# Patient Record
Sex: Female | Born: 1960 | Race: White | Hispanic: No | Marital: Married | Smoking: Never smoker
Health system: Southern US, Community
[De-identification: ages and names within clinical notes are randomized; demographics above are authoritative.]

## PROBLEM LIST (undated history)

## (undated) DIAGNOSIS — L509 Urticaria, unspecified: Secondary | ICD-10-CM

## (undated) DIAGNOSIS — G4733 Obstructive sleep apnea (adult) (pediatric): Secondary | ICD-10-CM

## (undated) DIAGNOSIS — F32A Depression, unspecified: Secondary | ICD-10-CM

## (undated) DIAGNOSIS — E785 Hyperlipidemia, unspecified: Secondary | ICD-10-CM

## (undated) DIAGNOSIS — E079 Disorder of thyroid, unspecified: Secondary | ICD-10-CM

## (undated) DIAGNOSIS — M419 Scoliosis, unspecified: Secondary | ICD-10-CM

## (undated) DIAGNOSIS — F319 Bipolar disorder, unspecified: Secondary | ICD-10-CM

## (undated) DIAGNOSIS — Z8739 Personal history of other diseases of the musculoskeletal system and connective tissue: Secondary | ICD-10-CM

## (undated) DIAGNOSIS — K219 Gastro-esophageal reflux disease without esophagitis: Secondary | ICD-10-CM

## (undated) DIAGNOSIS — M1990 Unspecified osteoarthritis, unspecified site: Secondary | ICD-10-CM

## (undated) DIAGNOSIS — E559 Vitamin D deficiency, unspecified: Secondary | ICD-10-CM

## (undated) DIAGNOSIS — C801 Malignant (primary) neoplasm, unspecified: Secondary | ICD-10-CM

## (undated) DIAGNOSIS — T7840XA Allergy, unspecified, initial encounter: Secondary | ICD-10-CM

## (undated) DIAGNOSIS — M8580 Other specified disorders of bone density and structure, unspecified site: Secondary | ICD-10-CM

## (undated) HISTORY — DX: Other specified disorders of bone density and structure, unspecified site: M85.80

## (undated) HISTORY — DX: Obstructive sleep apnea (adult) (pediatric): G47.33

## (undated) HISTORY — DX: Unspecified osteoarthritis, unspecified site: M19.90

## (undated) HISTORY — DX: Hyperlipidemia, unspecified: E78.5

## (undated) HISTORY — DX: Gastro-esophageal reflux disease without esophagitis: K21.9

## (undated) HISTORY — DX: Scoliosis, unspecified: M41.9

## (undated) HISTORY — DX: Depression, unspecified: F32.A

## (undated) HISTORY — DX: Personal history of other diseases of the musculoskeletal system and connective tissue: Z87.39

## (undated) HISTORY — PX: ENDOSCOPIC PLANTAR FASCIOTOMY: SUR443

## (undated) HISTORY — DX: Disorder of thyroid, unspecified: E07.9

## (undated) HISTORY — DX: Urticaria, unspecified: L50.9

## (undated) HISTORY — DX: Allergy, unspecified, initial encounter: T78.40XA

## (undated) HISTORY — DX: Vitamin D deficiency, unspecified: E55.9

## (undated) HISTORY — PX: BREAST BIOPSY: SHX20

## (undated) HISTORY — DX: Bipolar disorder, unspecified: F31.9

## (undated) HISTORY — PX: WISDOM TOOTH EXTRACTION: SHX21

## (undated) HISTORY — PX: LYMPH NODE BIOPSY: SHX201

## (undated) HISTORY — DX: Malignant (primary) neoplasm, unspecified: C80.1

---

## 2000-05-27 HISTORY — PX: CARPAL TUNNEL RELEASE: SHX101

## 2004-05-30 ENCOUNTER — Ambulatory Visit (HOSPITAL_BASED_OUTPATIENT_CLINIC_OR_DEPARTMENT_OTHER): Admission: RE | Admit: 2004-05-30 | Discharge: 2004-05-30 | Payer: Self-pay | Admitting: Orthopedic Surgery

## 2008-07-26 HISTORY — PX: COLONOSCOPY: SHX174

## 2013-03-17 HISTORY — PX: ESOPHAGOGASTRODUODENOSCOPY: SHX1529

## 2015-10-31 DIAGNOSIS — C9110 Chronic lymphocytic leukemia of B-cell type not having achieved remission: Secondary | ICD-10-CM | POA: Insufficient documentation

## 2015-11-29 DIAGNOSIS — Z803 Family history of malignant neoplasm of breast: Secondary | ICD-10-CM

## 2015-11-29 DIAGNOSIS — Z807 Family history of other malignant neoplasms of lymphoid, hematopoietic and related tissues: Secondary | ICD-10-CM

## 2015-11-29 DIAGNOSIS — C8300 Small cell B-cell lymphoma, unspecified site: Secondary | ICD-10-CM

## 2015-11-29 DIAGNOSIS — Z8042 Family history of malignant neoplasm of prostate: Secondary | ICD-10-CM

## 2015-11-29 DIAGNOSIS — Z808 Family history of malignant neoplasm of other organs or systems: Secondary | ICD-10-CM

## 2015-12-06 DIAGNOSIS — C8302 Small cell B-cell lymphoma, intrathoracic lymph nodes: Secondary | ICD-10-CM | POA: Diagnosis not present

## 2016-03-08 DIAGNOSIS — C8302 Small cell B-cell lymphoma, intrathoracic lymph nodes: Secondary | ICD-10-CM | POA: Diagnosis not present

## 2016-03-08 DIAGNOSIS — B373 Candidiasis of vulva and vagina: Secondary | ICD-10-CM

## 2016-03-08 DIAGNOSIS — Z87440 Personal history of urinary (tract) infections: Secondary | ICD-10-CM | POA: Diagnosis not present

## 2016-06-10 DIAGNOSIS — C8302 Small cell B-cell lymphoma, intrathoracic lymph nodes: Secondary | ICD-10-CM | POA: Diagnosis not present

## 2017-01-24 DIAGNOSIS — C8302 Small cell B-cell lymphoma, intrathoracic lymph nodes: Secondary | ICD-10-CM | POA: Diagnosis not present

## 2017-01-24 DIAGNOSIS — Z853 Personal history of malignant neoplasm of breast: Secondary | ICD-10-CM | POA: Diagnosis not present

## 2017-06-20 ENCOUNTER — Other Ambulatory Visit: Payer: Self-pay | Admitting: Internal Medicine

## 2017-06-20 DIAGNOSIS — R748 Abnormal levels of other serum enzymes: Secondary | ICD-10-CM

## 2017-06-25 ENCOUNTER — Ambulatory Visit
Admission: RE | Admit: 2017-06-25 | Discharge: 2017-06-25 | Disposition: A | Discharge status: Home / Self Care | Payer: 59 | Source: Ambulatory Visit | Attending: Internal Medicine | Admitting: Internal Medicine

## 2017-06-25 DIAGNOSIS — R748 Abnormal levels of other serum enzymes: Secondary | ICD-10-CM

## 2017-10-10 DIAGNOSIS — R945 Abnormal results of liver function studies: Secondary | ICD-10-CM | POA: Diagnosis not present

## 2017-10-10 DIAGNOSIS — Z803 Family history of malignant neoplasm of breast: Secondary | ICD-10-CM | POA: Diagnosis not present

## 2017-10-10 DIAGNOSIS — C8302 Small cell B-cell lymphoma, intrathoracic lymph nodes: Secondary | ICD-10-CM | POA: Diagnosis not present

## 2018-04-08 DIAGNOSIS — C8302 Small cell B-cell lymphoma, intrathoracic lymph nodes: Secondary | ICD-10-CM

## 2018-04-08 DIAGNOSIS — R945 Abnormal results of liver function studies: Secondary | ICD-10-CM

## 2018-04-08 DIAGNOSIS — D72819 Decreased white blood cell count, unspecified: Secondary | ICD-10-CM

## 2018-04-08 DIAGNOSIS — Z1501 Genetic susceptibility to malignant neoplasm of breast: Secondary | ICD-10-CM

## 2018-10-28 DIAGNOSIS — C8302 Small cell B-cell lymphoma, intrathoracic lymph nodes: Secondary | ICD-10-CM

## 2019-09-30 DIAGNOSIS — C8302 Small cell B-cell lymphoma, intrathoracic lymph nodes: Secondary | ICD-10-CM

## 2020-01-13 DIAGNOSIS — C8302 Small cell B-cell lymphoma, intrathoracic lymph nodes: Secondary | ICD-10-CM

## 2020-05-11 ENCOUNTER — Telehealth: Payer: Self-pay | Admitting: Oncology

## 2020-05-11 NOTE — Telephone Encounter (Signed)
05/11/20 spoke with patient and rescheduled her appt.

## 2020-06-14 ENCOUNTER — Other Ambulatory Visit: Payer: 59

## 2020-06-14 ENCOUNTER — Ambulatory Visit: Payer: 59 | Admitting: Oncology

## 2020-06-16 NOTE — Progress Notes (Signed)
[facility]  [address] [place],  [place]  [address] [phone]  Clinic Day:  06/20/2020  Referring physician: Ernestene Kiel, MD   This document serves as a record of services personally performed by Hosie Poisson, MD. It was created on their behalf by Curry,Lauren E, a trained medical scribe. The creation of this record is based on the scribe's personal observations and the provider's statements to them.   CHIEF COMPLAINT:  CC: Clinical stage IIIB small lymphocytic lymphoma  Current Treatment:  Surveillance   HISTORY OF PRESENT ILLNESS:  Dawn Prince is a 60 y.o. 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  Staging CT chest revealed adenopathy of the bilateral neck measuring up to 18 mm in diameter with left subclavian, bilateral axillary and subpectoral adenopathy, but no mediastinal nodes.  CT abdomen was  negative.  CT pelvis revealed bilateral external iliac nodes up to 11 mm in diameter.  She had B symptoms with severe night sweats and weight loss.  She has been on  observation only.  Due to her family history of breast cancer, she underwent testing for hereditary breast and ovarian cancer with the [facility] myRisk Hereditary Cancer Gene panel test.  This did not reveal any clinically significant mutation.  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  Her [facility] breast cancer risk assessment showed her lifetime risk of breast cancer to be 29.7%, so annual breast MRI, in addition to mammogram is recommended.  Mammogram and MRI breast done in August 2019 did not reveal any evidence of malignancy.  She did go for a second opinion to [facility] regarding her lymphoma and they concurred with the approach of watchful waiting.   She presented to the emergency room in mid April 2021 due to increased urinary frequency and discomfort.  CT imaging revealed left sided  obstructive uropathy with 3 mm calculus in the distal left ureter just proximal to the ureterovesical unction causing moderate hydroureteronephrosis and perinephric stranding.  Left greater than right iliac and pelvic lymphadenopathy, slightly greater than on 10/26/18, consistent with known history of lymphoma.  We did not repeat a scan in June as scheduled.   She is here for routine follow up and states that she lost her father back in July, from metastatic melanoma.  Around that time, she started to experience numbness from her hips down.  This improved, but she continued to have numbness of her feet for few weeks following.  Now she has constant pain of her hips, and has been using Ibuprofen to treat.  She will follow up with her primary care provider.  Recently her mother, who has dementia, had a fall and broke her kneecap, so she has been caring for her.  She plans to move her mother in with her in the next couple of months.  She is managing her anxiety and depression.  She does note fatigue.  Annual screening bilateral mammogram from August 17th was clear.  Her white count has increased from 3.0 to 3.6 with an [place] of 1910, and her hemoglobin and platelets are normal.  Chemistries are unremarkable except for a BUN of 21.  Her appetite is good, and she has gained 2 pounds since her last visit.  She denies fever or chills.  She denies nausea, vomiting, bowel issues, or abdominal pain.  She denies sore throat, cough, dyspnea, or chest pain.  INTERVAL HISTORY:  Dawn Prince is here  for routine follow up and states that she has been well.  She does feel that the adenopathy of the left neck has mildly worsened, but otherwise is stable.  She continues to have night sweats and notes chronic fatigue.  Her white count is stable at 3.6, but the [place] has mildly worsened to 1690, and the lymphocyte percentage has also increased to 45%.  Her hemoglobin and platelets are normal.  Chemistries are unremarkable except for a  mildly elevated potassium of 5.3.  Her  appetite is good, and her weight is stable since her last visit.  She denies fever, chills or other signs of infection.  She denies nausea, vomiting, bowel issues, or abdominal pain.  She denies sore throat, cough, dyspnea, or chest pain.  REVIEW OF SYSTEMS:  Review of Systems  Constitutional: Positive for fatigue.  HENT:  Negative.   Eyes: Negative.   Respiratory: Negative.   Cardiovascular: Negative.   Gastrointestinal: Negative.   Endocrine:       Night sweats  Genitourinary: Negative.    Musculoskeletal: Negative.   Skin: Negative.   Neurological: Negative.   Hematological: Positive for adenopathy (left neck lymph nodes have mildly increased).  Psychiatric/Behavioral: Negative.      VITALS:  Blood pressure 129/82, pulse 83, temperature 98.4 F (36.9 C), resp. rate 16, height 5' 1"  (1.549 m), weight 139 lb 11.2 oz (63.4 kg), SpO2 96 %.  Wt Readings from Last 3 Encounters:  06/20/20 139 lb 11.2 oz (63.4 kg)    Body mass index is 26.4 kg/m.  Performance status (ECOG): 1 - Symptomatic but completely ambulatory  PHYSICAL EXAM:  Physical Exam Constitutional:      General: She is not in acute distress.    Appearance: Normal appearance. She is normal weight.  HENT:     Head: Normocephalic and atraumatic.  Eyes:     General: No scleral icterus.    Extraocular Movements: Extraocular movements intact.     Conjunctiva/sclera: Conjunctivae normal.     Pupils: Pupils are equal, round, and reactive to light.  Cardiovascular:     Rate and Rhythm: Normal rate and regular rhythm.     Pulses: Normal pulses.     Heart sounds: Normal heart sounds. No murmur heard. No friction rub. No gallop.   Pulmonary:     Effort: Pulmonary effort is normal. No respiratory distress.     Breath sounds: Normal breath sounds.  Abdominal:     General: Bowel sounds are normal. There is no distension.     Palpations: Abdomen is soft. There is no mass.      Tenderness: There is no abdominal tenderness.  Musculoskeletal:        General: Normal range of motion.     Cervical back: Normal range of motion and neck supple.     Right lower leg: No edema.     Left lower leg: No edema.  Lymphadenopathy:     Cervical: No cervical adenopathy.     Comments: Several small rubbery lymph noes in the left supraclavicular fossa up to 2 cm in diameter.  Posterior cervical on the left is stable at 1.5 cm.  Left inguinal adenopathy several cm long.   Skin:    General: Skin is warm and dry.  Neurological:     General: No focal deficit present.     Mental Status: She is alert and oriented to person, place, and time. Mental status is at baseline.  Psychiatric:        Mood  and Affect: Mood normal.        Behavior: Behavior normal.        Thought Content: Thought content normal.        Judgment: Judgment normal.     LABS:  No flowsheet data found. No flowsheet data found.   No results found for: LDH   STUDIES:  No results found.   Allergies: Not on File  Current Medications: No current outpatient medications on file.   No current facility-administered medications for this visit.     ASSESSMENT & PLAN:   Assessment:   1. Small lymphocytic low-grade lymphoma, diagnosed in June 2017.  She remains fairly stable on observation only.  We do not recommend treatment at this time as she remains largely asymptomatic.  2. Borderline leukopenia, which fluctuates up and down. B12 and folate levels were both normal.  3. Elevated risk of breast cancer over 29% due to personal and family history.  We have recommended annual MRI breasts in addition to annual mammography, but we will hold off for now in view of her other circumstances.  4. Variant of uncertain significance of RAD51C.  We received an amended report from [employer] reclassifying this variant of uncertain significance as not pathogenic, i.e. not shown to increase her risk of  cancer, so this will no longer need to be followed.    5.  Mildly elevated potassium, I gave her a list of foods to avoid.  Plan: Her lymphadenopathy is fairly stable with mild increase of the left neck adenopathy and she remains asymptomatic, she will not require treatment at this time.  However, her lymphocytes have increased now at 45%, and so she may be transitioning to a CLL.  Treatment was mentioned for both diseases, but the plan would depend on her diagnosis.  She is still likely many years away from requiring treatment.  She knows to cut back on potassium rich foods, and I gave her a list of foods to avoid.  We will see her back in 4 months with CBC, CMP, LDH, and CT neck, chest, abdomen and pelvis for examination.  The patient and her husband understand the plans discussed today and are in agreement with them.  They know to contact our office if she develops concerns regarding her lymphoma prior to her next appointment.     I provided 30 minutes of face-to-face time during this this encounter and > 50% was spent counseling as documented under my assessment and plan.    Derwood Kaplan, MD [facility] [address] [place] [address] Dept: [phone] Dept Fax: [phone]   I, Rita Ohara, am acting as scribe for Derwood Kaplan, MD  I have reviewed this report as typed by the medical scribe, and it is complete and accurate.

## 2020-06-20 ENCOUNTER — Inpatient Hospital Stay (INDEPENDENT_AMBULATORY_CARE_PROVIDER_SITE_OTHER): Payer: 59 | Admitting: Oncology

## 2020-06-20 ENCOUNTER — Encounter: Payer: Self-pay | Admitting: Oncology

## 2020-06-20 ENCOUNTER — Other Ambulatory Visit: Payer: Self-pay | Admitting: Hematology and Oncology

## 2020-06-20 ENCOUNTER — Inpatient Hospital Stay: Payer: 59 | Attending: Oncology

## 2020-06-20 ENCOUNTER — Other Ambulatory Visit: Payer: Self-pay

## 2020-06-20 ENCOUNTER — Telehealth: Payer: Self-pay | Admitting: Oncology

## 2020-06-20 DIAGNOSIS — M5430 Sciatica, unspecified side: Secondary | ICD-10-CM

## 2020-06-20 DIAGNOSIS — E785 Hyperlipidemia, unspecified: Secondary | ICD-10-CM | POA: Insufficient documentation

## 2020-06-20 DIAGNOSIS — E559 Vitamin D deficiency, unspecified: Secondary | ICD-10-CM | POA: Insufficient documentation

## 2020-06-20 DIAGNOSIS — K219 Gastro-esophageal reflux disease without esophagitis: Secondary | ICD-10-CM | POA: Diagnosis not present

## 2020-06-20 DIAGNOSIS — Z8739 Personal history of other diseases of the musculoskeletal system and connective tissue: Secondary | ICD-10-CM | POA: Insufficient documentation

## 2020-06-20 DIAGNOSIS — C8302 Small cell B-cell lymphoma, intrathoracic lymph nodes: Secondary | ICD-10-CM | POA: Diagnosis not present

## 2020-06-20 DIAGNOSIS — C8299 Follicular lymphoma, unspecified, extranodal and solid organ sites: Secondary | ICD-10-CM

## 2020-06-20 DIAGNOSIS — E039 Hypothyroidism, unspecified: Secondary | ICD-10-CM | POA: Insufficient documentation

## 2020-06-20 DIAGNOSIS — M8580 Other specified disorders of bone density and structure, unspecified site: Secondary | ICD-10-CM | POA: Insufficient documentation

## 2020-06-20 LAB — CBC
MCV: 100 — AB (ref 81–99)
RBC: 6.7 — AB (ref 3.87–5.11)

## 2020-06-20 LAB — BASIC METABOLIC PANEL
BUN: 17 (ref 4–21)
CO2: 29 — AB (ref 13–22)
Chloride: 106 (ref 99–108)
Creatinine: 0.9 (ref 0.5–1.1)
Glucose: 111
Potassium: 5.3 (ref 3.4–5.3)
Sodium: 140 (ref 137–147)

## 2020-06-20 LAB — CBC AND DIFFERENTIAL
HCT: 37 (ref 36–46)
Hemoglobin: 12.4 (ref 12.0–16.0)
Neutrophils Absolute: 1.69
Platelets: 218 (ref 150–399)
WBC: 3.6

## 2020-06-20 LAB — COMPREHENSIVE METABOLIC PANEL
Albumin: 4 (ref 3.5–5.0)
Calcium: 9.2 (ref 8.7–10.7)

## 2020-06-20 LAB — HEPATIC FUNCTION PANEL
ALT: 31 (ref 7–35)
AST: 32 (ref 13–35)
Alkaline Phosphatase: 61 (ref 25–125)
Bilirubin, Total: 0.3

## 2020-06-20 NOTE — Telephone Encounter (Signed)
Per 1/25 LOS, patient's Follow Up scheduled for May w/Melissa  Pt will be scheduled for Labs, CT Scans after PreAuthorization received in Apt  Gave patient May Calendar of Follow Up Appt

## 2020-06-21 ENCOUNTER — Telehealth: Payer: Self-pay | Admitting: *Deleted

## 2020-06-21 NOTE — Telephone Encounter (Signed)
Pt received all pfizer vaccines at [facility] the pt had 1st dose on 07-29-19, 2nd dose on 08-17-2019 and booster on 03-30-20

## 2020-08-09 ENCOUNTER — Encounter: Payer: Self-pay | Admitting: Gastroenterology

## 2020-08-21 ENCOUNTER — Ambulatory Visit (AMBULATORY_SURGERY_CENTER): Payer: Self-pay

## 2020-08-21 ENCOUNTER — Other Ambulatory Visit: Payer: Self-pay

## 2020-08-21 VITALS — Ht 61.0 in | Wt 135.0 lb

## 2020-08-21 DIAGNOSIS — Z1211 Encounter for screening for malignant neoplasm of colon: Secondary | ICD-10-CM

## 2020-08-21 MED ORDER — NA SULFATE-K SULFATE-MG SULF 17.5-3.13-1.6 GM/177ML PO SOLN: 1.0000 | Freq: Once | ORAL | 0 refills | Status: AC

## 2020-08-21 NOTE — Progress Notes (Signed)
No allergies to soy or egg Pt is not on blood thinners or diet pills Denies issues with sedation/intubation Denies atrial flutter/fib Denies constipation   Pt is aware of Covid safety and care partner requirements.      

## 2020-09-05 ENCOUNTER — Encounter: Payer: 59 | Admitting: Gastroenterology

## 2020-09-28 ENCOUNTER — Other Ambulatory Visit: Payer: Self-pay | Admitting: Hematology and Oncology

## 2020-09-28 DIAGNOSIS — C8302 Small cell B-cell lymphoma, intrathoracic lymph nodes: Secondary | ICD-10-CM

## 2020-10-02 ENCOUNTER — Encounter: Payer: Self-pay | Admitting: Gastroenterology

## 2020-10-03 ENCOUNTER — Encounter: Payer: Self-pay | Admitting: Gastroenterology

## 2020-10-03 ENCOUNTER — Other Ambulatory Visit: Payer: Self-pay | Admitting: Gastroenterology

## 2020-10-03 ENCOUNTER — Other Ambulatory Visit: Payer: Self-pay

## 2020-10-03 ENCOUNTER — Ambulatory Visit (AMBULATORY_SURGERY_CENTER): Payer: Managed Care, Other (non HMO) | Admitting: Gastroenterology

## 2020-10-03 VITALS — BP 144/79 | HR 73 | Temp 97.6°F | Resp 17 | Ht 61.0 in | Wt 135.0 lb

## 2020-10-03 DIAGNOSIS — Z1211 Encounter for screening for malignant neoplasm of colon: Secondary | ICD-10-CM | POA: Diagnosis present

## 2020-10-03 DIAGNOSIS — D124 Benign neoplasm of descending colon: Secondary | ICD-10-CM

## 2020-10-03 MED ORDER — SODIUM CHLORIDE 0.9 % IV SOLN: 500.0000 mL | Freq: Once | INTRAVENOUS | Status: DC

## 2020-10-03 NOTE — Progress Notes (Signed)
VS by CW  Pt's states no medical or surgical changes since previsit or office visit.  

## 2020-10-03 NOTE — Progress Notes (Signed)
Called to room to assist during endoscopic procedure.  Patient ID and intended procedure confirmed with present staff. Received instructions for my participation in the procedure from the performing physician.  

## 2020-10-03 NOTE — Op Note (Signed)
[facility] Patient Name: Dawn Prince Procedure Date: 10/03/2020 1:56 PM MRN: [id number] Endoscopist: Jackquline Denmark , MD Age: 60 Referring MD:  Date of Birth: 04-22-1961 Gender: Female Account #: [id number] Procedure:                Colonoscopy Indications:              Screening for colorectal malignant neoplasm Medicines:                Monitored Anesthesia Care Procedure:                Pre-Anesthesia Assessment:                           - Prior to the procedure, a History and Physical                            was performed, and patient medications and                            allergies were reviewed. The patient's tolerance of                            previous anesthesia was also reviewed. The risks                            and benefits of the procedure and the sedation                            options and risks were discussed with the patient.                            All questions were answered, and informed consent                            was obtained. Prior Anticoagulants: The patient has                            taken no previous anticoagulant or antiplatelet                            agents. ASA Grade Assessment: II - A patient with                            mild systemic disease. After reviewing the risks                            and benefits, the patient was deemed in                            satisfactory condition to undergo the procedure.                           After obtaining informed consent, the colonoscope  was passed under direct vision. Throughout the                            procedure, the patient's blood pressure, pulse, and                            oxygen saturations were monitored continuously. The                            Olympus PCF-H190DL ([id number]) Colonoscope was                            introduced through the anus and advanced to the 2                            cm into the ileum.  The colonoscopy was performed                            without difficulty. The patient tolerated the                            procedure well. The quality of the bowel                            preparation was good. The terminal ileum, ileocecal                            valve, appendiceal orifice, and rectum were                            photographed. Scope In: 2:06:41 PM Scope Out: 2:19:54 PM Scope Withdrawal Time: 0 hours 10 minutes 11 seconds  Total Procedure Duration: 0 hours 13 minutes 13 seconds  Findings:                 A 6 mm polyp was found in the mid descending colon.                            The polyp was sessile. The polyp was removed with a                            cold snare. Resection and retrieval were complete.                           A few rare (1-2) small-mouthed diverticula were                            found in the sigmoid colon.                           Non-bleeding internal hemorrhoids were found during                            retroflexion. The hemorrhoids were small.  The terminal ileum appeared normal.                           The exam was otherwise without abnormality on                            direct and retroflexion views. Complications:            No immediate complications. Estimated Blood Loss:     Estimated blood loss: none. Impression:               - One 6 mm polyp in the mid descending colon,                            removed with a cold snare. Resected and retrieved.                           - Very minimal sigmoid diverticulosis.                           - The examined portion of the ileum was normal.                           - The examination was otherwise normal on direct                            and retroflexion views. Recommendation:           - Patient has a contact number available for                            emergencies. The signs and symptoms of potential                             delayed complications were discussed with the                            patient. Return to normal activities tomorrow.                            Written discharge instructions were provided to the                            patient.                           - Resume previous diet.                           - Continue present medications.                           - Await pathology results.                           - Repeat colonoscopy for surveillance based on  pathology results.                           - The findings and recommendations were discussed                            with the patient's husband Elta Guadeloupe. Jackquline Denmark, MD 10/03/2020 2:23:36 PM This report has been signed electronically.

## 2020-10-03 NOTE — Progress Notes (Signed)
1405 Patient experiencing nausea and vomiting.  MD updated and Zofran 4 mg IV given, vss  

## 2020-10-03 NOTE — Patient Instructions (Signed)
Handouts provided:  Polyps and Diverticulosis  YOU HAD AN ENDOSCOPIC PROCEDURE TODAY AT THE [facility]:   Refer to the procedure report that was given to you for any specific questions about what was found during the examination.  If the procedure report does not answer your questions, please call your gastroenterologist to clarify.  If you requested that your care partner not be given the details of your procedure findings, then the procedure report has been included in a sealed envelope for you to review at your convenience later.  YOU SHOULD EXPECT: Some feelings of bloating in the abdomen. Passage of more gas than usual.  Walking can help get rid of the air that was put into your GI tract during the procedure and reduce the bloating. If you had a lower endoscopy (such as a colonoscopy or flexible sigmoidoscopy) you may notice spotting of blood in your stool or on the toilet paper. If you underwent a bowel prep for your procedure, you may not have a normal bowel movement for a few days.  Please Note:  You might notice some irritation and congestion in your nose or some drainage.  This is from the oxygen used during your procedure.  There is no need for concern and it should clear up in a day or so.  SYMPTOMS TO REPORT IMMEDIATELY:  Following lower endoscopy (colonoscopy or flexible sigmoidoscopy):  Excessive amounts of blood in the stool  Significant tenderness or worsening of abdominal pains  Swelling of the abdomen that is new, acute  Fever of 100F or higher  For urgent or emergent issues, a gastroenterologist can be reached at any hour by calling [phone]. Do not use MyChart messaging for urgent concerns.    DIET:  We do recommend a small meal at first, but then you may proceed to your regular diet.  Drink plenty of fluids but you should avoid alcoholic beverages for 24 hours.  ACTIVITY:  You should plan to take it easy for the rest of today and you should NOT DRIVE  or use heavy machinery until tomorrow (because of the sedation medicines used during the test).    FOLLOW UP: Our staff will call the number listed on your records 48-72 hours following your procedure to check on you and address any questions or concerns that you may have regarding the information given to you following your procedure. If we do not reach you, we will leave a message.  We will attempt to reach you two times.  During this call, we will ask if you have developed any symptoms of COVID 19. If you develop any symptoms (ie: fever, flu-like symptoms, shortness of breath, cough etc.) before then, please call [phone].  If you test positive for Covid 19 in the 2 weeks post procedure, please call and report this information to us.    If any biopsies were taken you will be contacted by phone or by letter within the next 1-3 weeks.  Please call us at [phone] if you have not heard about the biopsies in 3 weeks.    SIGNATURES/CONFIDENTIALITY: You and/or your care partner have signed paperwork which will be entered into your electronic medical record.  These signatures attest to the fact that that the information above on your After Visit Summary has been reviewed and is understood.  Full responsibility of the confidentiality of this discharge information lies with you and/or your care-partner.  

## 2020-10-03 NOTE — Progress Notes (Signed)
Report given to PACU, vss 

## 2020-10-05 ENCOUNTER — Telehealth: Payer: Self-pay | Admitting: *Deleted

## 2020-10-05 NOTE — Telephone Encounter (Signed)
1. Have you developed a fever since your procedure? no  2.   Have you had an respiratory symptoms (SOB or cough) since your procedure? no  3.   Have you tested positive for COVID 19 since your procedure no  4.   Have you had any family members/close contacts diagnosed with the COVID 19 since your procedure?  no   If yes to any of these questions please route to Joylene John, RN and Joella Prince, RN Follow up Call-  Call back number 10/03/2020  Post procedure Call Back phone  # [phone]  Permission to leave phone message Yes  Some recent data might be hidden     Patient questions:  Do you have a fever, pain , or abdominal swelling? No. Pain Score  0 *  Have you tolerated food without any problems? Yes.    Have you been able to return to your normal activities? Yes.    Do you have any questions about your discharge instructions: Diet   No. Medications  No. Follow up visit  No.  Do you have questions or concerns about your Care? No.  Actions: * If pain score is 4 or above: No action needed, pain <4.

## 2020-10-11 ENCOUNTER — Ambulatory Visit: Payer: 59 | Admitting: Hematology and Oncology

## 2020-10-11 LAB — CBC: RBC: 3.64 — AB (ref 3.87–5.11)

## 2020-10-11 LAB — CBC AND DIFFERENTIAL
HCT: 36 (ref 36–46)
Hemoglobin: 12.1 (ref 12.0–16.0)
Neutrophils Absolute: 1.4
Platelets: 199 (ref 150–399)
WBC: 3.1

## 2020-10-11 LAB — HEPATIC FUNCTION PANEL
ALT: 31 (ref 7–35)
AST: 32 (ref 13–35)
Alkaline Phosphatase: 69 (ref 25–125)
Bilirubin, Total: 0.3

## 2020-10-11 LAB — BASIC METABOLIC PANEL
BUN: 12 (ref 4–21)
CO2: 30 — AB (ref 13–22)
Chloride: 104 (ref 99–108)
Creatinine: 0.7 (ref 0.5–1.1)
Glucose: 107
Potassium: 5.1 (ref 3.4–5.3)
Sodium: 139 (ref 137–147)

## 2020-10-11 LAB — COMPREHENSIVE METABOLIC PANEL
Albumin: 4.3 (ref 3.5–5.0)
Calcium: 9.3 (ref 8.7–10.7)

## 2020-10-12 ENCOUNTER — Inpatient Hospital Stay: Payer: Managed Care, Other (non HMO) | Admitting: Hematology and Oncology

## 2020-10-13 ENCOUNTER — Encounter: Payer: Self-pay | Admitting: Hematology and Oncology

## 2020-10-13 ENCOUNTER — Telehealth: Payer: Self-pay | Admitting: Hematology and Oncology

## 2020-10-13 ENCOUNTER — Other Ambulatory Visit: Payer: Self-pay

## 2020-10-13 ENCOUNTER — Inpatient Hospital Stay: Payer: Managed Care, Other (non HMO) | Attending: Hematology and Oncology | Admitting: Hematology and Oncology

## 2020-10-13 VITALS — BP 115/74 | HR 95 | Temp 97.9°F | Resp 18 | Ht 61.0 in | Wt 129.4 lb

## 2020-10-13 DIAGNOSIS — C8302 Small cell B-cell lymphoma, intrathoracic lymph nodes: Secondary | ICD-10-CM | POA: Diagnosis not present

## 2020-10-13 LAB — LACTATE DEHYDROGENASE: LDH: 458

## 2020-10-13 NOTE — Telephone Encounter (Signed)
PER 5/20 LOS NEXT APPT SCHEDULING AND GIVEN TO PATIENT

## 2020-10-13 NOTE — Progress Notes (Signed)
[facility]  [address] [place],  [place]  [address] [phone]  Clinic Day:  10/13/2020  Referring physician: Ernestene Kiel, MD     CHIEF COMPLAINT:  CC: Clinical stage IIIB small lymphocytic lymphoma  Current Treatment:  Surveillance   HISTORY OF PRESENT ILLNESS:  Dawn Prince is a 60 y.o. 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  Staging CT chest revealed adenopathy of the bilateral neck measuring up to 18 mm in diameter with left subclavian, bilateral axillary and subpectoral adenopathy, but no mediastinal nodes.  CT abdomen was  negative.  CT pelvis revealed bilateral external iliac nodes up to 11 mm in diameter.  She had B symptoms with severe night sweats and weight loss.  She has been on  observation only.  Due to her family history of breast cancer, she underwent testing for hereditary breast and ovarian cancer with the [facility] myRisk Hereditary Cancer Gene panel test.  This did not reveal any clinically significant mutation.  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  Her [facility] breast cancer risk assessment showed her lifetime risk of breast cancer to be 29.7%, so annual breast MRI, in addition to mammogram is recommended.  Mammogram and MRI breast done in August 2019 did not reveal any evidence of malignancy.  She did go for a second opinion to [facility] regarding her lymphoma and they concurred with the approach of watchful waiting.   She presented to the emergency room in mid April 2021 due to increased urinary frequency and discomfort.  CT imaging revealed left sided obstructive uropathy with 3 mm calculus in the distal left ureter just proximal to the ureterovesical unction causing moderate hydroureteronephrosis and perinephric stranding.  Left greater than right iliac and pelvic lymphadenopathy, slightly greater than on 10/26/18, consistent with  known history of lymphoma.  We did not repeat a scan in June as scheduled.   She is here for routine follow up and states that she lost her father back in July, from metastatic melanoma.  Around that time, she started to experience numbness from her hips down.  This improved, but she continued to have numbness of her feet for few weeks following.  Now she has constant pain of her hips, and has been using Ibuprofen to treat.  She will follow up with her primary care provider.  Recently her mother, who has dementia, had a fall and broke her kneecap, so she has been caring for her.  She plans to move her mother in with her in the next couple of months.  She is managing her anxiety and depression.  She does note fatigue.  Annual screening bilateral mammogram from August 17th was clear.  Her white count has increased from 3.0 to 3.6 with an McDonald of 1910, and her hemoglobin and platelets are normal.  Chemistries are unremarkable except for a BUN of 21.  Her appetite is good, and she has gained 2 pounds since her last visit.  She denies fever or chills.  She denies nausea, vomiting, bowel issues, or abdominal pain.  She denies sore throat, cough, dyspnea, or chest pain.  INTERVAL HISTORY:  Dawn Prince is here for evaluation and states that she has been well.  She does feel that the adenopathy of the left neck has mildly worsened, but otherwise is stable. CT imaging of the neck proves this to be true with CT neck revealing Multiple lymph nodes in  the neck bilaterally, with mild progression of lymph nodes on the right. There has been more significant progression of left level 4 and left supraclavicular lymph nodes compared to the prior study. Largest lymph node in the left supraclavicular region measures 35 x 17 mm. Findings compatible with lymphoma. CT chest/ abdomen/ pelvis reveals  No substantial interval change in exam. Bilateral supraclavicular, subpectoral, axillary, retroperitoneal, and pelvic lymphadenopathy is  similar to prior. No definite findings of progression or improvement. Tiny hiatal hernia. She reports more fatigue, but is also caring for her mother who has dementia and requires full time care. She continues to have chronic pain in her legs and back and this is also worse since caring for her mother. She has gabapentin prescribed for three times daily, but only takes it twice daily.  She denies any worsening of night sweats. Her husband offers support, but cares for his mother and aunt as well. She denies fever, chills, nausea or vomiting. She denies shortness of breath, chest pain or cough. She denies issue with bowel or bladder. CBC reveals WBC 3.1 with ANC 1.4, decreased from last visit and lymphocytes 47.9%, increased from last visit. CMP reveals potassium 5.1 decreased from last visit.   REVIEW OF SYSTEMS:  Review of Systems  Constitutional: Positive for fatigue. Negative for appetite change, chills, diaphoresis, fever and unexpected weight change.  HENT:  Negative.  Negative for hearing loss, lump/mass, mouth sores, nosebleeds, sore throat, tinnitus, trouble swallowing and voice change.   Eyes: Negative.  Negative for eye problems and icterus.  Respiratory: Negative.  Negative for chest tightness, cough, hemoptysis, shortness of breath and wheezing.   Cardiovascular: Negative.  Negative for chest pain, leg swelling and palpitations.  Gastrointestinal: Negative.  Negative for abdominal distention, abdominal pain, blood in stool, constipation, diarrhea, nausea, rectal pain and vomiting.  Endocrine: Negative for hot flashes.       Night sweats  Genitourinary: Negative.  Negative for bladder incontinence, difficulty urinating, dyspareunia, dysuria, frequency, hematuria and nocturia.   Musculoskeletal: Positive for back pain and myalgias. Negative for arthralgias, flank pain, gait problem, neck pain and neck stiffness.       Bilateral leg pain  Skin: Negative.  Negative for itching, rash and wound.   Neurological: Negative.  Negative for dizziness, extremity weakness, gait problem, headaches, light-headedness, numbness, seizures and speech difficulty.  Hematological: Positive for adenopathy (left neck lymph nodes have mildly increased). Does not bruise/bleed easily.       Left sided cervical adenopathy greater than right.  Psychiatric/Behavioral: Negative.  Negative for confusion, decreased concentration, depression, sleep disturbance and suicidal ideas. The patient is not nervous/anxious.      VITALS:  Blood pressure 115/74, pulse 95, temperature 97.9 F (36.6 C), temperature source Oral, resp. rate 18, height 5' 1"  (1.549 m), weight 129 lb 6.4 oz (58.7 kg), SpO2 98 %.  Wt Readings from Last 3 Encounters:  10/13/20 129 lb 6.4 oz (58.7 kg)  10/03/20 135 lb (61.2 kg)  08/21/20 135 lb (61.2 kg)    Body mass index is 24.45 kg/m.  Performance status (ECOG): 1 - Symptomatic but completely ambulatory  PHYSICAL EXAM:  Physical Exam Constitutional:      General: She is not in acute distress.    Appearance: Normal appearance. She is normal weight. She is not ill-appearing, toxic-appearing or diaphoretic.  HENT:     Head: Normocephalic and atraumatic.     Nose: Nose normal. No congestion or rhinorrhea.     Mouth/Throat:  Mouth: Mucous membranes are moist.     Pharynx: Oropharynx is clear. No oropharyngeal exudate or posterior oropharyngeal erythema.  Eyes:     General: No scleral icterus.       Right eye: No discharge.        Left eye: No discharge.     Extraocular Movements: Extraocular movements intact.     Conjunctiva/sclera: Conjunctivae normal.     Pupils: Pupils are equal, round, and reactive to light.  Neck:     Vascular: No carotid bruit.  Cardiovascular:     Rate and Rhythm: Normal rate and regular rhythm.     Pulses: Normal pulses.     Heart sounds: Normal heart sounds. No murmur heard. No friction rub. No gallop.   Pulmonary:     Effort: Pulmonary effort is  normal. No respiratory distress.     Breath sounds: Normal breath sounds. No stridor. No wheezing, rhonchi or rales.  Chest:     Chest wall: No tenderness.  Abdominal:     General: Abdomen is flat. Bowel sounds are normal. There is no distension.     Palpations: Abdomen is soft. There is no mass.     Tenderness: There is no abdominal tenderness. There is no right CVA tenderness, left CVA tenderness, guarding or rebound.     Hernia: No hernia is present.  Musculoskeletal:        General: No swelling, tenderness, deformity or signs of injury. Normal range of motion.     Cervical back: Normal range of motion and neck supple. No rigidity or tenderness.     Right lower leg: No edema.     Left lower leg: No edema.  Lymphadenopathy:     Cervical: No cervical adenopathy.     Comments: Several small rubbery lymph nodes in the left supraclavicular fossa up to 3 cm in diameter.  Posterior cervical on the left is stable at 1.5 cm.  Left inguinal adenopathy several cm long.   Skin:    General: Skin is warm and dry.     Capillary Refill: Capillary refill takes less than 2 seconds.     Coloration: Skin is not jaundiced or pale.     Findings: No bruising, erythema, lesion or rash.  Neurological:     General: No focal deficit present.     Mental Status: She is alert and oriented to person, place, and time. Mental status is at baseline.     Cranial Nerves: No cranial nerve deficit.     Sensory: No sensory deficit.     Motor: No weakness.     Coordination: Coordination normal.     Gait: Gait normal.     Deep Tendon Reflexes: Reflexes normal.  Psychiatric:        Mood and Affect: Mood normal.        Behavior: Behavior normal.        Thought Content: Thought content normal.        Judgment: Judgment normal.     LABS:   CBC Latest Ref Rng & Units 10/11/2020 06/20/2020  WBC - 3.1 3.6  Hemoglobin 12.0 - 16.0 12.1 12.4  Hematocrit 36 - 46 36 37  Platelets 150 - 399 199 218   CMP Latest Ref Rng &  Units 10/11/2020 06/20/2020  BUN 4 - 21 12 17   Creatinine 0.5 - 1.1 0.7 0.9  Sodium 137 - 147 139 140  Potassium 3.4 - 5.3 5.1 5.3  Chloride 99 - 108 104 106  CO2 13 -  22 30(A) 29(A)  Calcium 8.7 - 10.7 9.3 9.2  Alkaline Phos 25 - 125 69 61  AST 13 - 35 32 32  ALT 7 - 35 31 31     Lab Results  Component Value Date   LDH 458 10/11/2020     STUDIES:  Exam(s): [id number] CT/CT NECK W/ CM CLINICAL DATA:  B-cell lymphoma  EXAM: CT NECK WITH CONTRAST  TECHNIQUE: Multidetector CT imaging of the neck was performed using the standard protocol following the bolus administration of intravenous contrast.  CONTRAST:  100 mL Isovue 370 IV  COMPARISON:  CT soft tissue neck 12/04/2015  FINDINGS: Pharynx and larynx: Normal. No mass or swelling.  Salivary glands: No inflammation, mass, or stone.  Thyroid: 10 mm right thyroid nodule with mild interval growth since the prior study. No further imaging based on size criteria.  Lymph nodes: Multiple lymph nodes in the neck bilaterally with progression since the prior study.  Right level 2 lymph node 8.1 mm has progressed. Right posterior lymph node 7 mm has progressed.  Left level 2 lymph node 7.5 mm similar to the prior study. Multiple subcentimeter posterior lymph nodes on the left, similar. Left supraclavicular lymph node mass measures 35 x 17 mm with progression. Adjacent left level 4 lymph node 12 mm with progression. High axillary node 11 mm has progressed.  Vascular: Normal vascular enhancement.  Limited intracranial: Negative  Visualized orbits: Negative  Mastoids and visualized paranasal sinuses: Paranasal sinuses clear. Mastoid clear.  Skeleton: Cervical spondylosis without acute abnormality.  Upper chest: Chest CT today reported separately.  Other: None  IMPRESSION: Multiple lymph nodes in the neck bilaterally, with mild progression of lymph nodes on the right.  There has been more significant progression  of left level 4 and left supraclavicular lymph nodes compared to the prior study. Largest lymph node in the left supraclavicular region measures 35 x 17 mm. Findings compatible with lymphoma.   Electronically Signed   By: Franchot Gallo M.D.   On: 10/12/2020 16:35  Electronically Signed By: Trinidad Curet MD  Electronically Signed Date/Time: 05/19/221638 Dictate Date/Time: 10/12/20 1628  Exam(s): [id number] CT/CT CHEST-ABD-PELV W/IV CM CLINICAL DATA:  B-cell lymphoma.  Restaging.  EXAM: CT CHEST, ABDOMEN, AND PELVIS WITH CONTRAST  TECHNIQUE: Multidetector CT imaging of the chest, abdomen and pelvis was performed following the standard protocol during bolus administration of intravenous contrast.  CONTRAST:  100 cc Isovue 370  COMPARISON:  CT urogram 09/07/2019. Chest abdomen pelvis CT 10/26/2018  FINDINGS: CT CHEST FINDINGS (compared to 10/26/2018)  Cardiovascular: The heart size is normal. No substantial pericardial effusion.  Mediastinum/Nodes: No mediastinal lymphadenopathy. There is no hilar lymphadenopathy. Tiny hiatal hernia. The esophagus has normal imaging features. Bilateral supraclavicular subpectoral and axillary lymphadenopathy again noted. Index left supraclavicular node measured previously at 10 mm short axis is 12 mm short axis on image 5/series 4 today. Index left axillary node measured previously at 13 mm short axis is stable at 13 mm short axis today (13/4). The right axillary index node measured previously at 13 mm short axis is 12 mm short axis today on image 12/series 4.  Lungs/Pleura: The lungs are clear without focal pneumonia, edema, pneumothorax or pleural effusion.  Musculoskeletal: No worrisome lytic or sclerotic osseous abnormality.  CT ABDOMEN PELVIS FINDINGS (compared to 09/07/2019)  Hepatobiliary: No suspicious focal abnormality within the liver parenchyma. There is no evidence for gallstones, gallbladder wall thickening,  or pericholecystic fluid. No intrahepatic or extrahepatic biliary dilation.  Pancreas: No  focal mass lesion. No dilatation of the main duct. No intraparenchymal cyst. No peripancreatic edema.  Spleen: No splenomegaly. No focal mass lesion.  Adrenals/Urinary Tract: No adrenal nodule or mass. Kidneys unremarkable. No evidence for hydroureter. Bladder is decompressed.  Stomach/Bowel: Tiny hiatal hernia. Stomach otherwise unremarkable. Duodenum is normally positioned as is the ligament of Treitz. No small bowel wall thickening. No small bowel dilatation. The terminal ileum is normal. The appendix is normal. No gross colonic mass. No colonic wall thickening.  Vascular/Lymphatic: No abdominal aortic aneurysm. Retroperitoneal lymphadenopathy noted. 9 mm short axis left para-aortic node on 69/4 was 9 mm short axis previously. Prominent left common iliac node measuring 13 mm short axis on 82/4 was 14 mm short axis previously. Index 13 mm right common iliac node on 100/4 was 12 mm previously. Contralateral external iliac node measuring 19 mm along the left pelvic sidewall today was 18 mm previously.  Reproductive: The uterus is unremarkable.  There is no adnexal mass.  Other: No intraperitoneal free fluid.  Musculoskeletal: No worrisome lytic or sclerotic osseous abnormality. Thoracolumbar scoliosis evident.  IMPRESSION: 1. No substantial interval change in exam. Bilateral supraclavicular, subpectoral, axillary, retroperitoneal, and pelvic lymphadenopathy is similar to prior. No definite findings of progression or improvement.. 2. Tiny hiatal hernia.   Electronically Signed   By: Misty Stanley M.D.   On: 10/13/2020 06:20  Electronically Signed By: Eugenie Norrie MD  Electronically Signed Date/Time: 05/20/220623 Dictate Date/Time: 10/13/20 0612     Allergies: No Known Allergies  Current Medications: Current Outpatient Medications  Medication Sig Dispense Refill  .  ascorbic acid (VITAMIN C) 500 MG tablet Take 1 tablet by mouth daily.    Marland Kitchen atorvastatin (LIPITOR) 20 MG tablet Take 20 mg by mouth daily.    Marland Kitchen buPROPion (WELLBUTRIN) 75 MG tablet bupropion HCl 75 mg tablet   1 tablet every day by oral route.    . Calcium Carb-Cholecalciferol (CALCIUM CARBONATE-VITAMIN D3 PO) Take by mouth.    . chlorpheniramine (CHLOR-TRIMETON) 4 MG tablet ChlorTabs 4 mg tablet   1 tablet every day by oral route.    Marland Kitchen ELDERBERRY PO Elderberry    . FLUoxetine (PROZAC) 20 MG capsule Take 60 mg by mouth daily.    Marland Kitchen gabapentin (NEURONTIN) 100 MG capsule Take 100 mg by mouth 3 (three) times daily as needed.    . INTRAROSA 6.5 MG INST insert 1 VAGINALLY EVERY DAY AT BEDTIME Vaginal] (Patient not taking: No sig reported)    . levothyroxine (SYNTHROID) 25 MCG tablet Take 25 mcg by mouth every morning.    . Magnesium 100 MG CAPS Take 1 capsule by mouth 2 (two) times daily. (Patient not taking: No sig reported)    . Misc Natural Products (METABO-STYLE PO) Take 1 tablet by mouth 2 (two) times daily.    . Misc Natural Products (SLENDER HERB DIET FORMULA PO) Take 1 tablet by mouth daily.    . Multiple Vitamin (MULTIVITAMIN ADULT PO) Take 1 capsule by mouth daily.    . Multiple Vitamins-Minerals (ZINC PO) zinc    . Omega-3 Fat Ac-Cholecalciferol (OMEGA ESSENTIALS/VIT D3) [id number] MG-UNIT CAPS Take 1 capsule by mouth daily.    . ondansetron (ZOFRAN-ODT) 4 MG disintegrating tablet ondansetron 4 mg disintegrating tablet  DISSOLVE 1 TABLET BY MOUTH EVERY 6 HOURS AS NEEDED FOR VOMITING    . Probiotic Product (PROBIOTIC BLEND PO) Probiotic    . QUEtiapine (SEROQUEL) 100 MG tablet Take 100 mg by mouth daily as needed.    . ramipril (  ALTACE) 1.25 MG capsule Take 1.25 mg by mouth daily.    . TURMERIC PO turmeric     No current facility-administered medications for this visit.     ASSESSMENT & PLAN:   Assessment:   1. Small lymphocytic low-grade lymphoma, diagnosed in June 2017.  She remains  fairly stable on observation only.  We do not recommend treatment at this time as she remains largely asymptomatic.  2. Borderline leukopenia, which fluctuates up and down. B12 and folate levels were both normal.  3. Elevated risk of breast cancer over 29% due to personal and family history.  We have recommended annual MRI breasts in addition to annual mammography, but we will hold off for now in view of her other circumstances.   Plan: Her lymphadenopathy is increased to the left neck yet she remains mostly asymptomatic. Her lymph percentage has increased slightly from 45% to 47.9%.  She continues to have fatigue, but she is under increased stress at home caring for her mother with dementia on a daily basis. We reviewed her CT imaging and labs in detail and she wishes to remain off of therapy at this time.  She will take her gabapentin three times daily as prescribed. She will check with her mother's PCP to see if there are any resources that she can take advantage of to assist her in the home with her mother. We will see her back in clinic in 3 months with repeat evaluation.   Both her and her husband verbalize understanding of and agreement to the plans discussed today. They know to call the office should any new questions or concerns arise.   Melodye Ped, NP [facility] [address] [place] [address] Dept: [phone] Dept Fax: [phone]

## 2020-10-16 ENCOUNTER — Encounter: Payer: Self-pay | Admitting: Hematology and Oncology

## 2020-10-17 ENCOUNTER — Encounter: Payer: Self-pay | Admitting: Gastroenterology

## 2021-01-12 ENCOUNTER — Telehealth: Payer: Self-pay | Admitting: Oncology

## 2021-01-12 NOTE — Telephone Encounter (Signed)
Patient called to rescheduled 8/23 Labs, Follow Up to 9/1 due to conflicting Appt's

## 2021-01-16 ENCOUNTER — Inpatient Hospital Stay: Payer: Managed Care, Other (non HMO)

## 2021-01-16 ENCOUNTER — Inpatient Hospital Stay: Payer: Managed Care, Other (non HMO) | Admitting: Oncology

## 2021-01-17 NOTE — Progress Notes (Signed)
[facility]  [address] [place],  [place]  [address] [phone]  Clinic Day:  01/25/2021  Referring physician: Ernestene Kiel, MD   This document serves as a record of services personally performed by Hosie Poisson, MD. It was created on their behalf by Curry,Lauren E, a trained medical scribe. The creation of this record is based on the scribe's personal observations and the provider's statements to them.  CHIEF COMPLAINT:  CC: Clinical stage IIIB small lymphocytic lymphoma  Current Treatment:  Surveillance   HISTORY OF PRESENT ILLNESS:  Dawn Prince is a 60 y.o. 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  Staging CT chest revealed adenopathy of the bilateral neck measuring up to 18 mm in diameter with left subclavian, bilateral axillary and subpectoral adenopathy, but no mediastinal nodes.  CT abdomen was  negative.  CT pelvis revealed bilateral external iliac nodes up to 11 mm in diameter.  Dawn Prince had B symptoms with severe night sweats and weight loss.  Dawn Prince has been on  observation only.  Due to her family history of breast cancer, Dawn Prince underwent testing for hereditary breast and ovarian cancer with the [facility] myRisk Hereditary Cancer Gene panel test.  This did not reveal any clinically significant mutation.  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  Her [facility] breast cancer risk assessment showed her lifetime risk of breast cancer to be 29.7%, so annual breast MRI, in addition to mammogram is recommended.  Mammogram and MRI breast done in August 2019 did not reveal any evidence of malignancy.  Dawn Prince did go for a second opinion to [facility] regarding her lymphoma and they concurred with the approach of watchful waiting.   Dawn Prince presented to the emergency room in mid April 2021 due to increased urinary frequency and discomfort.  CT imaging revealed left sided  obstructive uropathy with 3 mm calculus in the distal left ureter just proximal to the ureterovesical unction causing moderate hydroureteronephrosis and perinephric stranding.  Left greater than right iliac and pelvic lymphadenopathy, slightly greater than on 10/26/18, consistent with known history of lymphoma.  We did not repeat a scan in June as scheduled.  Annual screening bilateral mammogram from August 2021 was clear.    CT neck from May 2022 revealed multiple lymph nodes in the neck bilaterally, with mild progression of lymph nodes on the right. There has been more significant progression of left level 4 and left supraclavicular lymph nodes compared to the prior study. Largest lymph node in the left supraclavicular region measures 35 x 17 mm. Findings were compatible with lymphoma. CT chest/ abdomen/ pelvis revealed no substantial interval change in exam. Bilateral supraclavicular, subpectoral, axillary, retroperitoneal, and pelvic lymphadenopathy is similar to prior. No definite findings of progression or improvement.   INTERVAL HISTORY:  Dawn Prince is here for routine follow up.  Dawn Prince continues to have sciatic pain which Dawn Prince rates as a 3/10 today.  Dawn Prince states that the gabapentin 800 mg TID has not helped.  Dawn Prince has received 2 steroid injections of the spine with improvement and is scheduled for another in the near future.  Dawn Prince recently underwent x-rays of the spine [facility].  Dawn Prince reports an episode of nausea and explosive diarrhea, and has used Nexium with improvement.  White count is stable at 3.1 with an Eustace of 1830, improved, hemoglobin is normal at 12.0, and platelet count is normal.  Chemistries are unremarkable except  for a BUN of 27, and mildly elevated liver transaminases.  Her  appetite is good, and Dawn Prince has gained 4 pounds since her last visit.  Dawn Prince denies fever, chills or other signs of infection.  Dawn Prince denies nausea, vomiting, bowel issues, or abdominal pain.  Dawn Prince denies sore throat,  cough, dyspnea, or chest pain.  REVIEW OF SYSTEMS:  Review of Systems  Constitutional: Negative.  Negative for appetite change, chills, fatigue, fever and unexpected weight change.  HENT:  Negative.    Eyes: Negative.   Respiratory: Negative.  Negative for chest tightness, cough, hemoptysis, shortness of breath and wheezing.   Cardiovascular: Negative.  Negative for chest pain, leg swelling and palpitations.  Gastrointestinal:  Positive for diarrhea (1 episode, resolved) and nausea (1 episode, resolved). Negative for abdominal distention, abdominal pain, blood in stool, constipation and vomiting.  Endocrine: Negative.   Genitourinary: Negative.  Negative for difficulty urinating, dysuria, frequency and hematuria.   Musculoskeletal:  Positive for back pain. Negative for arthralgias, flank pain, gait problem and myalgias.  Skin: Negative.   Neurological: Negative.  Negative for dizziness, extremity weakness, gait problem, headaches, light-headedness, numbness, seizures and speech difficulty.  Hematological: Negative.   Psychiatric/Behavioral: Negative.  Negative for depression and sleep disturbance. The patient is not nervous/anxious.     VITALS:  Blood pressure 137/78, temperature 97.9 F (36.6 C), temperature source Oral, weight 133 lb 3.2 oz (60.4 kg), SpO2 98 %.  Wt Readings from Last 3 Encounters:  01/25/21 133 lb 3.2 oz (60.4 kg)  10/13/20 129 lb 6.4 oz (58.7 kg)  10/03/20 135 lb (61.2 kg)    Body mass index is 25.17 kg/m.  Performance status (ECOG): 1 - Symptomatic but completely ambulatory  PHYSICAL EXAM:  Physical Exam Constitutional:      General: Dawn Prince is not in acute distress.    Appearance: Normal appearance. Dawn Prince is normal weight.  HENT:     Head: Normocephalic and atraumatic.  Eyes:     General: No scleral icterus.    Extraocular Movements: Extraocular movements intact.     Conjunctiva/sclera: Conjunctivae normal.     Pupils: Pupils are equal, round, and reactive  to light.  Cardiovascular:     Rate and Rhythm: Normal rate and regular rhythm.     Pulses: Normal pulses.     Heart sounds: Normal heart sounds. No murmur heard.   No friction rub. No gallop.  Pulmonary:     Effort: Pulmonary effort is normal. No respiratory distress.     Breath sounds: Normal breath sounds.  Abdominal:     General: Bowel sounds are normal. There is no distension.     Palpations: Abdomen is soft. There is no hepatomegaly, splenomegaly or mass.     Tenderness: There is no abdominal tenderness.  Musculoskeletal:        General: Normal range of motion.     Cervical back: Normal range of motion and neck supple.     Right lower leg: No edema.     Left lower leg: No edema.  Lymphadenopathy:     Cervical: Cervical adenopathy (bilateral rint cervical nodes that are less than 1 cm) present.     Left cervical: Posterior cervical adenopathy (about 2 cm in length, firm and rubbery) present.     Upper Body:     Right upper body: Supraclavicular adenopathy and axillary adenopathy (right anterior, 2 cm) present.     Left upper body: Supraclavicular adenopathy (left greater than right with the largest node measuring 2 cm  across, but most others are less than 1 cm) present.     Lower Body: Right inguinal adenopathy (Firmness of the inguinal area with adenopathy superior to that bilaterally) present. Left inguinal adenopathy (Firmness of the inguinal area with adenopathy superior to that bilaterally.) present.  Skin:    General: Skin is warm and dry.  Neurological:     General: No focal deficit present.     Mental Status: Dawn Prince is alert and oriented to person, place, and time. Mental status is at baseline.  Psychiatric:        Mood and Affect: Mood normal.        Behavior: Behavior normal.        Thought Content: Thought content normal.        Judgment: Judgment normal.    LABS:   CBC Latest Ref Rng & Units 01/25/2021 10/11/2020 06/20/2020  WBC - 3.1 3.1 3.6  Hemoglobin 12.0 - 16.0  12.0 12.1 12.4  Hematocrit 36 - 46 35(A) 36 37  Platelets 150 - 399 193 199 218   CMP Latest Ref Rng & Units 01/25/2021 10/11/2020 06/20/2020  BUN 4 - 21 27(A) 12 17  Creatinine 0.5 - 1.1 0.9 0.7 0.9  Sodium 137 - 147 138 139 140  Potassium 3.4 - 5.3 4.8 5.1 5.3  Chloride 99 - 108 106 104 106  CO2 13 - 22 27(A) 30(A) 29(A)  Calcium 8.7 - 10.7 8.9 9.3 9.2  Alkaline Phos 25 - 125 56 69 61  AST 13 - 35 46(A) 32 32  ALT 7 - 35 46(A) 31 31     Lab Results  Component Value Date   LDH 458 10/11/2020     STUDIES:   Allergies: No Known Allergies  Current Medications: Current Outpatient Medications  Medication Sig Dispense Refill   ascorbic acid (VITAMIN C) 500 MG tablet Take 1 tablet by mouth daily.     atorvastatin (LIPITOR) 20 MG tablet Take 20 mg by mouth daily.     buPROPion (WELLBUTRIN) 75 MG tablet bupropion HCl 75 mg tablet   1 tablet every day by oral route.     Calcium Carb-Cholecalciferol (CALCIUM CARBONATE-VITAMIN D3 PO) Take by mouth.     chlorpheniramine (CHLOR-TRIMETON) 4 MG tablet ChlorTabs 4 mg tablet   1 tablet every day by oral route.     diclofenac (VOLTAREN) 75 MG EC tablet Take 75 mg by mouth 2 (two) times daily.     ELDERBERRY PO Elderberry     FLUoxetine (PROZAC) 20 MG capsule Take 60 mg by mouth daily.     gabapentin (NEURONTIN) 100 MG capsule Take 100 mg by mouth 3 (three) times daily as needed.     gabapentin (NEURONTIN) 300 MG capsule Take 800 mg by mouth 3 (three) times daily.     INTRAROSA 6.5 MG INST insert 1 VAGINALLY EVERY DAY AT BEDTIME Vaginal] (Patient not taking: No sig reported)     levothyroxine (SYNTHROID) 25 MCG tablet Take 25 mcg by mouth every morning.     Magnesium 100 MG CAPS Take 1 capsule by mouth 2 (two) times daily. (Patient not taking: No sig reported)     Misc Natural Products (METABO-STYLE PO) Take 1 tablet by mouth 2 (two) times daily.     Misc Natural Products (SLENDER HERB DIET FORMULA PO) Take 1 tablet by mouth daily.      Multiple Vitamin (MULTIVITAMIN ADULT PO) Take 1 capsule by mouth daily.     Multiple Vitamins-Minerals (ZINC PO) zinc  Omega-3 Fat Ac-Cholecalciferol (OMEGA ESSENTIALS/VIT D3) [id number] MG-UNIT CAPS Take 1 capsule by mouth daily.     ondansetron (ZOFRAN-ODT) 4 MG disintegrating tablet ondansetron 4 mg disintegrating tablet  DISSOLVE 1 TABLET BY MOUTH EVERY 6 HOURS AS NEEDED FOR VOMITING     Probiotic Product (PROBIOTIC BLEND PO) Probiotic     QUEtiapine (SEROQUEL) 100 MG tablet Take 100 mg by mouth daily as needed.     QUEtiapine (SEROQUEL) 300 MG tablet Take 300 mg by mouth at bedtime.     ramipril (ALTACE) 1.25 MG capsule Take 1.25 mg by mouth daily.     TURMERIC PO turmeric     No current facility-administered medications for this visit.     ASSESSMENT & PLAN:   Assessment:   1. Small lymphocytic low-grade lymphoma, diagnosed in June 2017.  Dawn Prince remains fairly stable on observation only.  We do not recommend treatment at this time as Dawn Prince remains largely asymptomatic.  2. Borderline leukopenia, which fluctuates up and down. B12 and folate levels were both normal.  3. Elevated risk of breast cancer over 29% due to personal and family history.  We have recommended annual MRI breasts in addition to annual mammography, but we will hold off for now in view of her other circumstances.  Plan: Her lymphadenopathy is increased of the left neck yet Dawn Prince remains mostly asymptomatic. Dawn Prince will take her gabapentin three times daily as prescribed and continues to follow with the spine specialists.  We will see her back in clinic in 3 months with CBC and CMP for repeat evaluation.  Both her and her husband verbalize understanding of and agreement to the plans discussed today. They know to call the office should any new questions or concerns arise.    Derwood Kaplan, MD [facility] AT [facility] [address] [place] [address] Dept:  [phone] Dept Fax: [phone]   I, Rita Ohara, am acting as scribe for Derwood Kaplan, MD  I have reviewed this report as typed by the medical scribe, and it is complete and accurate.

## 2021-01-24 ENCOUNTER — Other Ambulatory Visit: Payer: Self-pay | Admitting: Oncology

## 2021-01-24 DIAGNOSIS — C8302 Small cell B-cell lymphoma, intrathoracic lymph nodes: Secondary | ICD-10-CM

## 2021-01-25 ENCOUNTER — Inpatient Hospital Stay (INDEPENDENT_AMBULATORY_CARE_PROVIDER_SITE_OTHER): Payer: Managed Care, Other (non HMO) | Admitting: Oncology

## 2021-01-25 ENCOUNTER — Other Ambulatory Visit: Payer: Self-pay

## 2021-01-25 ENCOUNTER — Telehealth: Payer: Self-pay | Admitting: Oncology

## 2021-01-25 ENCOUNTER — Other Ambulatory Visit: Payer: Self-pay | Admitting: Oncology

## 2021-01-25 ENCOUNTER — Inpatient Hospital Stay: Payer: Managed Care, Other (non HMO) | Attending: Oncology

## 2021-01-25 ENCOUNTER — Other Ambulatory Visit: Payer: Self-pay | Admitting: Hematology and Oncology

## 2021-01-25 VITALS — BP 137/78 | Temp 97.9°F | Wt 133.2 lb

## 2021-01-25 DIAGNOSIS — C8300 Small cell B-cell lymphoma, unspecified site: Secondary | ICD-10-CM | POA: Insufficient documentation

## 2021-01-25 DIAGNOSIS — C8302 Small cell B-cell lymphoma, intrathoracic lymph nodes: Secondary | ICD-10-CM

## 2021-01-25 DIAGNOSIS — Z79899 Other long term (current) drug therapy: Secondary | ICD-10-CM | POA: Insufficient documentation

## 2021-01-25 DIAGNOSIS — Z803 Family history of malignant neoplasm of breast: Secondary | ICD-10-CM | POA: Diagnosis not present

## 2021-01-25 DIAGNOSIS — C9110 Chronic lymphocytic leukemia of B-cell type not having achieved remission: Secondary | ICD-10-CM

## 2021-01-25 LAB — BASIC METABOLIC PANEL
BUN: 27 — AB (ref 4–21)
CO2: 27 — AB (ref 13–22)
Chloride: 106 (ref 99–108)
Creatinine: 0.9 (ref 0.5–1.1)
Glucose: 113
Potassium: 4.8 (ref 3.4–5.3)
Sodium: 138 (ref 137–147)

## 2021-01-25 LAB — CBC AND DIFFERENTIAL
HCT: 35 — AB (ref 36–46)
Hemoglobin: 12 (ref 12.0–16.0)
Neutrophils Absolute: 1.83
Platelets: 193 (ref 150–399)
WBC: 3.1

## 2021-01-25 LAB — COMPREHENSIVE METABOLIC PANEL
Albumin: 4.1 (ref 3.5–5.0)
Calcium: 8.9 (ref 8.7–10.7)

## 2021-01-25 LAB — HEPATIC FUNCTION PANEL
ALT: 46 — AB (ref 7–35)
AST: 46 — AB (ref 13–35)
Alkaline Phosphatase: 56 (ref 25–125)
Bilirubin, Total: 0.3

## 2021-01-25 LAB — LACTATE DEHYDROGENASE: LDH: 166 U/L (ref 98–192)

## 2021-01-25 LAB — CBC
MCV: 99 (ref 81–99)
RBC: 3.56 — AB (ref 3.87–5.11)

## 2021-01-25 NOTE — Telephone Encounter (Signed)
Per 9/1 LOS, patient scheduled for Dec Appt's.  Patient entered Appt's in her Calendar

## 2021-01-28 ENCOUNTER — Encounter: Payer: Self-pay | Admitting: Oncology

## 2021-02-08 ENCOUNTER — Encounter (HOSPITAL_COMMUNITY): Payer: Self-pay | Admitting: Emergency Medicine

## 2021-02-08 ENCOUNTER — Other Ambulatory Visit: Payer: Self-pay

## 2021-02-08 ENCOUNTER — Emergency Department (HOSPITAL_COMMUNITY): Payer: Managed Care, Other (non HMO)

## 2021-02-08 ENCOUNTER — Inpatient Hospital Stay (HOSPITAL_COMMUNITY)
Admission: EM | Admit: 2021-02-08 | Discharge: 2021-02-13 | DRG: 065 | Disposition: A | Discharge status: Home / Self Care | Payer: Managed Care, Other (non HMO) | Attending: Internal Medicine | Admitting: Internal Medicine

## 2021-02-08 DIAGNOSIS — I639 Cerebral infarction, unspecified: Secondary | ICD-10-CM | POA: Diagnosis present

## 2021-02-08 DIAGNOSIS — K219 Gastro-esophageal reflux disease without esophagitis: Secondary | ICD-10-CM | POA: Diagnosis present

## 2021-02-08 DIAGNOSIS — E785 Hyperlipidemia, unspecified: Secondary | ICD-10-CM | POA: Diagnosis present

## 2021-02-08 DIAGNOSIS — C8302 Small cell B-cell lymphoma, intrathoracic lymph nodes: Secondary | ICD-10-CM | POA: Diagnosis present

## 2021-02-08 DIAGNOSIS — I6381 Other cerebral infarction due to occlusion or stenosis of small artery: Secondary | ICD-10-CM | POA: Diagnosis not present

## 2021-02-08 DIAGNOSIS — G4733 Obstructive sleep apnea (adult) (pediatric): Secondary | ICD-10-CM | POA: Diagnosis present

## 2021-02-08 DIAGNOSIS — Z808 Family history of malignant neoplasm of other organs or systems: Secondary | ICD-10-CM

## 2021-02-08 DIAGNOSIS — Z807 Family history of other malignant neoplasms of lymphoid, hematopoietic and related tissues: Secondary | ICD-10-CM

## 2021-02-08 DIAGNOSIS — Z20822 Contact with and (suspected) exposure to covid-19: Secondary | ICD-10-CM | POA: Diagnosis present

## 2021-02-08 DIAGNOSIS — D72819 Decreased white blood cell count, unspecified: Secondary | ICD-10-CM | POA: Diagnosis present

## 2021-02-08 DIAGNOSIS — G8929 Other chronic pain: Secondary | ICD-10-CM | POA: Diagnosis present

## 2021-02-08 DIAGNOSIS — I159 Secondary hypertension, unspecified: Secondary | ICD-10-CM | POA: Diagnosis present

## 2021-02-08 DIAGNOSIS — E559 Vitamin D deficiency, unspecified: Secondary | ICD-10-CM | POA: Diagnosis present

## 2021-02-08 DIAGNOSIS — E039 Hypothyroidism, unspecified: Secondary | ICD-10-CM | POA: Diagnosis present

## 2021-02-08 DIAGNOSIS — Z79890 Hormone replacement therapy (postmenopausal): Secondary | ICD-10-CM

## 2021-02-08 DIAGNOSIS — Z79899 Other long term (current) drug therapy: Secondary | ICD-10-CM

## 2021-02-08 DIAGNOSIS — Z803 Family history of malignant neoplasm of breast: Secondary | ICD-10-CM

## 2021-02-08 DIAGNOSIS — F319 Bipolar disorder, unspecified: Secondary | ICD-10-CM | POA: Diagnosis present

## 2021-02-08 LAB — COMPREHENSIVE METABOLIC PANEL
ALT: 60 U/L — ABNORMAL HIGH (ref 0–44)
AST: 43 U/L — ABNORMAL HIGH (ref 15–41)
Albumin: 3.4 g/dL — ABNORMAL LOW (ref 3.5–5.0)
Alkaline Phosphatase: 62 U/L (ref 38–126)
Anion gap: 5 (ref 5–15)
BUN: 22 mg/dL (ref 8–23)
CO2: 26 mmol/L (ref 22–32)
Calcium: 8.9 mg/dL (ref 8.9–10.3)
Chloride: 108 mmol/L (ref 98–111)
Creatinine, Ser: 1.01 mg/dL — ABNORMAL HIGH (ref 0.44–1.00)
GFR, Estimated: >60 mL/min (ref >60)
Glucose, Bld: 102 mg/dL — ABNORMAL HIGH (ref 70–99)
Potassium: 4.2 mmol/L (ref 3.5–5.1)
Sodium: 139 mmol/L (ref 135–145)
Total Bilirubin: 0.4 mg/dL (ref 0.3–1.2)
Total Protein: 6.4 g/dL — ABNORMAL LOW (ref 6.5–8.1)

## 2021-02-08 LAB — CBC WITH DIFFERENTIAL/PLATELET
Abs Immature Granulocytes: 0.01 10*3/uL (ref 0.00–0.07)
Basophils Absolute: 0 10*3/uL (ref 0.0–0.1)
Basophils Relative: 1 %
Eosinophils Absolute: 0.1 10*3/uL (ref 0.0–0.5)
Eosinophils Relative: 3 %
HCT: 36.7 % (ref 36.0–46.0)
Hemoglobin: 11.7 g/dL — ABNORMAL LOW (ref 12.0–15.0)
Immature Granulocytes: 0 %
Lymphocytes Relative: 35 %
Lymphs Abs: 1.3 10*3/uL (ref 0.7–4.0)
MCH: 33.1 pg (ref 26.0–34.0)
MCHC: 31.9 g/dL (ref 30.0–36.0)
MCV: 104 fL — ABNORMAL HIGH (ref 80.0–100.0)
Monocytes Absolute: 0.2 10*3/uL (ref 0.1–1.0)
Monocytes Relative: 4 %
Neutro Abs: 2.2 10*3/uL (ref 1.7–7.7)
Neutrophils Relative %: 57 %
Platelets: 222 10*3/uL (ref 150–400)
RBC: 3.53 MIL/uL — ABNORMAL LOW (ref 3.87–5.11)
RDW: 14 % (ref 11.5–15.5)
WBC: 3.8 10*3/uL — ABNORMAL LOW (ref 4.0–10.5)
nRBC: 0 % (ref 0.0–0.2)

## 2021-02-08 LAB — RESP PANEL BY RT-PCR (FLU A&B, COVID) ARPGX2
Influenza A by PCR: NEGATIVE
Influenza B by PCR: NEGATIVE
SARS Coronavirus 2 by RT PCR: NEGATIVE

## 2021-02-08 MED ORDER — CLOPIDOGREL BISULFATE 75 MG PO TABS
75.0000 mg | ORAL_TABLET | Freq: Every day | ORAL | Status: DC
  Administered 2021-02-09 – 2021-02-12 (×4): 75 mg via ORAL
  Filled 2021-02-08 (×4): qty 1

## 2021-02-08 MED ORDER — ACETAMINOPHEN 160 MG/5ML PO SOLN: 650.0000 mg | ORAL | Status: DC | PRN

## 2021-02-08 MED ORDER — FLUOXETINE HCL 20 MG PO CAPS
60.0000 mg | ORAL_CAPSULE | Freq: Every day | ORAL | Status: DC
  Administered 2021-02-09 – 2021-02-12 (×4): 60 mg via ORAL
  Filled 2021-02-08 (×4): qty 3

## 2021-02-08 MED ORDER — ACETAMINOPHEN 650 MG RE SUPP: 650.0000 mg | RECTAL | Status: DC | PRN

## 2021-02-08 MED ORDER — QUETIAPINE FUMARATE 200 MG PO TABS
300.0000 mg | ORAL_TABLET | Freq: Every day | ORAL | Status: DC
  Administered 2021-02-08 – 2021-02-12 (×5): 300 mg via ORAL
  Filled 2021-02-08 (×6): qty 1

## 2021-02-08 MED ORDER — PANTOPRAZOLE SODIUM 40 MG PO TBEC
40.0000 mg | DELAYED_RELEASE_TABLET | Freq: Every day | ORAL | Status: DC
  Administered 2021-02-08 – 2021-02-12 (×5): 40 mg via ORAL
  Filled 2021-02-08 (×5): qty 1

## 2021-02-08 MED ORDER — CLOPIDOGREL BISULFATE 300 MG PO TABS
300.0000 mg | ORAL_TABLET | Freq: Once | ORAL | Status: AC
  Administered 2021-02-08: 300 mg via ORAL
  Filled 2021-02-08: qty 1

## 2021-02-08 MED ORDER — GABAPENTIN 400 MG PO CAPS
800.0000 mg | ORAL_CAPSULE | Freq: Three times a day (TID) | ORAL | Status: DC
  Administered 2021-02-08 – 2021-02-12 (×12): 800 mg via ORAL
  Filled 2021-02-08 (×12): qty 2

## 2021-02-08 MED ORDER — BUPROPION HCL 75 MG PO TABS
75.0000 mg | ORAL_TABLET | Freq: Two times a day (BID) | ORAL | Status: DC
  Administered 2021-02-08 – 2021-02-12 (×8): 75 mg via ORAL
  Filled 2021-02-08 (×12): qty 1

## 2021-02-08 MED ORDER — ASPIRIN 325 MG PO TABS
325.0000 mg | ORAL_TABLET | Freq: Once | ORAL | Status: AC
  Administered 2021-02-08: 325 mg via ORAL
  Filled 2021-02-08: qty 1

## 2021-02-08 MED ORDER — SENNOSIDES-DOCUSATE SODIUM 8.6-50 MG PO TABS
1.0000 | ORAL_TABLET | Freq: Every evening | ORAL | Status: DC | PRN
  Administered 2021-02-10 – 2021-02-11 (×2): 1 via ORAL
  Filled 2021-02-08 (×2): qty 1

## 2021-02-08 MED ORDER — LEVOTHYROXINE SODIUM 25 MCG PO TABS
25.0000 ug | ORAL_TABLET | Freq: Every day | ORAL | Status: DC
  Administered 2021-02-09 – 2021-02-13 (×5): 25 ug via ORAL
  Filled 2021-02-08 (×5): qty 1

## 2021-02-08 MED ORDER — ATORVASTATIN CALCIUM 10 MG PO TABS
20.0000 mg | ORAL_TABLET | Freq: Every day | ORAL | Status: DC
  Administered 2021-02-08 – 2021-02-09 (×2): 20 mg via ORAL
  Filled 2021-02-08: qty 2

## 2021-02-08 MED ORDER — STROKE: EARLY STAGES OF RECOVERY BOOK
Freq: Once | Status: DC
  Filled 2021-02-08 (×2): qty 1

## 2021-02-08 MED ORDER — ENOXAPARIN SODIUM 40 MG/0.4ML IJ SOSY
40.0000 mg | PREFILLED_SYRINGE | INTRAMUSCULAR | Status: DC
  Administered 2021-02-08 – 2021-02-12 (×5): 40 mg via SUBCUTANEOUS
  Filled 2021-02-08 (×5): qty 0.4

## 2021-02-08 MED ORDER — RAMIPRIL 1.25 MG PO CAPS
1.2500 mg | ORAL_CAPSULE | Freq: Every day | ORAL | Status: DC
  Filled 2021-02-08: qty 1

## 2021-02-08 MED ORDER — ACETAMINOPHEN 325 MG PO TABS: 650.0000 mg | ORAL_TABLET | ORAL | Status: DC | PRN

## 2021-02-08 MED ORDER — ASPIRIN EC 81 MG PO TBEC
81.0000 mg | DELAYED_RELEASE_TABLET | Freq: Every day | ORAL | Status: DC
  Administered 2021-02-09 – 2021-02-12 (×4): 81 mg via ORAL
  Filled 2021-02-08 (×4): qty 1

## 2021-02-08 MED ORDER — ESOMEPRAZOLE MAGNESIUM 20 MG PO CPDR: 20.0000 mg | DELAYED_RELEASE_CAPSULE | Freq: Every day | ORAL | Status: DC

## 2021-02-08 NOTE — ED Provider Notes (Signed)
[facility] EMERGENCY DEPARTMENT Provider Note   CSN: [id number] Arrival date & time: 02/08/21  1301     History Chief Complaint  Patient presents with   Numbness    Dawn Prince is a 60 y.o. female.  HPI She presents for evaluation of numbness of the left face, left arm and left leg, which started 2 days ago.  She denies headache, weakness, dizziness, nausea, vomiting, fever or chills.  No prior similar problems.    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 Small internal hemorrhoids.    ENDOSCOPIC PLANTAR FASCIOTOMY     ESOPHAGOGASTRODUODENOSCOPY  03/17/2013   Mild gastritis. Status post esophageal dilatation.   LYMPH NODE BIOPSY     WISDOM TOOTH EXTRACTION       OB History   No obstetric history on file.     Family History  Problem Relation Age of Onset   Prostate cancer Father 67   Melanoma Father 74   Breast cancer  Maternal Aunt    Multiple myeloma Maternal Aunt    Breast cancer Maternal Aunt    Colon cancer Neg Hx    Colon polyps Neg Hx    Esophageal cancer Neg Hx    Rectal cancer Neg Hx    Stomach cancer Neg Hx     Social History   Tobacco Use   Smoking status: Never   Smokeless tobacco: Never  Vaping Use   Vaping Use: Never used  Substance Use Topics   Alcohol use: Not Currently   Drug use: Never    Home Medications Prior to Admission medications   Medication Sig Start Date End Date Taking? Authorizing Provider  ascorbic acid (VITAMIN C) 500 MG tablet Take 1 tablet by mouth daily. 06/20/20  Yes [provider]  atorvastatin (LIPITOR) 20 MG tablet Take 20 mg by mouth daily. 04/17/20  Yes [provider]  buPROPion (WELLBUTRIN) 75 MG tablet Take 75 mg by mouth 2 (two) times daily. 10/08/16  Yes [provider]  Calcium Carb-Cholecalciferol (CALCIUM CARBONATE-VITAMIN D3 PO) Take 1 tablet by mouth daily.   Yes [provider]  chlorpheniramine (CHLOR-TRIMETON) 4 MG tablet Take 4 mg by mouth daily as needed for allergies. 07/11/20  Yes [provider]  diclofenac (VOLTAREN) 75 MG EC tablet Take 75 mg by mouth 2 (  two) times daily. 01/16/21  Yes [provider]  ELDERBERRY PO Take 1 tablet by mouth daily.   Yes [provider]  Esomeprazole Magnesium (NEXIUM 24HR PO) Take 1 capsule by mouth at bedtime.   Yes [provider]  FLUoxetine (PROZAC) 20 MG capsule Take 60 mg by mouth daily. 06/13/20  Yes [provider]  gabapentin (NEURONTIN) 100 MG capsule Take 200 mg by mouth 3 (three) times daily. Takes along with 600 mg capsule to total 800 mg 04/17/20  Yes [provider]  gabapentin (NEURONTIN) 300 MG capsule Take 600 mg by mouth 3 (three) times daily. Takes along with 200 mg capsule to total 800 mg 01/08/21  Yes [provider]  levothyroxine (SYNTHROID) 25 MCG tablet Take 25 mcg by mouth every  morning. 06/13/20  Yes [provider]  Multiple Vitamin (MULTIVITAMIN ADULT PO) Take 1 capsule by mouth daily. 06/20/20  Yes [provider]  Omega 3 1000 MG CAPS Take 1,000 mg by mouth daily.   Yes [provider]  Probiotic Product (PROBIOTIC BLEND PO) Take 1 capsule by mouth daily.   Yes [provider]  QUEtiapine (SEROQUEL) 100 MG tablet Take 100 mg by mouth daily as needed (depression). 03/08/20  Yes [provider]  QUEtiapine (SEROQUEL) 300 MG tablet Take 300 mg by mouth at bedtime. 11/29/20  Yes [provider]  ramipril (ALTACE) 1.25 MG capsule Take 1.25 mg by mouth daily. 06/01/20  Yes [provider]  zinc gluconate 50 MG tablet Take 50 mg by mouth daily.   Yes [provider]    Allergies    Patient has no known allergies.  Review of Systems   Review of Systems  All other systems reviewed and are negative.  Physical Exam Updated Vital Signs BP (!) 150/84   Pulse 90   Temp 98.6 F (37 C) (Oral)   Resp 18   SpO2 98%   Physical Exam Vitals and nursing note reviewed.  Constitutional:      General: She is not in acute distress.    Appearance: She is well-developed. She is not ill-appearing, toxic-appearing or diaphoretic.  HENT:     Head: Normocephalic and atraumatic.     Right Ear: External ear normal.     Left Ear: External ear normal.  Eyes:     Conjunctiva/sclera: Conjunctivae normal.     Pupils: Pupils are equal, round, and reactive to light.  Neck:     Trachea: Phonation normal.  Cardiovascular:     Rate and Rhythm: Normal rate and regular rhythm.     Heart sounds: Normal heart sounds.  Pulmonary:     Effort: Pulmonary effort is normal.     Breath sounds: Normal breath sounds.  Abdominal:     General: There is no distension.     Palpations: Abdomen is soft.     Tenderness: There is no abdominal tenderness.  Musculoskeletal:        General: Normal range of motion.     Cervical back:  Normal range of motion and neck supple.  Skin:    General: Skin is warm and dry.  Neurological:     Mental Status: She is alert and oriented to person, place, and time.     Cranial Nerves: No cranial nerve deficit.     Sensory: Sensory deficit (Decreased light touch sensation, left face, left arm and hand, left leg and foot.) present.     Motor: No abnormal muscle tone.  Coordination: Coordination normal.     Comments: No dysarthria, aphasia, pronator drift or ataxia.  Psychiatric:        Mood and Affect: Mood normal.        Behavior: Behavior normal.        Thought Content: Thought content normal.        Judgment: Judgment normal.    ED Results / Procedures / Treatments   Labs (all labs ordered are listed, but only abnormal results are displayed) Labs Reviewed  COMPREHENSIVE METABOLIC PANEL - Abnormal; Notable for the following components:      Result Value   Glucose, Bld 102 (*)    Creatinine, Ser 1.01 (*)    Total Protein 6.4 (*)    Albumin 3.4 (*)    AST 43 (*)    ALT 60 (*)    All other components within normal limits  CBC WITH DIFFERENTIAL/PLATELET - Abnormal; Notable for the following components:   WBC 3.8 (*)    RBC 3.53 (*)    Hemoglobin 11.7 (*)    MCV 104.0 (*)    All other components within normal limits  RESP PANEL BY RT-PCR (FLU A&B, COVID) [id number]    EKG EKG Interpretation  Date/Time:  Thursday February 08 2021 13:13:49 EDT Ventricular Rate:  102 PR Interval:  152 QRS Duration: 74 QT Interval:  356 QTC Calculation: 463 R Axis:   87 Text Interpretation: Sinus tachycardia Otherwise normal ECG No old tracing to compare Confirmed by Daleen Bo [phone]) on 02/08/2021 7:21:41 PM  Radiology CT HEAD WO CONTRAST (5MM)  Result Date: 02/08/2021 CLINICAL DATA:  Numbness and tingling, paresthesias, altered mental status EXAM: CT HEAD WITHOUT CONTRAST CT CERVICAL SPINE WITHOUT CONTRAST TECHNIQUE: Multidetector CT imaging of the head and cervical spine was  performed following the standard protocol without intravenous contrast. Multiplanar CT image reconstructions of the cervical spine were also generated. COMPARISON:  10/11/2020 FINDINGS: CT HEAD FINDINGS Brain: No acute infarct or hemorrhage. Lateral ventricles and midline structures are unremarkable. No acute extra-axial fluid collections. No mass effect. Vascular: No hyperdense vessel or unexpected calcification. Skull: Normal. Negative for fracture or focal lesion. Sinuses/Orbits: No acute finding. Other: None. CT CERVICAL SPINE FINDINGS Alignment: Mild anterolisthesis of C4 on C5 likely due to facet hypertrophy. Otherwise alignment is anatomic. Skull base and vertebrae: No acute fracture. No primary bone lesion or focal pathologic process. Soft tissues and spinal canal: Numerous borderline enlarged lymph nodes are seen throughout the neck compatible with patient's known history of B-cell lymphoma. Index lymph nodes are as follows: Right level 2, image 43/6, 8 mm.  Stable. Right level 2, image 43/6, 6 mm, previously 7 mm. Left level 2, image 44/6, 8 mm.  Stable. Left supraclavicular mass, image 84/6, 36 x 17 mm.  Stable. Prevertebral and retropharyngeal soft tissues are unremarkable. Disc levels: There is prominent spondylosis at C5-6 and C6-7, with mild symmetrical neural foraminal encroachment most pronounced at C5-6. Mild diffuse facet hypertrophy greatest at C3-4 and C4-5. Upper chest: Airway is patent. Limited imaging through the lung apices are clear. Other: Reconstructed images demonstrate no additional findings. IMPRESSION: 1. No acute cervical spine fracture. 2. Mild anterolisthesis of C4 on C5, likely due to facet hypertrophy. 3. Prominent spondylosis at C5-6 and C6-7 with minimal neural foraminal encroachment. 4. Diffuse lymphadenopathy throughout the neck, consistent with B-cell lymphoma. Grossly stable since prior CT 10/11/2020. Electronically Signed   By: Randa Ngo M.D.   On: 02/08/2021 15:35    CT Cervical Spine Wo  Contrast  Result Date: 02/08/2021 CLINICAL DATA:  Numbness and tingling, paresthesias, altered mental status EXAM: CT HEAD WITHOUT CONTRAST CT CERVICAL SPINE WITHOUT CONTRAST TECHNIQUE: Multidetector CT imaging of the head and cervical spine was performed following the standard protocol without intravenous contrast. Multiplanar CT image reconstructions of the cervical spine were also generated. COMPARISON:  10/11/2020 FINDINGS: CT HEAD FINDINGS Brain: No acute infarct or hemorrhage. Lateral ventricles and midline structures are unremarkable. No acute extra-axial fluid collections. No mass effect. Vascular: No hyperdense vessel or unexpected calcification. Skull: Normal. Negative for fracture or focal lesion. Sinuses/Orbits: No acute finding. Other: None. CT CERVICAL SPINE FINDINGS Alignment: Mild anterolisthesis of C4 on C5 likely due to facet hypertrophy. Otherwise alignment is anatomic. Skull base and vertebrae: No acute fracture. No primary bone lesion or focal pathologic process. Soft tissues and spinal canal: Numerous borderline enlarged lymph nodes are seen throughout the neck compatible with patient's known history of B-cell lymphoma. Index lymph nodes are as follows: Right level 2, image 43/6, 8 mm.  Stable. Right level 2, image 43/6, 6 mm, previously 7 mm. Left level 2, image 44/6, 8 mm.  Stable. Left supraclavicular mass, image 84/6, 36 x 17 mm.  Stable. Prevertebral and retropharyngeal soft tissues are unremarkable. Disc levels: There is prominent spondylosis at C5-6 and C6-7, with mild symmetrical neural foraminal encroachment most pronounced at C5-6. Mild diffuse facet hypertrophy greatest at C3-4 and C4-5. Upper chest: Airway is patent. Limited imaging through the lung apices are clear. Other: Reconstructed images demonstrate no additional findings. IMPRESSION: 1. No acute cervical spine fracture. 2. Mild anterolisthesis of C4 on C5, likely due to facet hypertrophy. 3.  Prominent spondylosis at C5-6 and C6-7 with minimal neural foraminal encroachment. 4. Diffuse lymphadenopathy throughout the neck, consistent with B-cell lymphoma. Grossly stable since prior CT 10/11/2020. Electronically Signed   By: Randa Ngo M.D.   On: 02/08/2021 15:35   MR BRAIN WO CONTRAST  Result Date: 02/08/2021 CLINICAL DATA:  Neurological deficit, acute, stroke suspected. Left-sided numbness. EXAM: MRI HEAD WITHOUT CONTRAST TECHNIQUE: Multiplanar, multiecho pulse sequences of the brain and surrounding structures were obtained without intravenous contrast. COMPARISON:  Head CT same day. FINDINGS: Brain: Diffusion imaging shows an acute small vessel infarction in the right lateral thalamus posteriorly. No other acute infarction. Elsewhere, there are a few old small vessel infarctions of the deep white matter. No cortical or large vessel territory infarction. No mass lesion, hemorrhage, hydrocephalus or extra-axial collection. Vascular: Major vessels at the base of the brain show flow. Skull and upper cervical spine: Negative Sinuses/Orbits: Clear/normal Other: None IMPRESSION: Acute small vessel infarction of the posterolateral right thalamus. Mild chronic small-vessel ischemic changes elsewhere affecting the cerebral hemispheric white matter. Electronically Signed   By: Nelson Chimes M.D.   On: 02/08/2021 18:13    Procedures .Critical Care Performed by: Daleen Bo, MD Authorized by: Daleen Bo, MD   Critical care provider statement:    Critical care time (minutes):  35   Critical care start time:  02/08/2021 4:20 PM   Critical care end time:  02/08/2021 7:19 PM   Critical care time was exclusive of:  Separately billable procedures and treating other patients   Critical care was necessary to treat or prevent imminent or life-threatening deterioration of the following conditions:  CNS failure or compromise   Critical care was time spent personally by me on the following activities:   Blood draw for specimens, development of treatment plan with patient or surrogate, discussions with consultants, evaluation  of patient's response to treatment, examination of patient, obtaining history from patient or surrogate, ordering and performing treatments and interventions, ordering and review of laboratory studies, pulse oximetry, re-evaluation of patient's condition, review of old charts and ordering and review of radiographic studies   Medications Ordered in ED Medications - No data to display  ED Course  I have reviewed the triage vital signs and the nursing notes.  Pertinent labs & imaging results that were available during my care of the patient were reviewed by me and considered in my medical decision making (see chart for details).  Clinical Course as of 02/10/21 1248  Thu Feb 08, 2021  1853 Case discussed with stroke neurologist.  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  Anticipate usual treatment, okay to control blood pressure. [EW]    Clinical Course User Index [EW] Daleen Bo, MD   MDM Rules/Calculators/A&P                            Patient Vitals for the past 24 hrs:  BP Temp Temp src Pulse Resp SpO2  02/08/21 1630 (!) 150/84 -- -- 90 18 98 %  02/08/21 1545 (!) 149/86 -- -- 80 (!) 22 98 %  02/08/21 1500 (!) 148/98 98.6 F (37 C) Oral 84 18 99 %  02/08/21 1318 (!) 148/85 98.8 F (37.1 C) Oral 93 16 99 %    7:16 PM Reevaluation with update and discussion. After initial assessment and treatment, an updated evaluation reveals no change in clinical status, findings cussed with the patient and all questions were answered. Daleen Bo   Medical Decision Making:  This patient is presenting for evaluation of known this, which does require a range of treatment options, and is a complaint that involves a high risk of morbidity and mortality. The differential diagnoses include CVA, neuropathy. I decided to review old records, and in summary elderly female  presenting with numbness, subacute, no history of hypertension or CVA..  I do not require additional historical information from anyone.  Clinical Laboratory Tests Ordered, included  stroke bundle . Review indicates normal except hemoglobin low, MCV high, glucose high, creatinine high, total protein low, albumin low, AST high, ALT high. Radiologic Tests Ordered, included CT head, MRI brain.  I independently Visualized: Radiographic images, which show CT negative, MRI positive for CVA  Cardiac Monitor Tracing which shows normal sinus rhythm    Critical Interventions-clinical evaluation, laboratory testing, CT imaging, advanced radiography MRI, observation, discussion with neuro hospitalist to arrange consultation and discussion with hospitalist for admission  After These Interventions, the Patient was reevaluated and was found with acute CVA requiring intervention and evaluation in the ED the and hospital setting.  CRITICAL CARE-yes Performed by: Daleen Bo  Nursing Notes Reviewed/ Care Coordinated Applicable Imaging Reviewed Interpretation of Laboratory Data incorporated into ED treatment    7:30 PM-Case discussed with hospitalist who will admit the patient and manage blood pressure and begin stroke evaluation.   Final Clinical Impression(s) / ED Diagnoses Final diagnoses:  Cerebrovascular accident (CVA), unspecified mechanism ([place])  Secondary hypertension    Rx / DC Orders ED Discharge Orders     None        Daleen Bo, MD 02/10/21 1251

## 2021-02-08 NOTE — ED Provider Notes (Signed)
Emergency Medicine Provider Triage Evaluation Note  Dawn Prince , a 60 y.o. female  was evaluated in triage.  Pt complains of numbness to the left side of the body that started a few days ago. No loss of control of bowel or bladder function.  Review of Systems  Positive: Left sided numbness Negative: Loss of control of bowel or bladder function  Physical Exam  BP (!) 148/85 (BP Location: Left Arm)   Pulse 93   Temp 98.8 F (37.1 C) (Oral)   Resp 16   SpO2 99%  Gen:   Awake, no distress   Resp:  Normal effort  MSK:   Moves extremities without difficulty  Other:  Decreased sensation to the lue/lle  Medical Decision Making  Medically screening exam initiated at 1:30 PM.  Appropriate orders placed.  Dawn Prince was informed that the remainder of the evaluation will be completed by another provider, this initial triage assessment does not replace that evaluation, and the importance of remaining in the ED until their evaluation is complete.     Bishop Dublin 02/08/21 1333    Lajean Saver, MD 02/09/21 1504

## 2021-02-08 NOTE — ED Notes (Signed)
Patient transported to MRI 

## 2021-02-08 NOTE — H&P (Signed)
History and Physical    Dawn Prince FGH:[id number] DOB: 1960/08/31 DOA: 02/08/2021  PCP: Ernestene Kiel, MD  Patient coming from: Home  I have personally briefly reviewed patient's old medical records in [place]  Chief Complaint: Left-sided numbness  HPI: Dawn Prince is a 60 y.o. 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  Patient states she had a lower back injection on 01/31/2021 for management of chronic pain due to scoliosis.  She says she did have a headache afterwards which lasted about 4 days before resolving.  On 02/05/2021 she developed new sensory changes on the whole left side of her body.  She had decreased sensation/numbness most notably at the posterolateral aspect of her left upper extremity, left leg, and bottom of her left foot.  She has a mildly decreased sensation left side of her face as well as feeling of fullness in her left ear.  She did notice some mildly decreased left grip strength as well.    She otherwise did not have any significant change in strength.  She did not have any nausea, vomiting, change in vision, chest pain, palpitations, dyspnea, abdominal pain, dysuria, change in gait, lightheadedness/dizziness, fall, or loss of consciousness.  She is not currently taking any antiplatelets.  She has been on atorvastatin 20 mg daily.  She takes low-dose ramipril for years which she says was started due to proteinuria in the past.  ED Course:  Initial vitals showed BP 148/85, pulse 93, RR 16, temp 98.8 F, SPO2 99% on room air.  Labs show WBC 3.8, hemoglobin 11.7, platelets 222,000, sodium 139, potassium 4.2, bicarb 26, BUN 22, creatinine 1.01, serum glucose 102, AST 43, ALT 60, alk phos 62, total  bilirubin 0.4.  SARS-CoV-2 PCR panel collected and pending.  CT head without contrast was negative for acute infarct or hemorrhage, no mass-effect.  CT cervical spine is negative for acute cervical spine fracture.  Diffuse lymphadenopathy throughout the neck consistent with known B-cell lymphoma appears grossly stable compared to prior CT 10/11/2020.  Prominent spondylosis at C5-6 and C6-7 and mild anterolisthesis of C4 on C5 also noted.  MRI brain without contrast showed acute small vessel infarction of the posterolateral right thalamus.  Mild chronic small vessel ischemic changes affecting the cerebral hemispheric white matter also noted.  EDP discussed with on-call neurology who will evaluate.  The hospitalist service was consulted to admit for further evaluation and management.  Review of Systems: All systems reviewed and are negative except as documented in history of present illness above.   Past Medical History:  Diagnosis Date   Allergy    seasonal   Arthritis    Bipolar 1 disorder ([place])    Cancer ([place])    non hodgkins lymphoma   Depression    GERD (gastroesophageal reflux disease)    History of degenerative disc disease    Hyperlipidemia    Obstructive sleep apnea    Osteopenia    Scoliosis    Thyroid disease    hypothyroidism   Vitamin D deficiency     Past Surgical History:  Procedure Laterality Date   BREAST BIOPSY     CARPAL TUNNEL RELEASE Bilateral 2002   CESAREAN SECTION     x2   COLONOSCOPY  07/26/2008   Melanosis coli. Small internal hemorrhoids.  ENDOSCOPIC PLANTAR FASCIOTOMY     ESOPHAGOGASTRODUODENOSCOPY  03/17/2013   Mild gastritis. Status post esophageal dilatation.   LYMPH NODE BIOPSY     WISDOM TOOTH EXTRACTION      Social History:  reports that she has never smoked. She has never used smokeless tobacco. She reports that she does not currently use alcohol. She reports that she does not use drugs.  No Known Allergies  Family History   Problem Relation Age of Onset   Prostate cancer Father 62   Melanoma Father 13   Breast cancer Maternal Aunt    Multiple myeloma Maternal Aunt    Breast cancer Maternal Aunt    Colon cancer Neg Hx    Colon polyps Neg Hx    Esophageal cancer Neg Hx    Rectal cancer Neg Hx    Stomach cancer Neg Hx      Prior to Admission medications   Medication Sig Start Date End Date Taking? Authorizing Provider  ascorbic acid (VITAMIN C) 500 MG tablet Take 1 tablet by mouth daily. 06/20/20  Yes [provider]  atorvastatin (LIPITOR) 20 MG tablet Take 20 mg by mouth daily. 04/17/20  Yes [provider]  buPROPion (WELLBUTRIN) 75 MG tablet Take 75 mg by mouth 2 (two) times daily. 10/08/16  Yes [provider]  Calcium Carb-Cholecalciferol (CALCIUM CARBONATE-VITAMIN D3 PO) Take 1 tablet by mouth daily.   Yes [provider]  chlorpheniramine (CHLOR-TRIMETON) 4 MG tablet Take 4 mg by mouth daily as needed for allergies. 07/11/20  Yes [provider]  diclofenac (VOLTAREN) 75 MG EC tablet Take 75 mg by mouth 2 (two) times daily. 01/16/21  Yes [provider]  ELDERBERRY PO Take 1 tablet by mouth daily.   Yes [provider]  Esomeprazole Magnesium (NEXIUM 24HR PO) Take 1 capsule by mouth at bedtime.   Yes [provider]  FLUoxetine (PROZAC) 20 MG capsule Take 60 mg by mouth daily. 06/13/20  Yes [provider]  gabapentin (NEURONTIN) 100 MG capsule Take 200 mg by mouth 3 (three) times daily. Takes along with 600 mg capsule to total 800 mg 04/17/20  Yes [provider]  gabapentin (NEURONTIN) 300 MG capsule Take 600 mg by mouth 3 (three) times daily. Takes along with 200 mg capsule to total 800 mg 01/08/21  Yes [provider]  levothyroxine (SYNTHROID) 25 MCG tablet Take 25 mcg by mouth every morning. 06/13/20  Yes [provider]  Multiple Vitamin (MULTIVITAMIN ADULT PO) Take 1 capsule by mouth daily.  06/20/20  Yes [provider]  Omega 3 1000 MG CAPS Take 1,000 mg by mouth daily.   Yes [provider]  Probiotic Product (PROBIOTIC BLEND PO) Take 1 capsule by mouth daily.   Yes [provider]  QUEtiapine (SEROQUEL) 100 MG tablet Take 100 mg by mouth daily as needed (depression). 03/08/20  Yes [provider]  QUEtiapine (SEROQUEL) 300 MG tablet Take 300 mg by mouth at bedtime. 11/29/20  Yes [provider]  ramipril (ALTACE) 1.25 MG capsule Take 1.25 mg by mouth daily. 06/01/20  Yes [provider]  zinc gluconate 50 MG tablet Take 50 mg by mouth daily.   Yes [provider]    Physical Exam: Vitals:   02/08/21 1500 02/08/21 1545 02/08/21 1630 02/08/21 1715  BP: (!) 148/98 (!) 149/86 (!) 150/84 134/77  Pulse: 84 80 90 71  Resp: 18 (!) _0 Temp: 98.6 F (37 C)  TempSrc: Oral     SpO2: 99% 98% 98% 98%   Constitutional: Resting supine in bed, NAD, calm, comfortable Eyes: PERRL, EOMI, lids and conjunctivae normal ENMT: Mucous membranes are moist. Posterior pharynx clear of any exudate or lesions.Normal dentition.  Neck: normal, supple, no masses. Respiratory: clear to auscultation bilaterally, no wheezing, no crackles. Normal respiratory effort. No accessory muscle use.  Cardiovascular: Regular rate and rhythm, no murmurs / rubs / gallops. No extremity edema. 2+ pedal pulses. Abdomen: no tenderness, no masses palpated. No hepatosplenomegaly. Bowel sounds positive.  Musculoskeletal: no clubbing / cyanosis. No joint deformity upper and lower extremities. Good ROM, no contractures. Normal muscle tone.  Skin: no rashes, lesions, ulcers. No induration Neurologic: CN 2-12 grossly intact. Sensation slightly diminished in left face, left arm, and left leg when compared to the right. Strength 5/5 in all 4.  Psychiatric: Normal judgment and insight. Alert and oriented x 3. Normal mood.   Labs on Admission: I have personally  reviewed following labs and imaging studies  CBC: Recent Labs  Lab 02/08/21 1331  WBC 3.8*  NEUTROABS 2.2  HGB 11.7*  HCT 36.7  MCV 104.0*  PLT 419   Basic Metabolic Panel: Recent Labs  Lab 02/08/21 1331  NA 139  K 4.2  CL 108  CO2 26  GLUCOSE 102*  BUN 22  CREATININE 1.01*  CALCIUM 8.9   GFR: CrCl cannot be calculated (Unknown ideal weight.). Liver Function Tests: Recent Labs  Lab 02/08/21 1331  AST 43*  ALT 60*  ALKPHOS 62  BILITOT 0.4  PROT 6.4*  ALBUMIN 3.4*   No results for input(s): LIPASE, AMYLASE in the last 168 hours. No results for input(s): AMMONIA in the last 168 hours. Coagulation Profile: No results for input(s): INR, PROTIME in the last 168 hours. Cardiac Enzymes: No results for input(s): CKTOTAL, CKMB, CKMBINDEX, TROPONINI in the last 168 hours. BNP (last 3 results) No results for input(s): PROBNP in the last 8760 hours. HbA1C: No results for input(s): HGBA1C in the last 72 hours. CBG: No results for input(s): GLUCAP in the last 168 hours. Lipid Profile: No results for input(s): CHOL, HDL, LDLCALC, TRIG, CHOLHDL, LDLDIRECT in the last 72 hours. Thyroid Function Tests: No results for input(s): TSH, T4TOTAL, FREET4, T3FREE, THYROIDAB in the last 72 hours. Anemia Panel: No results for input(s): VITAMINB12, FOLATE, FERRITIN, TIBC, IRON, RETICCTPCT in the last 72 hours. Urine analysis: No results found for: COLORURINE, APPEARANCEUR, LABSPEC, PHURINE, GLUCOSEU, HGBUR, BILIRUBINUR, KETONESUR, PROTEINUR, UROBILINOGEN, NITRITE, LEUKOCYTESUR  Radiological Exams on Admission: CT HEAD WO CONTRAST (5MM)  Result Date: 02/08/2021 CLINICAL DATA:  Numbness and tingling, paresthesias, altered mental status EXAM: CT HEAD WITHOUT CONTRAST CT CERVICAL SPINE WITHOUT CONTRAST TECHNIQUE: Multidetector CT imaging of the head and cervical spine was performed following the standard protocol without intravenous contrast. Multiplanar CT image reconstructions of the  cervical spine were also generated. COMPARISON:  10/11/2020 FINDINGS: CT HEAD FINDINGS Brain: No acute infarct or hemorrhage. Lateral ventricles and midline structures are unremarkable. No acute extra-axial fluid collections. No mass effect. Vascular: No hyperdense vessel or unexpected calcification. Skull: Normal. Negative for fracture or focal lesion. Sinuses/Orbits: No acute finding. Other: None. CT CERVICAL SPINE FINDINGS Alignment: Mild anterolisthesis of C4 on C5 likely due to facet hypertrophy. Otherwise alignment is anatomic. Skull base and vertebrae: No acute fracture. No primary bone lesion or focal pathologic process. Soft tissues and spinal canal: Numerous borderline enlarged lymph nodes are seen throughout the neck compatible with patient's known history of B-cell  lymphoma. Index lymph nodes are as follows: Right level 2, image 43/6, 8 mm.  Stable. Right level 2, image 43/6, 6 mm, previously 7 mm. Left level 2, image 44/6, 8 mm.  Stable. Left supraclavicular mass, image 84/6, 36 x 17 mm.  Stable. Prevertebral and retropharyngeal soft tissues are unremarkable. Disc levels: There is prominent spondylosis at C5-6 and C6-7, with mild symmetrical neural foraminal encroachment most pronounced at C5-6. Mild diffuse facet hypertrophy greatest at C3-4 and C4-5. Upper chest: Airway is patent. Limited imaging through the lung apices are clear. Other: Reconstructed images demonstrate no additional findings. IMPRESSION: 1. No acute cervical spine fracture. 2. Mild anterolisthesis of C4 on C5, likely due to facet hypertrophy. 3. Prominent spondylosis at C5-6 and C6-7 with minimal neural foraminal encroachment. 4. Diffuse lymphadenopathy throughout the neck, consistent with B-cell lymphoma. Grossly stable since prior CT 10/11/2020. Electronically Signed   By: Randa Ngo M.D.   On: 02/08/2021 15:35   CT Cervical Spine Wo Contrast  Result Date: 02/08/2021 CLINICAL DATA:  Numbness and tingling, paresthesias,  altered mental status EXAM: CT HEAD WITHOUT CONTRAST CT CERVICAL SPINE WITHOUT CONTRAST TECHNIQUE: Multidetector CT imaging of the head and cervical spine was performed following the standard protocol without intravenous contrast. Multiplanar CT image reconstructions of the cervical spine were also generated. COMPARISON:  10/11/2020 FINDINGS: CT HEAD FINDINGS Brain: No acute infarct or hemorrhage. Lateral ventricles and midline structures are unremarkable. No acute extra-axial fluid collections. No mass effect. Vascular: No hyperdense vessel or unexpected calcification. Skull: Normal. Negative for fracture or focal lesion. Sinuses/Orbits: No acute finding. Other: None. CT CERVICAL SPINE FINDINGS Alignment: Mild anterolisthesis of C4 on C5 likely due to facet hypertrophy. Otherwise alignment is anatomic. Skull base and vertebrae: No acute fracture. No primary bone lesion or focal pathologic process. Soft tissues and spinal canal: Numerous borderline enlarged lymph nodes are seen throughout the neck compatible with patient's known history of B-cell lymphoma. Index lymph nodes are as follows: Right level 2, image 43/6, 8 mm.  Stable. Right level 2, image 43/6, 6 mm, previously 7 mm. Left level 2, image 44/6, 8 mm.  Stable. Left supraclavicular mass, image 84/6, 36 x 17 mm.  Stable. Prevertebral and retropharyngeal soft tissues are unremarkable. Disc levels: There is prominent spondylosis at C5-6 and C6-7, with mild symmetrical neural foraminal encroachment most pronounced at C5-6. Mild diffuse facet hypertrophy greatest at C3-4 and C4-5. Upper chest: Airway is patent. Limited imaging through the lung apices are clear. Other: Reconstructed images demonstrate no additional findings. IMPRESSION: 1. No acute cervical spine fracture. 2. Mild anterolisthesis of C4 on C5, likely due to facet hypertrophy. 3. Prominent spondylosis at C5-6 and C6-7 with minimal neural foraminal encroachment. 4. Diffuse lymphadenopathy throughout  the neck, consistent with B-cell lymphoma. Grossly stable since prior CT 10/11/2020. Electronically Signed   By: Randa Ngo M.D.   On: 02/08/2021 15:35   MR BRAIN WO CONTRAST  Result Date: 02/08/2021 CLINICAL DATA:  Neurological deficit, acute, stroke suspected. Left-sided numbness. EXAM: MRI HEAD WITHOUT CONTRAST TECHNIQUE: Multiplanar, multiecho pulse sequences of the brain and surrounding structures were obtained without intravenous contrast. COMPARISON:  Head CT same day. FINDINGS: Brain: Diffusion imaging shows an acute small vessel infarction in the right lateral thalamus posteriorly. No other acute infarction. Elsewhere, there are a few old small vessel infarctions of the deep white matter. No cortical or large vessel territory infarction. No mass lesion, hemorrhage, hydrocephalus or extra-axial collection. Vascular: Major vessels at the base of the brain  show flow. Skull and upper cervical spine: Negative Sinuses/Orbits: Clear/normal Other: None IMPRESSION: Acute small vessel infarction of the posterolateral right thalamus. Mild chronic small-vessel ischemic changes elsewhere affecting the cerebral hemispheric white matter. Electronically Signed   By: Nelson Chimes M.D.   On: 02/08/2021 18:13    EKG: Personally reviewed. Sinus tachycardia, rate 102, no acute ischemic changes.  No prior for comparison.  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  Acute posterolateral right thalamic infarct: Presenting with left-sided  numbness involving face, LUE, and LLE.  MRI brain shows acute small vessel infarction of the posterolateral right thalamus. -Appreciate neurology assistance -Will follow neurology recommendations for further imaging studies -Started on aspirin 325 mg once followed by 81 mg daily -Plavix 300 mg once followed by 75 mg daily -Echocardiogram -Check A1c, lipid panel -Continue neurochecks, keep on telemetry -Continue atorvastatin 20 mg daily -PT/OT/SLP eval  Stage IIIb small lymphocytic lymphoma: On active observation.  Follows with medical oncology, Dr. Hinton Rao.  CT cervical spine shows diffuse lymphadenopathy throughout the neck consistent with known B-cell lymphoma, stable compared to prior CT in May 2022.  Mild leukopenia noted, fluctuates up and down with normal B12 and folate levels per recent oncology note.  Hypertension: BP mildly elevated on arrival.  Out of permissive hypertension window.  Continue ramipril 1.25 mg daily.  Hyperlipidemia: Continue atorvastatin 20 mg daily.  Hypothyroidism: Continue Synthroid.  Chronic back pain/sciatica: Continue gabapentin.  Mood disorder: Continue home Seroquel, Prozac, Wellbutrin.  DVT prophylaxis: Lovenox Code Status: Full code, confirmed with patient Family Communication: Discussed with patient, she has discussed with family Disposition Plan: From home, dispo pending clinical progress Consults called: Neurology Level of care: Telemetry Medical Admission status:  Status is: Observation  The patient remains OBS appropriate and will d/c before 2 midnights.  Dispo: The patient is from: Home              Anticipated d/c is to: Home              Patient currently is not medically stable to d/c.   Zada Finders MD [facility]  If 7PM-7AM, please contact night-coverage [email or web address]  02/08/2021, 7:53 PM

## 2021-02-08 NOTE — Consult Note (Signed)
Neurology Consultation Reason for Consult: Stroke on MRI  Requesting Physician: Zada Finders   CC: numbness/tingling of the left hemibody since Monday 9/12  History is obtained from: Patient and chart review  HPI: Dawn Prince is a 61 y.o. 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  She does not have any clear numbness and temperature sensation is similar on both sides but on light touch she has paresthesias.  Additionally she noted she had 3 to 4 days of headache last week that was her typical bifrontal headache (denies any alarm signs including positional quality or vision changes) as well as some buzzing sensation in her left ear for 1 day.  However she denies any weakness.  She notes she has had worsening balance for the last few months but no falls.  Her B symptoms of generalized weakness and sweats have been stable.  LKW: Monday 9/12 tPA given?: No, due to out of the window Premorbid modified rankin scale:      1 - No significant disability. Able to carry out all usual activities, despite some symptoms. (Sciatica and B symptoms from her lymphoma)  ROS: All other review of systems was negative except as noted in the HPI.   Past Medical History:  Diagnosis Date   Allergy    seasonal   Arthritis    Bipolar 1 disorder ([place])    Cancer ([place])    non hodgkins lymphoma   Depression    GERD (gastroesophageal reflux disease)    History of degenerative disc disease    Hyperlipidemia    Obstructive sleep apnea    Osteopenia    Scoliosis    Thyroid disease    hypothyroidism    Vitamin D deficiency    Past Surgical History:  Procedure Laterality Date   BREAST BIOPSY     CARPAL TUNNEL RELEASE Bilateral 2002   CESAREAN SECTION     x2   COLONOSCOPY  07/26/2008   Melanosis coli. Small internal hemorrhoids.    ENDOSCOPIC PLANTAR FASCIOTOMY     ESOPHAGOGASTRODUODENOSCOPY  03/17/2013   Mild gastritis. Status post esophageal dilatation.   LYMPH NODE BIOPSY     WISDOM TOOTH EXTRACTION     No current facility-administered medications for this encounter.  Current Outpatient Medications:    ascorbic acid (VITAMIN C) 500 MG tablet, Take 1 tablet by mouth daily., Disp: , Rfl:    atorvastatin (LIPITOR) 20 MG tablet, Take 20 mg by mouth daily., Disp: , Rfl:    buPROPion (WELLBUTRIN) 75 MG tablet, Take 75 mg by mouth 2 (two) times daily., Disp: , Rfl:    Calcium Carb-Cholecalciferol (CALCIUM CARBONATE-VITAMIN D3 PO), Take 1 tablet by mouth daily., Disp: , Rfl:    chlorpheniramine (CHLOR-TRIMETON) 4 MG tablet, Take 4 mg by mouth daily as needed for allergies., Disp: , Rfl:    diclofenac (VOLTAREN) 75 MG EC tablet, Take 75 mg by mouth 2 (two) times daily., Disp: , Rfl:    ELDERBERRY PO, Take 1 tablet by mouth daily., Disp: , Rfl:    Esomeprazole Magnesium (NEXIUM 24HR PO), Take 1 capsule by mouth at bedtime., Disp: , Rfl:  FLUoxetine (PROZAC) 20 MG capsule, Take 60 mg by mouth daily., Disp: , Rfl:    gabapentin (NEURONTIN) 100 MG capsule, Take 200 mg by mouth 3 (three) times daily. Takes along with 600 mg capsule to total 800 mg, Disp: , Rfl:    gabapentin (NEURONTIN) 300 MG capsule, Take 600 mg by mouth 3 (three) times daily. Takes along with 200 mg capsule to total 800 mg, Disp: , Rfl:    levothyroxine (SYNTHROID) 25 MCG tablet, Take 25 mcg by mouth every morning., Disp: , Rfl:    Multiple Vitamin (MULTIVITAMIN ADULT PO), Take 1 capsule by mouth daily., Disp: , Rfl:    Omega 3 1000 MG CAPS, Take 1,000 mg by mouth daily., Disp: , Rfl:    Probiotic Product (PROBIOTIC  BLEND PO), Take 1 capsule by mouth daily., Disp: , Rfl:    QUEtiapine (SEROQUEL) 100 MG tablet, Take 100 mg by mouth daily as needed (depression)., Disp: , Rfl:    QUEtiapine (SEROQUEL) 300 MG tablet, Take 300 mg by mouth at bedtime., Disp: , Rfl:    ramipril (ALTACE) 1.25 MG capsule, Take 1.25 mg by mouth daily., Disp: , Rfl:    zinc gluconate 50 MG tablet, Take 50 mg by mouth daily., Disp: , Rfl:    Family History  Problem Relation Age of Onset   Prostate cancer Father 43   Melanoma Father 22   Breast cancer Maternal Aunt    Multiple myeloma Maternal Aunt    Breast cancer Maternal Aunt    Colon cancer Neg Hx    Colon polyps Neg Hx    Esophageal cancer Neg Hx    Rectal cancer Neg Hx    Stomach cancer Neg Hx    Social History:  reports that she has never smoked. She has never used smokeless tobacco. She reports that she does not currently use alcohol. She reports that she does not use drugs.   Exam: Current vital signs: BP 134/77   Pulse 71   Temp 98.6 F (37 C) (Oral)   Resp 18   SpO2 98%  Vital signs in last 24 hours: Temp:  [98.6 F (37 C)-98.8 F (37.1 C)] 98.6 F (37 C) (09/15 1500) Pulse Rate:  [71-93] 71 (09/15 1715) Resp:  [16-22] 18 (09/15 1715) BP: (134-150)/(77-98) 134/77 (09/15 1715) SpO2:  [98 %-99 %] 98 % (09/15 1715)   Physical Exam  Constitutional: Appears well-developed and well-nourished.  Psych: Affect appropriate to situation, calm and cooperative Eyes: No scleral injection HENT: No oropharyngeal obstruction.  MSK: no joint deformities.  Cardiovascular: Normal rate and regular rhythm.  Respiratory: Effort normal, non-labored breathing GI: Soft.  No distension. There is no tenderness.  Skin: Warm dry and intact visible skin.  She does have some mild increased edema of the right lower extremity greater than left lower extremity which she reports is chronic  Neuro: Mental Status: Patient is awake, alert, oriented to person, place, month, year,  and situation. Patient is able to give a clear and coherent history. No signs of aphasia or neglect Cranial Nerves: II: Visual Fields are full. Pupils are equal, round, and reactive to light.   III,IV, VI: EOMI without ptosis or diploplia.  V: Facial sensation is symmetric to temperature VII: Facial movement is symmetric.  VIII: hearing is intact to voice X: Uvula elevates symmetrically XI: Shoulder shrug is symmetric. XII: tongue is midline without atrophy or fasciculations.  Motor: Tone is normal. Bulk is normal. 5/5 strength was present in all four extremities.  Sensory: Sensation is notable for paresthesias to light touch on the left face arm and leg and symmetric temperature sensation bilaterally in the arms and legs.  Proprioception is also intact Deep Tendon Reflexes: 2+ and symmetric in the biceps and patellae and Achilles Plantars: Toes are mute bilaterally.  Cerebellar: FNF and HKS are intact bilaterally  NIHSS total 1 Score breakdown: Paresthesias on the left side    I have reviewed labs in epic and the results pertinent to this consultation are: Cr 1.0 up from baseline of 0.7-0.9 AST / ALT increased from baselin of 30s to 43 and 60 respectively today  CBC with stable leukopenia (3.9), mild anemia (11.7 Hgb), macrocytosis (104) LDH 166 on 01/25/21 ECG sinus tachycardia (102  HR)  No results found for: CHOL, HDL, LDLCALC, LDLDIRECT, TRIG, CHOLHDL No results found for: HGBA1C  Labs pending  I have reviewed the images obtained:  CT head personally reviewed, agree with radiology Brain: No acute infarct or hemorrhage. Lateral ventricles and midline structures are unremarkable. No acute extra-axial fluid collections. No mass effect. Vascular: No hyperdense vessel or unexpected calcification. Skull: Normal. Negative for fracture or focal lesion. Sinuses/Orbits: No acute finding. Other: None.  CT spine personally reviewed, agree with radiology 1. No acute cervical  spine fracture. 2. Mild anterolisthesis of C4 on C5, likely due to facet hypertrophy. 3. Prominent spondylosis at C5-6 and C6-7 with minimal neural foraminal encroachment. 4. Diffuse lymphadenopathy throughout the neck, consistent with B-cell lymphoma. Grossly stable since prior CT 10/11/2020.  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 Mild chronic small-vessel ischemic changes elsewhere affecting the cerebral hemispheric white matter.  Impression: 59 year old woman with stroke risk factors of hyperlipidemia and obstructive sleep apnea also with a medical history significant for lymphoma being conservatively managed.  Stroke is certainly on the differential for her brain lesion, but given her history of malignancy, MRI with contrast should be obtained to evaluate for potential metastatic spread.  Recommendations:  # Right thalamic likely small vessel etiology stroke - Stroke labs HgbA1c, fasting lipid panel - MRI brain with contrast given history of malignancy (ordered)  - CTA head and neck with contrast (ordered) - Frequent neuro checks - Echocardiogram - Prophylactic therapy-Antiplatelet med: Aspirin - dose 390m PO or 3053mPR, followed by 81 mg daily - Plavix 300 mg load with 75 mg daily for 21, may be extended pending vessel imaging initiated by hospitalist, reasonable to continue at this time - Risk factor modification - Telemetry monitoring - Blood pressure goal   - Normotension long-term blood pressure goal given out of the permissive hypertension window, please gradually normalize blood pressure over several days - PT consult, OT consult, Speech consult not indicated given the patient's only deficit is sensory - Stroke team to follow  SrLesleigh NoeD-PhD [facility] [phone] 7 PM to 7 AM, outside of these hours please call Neurologist on call as listed on Amion.

## 2021-02-08 NOTE — ED Triage Notes (Signed)
Pt states she started having numbness/tingling on her entire left side on Monday.She had a spinal injection on Wednesday for her lumbar back pain. Speech is clear, grips equal.

## 2021-02-09 ENCOUNTER — Observation Stay (HOSPITAL_COMMUNITY): Payer: Managed Care, Other (non HMO)

## 2021-02-09 DIAGNOSIS — I6389 Other cerebral infarction: Secondary | ICD-10-CM | POA: Diagnosis not present

## 2021-02-09 DIAGNOSIS — I639 Cerebral infarction, unspecified: Secondary | ICD-10-CM | POA: Diagnosis not present

## 2021-02-09 LAB — CBC
HCT: 34.1 % — ABNORMAL LOW (ref 36.0–46.0)
Hemoglobin: 11.1 g/dL — ABNORMAL LOW (ref 12.0–15.0)
MCH: 33.4 pg (ref 26.0–34.0)
MCHC: 32.6 g/dL (ref 30.0–36.0)
MCV: 102.7 fL — ABNORMAL HIGH (ref 80.0–100.0)
Platelets: 206 10*3/uL (ref 150–400)
RBC: 3.32 MIL/uL — ABNORMAL LOW (ref 3.87–5.11)
RDW: 13.9 % (ref 11.5–15.5)
WBC: 3.8 10*3/uL — ABNORMAL LOW (ref 4.0–10.5)
nRBC: 0 % (ref 0.0–0.2)

## 2021-02-09 LAB — LIPID PANEL
Cholesterol: 145 mg/dL (ref 0–200)
HDL: 53 mg/dL (ref >40)
LDL Cholesterol: 78 mg/dL (ref 0–99)
Total CHOL/HDL Ratio: 2.7 RATIO
Triglycerides: 72 mg/dL (ref <150)
VLDL: 14 mg/dL (ref 0–40)

## 2021-02-09 LAB — COMPREHENSIVE METABOLIC PANEL
ALT: 45 U/L — ABNORMAL HIGH (ref 0–44)
AST: 30 U/L (ref 15–41)
Albumin: 2.9 g/dL — ABNORMAL LOW (ref 3.5–5.0)
Alkaline Phosphatase: 49 U/L (ref 38–126)
Anion gap: 7 (ref 5–15)
BUN: 20 mg/dL (ref 8–23)
CO2: 26 mmol/L (ref 22–32)
Calcium: 8.8 mg/dL — ABNORMAL LOW (ref 8.9–10.3)
Chloride: 103 mmol/L (ref 98–111)
Creatinine, Ser: 1.01 mg/dL — ABNORMAL HIGH (ref 0.44–1.00)
GFR, Estimated: >60 mL/min (ref >60)
Glucose, Bld: 101 mg/dL — ABNORMAL HIGH (ref 70–99)
Potassium: 3.7 mmol/L (ref 3.5–5.1)
Sodium: 136 mmol/L (ref 135–145)
Total Bilirubin: 0.7 mg/dL (ref 0.3–1.2)
Total Protein: 5.6 g/dL — ABNORMAL LOW (ref 6.5–8.1)

## 2021-02-09 LAB — HIV ANTIBODY (ROUTINE TESTING W REFLEX): HIV Screen 4th Generation wRfx: NONREACTIVE

## 2021-02-09 LAB — HEMOGLOBIN A1C
Hgb A1c MFr Bld: 6.1 % — ABNORMAL HIGH (ref 4.8–5.6)
Mean Plasma Glucose: 128.37 mg/dL

## 2021-02-09 LAB — ECHOCARDIOGRAM COMPLETE
Area-P 1/2: 5.09 cm2
Height: 61 in
S' Lateral: 2.4 cm
Weight: 2160 oz

## 2021-02-09 MED ORDER — IOHEXOL 350 MG/ML SOLN
80.0000 mL | Freq: Once | INTRAVENOUS | Status: AC | PRN
  Administered 2021-02-09: 80 mL via INTRAVENOUS

## 2021-02-09 MED ORDER — GADOBUTROL 1 MMOL/ML IV SOLN
6.0000 mL | Freq: Once | INTRAVENOUS | Status: AC | PRN
  Administered 2021-02-09: 6 mL via INTRAVENOUS

## 2021-02-09 NOTE — Progress Notes (Signed)
STROKE TEAM PROGRESS NOTE   INTERVAL HISTORY No acute events Describes onset 4 days ago of left face, arm, leg sensation impairment and tingling along. She also noticed a new noise/sensation ongoing in her left ear which is like "paper being crunched". She thought her symptoms were from her recent back injections and did not immediately seek care. She also reports some balance impairment for the past few months.  Has had pre diabetes but was able to get her A1C down with diet and exercise. Taking Lipitor '20mg'$  prior to admission.  We discussed her stroke diagnosis, work up including old CVAs on imaging and plan of care. Lifestyle changes for stroke risk factor reduction explained and recommended. Questions were answered. Her husband, Elta Guadeloupe, was at the bedside for the discussion.  MRI scan shows small right thalamic lacunar infarct and CT angiogram of brain and neck showed no significant large vessel stenosis or occlusion.  LDL cholesterol is 78 mg percent and hemoglobin A1c 6.1. Vitals:   02/09/21 0500 02/09/21 0600 02/09/21 0645 02/09/21 0835  BP: 96/70 114/76  134/80  Pulse: 69 68 68 74  Resp: 12 (!) '25 16 18  '$ Temp:    97.9 F (36.6 C)  TempSrc:    Oral  SpO2: 98% 96% 92% 100%  Weight:      Height:       CBC:  Recent Labs  Lab 02/08/21 1331 02/09/21 0334  WBC 3.8* 3.8*  NEUTROABS 2.2  --   HGB 11.7* 11.1*  HCT 36.7 34.1*  MCV 104.0* 102.7*  PLT 222 [id number]   Basic Metabolic Panel:  Recent Labs  Lab 02/08/21 1331 02/09/21 0334  NA 139 136  K 4.2 3.7  CL 108 103  CO2 26 26  GLUCOSE 102* 101*  BUN 22 20  CREATININE 1.01* 1.01*  CALCIUM 8.9 8.8*   Lipid Panel:  Recent Labs  Lab 02/09/21 0334  CHOL 145  TRIG 72  HDL 53  CHOLHDL 2.7  VLDL 14  LDLCALC 78   HgbA1c:  Recent Labs  Lab 02/09/21 0334  HGBA1C 6.1*   Urine Drug Screen: No results for input(s): LABOPIA, COCAINSCRNUR, LABBENZ, AMPHETMU, THCU, LABBARB in the last 168 hours.  Alcohol Level No results for  input(s): ETH in the last 168 hours.  IMAGING past 24 hours CT ANGIO HEAD W OR WO CONTRAST  Result Date: 02/09/2021 CLINICAL DATA:  Stroke EXAM: CT ANGIOGRAPHY HEAD AND NECK TECHNIQUE: Multidetector CT imaging of the head and neck was performed using the standard protocol during bolus administration of intravenous contrast. Multiplanar CT image reconstructions and MIPs were obtained to evaluate the vascular anatomy. Carotid stenosis measurements (when applicable) are obtained utilizing NASCET criteria, using the distal internal carotid diameter as the denominator. CONTRAST:  50m OMNIPAQUE IOHEXOL 350 MG/ML SOLN COMPARISON:  None. FINDINGS: CTA NECK FINDINGS SKELETON: There is no bony spinal canal stenosis. No lytic or blastic lesion. OTHER NECK: Normal pharynx, larynx and major salivary glands. No cervical lymphadenopathy. Unremarkable thyroid gland. UPPER CHEST: No pneumothorax or pleural effusion. No nodules or masses. AORTIC ARCH: There is no calcific atherosclerosis of the aortic arch. There is no aneurysm, dissection or hemodynamically significant stenosis of the visualized portion of the aorta. Conventional 3 vessel aortic branching pattern. The visualized proximal subclavian arteries are widely patent. RIGHT CAROTID SYSTEM: Normal without aneurysm, dissection or stenosis. LEFT CAROTID SYSTEM: Normal without aneurysm, dissection or stenosis. VERTEBRAL ARTERIES: Left dominant configuration. Both origins are clearly patent. There is no dissection, occlusion or flow-limiting  stenosis to the skull base (V1-V3 segments). CTA HEAD FINDINGS POSTERIOR CIRCULATION: --Vertebral arteries: Normal V4 segments. --Inferior cerebellar arteries: Normal. --Basilar artery: Normal. --Superior cerebellar arteries: Normal. --Posterior cerebral arteries (PCA): Normal. ANTERIOR CIRCULATION: --Intracranial internal carotid arteries: Normal. --Anterior cerebral arteries (ACA): Normal. Both A1 segments are present. Patent anterior  communicating artery (a-comm). --Middle cerebral arteries (MCA): Normal. VENOUS SINUSES: As permitted by contrast timing, patent. ANATOMIC VARIANTS: None Review of the MIP images confirms the above findings. IMPRESSION: Normal CTA of the head and neck. Electronically Signed   By: Ulyses Jarred M.D.   On: 02/09/2021 03:35   CT HEAD WO CONTRAST (5MM)  Result Date: 02/08/2021 CLINICAL DATA:  Numbness and tingling, paresthesias, altered mental status EXAM: CT HEAD WITHOUT CONTRAST CT CERVICAL SPINE WITHOUT CONTRAST TECHNIQUE: Multidetector CT imaging of the head and cervical spine was performed following the standard protocol without intravenous contrast. Multiplanar CT image reconstructions of the cervical spine were also generated. COMPARISON:  10/11/2020 FINDINGS: CT HEAD FINDINGS Brain: No acute infarct or hemorrhage. Lateral ventricles and midline structures are unremarkable. No acute extra-axial fluid collections. No mass effect. Vascular: No hyperdense vessel or unexpected calcification. Skull: Normal. Negative for fracture or focal lesion. Sinuses/Orbits: No acute finding. Other: None. CT CERVICAL SPINE FINDINGS Alignment: Mild anterolisthesis of C4 on C5 likely due to facet hypertrophy. Otherwise alignment is anatomic. Skull base and vertebrae: No acute fracture. No primary bone lesion or focal pathologic process. Soft tissues and spinal canal: Numerous borderline enlarged lymph nodes are seen throughout the neck compatible with patient's known history of B-cell lymphoma. Index lymph nodes are as follows: Right level 2, image 43/6, 8 mm.  Stable. Right level 2, image 43/6, 6 mm, previously 7 mm. Left level 2, image 44/6, 8 mm.  Stable. Left supraclavicular mass, image 84/6, 36 x 17 mm.  Stable. Prevertebral and retropharyngeal soft tissues are unremarkable. Disc levels: There is prominent spondylosis at C5-6 and C6-7, with mild symmetrical neural foraminal encroachment most pronounced at C5-6. Mild diffuse  facet hypertrophy greatest at C3-4 and C4-5. Upper chest: Airway is patent. Limited imaging through the lung apices are clear. Other: Reconstructed images demonstrate no additional findings. IMPRESSION: 1. No acute cervical spine fracture. 2. Mild anterolisthesis of C4 on C5, likely due to facet hypertrophy. 3. Prominent spondylosis at C5-6 and C6-7 with minimal neural foraminal encroachment. 4. Diffuse lymphadenopathy throughout the neck, consistent with B-cell lymphoma. Grossly stable since prior CT 10/11/2020. Electronically Signed   By: Randa Ngo M.D.   On: 02/08/2021 15:35   CT ANGIO NECK W OR WO CONTRAST  Result Date: 02/09/2021 CLINICAL DATA:  Stroke EXAM: CT ANGIOGRAPHY HEAD AND NECK TECHNIQUE: Multidetector CT imaging of the head and neck was performed using the standard protocol during bolus administration of intravenous contrast. Multiplanar CT image reconstructions and MIPs were obtained to evaluate the vascular anatomy. Carotid stenosis measurements (when applicable) are obtained utilizing NASCET criteria, using the distal internal carotid diameter as the denominator. CONTRAST:  70m OMNIPAQUE IOHEXOL 350 MG/ML SOLN COMPARISON:  None. FINDINGS: CTA NECK FINDINGS SKELETON: There is no bony spinal canal stenosis. No lytic or blastic lesion. OTHER NECK: Normal pharynx, larynx and major salivary glands. No cervical lymphadenopathy. Unremarkable thyroid gland. UPPER CHEST: No pneumothorax or pleural effusion. No nodules or masses. AORTIC ARCH: There is no calcific atherosclerosis of the aortic arch. There is no aneurysm, dissection or hemodynamically significant stenosis of the visualized portion of the aorta. Conventional 3 vessel aortic branching pattern. The visualized proximal  subclavian arteries are widely patent. RIGHT CAROTID SYSTEM: Normal without aneurysm, dissection or stenosis. LEFT CAROTID SYSTEM: Normal without aneurysm, dissection or stenosis. VERTEBRAL ARTERIES: Left dominant  configuration. Both origins are clearly patent. There is no dissection, occlusion or flow-limiting stenosis to the skull base (V1-V3 segments). CTA HEAD FINDINGS POSTERIOR CIRCULATION: --Vertebral arteries: Normal V4 segments. --Inferior cerebellar arteries: Normal. --Basilar artery: Normal. --Superior cerebellar arteries: Normal. --Posterior cerebral arteries (PCA): Normal. ANTERIOR CIRCULATION: --Intracranial internal carotid arteries: Normal. --Anterior cerebral arteries (ACA): Normal. Both A1 segments are present. Patent anterior communicating artery (a-comm). --Middle cerebral arteries (MCA): Normal. VENOUS SINUSES: As permitted by contrast timing, patent. ANATOMIC VARIANTS: None Review of the MIP images confirms the above findings. IMPRESSION: Normal CTA of the head and neck. Electronically Signed   By: Ulyses Jarred M.D.   On: 02/09/2021 03:35   CT Cervical Spine Wo Contrast  Result Date: 02/08/2021 CLINICAL DATA:  Numbness and tingling, paresthesias, altered mental status EXAM: CT HEAD WITHOUT CONTRAST CT CERVICAL SPINE WITHOUT CONTRAST TECHNIQUE: Multidetector CT imaging of the head and cervical spine was performed following the standard protocol without intravenous contrast. Multiplanar CT image reconstructions of the cervical spine were also generated. COMPARISON:  10/11/2020 FINDINGS: CT HEAD FINDINGS Brain: No acute infarct or hemorrhage. Lateral ventricles and midline structures are unremarkable. No acute extra-axial fluid collections. No mass effect. Vascular: No hyperdense vessel or unexpected calcification. Skull: Normal. Negative for fracture or focal lesion. Sinuses/Orbits: No acute finding. Other: None. CT CERVICAL SPINE FINDINGS Alignment: Mild anterolisthesis of C4 on C5 likely due to facet hypertrophy. Otherwise alignment is anatomic. Skull base and vertebrae: No acute fracture. No primary bone lesion or focal pathologic process. Soft tissues and spinal canal: Numerous borderline enlarged  lymph nodes are seen throughout the neck compatible with patient's known history of B-cell lymphoma. Index lymph nodes are as follows: Right level 2, image 43/6, 8 mm.  Stable. Right level 2, image 43/6, 6 mm, previously 7 mm. Left level 2, image 44/6, 8 mm.  Stable. Left supraclavicular mass, image 84/6, 36 x 17 mm.  Stable. Prevertebral and retropharyngeal soft tissues are unremarkable. Disc levels: There is prominent spondylosis at C5-6 and C6-7, with mild symmetrical neural foraminal encroachment most pronounced at C5-6. Mild diffuse facet hypertrophy greatest at C3-4 and C4-5. Upper chest: Airway is patent. Limited imaging through the lung apices are clear. Other: Reconstructed images demonstrate no additional findings. IMPRESSION: 1. No acute cervical spine fracture. 2. Mild anterolisthesis of C4 on C5, likely due to facet hypertrophy. 3. Prominent spondylosis at C5-6 and C6-7 with minimal neural foraminal encroachment. 4. Diffuse lymphadenopathy throughout the neck, consistent with B-cell lymphoma. Grossly stable since prior CT 10/11/2020. Electronically Signed   By: Randa Ngo M.D.   On: 02/08/2021 15:35   MR BRAIN WO CONTRAST  Result Date: 02/08/2021 CLINICAL DATA:  Neurological deficit, acute, stroke suspected. Left-sided numbness. EXAM: MRI HEAD WITHOUT CONTRAST TECHNIQUE: Multiplanar, multiecho pulse sequences of the brain and surrounding structures were obtained without intravenous contrast. COMPARISON:  Head CT same day. FINDINGS: Brain: Diffusion imaging shows an acute small vessel infarction in the right lateral thalamus posteriorly. No other acute infarction. Elsewhere, there are a few old small vessel infarctions of the deep white matter. No cortical or large vessel territory infarction. No mass lesion, hemorrhage, hydrocephalus or extra-axial collection. Vascular: Major vessels at the base of the brain show flow. Skull and upper cervical spine: Negative Sinuses/Orbits: Clear/normal Other:  None IMPRESSION: Acute small vessel infarction of the posterolateral  right thalamus. Mild chronic small-vessel ischemic changes elsewhere affecting the cerebral hemispheric white matter. Electronically Signed   By: Nelson Chimes M.D.   On: 02/08/2021 18:13    PHYSICAL EXAM Pleasant frail middle-aged Caucasian lady not in distress. . Afebrile. Head is nontraumatic. Neck is supple without bruit.    Cardiac exam no murmur or gallop. Lungs are clear to auscultation. Distal pulses are well felt.  Neurological Exam ;  Awake  Alert oriented x 3. Normal speech and language.eye movements full without nystagmus.fundi were not visualized. Vision acuity and fields appear normal. Hearing is normal. Palatal movements are normal. Face symmetric. Tongue midline. Normal strength, tone, reflexes and coordination.  Subjectively diminished left face and body touch pinprick sensation. Gait deferred.  ASSESSMENT/PLAN  [name] is a 60 y.o. 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     Stroke: 60 year old woman with stroke risk factors of hyperlipidemia and obstructive sleep apnea also with a medical history significant for lymphoma being conservatively managed.   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  CT spine  No acute cervical spine fracture. Mild anterolisthesis of C4 on C5, likely due to facet hypertrophy. 3Prominent spondylosis at C5-6 and C6-7 with minimal neural foraminal encroachment Diffuse lymphadenopathy throughout the neck, consistent with B-cell lymphoma. Grossly stable since prior CT 10/11/2020. MRI brain  Acute  small vessel infarction of the posterolateral right thalamus. Mild chronic small-vessel ischemic changes elsewhere affecting the cerebral hemispheric white matter. 2D Echo PENDING LDL 78 HgbA1c 6.1 VTE prophylaxis - likely discharging today, but recommended otherwise    Diet   DIET SOFT Room service appropriate? Yes; Fluid consistency: Thin   NO AC/AP prior to admission Recommend DAPT with ASA '81mg'$  Plavix '75mg'$  x 3 weeks then ASA alone Therapy recommendations:  TBD Disposition:  Home  Follow up 2 months with Dr. Leonie Man  Hypertension Home meds:  Low dose Altace was started in the past due to proteinuria  Stable Permissive hypertension (OK if < 220/120) but gradually normalize in 5-7 days Long-term BP goal normotensive  Hyperlipidemia Home meds:  Lipitor '20mg'$   LDL 78, goal < 70 High intensity statin: Increase Lipitor to '40mg'$   Continue statin at discharge  History of Pre-Diabetes Home meds:  None HgbA1c 6.1, goal < 7.0 Management per primary team  Other Stroke Risk Factors Stroke mother and grandmother   Other [facility] day # 0  This patient was seen and evaluated with Dr. Leonie Man. He directed the plan of care.  Charlene Brooke, NP-C Stroke MD Note;  I have personally obtained history,examined this patient, reviewed notes, independently viewed imaging studies, participated in medical decision making and plan of care.ROS completed by me personally and pertinent positives fully documented  I have made any additions or clarifications directly to the above note. Agree with note above.  She presented with subacute left body paresthesias secondary to right thalamic infarct from small vessel disease.  Neurovascular imaging unremarkable.  Echocardiogram is pending.  LDL minimally elevated.  Recommend aspirin Plavix for 3 weeks followed by aspirin alone and increased dose of home statin.  Aggressive risk factor modification.  Long discussion with patient and her  husband  and answered questions.  Discussed with Dr. Candiss Norse.  Greater than 50% time during this 35-minute visit was spent approximate coordination of care and discussion with care team.  Follow-up as an outpatient stroke clinic in 2 months.  Stroke team will sign off.  Kindly call for questions.  Antony Contras, MD Medical Director [facility] Pager: [phone] 02/09/2021 3:38 PM  To contact Stroke Continuity provider, please refer to [email or web address] After hours, contact General Neurology

## 2021-02-09 NOTE — ED Notes (Signed)
This RN noticed pt passed her swallow screen and had a heart healthy diet but then it was discontinued. This RN was unsure if this was an accident because isolation orders were also being discontinued at the same time. This RN reached out to the admitting doctor, Dr. Candiss Norse, who stated that if she passed swallow screen and was alert enough pt could have soft diet placed. This RN placed diet order and will continue to monitor.

## 2021-02-09 NOTE — ED Notes (Signed)
Attempted report X1

## 2021-02-09 NOTE — Progress Notes (Signed)
  Echocardiogram 2D Echocardiogram has been performed.  Darlina Sicilian M 02/09/2021, 3:30 PM

## 2021-02-09 NOTE — Progress Notes (Signed)
PROGRESS NOTE                                                                                                                                                                                                             Patient Demographics:    Dawn Prince, is a 60 y.o. female, DOB - 26-Jan-1961, [id number]  Outpatient Primary MD for the patient is Ernestene Kiel, MD    LOS - 0  Admit date - 02/08/2021    Chief Complaint  Patient presents with   Numbness       Brief Narrative (HPI from H&P)  - Dawn Prince Code is a 60 y.o. 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   Subjective:    Dawn Prince today has, No headache, No chest pain, No abdominal pain - No Nausea, No new weakness tingling or numbness, no SOB.   Assessment  & Plan :   L arm Weakness due to CVA likely on 02/05/21 - MRI +ve for  Acute small vessel infarction of the posterolateral right thalamus - Neuro on board, CVA pathway underway, DAPT + Statin.  2. Stage IIIb small lymphocytic lymphoma: On active observation.  Follows with medical oncology, Dr. Hinton Rao.  CT cervical spine shows diffuse lymphadenopathy throughout the neck consistent with known B-cell lymphoma, stable compared to prior CT in May 2022.  Mild leukopenia noted, fluctuates up and down with normal B12 and folate levels per recent oncology note.  3. HTN - CVA likely > 35 days old, BP borderline, DC low dose ACE for now.  4.  Hypothyroidism.  On Synthroid.  5.  Dyslipidemia.  On statin.  6.  Chronic low back pain.  On Neurontin continue.  7.  Mood disorder.  Continue combination of  Seroquel, Prozac and Wellbutrin.  Lab Results  Component Value Date   HGBA1C 6.1 (H) 02/09/2021   Lab Results  Component Value Date   CHOL 145 02/09/2021   HDL 53 02/09/2021   LDLCALC 78 02/09/2021   TRIG 72 02/09/2021   CHOLHDL 2.7 02/09/2021         Condition - Fair  Family Communication  :  none present  Code Status :  Full  Consults  :  Neuro  PUD Prophylaxis :  PPI   Procedures  :     MRI brain with and without contrast.  Acute small vessel infarction of the posterolateral right thalamus. Mild chronic small-vessel ischemic changes elsewhere affecting the cerebral hemispheric white matter.  TTE  CT Abd Pelvis  CTA Head and Neck - Non acute      Disposition Plan  :    Status is: Observation  Dispo: The patient is from: Home              Anticipated d/c is to: Home              Patient currently is not medically stable to d/c.   Difficult to place patient No   DVT Prophylaxis  :    enoxaparin (LOVENOX) injection 40 mg Start: 02/08/21 2045   Lab Results  Component Value Date   PLT 206 02/09/2021    Diet :  Diet Order             DIET SOFT Room service appropriate? Yes; Fluid consistency: Thin  Diet effective now                    Inpatient Medications  Scheduled Meds:   stroke: mapping our early stages of recovery book   Does not apply Once   aspirin EC  81 mg Oral Daily   atorvastatin  20 mg Oral Daily   buPROPion  75 mg Oral BID   clopidogrel  75 mg Oral Daily   enoxaparin (LOVENOX) injection  40 mg Subcutaneous Q24H   FLUoxetine  60 mg Oral Daily   gabapentin  800 mg Oral TID   levothyroxine  25 mcg Oral Q0600   pantoprazole  40 mg Oral Daily   QUEtiapine  300 mg Oral QHS   ramipril  1.25 mg Oral Daily   Continuous Infusions: PRN Meds:.acetaminophen **OR** acetaminophen (TYLENOL) oral liquid 160 mg/5 mL **OR** acetaminophen, senna-docusate  Antibiotics  :    Anti-infectives (From admission, onward)    None         Time Spent in minutes  30   Lala Lund M.D on 02/09/2021 at 11:41 AM  To page go to [email or web address]   [facility] -  Office  [phone]    See all Orders from today for further details    Objective:   Vitals:   02/09/21 0835 02/09/21 0900 02/09/21 1000 02/09/21 1100  BP: 134/80 126/79 131/85 118/86  Pulse: 74 83 80 82  Resp: '18 13 12 '$ (!) 22  Temp: 97.9 F (36.6 C)     TempSrc: Oral     SpO2: 100% 100% 96% 98%  Weight:      Height:        Wt Readings from Last 3 Encounters:  02/08/21 61.2 kg  01/25/21 60.4 kg  10/13/20 58.7 kg    No intake or output data in the 24 hours ending 02/09/21 1141   Physical Exam  Awake Alert, No new F.N deficits, Normal affect [place].AT,PERRAL Supple Neck,No JVD, No cervical lymphadenopathy appriciated.  Symmetrical Chest wall movement, Good air movement bilaterally, CTAB RRR,No Gallops,Rubs or new Murmurs, No Parasternal Heave +ve B.Sounds, Abd Soft, No tenderness, No organomegaly appriciated, No rebound - guarding or rigidity. No Cyanosis, Clubbing or edema, No new Rash or bruise     Data Review:    CBC Recent  Labs  Lab 02/08/21 1331 02/09/21 0334  WBC 3.8* 3.8*  HGB 11.7* 11.1*  HCT 36.7 34.1*  PLT 222 206  MCV 104.0* 102.7*  MCH 33.1 33.4  MCHC 31.9 32.6  RDW 14.0 13.9  LYMPHSABS 1.3  --   MONOABS 0.2  --   EOSABS 0.1  --   BASOSABS 0.0  --     Recent Labs  Lab 02/08/21 1331 02/09/21 0334  NA 139 136  K 4.2 3.7  CL 108 103  CO2 26 26  GLUCOSE 102* 101*  BUN 22 20  CREATININE 1.01* 1.01*  CALCIUM 8.9 8.8*  AST 43* 30  ALT 60* 45*  ALKPHOS 62 49  BILITOT 0.4 0.7  ALBUMIN 3.4* 2.9*  HGBA1C  --  6.1*    ------------------------------------------------------------------------------------------------------------------ Recent Labs    02/09/21 0334  CHOL 145  HDL 53  LDLCALC 78  TRIG 72  CHOLHDL 2.7    Lab Results  Component Value Date   HGBA1C 6.1 (H) 02/09/2021    ------------------------------------------------------------------------------------------------------------------ No results for input(s): TSH, T4TOTAL, T3FREE, THYROIDAB in the last 72 hours.  Invalid input(s): FREET3  Cardiac Enzymes No results for input(s): CKMB, TROPONINI, MYOGLOBIN in the last 168 hours.  Invalid input(s): CK ------------------------------------------------------------------------------------------------------------------ No results found for: BNP   Radiology Reports CT ANGIO HEAD W OR WO CONTRAST  Result Date: 02/09/2021 CLINICAL DATA:  Stroke EXAM: CT ANGIOGRAPHY HEAD AND NECK TECHNIQUE: Multidetector CT imaging of the head and neck was performed using the standard protocol during bolus administration of intravenous contrast. Multiplanar CT image reconstructions and MIPs were obtained to evaluate the vascular anatomy. Carotid stenosis measurements (when applicable) are obtained utilizing NASCET criteria, using the distal internal carotid diameter as the denominator. CONTRAST:  85m OMNIPAQUE IOHEXOL 350 MG/ML SOLN COMPARISON:  None. FINDINGS: CTA NECK FINDINGS SKELETON: There is no bony spinal canal stenosis. No lytic or blastic lesion. OTHER NECK: Normal pharynx, larynx and major salivary glands. No cervical lymphadenopathy. Unremarkable thyroid gland. UPPER CHEST: No pneumothorax or pleural effusion. No nodules or masses. AORTIC ARCH: There is no calcific atherosclerosis of the aortic arch. There is no aneurysm, dissection or hemodynamically significant stenosis of the visualized portion of the aorta. Conventional 3 vessel aortic branching pattern. The visualized proximal subclavian arteries are widely patent. RIGHT CAROTID SYSTEM: Normal without aneurysm, dissection or stenosis. LEFT CAROTID SYSTEM: Normal without aneurysm, dissection or stenosis. VERTEBRAL ARTERIES: Left dominant configuration. Both origins are clearly patent. There is no dissection, occlusion or  flow-limiting stenosis to the skull base (V1-V3 segments). CTA HEAD FINDINGS POSTERIOR CIRCULATION: --Vertebral arteries: Normal V4 segments. --Inferior cerebellar arteries: Normal. --Basilar artery: Normal. --Superior cerebellar arteries: Normal. --Posterior cerebral arteries (PCA): Normal. ANTERIOR CIRCULATION: --Intracranial internal carotid arteries: Normal. --Anterior cerebral arteries (ACA): Normal. Both A1 segments are present. Patent anterior communicating artery (a-comm). --Middle cerebral arteries (MCA): Normal. VENOUS SINUSES: As permitted by contrast timing, patent. ANATOMIC VARIANTS: None Review of the MIP images confirms the above findings. IMPRESSION: Normal CTA of the head and neck. Electronically Signed   By: KUlyses JarredM.D.   On: 02/09/2021 03:35   CT HEAD WO CONTRAST (5MM)  Result Date: 02/08/2021 CLINICAL DATA:  Numbness and tingling, paresthesias, altered mental status EXAM: CT HEAD WITHOUT CONTRAST CT CERVICAL SPINE WITHOUT CONTRAST TECHNIQUE: Multidetector CT imaging of the head and cervical spine was performed following the standard protocol without intravenous contrast. Multiplanar CT image reconstructions of the cervical spine were also generated. COMPARISON:  10/11/2020 FINDINGS: CT HEAD FINDINGS Brain: No  acute infarct or hemorrhage. Lateral ventricles and midline structures are unremarkable. No acute extra-axial fluid collections. No mass effect. Vascular: No hyperdense vessel or unexpected calcification. Skull: Normal. Negative for fracture or focal lesion. Sinuses/Orbits: No acute finding. Other: None. CT CERVICAL SPINE FINDINGS Alignment: Mild anterolisthesis of C4 on C5 likely due to facet hypertrophy. Otherwise alignment is anatomic. Skull base and vertebrae: No acute fracture. No primary bone lesion or focal pathologic process. Soft tissues and spinal canal: Numerous borderline enlarged lymph nodes are seen throughout the neck compatible with patient's known history of  B-cell lymphoma. Index lymph nodes are as follows: Right level 2, image 43/6, 8 mm.  Stable. Right level 2, image 43/6, 6 mm, previously 7 mm. Left level 2, image 44/6, 8 mm.  Stable. Left supraclavicular mass, image 84/6, 36 x 17 mm.  Stable. Prevertebral and retropharyngeal soft tissues are unremarkable. Disc levels: There is prominent spondylosis at C5-6 and C6-7, with mild symmetrical neural foraminal encroachment most pronounced at C5-6. Mild diffuse facet hypertrophy greatest at C3-4 and C4-5. Upper chest: Airway is patent. Limited imaging through the lung apices are clear. Other: Reconstructed images demonstrate no additional findings. IMPRESSION: 1. No acute cervical spine fracture. 2. Mild anterolisthesis of C4 on C5, likely due to facet hypertrophy. 3. Prominent spondylosis at C5-6 and C6-7 with minimal neural foraminal encroachment. 4. Diffuse lymphadenopathy throughout the neck, consistent with B-cell lymphoma. Grossly stable since prior CT 10/11/2020. Electronically Signed   By: Randa Ngo M.D.   On: 02/08/2021 15:35   CT ANGIO NECK W OR WO CONTRAST  Result Date: 02/09/2021 CLINICAL DATA:  Stroke EXAM: CT ANGIOGRAPHY HEAD AND NECK TECHNIQUE: Multidetector CT imaging of the head and neck was performed using the standard protocol during bolus administration of intravenous contrast. Multiplanar CT image reconstructions and MIPs were obtained to evaluate the vascular anatomy. Carotid stenosis measurements (when applicable) are obtained utilizing NASCET criteria, using the distal internal carotid diameter as the denominator. CONTRAST:  85m OMNIPAQUE IOHEXOL 350 MG/ML SOLN COMPARISON:  None. FINDINGS: CTA NECK FINDINGS SKELETON: There is no bony spinal canal stenosis. No lytic or blastic lesion. OTHER NECK: Normal pharynx, larynx and major salivary glands. No cervical lymphadenopathy. Unremarkable thyroid gland. UPPER CHEST: No pneumothorax or pleural effusion. No nodules or masses. AORTIC ARCH:  There is no calcific atherosclerosis of the aortic arch. There is no aneurysm, dissection or hemodynamically significant stenosis of the visualized portion of the aorta. Conventional 3 vessel aortic branching pattern. The visualized proximal subclavian arteries are widely patent. RIGHT CAROTID SYSTEM: Normal without aneurysm, dissection or stenosis. LEFT CAROTID SYSTEM: Normal without aneurysm, dissection or stenosis. VERTEBRAL ARTERIES: Left dominant configuration. Both origins are clearly patent. There is no dissection, occlusion or flow-limiting stenosis to the skull base (V1-V3 segments). CTA HEAD FINDINGS POSTERIOR CIRCULATION: --Vertebral arteries: Normal V4 segments. --Inferior cerebellar arteries: Normal. --Basilar artery: Normal. --Superior cerebellar arteries: Normal. --Posterior cerebral arteries (PCA): Normal. ANTERIOR CIRCULATION: --Intracranial internal carotid arteries: Normal. --Anterior cerebral arteries (ACA): Normal. Both A1 segments are present. Patent anterior communicating artery (a-comm). --Middle cerebral arteries (MCA): Normal. VENOUS SINUSES: As permitted by contrast timing, patent. ANATOMIC VARIANTS: None Review of the MIP images confirms the above findings. IMPRESSION: Normal CTA of the head and neck. Electronically Signed   By: KUlyses JarredM.D.   On: 02/09/2021 03:35   CT Cervical Spine Wo Contrast  Result Date: 02/08/2021 CLINICAL DATA:  Numbness and tingling, paresthesias, altered mental status EXAM: CT HEAD WITHOUT CONTRAST CT CERVICAL SPINE WITHOUT  CONTRAST TECHNIQUE: Multidetector CT imaging of the head and cervical spine was performed following the standard protocol without intravenous contrast. Multiplanar CT image reconstructions of the cervical spine were also generated. COMPARISON:  10/11/2020 FINDINGS: CT HEAD FINDINGS Brain: No acute infarct or hemorrhage. Lateral ventricles and midline structures are unremarkable. No acute extra-axial fluid collections. No mass effect.  Vascular: No hyperdense vessel or unexpected calcification. Skull: Normal. Negative for fracture or focal lesion. Sinuses/Orbits: No acute finding. Other: None. CT CERVICAL SPINE FINDINGS Alignment: Mild anterolisthesis of C4 on C5 likely due to facet hypertrophy. Otherwise alignment is anatomic. Skull base and vertebrae: No acute fracture. No primary bone lesion or focal pathologic process. Soft tissues and spinal canal: Numerous borderline enlarged lymph nodes are seen throughout the neck compatible with patient's known history of B-cell lymphoma. Index lymph nodes are as follows: Right level 2, image 43/6, 8 mm.  Stable. Right level 2, image 43/6, 6 mm, previously 7 mm. Left level 2, image 44/6, 8 mm.  Stable. Left supraclavicular mass, image 84/6, 36 x 17 mm.  Stable. Prevertebral and retropharyngeal soft tissues are unremarkable. Disc levels: There is prominent spondylosis at C5-6 and C6-7, with mild symmetrical neural foraminal encroachment most pronounced at C5-6. Mild diffuse facet hypertrophy greatest at C3-4 and C4-5. Upper chest: Airway is patent. Limited imaging through the lung apices are clear. Other: Reconstructed images demonstrate no additional findings. IMPRESSION: 1. No acute cervical spine fracture. 2. Mild anterolisthesis of C4 on C5, likely due to facet hypertrophy. 3. Prominent spondylosis at C5-6 and C6-7 with minimal neural foraminal encroachment. 4. Diffuse lymphadenopathy throughout the neck, consistent with B-cell lymphoma. Grossly stable since prior CT 10/11/2020. Electronically Signed   By: Randa Ngo M.D.   On: 02/08/2021 15:35   MR BRAIN WO CONTRAST  Result Date: 02/08/2021 CLINICAL DATA:  Neurological deficit, acute, stroke suspected. Left-sided numbness. EXAM: MRI HEAD WITHOUT CONTRAST TECHNIQUE: Multiplanar, multiecho pulse sequences of the brain and surrounding structures were obtained without intravenous contrast. COMPARISON:  Head CT same day. FINDINGS: Brain: Diffusion  imaging shows an acute small vessel infarction in the right lateral thalamus posteriorly. No other acute infarction. Elsewhere, there are a few old small vessel infarctions of the deep white matter. No cortical or large vessel territory infarction. No mass lesion, hemorrhage, hydrocephalus or extra-axial collection. Vascular: Major vessels at the base of the brain show flow. Skull and upper cervical spine: Negative Sinuses/Orbits: Clear/normal Other: None IMPRESSION: Acute small vessel infarction of the posterolateral right thalamus. Mild chronic small-vessel ischemic changes elsewhere affecting the cerebral hemispheric white matter. Electronically Signed   By: Nelson Chimes M.D.   On: 02/08/2021 18:13   MR BRAIN W CONTRAST  Result Date: 02/09/2021 CLINICAL DATA:  Metastatic disease evaluation EXAM: MRI HEAD WITH CONTRAST TECHNIQUE: Multiplanar, multiecho pulse sequences of the brain and surrounding structures were obtained with intravenous contrast. CONTRAST:  96m GADAVIST GADOBUTROL 1 MMOL/ML IV SOLN COMPARISON:  MRI 02/08/2021. FINDINGS: Postcontrast imaging only was performed to further evaluate for metastatic disease. No abnormal enhancement. Please see MRI head from yesterday for characterization of other intracranial findings, including right posterolateral thalamic infarct. IMPRESSION: No abnormal enhancement.  No evidence of metastatic disease. Electronically Signed   By: FMargaretha SheffieldM.D.   On: 02/09/2021 08:55

## 2021-02-09 NOTE — ED Notes (Signed)
Echo at bedside

## 2021-02-09 NOTE — ED Notes (Signed)
Pt transported to MRI 

## 2021-02-09 NOTE — ED Notes (Signed)
Food and drink provided to pt.

## 2021-02-10 DIAGNOSIS — Z20822 Contact with and (suspected) exposure to covid-19: Secondary | ICD-10-CM | POA: Diagnosis present

## 2021-02-10 DIAGNOSIS — Z79890 Hormone replacement therapy (postmenopausal): Secondary | ICD-10-CM | POA: Diagnosis not present

## 2021-02-10 DIAGNOSIS — G8929 Other chronic pain: Secondary | ICD-10-CM | POA: Diagnosis present

## 2021-02-10 DIAGNOSIS — Z808 Family history of malignant neoplasm of other organs or systems: Secondary | ICD-10-CM | POA: Diagnosis not present

## 2021-02-10 DIAGNOSIS — E785 Hyperlipidemia, unspecified: Secondary | ICD-10-CM | POA: Diagnosis present

## 2021-02-10 DIAGNOSIS — Z807 Family history of other malignant neoplasms of lymphoid, hematopoietic and related tissues: Secondary | ICD-10-CM | POA: Diagnosis not present

## 2021-02-10 DIAGNOSIS — Z79899 Other long term (current) drug therapy: Secondary | ICD-10-CM | POA: Diagnosis not present

## 2021-02-10 DIAGNOSIS — E039 Hypothyroidism, unspecified: Secondary | ICD-10-CM | POA: Diagnosis present

## 2021-02-10 DIAGNOSIS — D72819 Decreased white blood cell count, unspecified: Secondary | ICD-10-CM | POA: Diagnosis present

## 2021-02-10 DIAGNOSIS — C8302 Small cell B-cell lymphoma, intrathoracic lymph nodes: Secondary | ICD-10-CM | POA: Diagnosis present

## 2021-02-10 DIAGNOSIS — E559 Vitamin D deficiency, unspecified: Secondary | ICD-10-CM | POA: Diagnosis present

## 2021-02-10 DIAGNOSIS — F319 Bipolar disorder, unspecified: Secondary | ICD-10-CM | POA: Diagnosis present

## 2021-02-10 DIAGNOSIS — I159 Secondary hypertension, unspecified: Secondary | ICD-10-CM | POA: Diagnosis present

## 2021-02-10 DIAGNOSIS — I639 Cerebral infarction, unspecified: Secondary | ICD-10-CM | POA: Diagnosis not present

## 2021-02-10 DIAGNOSIS — G4733 Obstructive sleep apnea (adult) (pediatric): Secondary | ICD-10-CM | POA: Diagnosis present

## 2021-02-10 DIAGNOSIS — I6381 Other cerebral infarction due to occlusion or stenosis of small artery: Secondary | ICD-10-CM | POA: Diagnosis present

## 2021-02-10 DIAGNOSIS — Z803 Family history of malignant neoplasm of breast: Secondary | ICD-10-CM | POA: Diagnosis not present

## 2021-02-10 DIAGNOSIS — K219 Gastro-esophageal reflux disease without esophagitis: Secondary | ICD-10-CM | POA: Diagnosis present

## 2021-02-10 MED ORDER — ATORVASTATIN CALCIUM 40 MG PO TABS: 40.0000 mg | ORAL_TABLET | Freq: Every day | ORAL | 0 refills | Status: DC

## 2021-02-10 MED ORDER — ASPIRIN EC 81 MG PO TBEC: 81.0000 mg | DELAYED_RELEASE_TABLET | Freq: Every day | ORAL | 2 refills | Status: DC

## 2021-02-10 MED ORDER — CLOPIDOGREL BISULFATE 75 MG PO TABS: 75.0000 mg | ORAL_TABLET | Freq: Every day | ORAL | 0 refills | Status: DC

## 2021-02-10 MED ORDER — ATORVASTATIN CALCIUM 40 MG PO TABS
40.0000 mg | ORAL_TABLET | Freq: Every day | ORAL | Status: DC
  Administered 2021-02-10 – 2021-02-12 (×3): 40 mg via ORAL
  Filled 2021-02-10 (×3): qty 1

## 2021-02-10 NOTE — Discharge Instructions (Signed)
Follow with Primary MD Ernestene Kiel, MD in 7 days   Get CBC, CMP -  checked next visit within 1 week by Primary MD    Activity: As tolerated with Full fall precautions use walker/cane & assistance as needed  Disposition Home     Diet: Heart Healthy    Special Instructions: If you have smoked or chewed Tobacco  in the last 2 yrs please stop smoking, stop any regular Alcohol  and or any Recreational drug use.  On your next visit with your primary care physician please Get Medicines reviewed and adjusted.  Please request your Prim.MD to go over all Hospital Tests and Procedure/Radiological results at the follow up, please get all Hospital records sent to your Prim MD by signing hospital release before you go home.  If you experience worsening of your admission symptoms, develop shortness of breath, life threatening emergency, suicidal or homicidal thoughts you must seek medical attention immediately by calling 911 or calling your MD immediately  if symptoms less severe.  You Must read complete instructions/literature along with all the possible adverse reactions/side effects for all the Medicines you take and that have been prescribed to you. Take any new Medicines after you have completely understood and accpet all the possible adverse reactions/side effects.

## 2021-02-10 NOTE — Progress Notes (Signed)
PROGRESS NOTE                                                                                                                                                                                                             Patient Demographics:    Dawn Prince, is a 60 y.o. female, DOB - 03-31-1961, [id number]  Outpatient Primary MD for the patient is Ernestene Kiel, MD    LOS - 0  Admit date - 02/08/2021    Chief Complaint  Patient presents with   Numbness       Brief Narrative (HPI from H&P)  - Dawn Prince is a 60 y.o. 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   Subjective:   Patient in bed, appears comfortable, denies any headache, no fever, no chest pain or pressure, no shortness of breath , no abdominal pain. No focal weakness, mild left leg tingling and numbness which has been ongoing now close to 5 to 7 days.   Assessment  & Plan :   L arm Weakness due to CVA likely on 02/05/21 - MRI +ve for  Acute small vessel infarction of the posterolateral right thalamus, CTA head and neck unremarkable- Neuro on board, for now DAPT + Statin, full stroke pathway being followed, her symptoms are better however on echocardiogram there is aortic valve density, case discussed with cardiologist Dr. Johnny Bridge who will conduct TEE on coming Monday.  No change in treatment according to cardiology for now.  Patient denies any history of serious infections of bacteremia, no IV drug use, no history of central line supportive.  Do not think this is infectious in etiology but will check blood cultures and procalcitonin baseline.  TEE on  Monday.  2. Stage IIIb small lymphocytic lymphoma: On active observation.  Follows with medical oncology, Dr. Hinton Rao.  CT cervical spine shows diffuse lymphadenopathy throughout the neck consistent with known B-cell lymphoma, stable compared to prior CT in May 2022.  Mild leukopenia noted, fluctuates up and down with normal B12 and folate levels per recent oncology note.  3. HTN - CVA likely > 71 days old,  BP borderline, DC low dose ACE for now.  4.  Hypothyroidism.  On Synthroid.  5.  Dyslipidemia.  On statin.  6.  Chronic low back pain.  On Neurontin continue.  7.  Mood disorder.  Continue combination of Seroquel, Prozac and Wellbutrin.  Lab Results  Component Value Date   HGBA1C 6.1 (H) 02/09/2021   Lab Results  Component Value Date   CHOL 145 02/09/2021   HDL 53 02/09/2021   LDLCALC 78 02/09/2021   TRIG 72 02/09/2021   CHOLHDL 2.7 02/09/2021         Condition - Fair  Family Communication  :  none present  Code Status :  Full  Consults  :  Neuro, cardiologist Dr. Domenic Polite over the phone  PUD Prophylaxis :  PPI   Procedures  :     MRI brain with and without contrast.  Acute small vessel infarction of the posterolateral right thalamus. Mild chronic small-vessel ischemic changes elsewhere affecting the cerebral hemispheric white matter.  TTE - 1. The aortic valve is abnormal. Aortic valve regurgitation is not visualized. No aortic stenosis is present. There is a 1 cm X 1 cm echodensity see in the PLAX and PSAX view associated with the non-coronary cusp. Best seen in image series 8 and 19.  2. Left ventricular ejection fraction, by estimation, is 60 to 65%. The left ventricle has normal function. The left ventricle has no regional wall motion abnormalities. Left ventricular diastolic parameters are consistent with Grade I diastolic dysfunction (impaired relaxation).  3. Right ventricular systolic function is normal. The right ventricular size is normal.  4. The mitral valve  is grossly normal. No evidence of mitral valve regurgitation. No evidence of mitral stenosis.  5. The inferior vena cava is normal in size with greater than 50% respiratory variability, suggesting right atrial pressure of 3 mmHg. Comparison(s): No prior Echocardiogram. Conclusion(s)/Recommendation(s): Considered secondary imaging for the aortic valve, TEE or Cardiac CT, for further evaluation.   CTA Head and Neck - Non acute      Disposition Plan  :    Status is: Observation  Dispo: The patient is from: Home              Anticipated d/c is to: Home              Patient currently is not medically stable to d/c.   Difficult to place patient No   DVT Prophylaxis  :    enoxaparin (LOVENOX) injection 40 mg Start: 02/08/21 2045   Lab Results  Component Value Date   PLT 206 02/09/2021    Diet :  Diet Order             Diet - low sodium heart healthy           DIET SOFT Room service appropriate? Yes; Fluid consistency: Thin  Diet effective now                    Inpatient Medications  Scheduled Meds:   stroke: mapping our early stages of recovery book   Does not apply Once   aspirin EC  81 mg Oral Daily   atorvastatin  40 mg Oral Daily   buPROPion  75 mg Oral BID   clopidogrel  75 mg Oral Daily   enoxaparin (LOVENOX) injection  40 mg Subcutaneous Q24H   FLUoxetine  60 mg Oral Daily   gabapentin  800 mg Oral TID   levothyroxine  25 mcg Oral Q0600  pantoprazole  40 mg Oral Daily   QUEtiapine  300 mg Oral QHS   Continuous Infusions: PRN Meds:.acetaminophen **OR** [DISCONTINUED] acetaminophen (TYLENOL) oral liquid 160 mg/5 mL **OR** [DISCONTINUED] acetaminophen, senna-docusate  Antibiotics  :    Anti-infectives (From admission, onward)    None        Time Spent in minutes  30   Lala Lund M.D on 02/10/2021 at 9:40 AM  To page go to [email or web address]   [facility] -  Office  [phone]    See all Orders from today for further details     Objective:   Vitals:   02/09/21 1444 02/09/21 2025 02/09/21 2300 02/10/21 0404  BP:  122/73 126/70 101/70  Pulse:  87 92 80  Resp:  '18 20 18  '$ Temp: 97.6 F (36.4 C) 98.4 F (36.9 C) 98.5 F (36.9 C) 97.7 F (36.5 C)  TempSrc: Oral Oral Oral Oral  SpO2:  97% 98% 95%  Weight:      Height:        Wt Readings from Last 3 Encounters:  02/08/21 61.2 kg  01/25/21 60.4 kg  10/13/20 58.7 kg    No intake or output data in the 24 hours ending 02/10/21 0940   Physical Exam  Awake Alert, No new F.N deficits, Normal affect Iron.AT,PERRAL Supple Neck,No JVD, No cervical lymphadenopathy appriciated.  Symmetrical Chest wall movement, Good air movement bilaterally, CTAB RRR,No Gallops, Rubs or new Murmurs, No Parasternal Heave +ve B.Sounds, Abd Soft, No tenderness, No organomegaly appriciated, No rebound - guarding or rigidity. No Cyanosis, Clubbing or edema, No new Rash or bruise    Data Review:    CBC Recent Labs  Lab 02/08/21 1331 02/09/21 0334  WBC 3.8* 3.8*  HGB 11.7* 11.1*  HCT 36.7 34.1*  PLT 222 206  MCV 104.0* 102.7*  MCH 33.1 33.4  MCHC 31.9 32.6  RDW 14.0 13.9  LYMPHSABS 1.3  --   MONOABS 0.2  --   EOSABS 0.1  --   BASOSABS 0.0  --     Recent Labs  Lab 02/08/21 1331 02/09/21 0334  NA 139 136  K 4.2 3.7  CL 108 103  CO2 26 26  GLUCOSE 102* 101*  BUN 22 20  CREATININE 1.01* 1.01*  CALCIUM 8.9 8.8*  AST 43* 30  ALT 60* 45*  ALKPHOS 62 49  BILITOT 0.4 0.7  ALBUMIN 3.4* 2.9*  HGBA1C  --  6.1*    ------------------------------------------------------------------------------------------------------------------ Recent Labs    02/09/21 0334  CHOL 145  HDL 53  LDLCALC 78  TRIG 72  CHOLHDL 2.7    Lab Results  Component Value Date   HGBA1C 6.1 (H) 02/09/2021   ------------------------------------------------------------------------------------------------------------------ No results for input(s): TSH, T4TOTAL, T3FREE, THYROIDAB in the last  72 hours.  Invalid input(s): FREET3  Cardiac Enzymes No results for input(s): CKMB, TROPONINI, MYOGLOBIN in the last 168 hours.  Invalid input(s): CK ------------------------------------------------------------------------------------------------------------------ No results found for: BNP   Radiology Reports CT ANGIO HEAD W OR WO CONTRAST  Result Date: 02/09/2021 CLINICAL DATA:  Stroke EXAM: CT ANGIOGRAPHY HEAD AND NECK TECHNIQUE: Multidetector CT imaging of the head and neck was performed using the standard protocol during bolus administration of intravenous contrast. Multiplanar CT image reconstructions and MIPs were obtained to evaluate the vascular anatomy. Carotid stenosis measurements (when applicable) are obtained utilizing NASCET criteria, using the distal internal carotid diameter as the denominator. CONTRAST:  30m OMNIPAQUE IOHEXOL 350 MG/ML SOLN COMPARISON:  None. FINDINGS: CTA NECK FINDINGS SKELETON: There  is no bony spinal canal stenosis. No lytic or blastic lesion. OTHER NECK: Normal pharynx, larynx and major salivary glands. No cervical lymphadenopathy. Unremarkable thyroid gland. UPPER CHEST: No pneumothorax or pleural effusion. No nodules or masses. AORTIC ARCH: There is no calcific atherosclerosis of the aortic arch. There is no aneurysm, dissection or hemodynamically significant stenosis of the visualized portion of the aorta. Conventional 3 vessel aortic branching pattern. The visualized proximal subclavian arteries are widely patent. RIGHT CAROTID SYSTEM: Normal without aneurysm, dissection or stenosis. LEFT CAROTID SYSTEM: Normal without aneurysm, dissection or stenosis. VERTEBRAL ARTERIES: Left dominant configuration. Both origins are clearly patent. There is no dissection, occlusion or flow-limiting stenosis to the skull base (V1-V3 segments). CTA HEAD FINDINGS POSTERIOR CIRCULATION: --Vertebral arteries: Normal V4 segments. --Inferior cerebellar arteries: Normal. --Basilar  artery: Normal. --Superior cerebellar arteries: Normal. --Posterior cerebral arteries (PCA): Normal. ANTERIOR CIRCULATION: --Intracranial internal carotid arteries: Normal. --Anterior cerebral arteries (ACA): Normal. Both A1 segments are present. Patent anterior communicating artery (a-comm). --Middle cerebral arteries (MCA): Normal. VENOUS SINUSES: As permitted by contrast timing, patent. ANATOMIC VARIANTS: None Review of the MIP images confirms the above findings. IMPRESSION: Normal CTA of the head and neck. Electronically Signed   By: Ulyses Jarred M.D.   On: 02/09/2021 03:35   CT HEAD WO CONTRAST (5MM)  Result Date: 02/08/2021 CLINICAL DATA:  Numbness and tingling, paresthesias, altered mental status EXAM: CT HEAD WITHOUT CONTRAST CT CERVICAL SPINE WITHOUT CONTRAST TECHNIQUE: Multidetector CT imaging of the head and cervical spine was performed following the standard protocol without intravenous contrast. Multiplanar CT image reconstructions of the cervical spine were also generated. COMPARISON:  10/11/2020 FINDINGS: CT HEAD FINDINGS Brain: No acute infarct or hemorrhage. Lateral ventricles and midline structures are unremarkable. No acute extra-axial fluid collections. No mass effect. Vascular: No hyperdense vessel or unexpected calcification. Skull: Normal. Negative for fracture or focal lesion. Sinuses/Orbits: No acute finding. Other: None. CT CERVICAL SPINE FINDINGS Alignment: Mild anterolisthesis of C4 on C5 likely due to facet hypertrophy. Otherwise alignment is anatomic. Skull base and vertebrae: No acute fracture. No primary bone lesion or focal pathologic process. Soft tissues and spinal canal: Numerous borderline enlarged lymph nodes are seen throughout the neck compatible with patient's known history of B-cell lymphoma. Index lymph nodes are as follows: Right level 2, image 43/6, 8 mm.  Stable. Right level 2, image 43/6, 6 mm, previously 7 mm. Left level 2, image 44/6, 8 mm.  Stable. Left  supraclavicular mass, image 84/6, 36 x 17 mm.  Stable. Prevertebral and retropharyngeal soft tissues are unremarkable. Disc levels: There is prominent spondylosis at C5-6 and C6-7, with mild symmetrical neural foraminal encroachment most pronounced at C5-6. Mild diffuse facet hypertrophy greatest at C3-4 and C4-5. Upper chest: Airway is patent. Limited imaging through the lung apices are clear. Other: Reconstructed images demonstrate no additional findings. IMPRESSION: 1. No acute cervical spine fracture. 2. Mild anterolisthesis of C4 on C5, likely due to facet hypertrophy. 3. Prominent spondylosis at C5-6 and C6-7 with minimal neural foraminal encroachment. 4. Diffuse lymphadenopathy throughout the neck, consistent with B-cell lymphoma. Grossly stable since prior CT 10/11/2020. Electronically Signed   By: Randa Ngo M.D.   On: 02/08/2021 15:35   CT ANGIO NECK W OR WO CONTRAST  Result Date: 02/09/2021 CLINICAL DATA:  Stroke EXAM: CT ANGIOGRAPHY HEAD AND NECK TECHNIQUE: Multidetector CT imaging of the head and neck was performed using the standard protocol during bolus administration of intravenous contrast. Multiplanar CT image reconstructions and MIPs were obtained to  evaluate the vascular anatomy. Carotid stenosis measurements (when applicable) are obtained utilizing NASCET criteria, using the distal internal carotid diameter as the denominator. CONTRAST:  44m OMNIPAQUE IOHEXOL 350 MG/ML SOLN COMPARISON:  None. FINDINGS: CTA NECK FINDINGS SKELETON: There is no bony spinal canal stenosis. No lytic or blastic lesion. OTHER NECK: Normal pharynx, larynx and major salivary glands. No cervical lymphadenopathy. Unremarkable thyroid gland. UPPER CHEST: No pneumothorax or pleural effusion. No nodules or masses. AORTIC ARCH: There is no calcific atherosclerosis of the aortic arch. There is no aneurysm, dissection or hemodynamically significant stenosis of the visualized portion of the aorta. Conventional 3 vessel  aortic branching pattern. The visualized proximal subclavian arteries are widely patent. RIGHT CAROTID SYSTEM: Normal without aneurysm, dissection or stenosis. LEFT CAROTID SYSTEM: Normal without aneurysm, dissection or stenosis. VERTEBRAL ARTERIES: Left dominant configuration. Both origins are clearly patent. There is no dissection, occlusion or flow-limiting stenosis to the skull base (V1-V3 segments). CTA HEAD FINDINGS POSTERIOR CIRCULATION: --Vertebral arteries: Normal V4 segments. --Inferior cerebellar arteries: Normal. --Basilar artery: Normal. --Superior cerebellar arteries: Normal. --Posterior cerebral arteries (PCA): Normal. ANTERIOR CIRCULATION: --Intracranial internal carotid arteries: Normal. --Anterior cerebral arteries (ACA): Normal. Both A1 segments are present. Patent anterior communicating artery (a-comm). --Middle cerebral arteries (MCA): Normal. VENOUS SINUSES: As permitted by contrast timing, patent. ANATOMIC VARIANTS: None Review of the MIP images confirms the above findings. IMPRESSION: Normal CTA of the head and neck. Electronically Signed   By: KUlyses JarredM.D.   On: 02/09/2021 03:35   CT Cervical Spine Wo Contrast  Result Date: 02/08/2021 CLINICAL DATA:  Numbness and tingling, paresthesias, altered mental status EXAM: CT HEAD WITHOUT CONTRAST CT CERVICAL SPINE WITHOUT CONTRAST TECHNIQUE: Multidetector CT imaging of the head and cervical spine was performed following the standard protocol without intravenous contrast. Multiplanar CT image reconstructions of the cervical spine were also generated. COMPARISON:  10/11/2020 FINDINGS: CT HEAD FINDINGS Brain: No acute infarct or hemorrhage. Lateral ventricles and midline structures are unremarkable. No acute extra-axial fluid collections. No mass effect. Vascular: No hyperdense vessel or unexpected calcification. Skull: Normal. Negative for fracture or focal lesion. Sinuses/Orbits: No acute finding. Other: None. CT CERVICAL SPINE FINDINGS  Alignment: Mild anterolisthesis of C4 on C5 likely due to facet hypertrophy. Otherwise alignment is anatomic. Skull base and vertebrae: No acute fracture. No primary bone lesion or focal pathologic process. Soft tissues and spinal canal: Numerous borderline enlarged lymph nodes are seen throughout the neck compatible with patient's known history of B-cell lymphoma. Index lymph nodes are as follows: Right level 2, image 43/6, 8 mm.  Stable. Right level 2, image 43/6, 6 mm, previously 7 mm. Left level 2, image 44/6, 8 mm.  Stable. Left supraclavicular mass, image 84/6, 36 x 17 mm.  Stable. Prevertebral and retropharyngeal soft tissues are unremarkable. Disc levels: There is prominent spondylosis at C5-6 and C6-7, with mild symmetrical neural foraminal encroachment most pronounced at C5-6. Mild diffuse facet hypertrophy greatest at C3-4 and C4-5. Upper chest: Airway is patent. Limited imaging through the lung apices are clear. Other: Reconstructed images demonstrate no additional findings. IMPRESSION: 1. No acute cervical spine fracture. 2. Mild anterolisthesis of C4 on C5, likely due to facet hypertrophy. 3. Prominent spondylosis at C5-6 and C6-7 with minimal neural foraminal encroachment. 4. Diffuse lymphadenopathy throughout the neck, consistent with B-cell lymphoma. Grossly stable since prior CT 10/11/2020. Electronically Signed   By: MRanda NgoM.D.   On: 02/08/2021 15:35   MR BRAIN WO CONTRAST  Result Date: 02/08/2021 CLINICAL DATA:  Neurological deficit, acute, stroke suspected. Left-sided numbness. EXAM: MRI HEAD WITHOUT CONTRAST TECHNIQUE: Multiplanar, multiecho pulse sequences of the brain and surrounding structures were obtained without intravenous contrast. COMPARISON:  Head CT same day. FINDINGS: Brain: Diffusion imaging shows an acute small vessel infarction in the right lateral thalamus posteriorly. No other acute infarction. Elsewhere, there are a few old small vessel infarctions of the deep  white matter. No cortical or large vessel territory infarction. No mass lesion, hemorrhage, hydrocephalus or extra-axial collection. Vascular: Major vessels at the base of the brain show flow. Skull and upper cervical spine: Negative Sinuses/Orbits: Clear/normal Other: None IMPRESSION: Acute small vessel infarction of the posterolateral right thalamus. Mild chronic small-vessel ischemic changes elsewhere affecting the cerebral hemispheric white matter. Electronically Signed   By: Nelson Chimes M.D.   On: 02/08/2021 18:13   MR BRAIN W CONTRAST  Result Date: 02/09/2021 CLINICAL DATA:  Metastatic disease evaluation EXAM: MRI HEAD WITH CONTRAST TECHNIQUE: Multiplanar, multiecho pulse sequences of the brain and surrounding structures were obtained with intravenous contrast. CONTRAST:  44m GADAVIST GADOBUTROL 1 MMOL/ML IV SOLN COMPARISON:  MRI 02/08/2021. FINDINGS: Postcontrast imaging only was performed to further evaluate for metastatic disease. No abnormal enhancement. Please see MRI head from yesterday for characterization of other intracranial findings, including right posterolateral thalamic infarct. IMPRESSION: No abnormal enhancement.  No evidence of metastatic disease. Electronically Signed   By: FMargaretha SheffieldM.D.   On: 02/09/2021 08:55   ECHOCARDIOGRAM COMPLETE  Result Date: 02/09/2021    ECHOCARDIOGRAM REPORT   Patient Name:   MTEMISHA SABINDAWALT Date of Exam: 02/09/2021 Medical Rec #:  [id number]              Height:       61.0 in Accession #:    [id number]             Weight:       135.0 lb Date of Birth:  1Apr 05, 1962              BSA:          1.598 m Patient Age:    89years                BP:           125/71 mmHg Patient Gender: F                       HR:           85 bpm. Exam Location:  Inpatient Procedure: 2D Echo, Cardiac Doppler, Color Doppler, 3D Echo and Strain Analysis Indications:    Stroke I63.9  History:        Patient has no prior history of Echocardiogram examinations.                  Stroke; Risk Factors:Hypertension and Dyslipidemia. Thyroid                 disease.  Sonographer:    TDarlina SicilianRDCS Referring Phys: [id number] 1. The aortic valve is abnormal. Aortic valve regurgitation is not visualized. No aortic stenosis is present. There is a 1 cm X 1 cm echodensity see in the PLAX and PSAX view associated with the non-coronary cusp. Best seen in image series 8 and 19.  2. Left ventricular ejection fraction, by estimation, is 60 to 65%. The left ventricle has normal function. The left ventricle has no regional wall motion  abnormalities. Left ventricular diastolic parameters are consistent with Grade I diastolic dysfunction (impaired relaxation).  3. Right ventricular systolic function is normal. The right ventricular size is normal.  4. The mitral valve is grossly normal. No evidence of mitral valve regurgitation. No evidence of mitral stenosis.  5. The inferior vena cava is normal in size with greater than 50% respiratory variability, suggesting right atrial pressure of 3 mmHg. Comparison(s): No prior Echocardiogram. Conclusion(s)/Recommendation(s): Considered secondary imaging for the aortic valve, TEE or Cardiac CT, for further evaluation. FINDINGS  Left Ventricle: Left ventricular ejection fraction, by estimation, is 60 to 65%. The left ventricle has normal function. The left ventricle has no regional wall motion abnormalities. Global longitudinal strain performed but not reported based on interpreter judgement due to suboptimal tracking. The left ventricular internal cavity size was small. There is no left ventricular hypertrophy. Left ventricular diastolic parameters are consistent with Grade I diastolic dysfunction (impaired relaxation). Right Ventricle: The right ventricular size is normal. No increase in right ventricular wall thickness. Right ventricular systolic function is normal. Left Atrium: Left atrial size was normal in size. Right Atrium:  Right atrial size was normal in size. Pericardium: There is no evidence of pericardial effusion. Mitral Valve: The mitral valve is grossly normal. No evidence of mitral valve regurgitation. No evidence of mitral valve stenosis. Tricuspid Valve: The tricuspid valve is normal in structure. Tricuspid valve regurgitation is not demonstrated. No evidence of tricuspid stenosis. Aortic Valve: The aortic valve is abnormal. There is moderate aortic valve annular calcification. Aortic valve regurgitation is not visualized. No aortic stenosis is present. Pulmonic Valve: The pulmonic valve was normal in structure. Pulmonic valve regurgitation is not visualized. No evidence of pulmonic stenosis. Aorta: The aortic root is normal in size and structure. Venous: The inferior vena cava is normal in size with greater than 50% respiratory variability, suggesting right atrial pressure of 3 mmHg. IAS/Shunts: The atrial septum is grossly normal.  LEFT VENTRICLE PLAX 2D LVIDd:         3.70 cm  Diastology LVIDs:         2.40 cm  LV e' medial:    6.93 cm/s LV PW:         0.80 cm  LV E/e' medial:  11.8 LV IVS:        1.00 cm  LV e' lateral:   6.62 cm/s LVOT diam:     1.70 cm  LV E/e' lateral: 12.4 LV SV:         39 LV SV Index:   25 LVOT Area:     2.27 cm  RIGHT VENTRICLE TAPSE (M-mode): 2.2 cm LEFT ATRIUM             Index       RIGHT ATRIUM          Index LA diam:        2.70 cm 1.69 cm/m  RA Area:     8.41 cm LA Vol (A2C):   13.9 ml 8.70 ml/m  RA Volume:   15.40 ml 9.64 ml/m LA Vol (A4C):   19.1 ml 11.95 ml/m LA Biplane Vol: 17.3 ml 10.82 ml/m  AORTIC VALVE LVOT Vmax:   117.00 cm/s LVOT Vmean:  75.900 cm/s LVOT VTI:    0.174 m  AORTA Ao Root diam: 2.80 cm MITRAL VALVE MV Area (PHT): 5.09 cm    SHUNTS MV Decel Time: 149 msec    Systemic VTI:  0.17 m MV E velocity: 82.00 cm/s  Systemic  Diam: 1.70 cm MV A velocity: 82.23 cm/s MV E/A ratio:  1.00 Rudean Haskell MD Electronically signed by Rudean Haskell MD Signature  Date/Time: 02/09/2021/4:02:09 PM    Final

## 2021-02-10 NOTE — Plan of Care (Signed)

## 2021-02-11 LAB — CBC WITH DIFFERENTIAL/PLATELET
Abs Immature Granulocytes: 0.02 K/uL (ref 0.00–0.07)
Basophils Absolute: 0 K/uL (ref 0.0–0.1)
Basophils Relative: 1 %
Eosinophils Absolute: 0.1 K/uL (ref 0.0–0.5)
Eosinophils Relative: 3 %
HCT: 33.8 % — ABNORMAL LOW (ref 36.0–46.0)
Hemoglobin: 10.9 g/dL — ABNORMAL LOW (ref 12.0–15.0)
Immature Granulocytes: 1 %
Lymphocytes Relative: 38 %
Lymphs Abs: 1.5 K/uL (ref 0.7–4.0)
MCH: 32.4 pg (ref 26.0–34.0)
MCHC: 32.2 g/dL (ref 30.0–36.0)
MCV: 100.6 fL — ABNORMAL HIGH (ref 80.0–100.0)
Monocytes Absolute: 0.2 K/uL (ref 0.1–1.0)
Monocytes Relative: 5 %
Neutro Abs: 2.2 K/uL (ref 1.7–7.7)
Neutrophils Relative %: 52 %
Platelets: 197 K/uL (ref 150–400)
RBC: 3.36 MIL/uL — ABNORMAL LOW (ref 3.87–5.11)
RDW: 13.8 % (ref 11.5–15.5)
WBC: 4 K/uL (ref 4.0–10.5)
nRBC: 0 % (ref 0.0–0.2)

## 2021-02-11 LAB — COMPREHENSIVE METABOLIC PANEL WITH GFR
ALT: 32 U/L (ref 0–44)
AST: 23 U/L (ref 15–41)
Albumin: 2.9 g/dL — ABNORMAL LOW (ref 3.5–5.0)
Alkaline Phosphatase: 44 U/L (ref 38–126)
Anion gap: 7 (ref 5–15)
BUN: 16 mg/dL (ref 8–23)
CO2: 26 mmol/L (ref 22–32)
Calcium: 8.7 mg/dL — ABNORMAL LOW (ref 8.9–10.3)
Chloride: 105 mmol/L (ref 98–111)
Creatinine, Ser: 1.02 mg/dL — ABNORMAL HIGH (ref 0.44–1.00)
GFR, Estimated: >60 mL/min
Glucose, Bld: 123 mg/dL — ABNORMAL HIGH (ref 70–99)
Potassium: 3.8 mmol/L (ref 3.5–5.1)
Sodium: 138 mmol/L (ref 135–145)
Total Bilirubin: 0.6 mg/dL (ref 0.3–1.2)
Total Protein: 5.8 g/dL — ABNORMAL LOW (ref 6.5–8.1)

## 2021-02-11 LAB — PROCALCITONIN: Procalcitonin: <0.1 ng/mL

## 2021-02-11 LAB — MAGNESIUM: Magnesium: 1.9 mg/dL (ref 1.7–2.4)

## 2021-02-11 NOTE — Progress Notes (Signed)
PROGRESS NOTE                                                                                                                                                                                                             Patient Demographics:    Dawn Prince, is a 60 y.o. female, DOB - 07-14-1960, [id number]  Outpatient Primary MD for the patient is Ernestene Kiel, MD    LOS - 1  Admit date - 02/08/2021    Chief Complaint  Patient presents with   Numbness       Brief Narrative (HPI from H&P)  - Dawn Prince is a 60 y.o. 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   Subjective:   Patient in bed, appears comfortable, denies any headache, no fever, no chest pain or pressure, no shortness of breath , no abdominal pain. No new focal weakness, mild left leg tingling and numbness which has been ongoing now close to 5 to 7 days.   Assessment  & Plan :   L arm Weakness due to CVA likely on 02/05/21 - MRI +ve for  Acute small vessel infarction of the posterolateral right thalamus, CTA head and neck unremarkable- Neuro on board, for now DAPT + Statin, full stroke pathway being followed, her symptoms are better however on echocardiogram there is aortic valve density, case discussed with cardiologist Dr. Johnny Bridge who will conduct TEE on coming Monday.  No change in treatment according to cardiology for now.  Patient denies any history of serious infections of bacteremia, no IV drug use, no history of central line supportive.  Do not think this is infectious in etiology but will check blood cultures and procalcitonin baseline.  TEE on  Monday.  2. Stage IIIb small lymphocytic lymphoma: On active observation.  Follows with medical oncology, Dr. Hinton Rao.  CT cervical spine shows diffuse lymphadenopathy throughout the neck consistent with known B-cell lymphoma, stable compared to prior CT in May 2022.  Mild leukopenia noted, fluctuates up and down with normal B12 and folate levels per recent oncology note.  3. HTN - CVA likely > 4 days  old, BP borderline, DC low dose ACE for now.  4.  Hypothyroidism.  On Synthroid.  5.  Dyslipidemia.  On statin.  6.  Chronic low back pain.  On Neurontin continue.  7.  Mood disorder.  Continue combination of Seroquel, Prozac and Wellbutrin.  Lab Results  Component Value Date   HGBA1C 6.1 (H) 02/09/2021   Lab Results  Component Value Date   CHOL 145 02/09/2021   HDL 53 02/09/2021   LDLCALC 78 02/09/2021   TRIG 72 02/09/2021   CHOLHDL 2.7 02/09/2021         Condition - Fair  Family Communication  :  husband bedside 02/10/21, 02/11/21  Code Status :  Full  Consults  :  Neuro, cardiologist Dr. Domenic Polite over the phone  PUD Prophylaxis :  PPI   Procedures  :     MRI brain with and without contrast.  Acute small vessel infarction of the posterolateral right thalamus. Mild chronic small-vessel ischemic changes elsewhere affecting the cerebral hemispheric white matter.  TTE - 1. The aortic valve is abnormal. Aortic valve regurgitation is not visualized. No aortic stenosis is present. There is a 1 cm X 1 cm echodensity see in the PLAX and PSAX view associated with the non-coronary cusp. Best seen in image series 8 and 19.  2. Left ventricular ejection fraction, by estimation, is 60 to 65%. The left ventricle has normal function. The left ventricle has no regional wall motion abnormalities. Left ventricular diastolic parameters are consistent with Grade I diastolic dysfunction (impaired relaxation).  3. Right ventricular systolic function is normal. The right ventricular size is normal.   4. The mitral valve is grossly normal. No evidence of mitral valve regurgitation. No evidence of mitral stenosis.  5. The inferior vena cava is normal in size with greater than 50% respiratory variability, suggesting right atrial pressure of 3 mmHg. Comparison(s): No prior Echocardiogram. Conclusion(s)/Recommendation(s): Considered secondary imaging for the aortic valve, TEE or Cardiac CT, for further evaluation.   CTA Head and Neck - Non acute      Disposition Plan  :    Status is: Observation  Dispo: The patient is from: Home              Anticipated d/c is to: Home              Patient currently is not medically stable to d/c.   Difficult to place patient No   DVT Prophylaxis  :    enoxaparin (LOVENOX) injection 40 mg Start: 02/08/21 2045   Lab Results  Component Value Date   PLT 197 02/11/2021    Diet :  Diet Order             Diet Heart Room service appropriate? Yes; Fluid consistency: Thin  Diet effective now           Diet - low sodium heart healthy                    Inpatient Medications  Scheduled Meds:   stroke: mapping our early stages of recovery book   Does not apply Once   aspirin EC  81 mg Oral Daily   atorvastatin  40 mg Oral Daily   buPROPion  75 mg Oral BID   clopidogrel  75 mg Oral Daily   enoxaparin (LOVENOX) injection  40 mg Subcutaneous Q24H   FLUoxetine  60 mg Oral Daily   gabapentin  800 mg Oral TID   levothyroxine  25  mcg Oral Q0600   pantoprazole  40 mg Oral Daily   QUEtiapine  300 mg Oral QHS   Continuous Infusions: PRN Meds:.acetaminophen **OR** [DISCONTINUED] acetaminophen (TYLENOL) oral liquid 160 mg/5 mL **OR** [DISCONTINUED] acetaminophen, senna-docusate  Antibiotics  :    Anti-infectives (From admission, onward)    None        Time Spent in minutes  30   Lala Lund M.D on 02/11/2021 at 12:03 PM  To page go to [email or web address]   [facility] -  Office  [phone]    See all Orders from today for  further details    Objective:   Vitals:   02/10/21 2052 02/11/21 0046 02/11/21 0406 02/11/21 0727  BP: 130/85 102/68 123/79 131/82  Pulse: 72 72 85 84  Resp: '18 18 18   '$ Temp: 98.6 F (37 C) 97.9 F (36.6 C) 98.7 F (37.1 C) 98.6 F (37 C)  TempSrc:    Oral  SpO2: 95% 95% 97% 98%  Weight:      Height:        Wt Readings from Last 3 Encounters:  02/08/21 61.2 kg  01/25/21 60.4 kg  10/13/20 58.7 kg     Intake/Output Summary (Last 24 hours) at 02/11/2021 1203 Last data filed at 02/11/2021 0742 Gross per 24 hour  Intake 350 ml  Output --  Net 350 ml     Physical Exam  Awake Alert, No new F.N deficits, Normal affect [place].AT,PERRAL Supple Neck,No JVD, No cervical lymphadenopathy appriciated.  Symmetrical Chest wall movement, Good air movement bilaterally, CTAB RRR,No Gallops, Rubs or new Murmurs, No Parasternal Heave +ve B.Sounds, Abd Soft, No tenderness, No organomegaly appriciated, No rebound - guarding or rigidity. No Cyanosis, Clubbing or edema, No new Rash or bruise     Data Review:    CBC Recent Labs  Lab 02/08/21 1331 02/09/21 0334 02/11/21 0220  WBC 3.8* 3.8* 4.0  HGB 11.7* 11.1* 10.9*  HCT 36.7 34.1* 33.8*  PLT 222 206 197  MCV 104.0* 102.7* 100.6*  MCH 33.1 33.4 32.4  MCHC 31.9 32.6 32.2  RDW 14.0 13.9 13.8  LYMPHSABS 1.3  --  1.5  MONOABS 0.2  --  0.2  EOSABS 0.1  --  0.1  BASOSABS 0.0  --  0.0    Recent Labs  Lab 02/08/21 1331 02/09/21 0334 02/11/21 0220  NA 139 136 138  K 4.2 3.7 3.8  CL 108 103 105  CO2 '26 26 26  '$ GLUCOSE 102* 101* 123*  BUN '22 20 16  '$ CREATININE 1.01* 1.01* 1.02*  CALCIUM 8.9 8.8* 8.7*  AST 43* 30 23  ALT 60* 45* 32  ALKPHOS 62 49 44  BILITOT 0.4 0.7 0.6  ALBUMIN 3.4* 2.9* 2.9*  MG  --   --  1.9  PROCALCITON  --   --  <0.10  HGBA1C  --  6.1*  --     ------------------------------------------------------------------------------------------------------------------ Recent Labs    02/09/21 0334  CHOL 145   HDL 53  LDLCALC 78  TRIG 72  CHOLHDL 2.7    Lab Results  Component Value Date   HGBA1C 6.1 (H) 02/09/2021   ------------------------------------------------------------------------------------------------------------------ No results for input(s): TSH, T4TOTAL, T3FREE, THYROIDAB in the last 72 hours.  Invalid input(s): FREET3  Cardiac Enzymes No results for input(s): CKMB, TROPONINI, MYOGLOBIN in the last 168 hours.  Invalid input(s): CK ------------------------------------------------------------------------------------------------------------------ No results found for: BNP   Radiology Reports CT ANGIO HEAD W OR WO CONTRAST  Result Date: 02/09/2021 CLINICAL DATA:  Stroke  EXAM: CT ANGIOGRAPHY HEAD AND NECK TECHNIQUE: Multidetector CT imaging of the head and neck was performed using the standard protocol during bolus administration of intravenous contrast. Multiplanar CT image reconstructions and MIPs were obtained to evaluate the vascular anatomy. Carotid stenosis measurements (when applicable) are obtained utilizing NASCET criteria, using the distal internal carotid diameter as the denominator. CONTRAST:  40m OMNIPAQUE IOHEXOL 350 MG/ML SOLN COMPARISON:  None. FINDINGS: CTA NECK FINDINGS SKELETON: There is no bony spinal canal stenosis. No lytic or blastic lesion. OTHER NECK: Normal pharynx, larynx and major salivary glands. No cervical lymphadenopathy. Unremarkable thyroid gland. UPPER CHEST: No pneumothorax or pleural effusion. No nodules or masses. AORTIC ARCH: There is no calcific atherosclerosis of the aortic arch. There is no aneurysm, dissection or hemodynamically significant stenosis of the visualized portion of the aorta. Conventional 3 vessel aortic branching pattern. The visualized proximal subclavian arteries are widely patent. RIGHT CAROTID SYSTEM: Normal without aneurysm, dissection or stenosis. LEFT CAROTID SYSTEM: Normal without aneurysm, dissection or stenosis.  VERTEBRAL ARTERIES: Left dominant configuration. Both origins are clearly patent. There is no dissection, occlusion or flow-limiting stenosis to the skull base (V1-V3 segments). CTA HEAD FINDINGS POSTERIOR CIRCULATION: --Vertebral arteries: Normal V4 segments. --Inferior cerebellar arteries: Normal. --Basilar artery: Normal. --Superior cerebellar arteries: Normal. --Posterior cerebral arteries (PCA): Normal. ANTERIOR CIRCULATION: --Intracranial internal carotid arteries: Normal. --Anterior cerebral arteries (ACA): Normal. Both A1 segments are present. Patent anterior communicating artery (a-comm). --Middle cerebral arteries (MCA): Normal. VENOUS SINUSES: As permitted by contrast timing, patent. ANATOMIC VARIANTS: None Review of the MIP images confirms the above findings. IMPRESSION: Normal CTA of the head and neck. Electronically Signed   By: KUlyses JarredM.D.   On: 02/09/2021 03:35   CT HEAD WO CONTRAST (5MM)  Result Date: 02/08/2021 CLINICAL DATA:  Numbness and tingling, paresthesias, altered mental status EXAM: CT HEAD WITHOUT CONTRAST CT CERVICAL SPINE WITHOUT CONTRAST TECHNIQUE: Multidetector CT imaging of the head and cervical spine was performed following the standard protocol without intravenous contrast. Multiplanar CT image reconstructions of the cervical spine were also generated. COMPARISON:  10/11/2020 FINDINGS: CT HEAD FINDINGS Brain: No acute infarct or hemorrhage. Lateral ventricles and midline structures are unremarkable. No acute extra-axial fluid collections. No mass effect. Vascular: No hyperdense vessel or unexpected calcification. Skull: Normal. Negative for fracture or focal lesion. Sinuses/Orbits: No acute finding. Other: None. CT CERVICAL SPINE FINDINGS Alignment: Mild anterolisthesis of C4 on C5 likely due to facet hypertrophy. Otherwise alignment is anatomic. Skull base and vertebrae: No acute fracture. No primary bone lesion or focal pathologic process. Soft tissues and spinal canal:  Numerous borderline enlarged lymph nodes are seen throughout the neck compatible with patient's known history of B-cell lymphoma. Index lymph nodes are as follows: Right level 2, image 43/6, 8 mm.  Stable. Right level 2, image 43/6, 6 mm, previously 7 mm. Left level 2, image 44/6, 8 mm.  Stable. Left supraclavicular mass, image 84/6, 36 x 17 mm.  Stable. Prevertebral and retropharyngeal soft tissues are unremarkable. Disc levels: There is prominent spondylosis at C5-6 and C6-7, with mild symmetrical neural foraminal encroachment most pronounced at C5-6. Mild diffuse facet hypertrophy greatest at C3-4 and C4-5. Upper chest: Airway is patent. Limited imaging through the lung apices are clear. Other: Reconstructed images demonstrate no additional findings. IMPRESSION: 1. No acute cervical spine fracture. 2. Mild anterolisthesis of C4 on C5, likely due to facet hypertrophy. 3. Prominent spondylosis at C5-6 and C6-7 with minimal neural foraminal encroachment. 4. Diffuse lymphadenopathy throughout the neck, consistent  with B-cell lymphoma. Grossly stable since prior CT 10/11/2020. Electronically Signed   By: Randa Ngo M.D.   On: 02/08/2021 15:35   CT ANGIO NECK W OR WO CONTRAST  Result Date: 02/09/2021 CLINICAL DATA:  Stroke EXAM: CT ANGIOGRAPHY HEAD AND NECK TECHNIQUE: Multidetector CT imaging of the head and neck was performed using the standard protocol during bolus administration of intravenous contrast. Multiplanar CT image reconstructions and MIPs were obtained to evaluate the vascular anatomy. Carotid stenosis measurements (when applicable) are obtained utilizing NASCET criteria, using the distal internal carotid diameter as the denominator. CONTRAST:  46m OMNIPAQUE IOHEXOL 350 MG/ML SOLN COMPARISON:  None. FINDINGS: CTA NECK FINDINGS SKELETON: There is no bony spinal canal stenosis. No lytic or blastic lesion. OTHER NECK: Normal pharynx, larynx and major salivary glands. No cervical lymphadenopathy.  Unremarkable thyroid gland. UPPER CHEST: No pneumothorax or pleural effusion. No nodules or masses. AORTIC ARCH: There is no calcific atherosclerosis of the aortic arch. There is no aneurysm, dissection or hemodynamically significant stenosis of the visualized portion of the aorta. Conventional 3 vessel aortic branching pattern. The visualized proximal subclavian arteries are widely patent. RIGHT CAROTID SYSTEM: Normal without aneurysm, dissection or stenosis. LEFT CAROTID SYSTEM: Normal without aneurysm, dissection or stenosis. VERTEBRAL ARTERIES: Left dominant configuration. Both origins are clearly patent. There is no dissection, occlusion or flow-limiting stenosis to the skull base (V1-V3 segments). CTA HEAD FINDINGS POSTERIOR CIRCULATION: --Vertebral arteries: Normal V4 segments. --Inferior cerebellar arteries: Normal. --Basilar artery: Normal. --Superior cerebellar arteries: Normal. --Posterior cerebral arteries (PCA): Normal. ANTERIOR CIRCULATION: --Intracranial internal carotid arteries: Normal. --Anterior cerebral arteries (ACA): Normal. Both A1 segments are present. Patent anterior communicating artery (a-comm). --Middle cerebral arteries (MCA): Normal. VENOUS SINUSES: As permitted by contrast timing, patent. ANATOMIC VARIANTS: None Review of the MIP images confirms the above findings. IMPRESSION: Normal CTA of the head and neck. Electronically Signed   By: KUlyses JarredM.D.   On: 02/09/2021 03:35   CT Cervical Spine Wo Contrast  Result Date: 02/08/2021 CLINICAL DATA:  Numbness and tingling, paresthesias, altered mental status EXAM: CT HEAD WITHOUT CONTRAST CT CERVICAL SPINE WITHOUT CONTRAST TECHNIQUE: Multidetector CT imaging of the head and cervical spine was performed following the standard protocol without intravenous contrast. Multiplanar CT image reconstructions of the cervical spine were also generated. COMPARISON:  10/11/2020 FINDINGS: CT HEAD FINDINGS Brain: No acute infarct or hemorrhage.  Lateral ventricles and midline structures are unremarkable. No acute extra-axial fluid collections. No mass effect. Vascular: No hyperdense vessel or unexpected calcification. Skull: Normal. Negative for fracture or focal lesion. Sinuses/Orbits: No acute finding. Other: None. CT CERVICAL SPINE FINDINGS Alignment: Mild anterolisthesis of C4 on C5 likely due to facet hypertrophy. Otherwise alignment is anatomic. Skull base and vertebrae: No acute fracture. No primary bone lesion or focal pathologic process. Soft tissues and spinal canal: Numerous borderline enlarged lymph nodes are seen throughout the neck compatible with patient's known history of B-cell lymphoma. Index lymph nodes are as follows: Right level 2, image 43/6, 8 mm.  Stable. Right level 2, image 43/6, 6 mm, previously 7 mm. Left level 2, image 44/6, 8 mm.  Stable. Left supraclavicular mass, image 84/6, 36 x 17 mm.  Stable. Prevertebral and retropharyngeal soft tissues are unremarkable. Disc levels: There is prominent spondylosis at C5-6 and C6-7, with mild symmetrical neural foraminal encroachment most pronounced at C5-6. Mild diffuse facet hypertrophy greatest at C3-4 and C4-5. Upper chest: Airway is patent. Limited imaging through the lung apices are clear. Other: Reconstructed images demonstrate no  additional findings. IMPRESSION: 1. No acute cervical spine fracture. 2. Mild anterolisthesis of C4 on C5, likely due to facet hypertrophy. 3. Prominent spondylosis at C5-6 and C6-7 with minimal neural foraminal encroachment. 4. Diffuse lymphadenopathy throughout the neck, consistent with B-cell lymphoma. Grossly stable since prior CT 10/11/2020. Electronically Signed   By: Randa Ngo M.D.   On: 02/08/2021 15:35   MR BRAIN WO CONTRAST  Result Date: 02/08/2021 CLINICAL DATA:  Neurological deficit, acute, stroke suspected. Left-sided numbness. EXAM: MRI HEAD WITHOUT CONTRAST TECHNIQUE: Multiplanar, multiecho pulse sequences of the brain and  surrounding structures were obtained without intravenous contrast. COMPARISON:  Head CT same day. FINDINGS: Brain: Diffusion imaging shows an acute small vessel infarction in the right lateral thalamus posteriorly. No other acute infarction. Elsewhere, there are a few old small vessel infarctions of the deep white matter. No cortical or large vessel territory infarction. No mass lesion, hemorrhage, hydrocephalus or extra-axial collection. Vascular: Major vessels at the base of the brain show flow. Skull and upper cervical spine: Negative Sinuses/Orbits: Clear/normal Other: None IMPRESSION: Acute small vessel infarction of the posterolateral right thalamus. Mild chronic small-vessel ischemic changes elsewhere affecting the cerebral hemispheric white matter. Electronically Signed   By: Nelson Chimes M.D.   On: 02/08/2021 18:13   MR BRAIN W CONTRAST  Result Date: 02/09/2021 CLINICAL DATA:  Metastatic disease evaluation EXAM: MRI HEAD WITH CONTRAST TECHNIQUE: Multiplanar, multiecho pulse sequences of the brain and surrounding structures were obtained with intravenous contrast. CONTRAST:  62m GADAVIST GADOBUTROL 1 MMOL/ML IV SOLN COMPARISON:  MRI 02/08/2021. FINDINGS: Postcontrast imaging only was performed to further evaluate for metastatic disease. No abnormal enhancement. Please see MRI head from yesterday for characterization of other intracranial findings, including right posterolateral thalamic infarct. IMPRESSION: No abnormal enhancement.  No evidence of metastatic disease. Electronically Signed   By: FMargaretha SheffieldM.D.   On: 02/09/2021 08:55   ECHOCARDIOGRAM COMPLETE  Result Date: 02/09/2021    ECHOCARDIOGRAM REPORT   Patient Name:   MDESHANTE AHUMADADAWALT Date of Exam: 02/09/2021 Medical Rec #:  [id number]              Height:       61.0 in Accession #:    [id number]             Weight:       135.0 lb Date of Birth:  [birth year]              BSA:          1.598 m Patient Age:    89years                 BP:           125/71 mmHg Patient Gender: F                       HR:           85 bpm. Exam Location:  Inpatient Procedure: 2D Echo, Cardiac Doppler, Color Doppler, 3D Echo and Strain Analysis Indications:    Stroke I63.9  History:        Patient has no prior history of Echocardiogram examinations.                 Stroke; Risk Factors:Hypertension and Dyslipidemia. Thyroid                 disease.  Sonographer:    TDarlina SicilianRDCS Referring Phys: [phone] Mins  R PATEL IMPRESSIONS  1. The aortic valve is abnormal. Aortic valve regurgitation is not visualized. No aortic stenosis is present. There is a 1 cm X 1 cm echodensity see in the PLAX and PSAX view associated with the non-coronary cusp. Best seen in image series 8 and 19.  2. Left ventricular ejection fraction, by estimation, is 60 to 65%. The left ventricle has normal function. The left ventricle has no regional wall motion abnormalities. Left ventricular diastolic parameters are consistent with Grade I diastolic dysfunction (impaired relaxation).  3. Right ventricular systolic function is normal. The right ventricular size is normal.  4. The mitral valve is grossly normal. No evidence of mitral valve regurgitation. No evidence of mitral stenosis.  5. The inferior vena cava is normal in size with greater than 50% respiratory variability, suggesting right atrial pressure of 3 mmHg. Comparison(s): No prior Echocardiogram. Conclusion(s)/Recommendation(s): Considered secondary imaging for the aortic valve, TEE or Cardiac CT, for further evaluation. FINDINGS  Left Ventricle: Left ventricular ejection fraction, by estimation, is 60 to 65%. The left ventricle has normal function. The left ventricle has no regional wall motion abnormalities. Global longitudinal strain performed but not reported based on interpreter judgement due to suboptimal tracking. The left ventricular internal cavity size was small. There is no left ventricular hypertrophy. Left ventricular  diastolic parameters are consistent with Grade I diastolic dysfunction (impaired relaxation). Right Ventricle: The right ventricular size is normal. No increase in right ventricular wall thickness. Right ventricular systolic function is normal. Left Atrium: Left atrial size was normal in size. Right Atrium: Right atrial size was normal in size. Pericardium: There is no evidence of pericardial effusion. Mitral Valve: The mitral valve is grossly normal. No evidence of mitral valve regurgitation. No evidence of mitral valve stenosis. Tricuspid Valve: The tricuspid valve is normal in structure. Tricuspid valve regurgitation is not demonstrated. No evidence of tricuspid stenosis. Aortic Valve: The aortic valve is abnormal. There is moderate aortic valve annular calcification. Aortic valve regurgitation is not visualized. No aortic stenosis is present. Pulmonic Valve: The pulmonic valve was normal in structure. Pulmonic valve regurgitation is not visualized. No evidence of pulmonic stenosis. Aorta: The aortic root is normal in size and structure. Venous: The inferior vena cava is normal in size with greater than 50% respiratory variability, suggesting right atrial pressure of 3 mmHg. IAS/Shunts: The atrial septum is grossly normal.  LEFT VENTRICLE PLAX 2D LVIDd:         3.70 cm  Diastology LVIDs:         2.40 cm  LV e' medial:    6.93 cm/s LV PW:         0.80 cm  LV E/e' medial:  11.8 LV IVS:        1.00 cm  LV e' lateral:   6.62 cm/s LVOT diam:     1.70 cm  LV E/e' lateral: 12.4 LV SV:         39 LV SV Index:   25 LVOT Area:     2.27 cm  RIGHT VENTRICLE TAPSE (M-mode): 2.2 cm LEFT ATRIUM             Index       RIGHT ATRIUM          Index LA diam:        2.70 cm 1.69 cm/m  RA Area:     8.41 cm LA Vol (A2C):   13.9 ml 8.70 ml/m  RA Volume:   15.40 ml 9.64  ml/m LA Vol (A4C):   19.1 ml 11.95 ml/m LA Biplane Vol: 17.3 ml 10.82 ml/m  AORTIC VALVE LVOT Vmax:   117.00 cm/s LVOT Vmean:  75.900 cm/s LVOT VTI:    0.174 m   AORTA Ao Root diam: 2.80 cm MITRAL VALVE MV Area (PHT): 5.09 cm    SHUNTS MV Decel Time: 149 msec    Systemic VTI:  0.17 m MV E velocity: 82.00 cm/s  Systemic Diam: 1.70 cm MV A velocity: 82.23 cm/s MV E/A ratio:  1.00 Rudean Haskell MD Electronically signed by Rudean Haskell MD Signature Date/Time: 02/09/2021/4:02:09 PM    Final

## 2021-02-11 NOTE — Plan of Care (Signed)

## 2021-02-12 LAB — CBC WITH DIFFERENTIAL/PLATELET
Abs Immature Granulocytes: 0.01 10*3/uL (ref 0.00–0.07)
Basophils Absolute: 0 10*3/uL (ref 0.0–0.1)
Basophils Relative: 1 %
Eosinophils Absolute: 0.1 10*3/uL (ref 0.0–0.5)
Eosinophils Relative: 3 %
HCT: 35.2 % — ABNORMAL LOW (ref 36.0–46.0)
Hemoglobin: 11.7 g/dL — ABNORMAL LOW (ref 12.0–15.0)
Immature Granulocytes: 0 %
Lymphocytes Relative: 36 %
Lymphs Abs: 1.6 10*3/uL (ref 0.7–4.0)
MCH: 33.1 pg (ref 26.0–34.0)
MCHC: 33.2 g/dL (ref 30.0–36.0)
MCV: 99.7 fL (ref 80.0–100.0)
Monocytes Absolute: 0.2 10*3/uL (ref 0.1–1.0)
Monocytes Relative: 4 %
Neutro Abs: 2.5 10*3/uL (ref 1.7–7.7)
Neutrophils Relative %: 56 %
Platelets: 206 10*3/uL (ref 150–400)
RBC: 3.53 MIL/uL — ABNORMAL LOW (ref 3.87–5.11)
RDW: 13.8 % (ref 11.5–15.5)
WBC: 4.3 10*3/uL (ref 4.0–10.5)
nRBC: 0 % (ref 0.0–0.2)

## 2021-02-12 LAB — COMPREHENSIVE METABOLIC PANEL
ALT: 36 U/L (ref 0–44)
AST: 37 U/L (ref 15–41)
Albumin: 3 g/dL — ABNORMAL LOW (ref 3.5–5.0)
Alkaline Phosphatase: 50 U/L (ref 38–126)
Anion gap: 10 (ref 5–15)
BUN: 15 mg/dL (ref 8–23)
CO2: 25 mmol/L (ref 22–32)
Calcium: 8.9 mg/dL (ref 8.9–10.3)
Chloride: 101 mmol/L (ref 98–111)
Creatinine, Ser: 1.12 mg/dL — ABNORMAL HIGH (ref 0.44–1.00)
GFR, Estimated: 56 mL/min — ABNORMAL LOW (ref >60)
Glucose, Bld: 113 mg/dL — ABNORMAL HIGH (ref 70–99)
Potassium: 4.1 mmol/L (ref 3.5–5.1)
Sodium: 136 mmol/L (ref 135–145)
Total Bilirubin: 0.7 mg/dL (ref 0.3–1.2)
Total Protein: 5.9 g/dL — ABNORMAL LOW (ref 6.5–8.1)

## 2021-02-12 LAB — PROCALCITONIN: Procalcitonin: <0.1 ng/mL

## 2021-02-12 LAB — MAGNESIUM: Magnesium: 1.9 mg/dL (ref 1.7–2.4)

## 2021-02-12 LAB — MRSA NEXT GEN BY PCR, NASAL: MRSA by PCR Next Gen: NOT DETECTED

## 2021-02-12 MED ORDER — LACTATED RINGERS IV SOLN: INTRAVENOUS | Status: AC

## 2021-02-12 NOTE — Progress Notes (Signed)
PROGRESS NOTE                                                                                                                                                                                                             Patient Demographics:    Dawn Prince, is a 60 y.o. female, DOB - [birth year], VPX:[id number]  Outpatient Primary MD for the patient is Ernestene Kiel, MD    LOS - 2  Admit date - 02/08/2021    Chief Complaint  Patient presents with   Numbness       Brief Narrative (HPI from H&P)  - Dawn Prince is a 59 y.o. 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   Subjective:   Patient in bed, appears comfortable, denies any headache, no fever, no chest pain or pressure, no shortness of breath , no abdominal pain. No new focal weakness.    Assessment  & Plan :   L arm Weakness due to CVA likely on 02/05/21 - MRI +ve for  Acute small vessel infarction of the posterolateral right thalamus, CTA head and neck unremarkable- Neuro on board, for now DAPT + Statin, full stroke pathway being followed, her symptoms are better however on echocardiogram there is aortic valve density, case discussed with cardiologist Dr. Johnny Bridge who will conduct TEE on coming Monday.  No change in treatment according to cardiology for now.  Patient denies any history of serious infections of bacteremia, no IV drug use, no history of central line supportive.  Do not think this is infectious in etiology but will check blood cultures and procalcitonin baseline.  TEE on 02/13/21.  2. Stage IIIb small lymphocytic lymphoma: On active observation.   Follows with medical oncology, Dr. Hinton Rao.  CT cervical spine shows diffuse lymphadenopathy throughout the neck consistent with known B-cell lymphoma, stable compared to prior CT in May 2022.  Mild leukopenia noted, fluctuates up and down with normal B12 and folate levels per recent oncology note.  3. HTN - CVA likely > 86 days old, BP borderline, DC low dose ACE for now.  4.  Hypothyroidism.  On Synthroid.  5.  Dyslipidemia.  On statin.  6.  Chronic low back pain.  On Neurontin continue.  7.  Mood disorder.  Continue combination of Seroquel, Prozac and Wellbutrin.  Lab Results  Component Value Date   HGBA1C 6.1 (H) 02/09/2021   Lab Results  Component Value Date   CHOL 145 02/09/2021   HDL 53 02/09/2021   LDLCALC 78 02/09/2021   TRIG 72 02/09/2021   CHOLHDL 2.7 02/09/2021         Condition - Fair  Family Communication  :  husband bedside 02/10/21, 02/11/21, 02/12/21  Code Status :  Full  Consults  :  Neuro, cardiologist Dr. Domenic Polite over the phone  PUD Prophylaxis :  PPI   Procedures  :     TEE  MRI brain with and without contrast.  Acute small vessel infarction of the posterolateral right thalamus. Mild chronic small-vessel ischemic changes elsewhere affecting the cerebral hemispheric white matter.  TTE - 1. The aortic valve is abnormal. Aortic valve regurgitation is not visualized. No aortic stenosis is present. There is a 1 cm X 1 cm echodensity see in the PLAX and PSAX view associated with the non-coronary cusp. Best seen in image series 8 and 19.  2. Left ventricular ejection fraction, by estimation, is 60 to 65%. The left ventricle has normal function. The left ventricle has no regional wall motion abnormalities. Left ventricular diastolic parameters are consistent with Grade I diastolic dysfunction (impaired relaxation).  3. Right ventricular systolic function is normal. The right ventricular size is normal.  4. The mitral valve is grossly normal. No evidence of mitral  valve regurgitation. No evidence of mitral stenosis.  5. The inferior vena cava is normal in size with greater than 50% respiratory variability, suggesting right atrial pressure of 3 mmHg. Comparison(s): No prior Echocardiogram. Conclusion(s)/Recommendation(s): Considered secondary imaging for the aortic valve, TEE or Cardiac CT, for further evaluation.   CTA Head and Neck - Non acute      Disposition Plan  :    Status is: Observation  Dispo: The patient is from: Home              Anticipated d/c is to: Home              Patient currently is not medically stable to d/c.   Difficult to place patient No   DVT Prophylaxis  :    enoxaparin (LOVENOX) injection 40 mg Start: 02/08/21 2045   Lab Results  Component Value Date   PLT 206 02/12/2021    Diet :  Diet Order             Diet NPO time specified Except for: Sips with Meds  Diet effective now           Diet Heart Room service appropriate? Yes; Fluid consistency: Thin  Diet effective now           Diet - low sodium heart healthy                    Inpatient Medications  Scheduled Meds:   stroke: mapping our early stages of recovery book   Does not apply Once   aspirin EC  81 mg Oral Daily   atorvastatin  40 mg Oral Daily   buPROPion  75 mg Oral BID   clopidogrel  75 mg Oral Daily   enoxaparin (LOVENOX) injection  40 mg Subcutaneous Q24H   FLUoxetine  60 mg Oral Daily   gabapentin  800 mg Oral TID   levothyroxine  25 mcg Oral Q0600   pantoprazole  40 mg Oral Daily   QUEtiapine  300 mg Oral QHS   Continuous Infusions: PRN Meds:.acetaminophen **OR** [DISCONTINUED] acetaminophen (TYLENOL) oral liquid 160 mg/5 mL **OR** [DISCONTINUED] acetaminophen, senna-docusate  Antibiotics  :    Anti-infectives (From admission, onward)    None        Time Spent in minutes  30   Lala Lund M.D on 02/12/2021 at 8:59 AM  To page go to [email or web address]   [facility] -  Office  [phone]    See all  Orders from today for further details    Objective:   Vitals:   02/11/21 2013 02/11/21 2340 02/12/21 0312 02/12/21 0736  BP: 138/85 103/66 98/67 116/70  Pulse: 90 78 86 83  Resp: 17 17 16 14   Temp: 98.4 F (36.9 C) 98.4 F (36.9 C) 98 F (36.7 C) 97.9 F (36.6 C)  TempSrc: Oral Oral Oral Oral  SpO2: 99% 100% 94% 94%  Weight:      Height:        Wt Readings from Last 3 Encounters:  02/08/21 61.2 kg  01/25/21 60.4 kg  10/13/20 58.7 kg    No intake or output data in the 24 hours ending 02/12/21 0859    Physical Exam  Awake Alert, No new F.N deficits, Normal affect [place].AT,PERRAL Supple Neck,No JVD, No cervical lymphadenopathy appriciated.  Symmetrical Chest wall movement, Good air movement bilaterally, CTAB RRR,No Gallops, Rubs or new Murmurs, No Parasternal Heave +ve B.Sounds, Abd Soft, No tenderness, No organomegaly appriciated, No rebound - guarding or rigidity. No Cyanosis, Clubbing or edema, No new Rash or bruise    Data Review:    CBC Recent Labs  Lab 02/08/21 1331 02/09/21 0334 02/11/21 0220 02/12/21 0516  WBC 3.8* 3.8* 4.0 4.3  HGB 11.7* 11.1* 10.9* 11.7*  HCT 36.7 34.1* 33.8* 35.2*  PLT 222 206 197 206  MCV 104.0* 102.7* 100.6* 99.7  MCH 33.1 33.4 32.4 33.1  MCHC 31.9 32.6 32.2 33.2  RDW 14.0 13.9 13.8 13.8  LYMPHSABS 1.3  --  1.5 1.6  MONOABS 0.2  --  0.2 0.2  EOSABS 0.1  --  0.1 0.1  BASOSABS 0.0  --  0.0 0.0    Recent Labs  Lab 02/08/21 1331 02/09/21 0334 02/11/21 0220 02/12/21 0516  NA 139 136 138 136  K 4.2 3.7 3.8 4.1  CL 108 103 105 101  CO2 26 26 26 25   GLUCOSE 102* 101* 123* 113*  BUN 22 20 16 15   CREATININE 1.01* 1.01* 1.02* 1.12*  CALCIUM 8.9 8.8* 8.7* 8.9  AST 43* 30 23 37  ALT 60* 45* 32 36  ALKPHOS 62 49 44 50  BILITOT 0.4 0.7 0.6 0.7  ALBUMIN 3.4* 2.9* 2.9* 3.0*  MG  --   --  1.9 1.9  PROCALCITON  --   --  <0.10 <0.10  HGBA1C  --  6.1*  --   --      ------------------------------------------------------------------------------------------------------------------ No results for input(s): CHOL, HDL, LDLCALC, TRIG, CHOLHDL, LDLDIRECT in the last 72 hours.   Lab Results  Component Value Date   HGBA1C 6.1 (H) 02/09/2021   ------------------------------------------------------------------------------------------------------------------ No results for input(s): TSH, T4TOTAL, T3FREE, THYROIDAB in the last 72 hours.  Invalid input(s): FREET3  Cardiac Enzymes No results for input(s): CKMB, TROPONINI, MYOGLOBIN in the last 168 hours.  Invalid input(s): CK ------------------------------------------------------------------------------------------------------------------ No results found for: BNP   Radiology Reports  CT ANGIO HEAD W OR WO CONTRAST  Result Date: 02/09/2021 CLINICAL DATA:  Stroke EXAM: CT ANGIOGRAPHY HEAD AND NECK TECHNIQUE: Multidetector CT imaging of the head and neck was performed using the standard protocol during bolus administration of intravenous contrast. Multiplanar CT image reconstructions and MIPs were obtained to evaluate the vascular anatomy. Carotid stenosis measurements (when applicable) are obtained utilizing NASCET criteria, using the distal internal carotid diameter as the denominator. CONTRAST:  72mL OMNIPAQUE IOHEXOL 350 MG/ML SOLN COMPARISON:  None. FINDINGS: CTA NECK FINDINGS SKELETON: There is no bony spinal canal stenosis. No lytic or blastic lesion. OTHER NECK: Normal pharynx, larynx and major salivary glands. No cervical lymphadenopathy. Unremarkable thyroid gland. UPPER CHEST: No pneumothorax or pleural effusion. No nodules or masses. AORTIC ARCH: There is no calcific atherosclerosis of the aortic arch. There is no aneurysm, dissection or hemodynamically significant stenosis of the visualized portion of the aorta. Conventional 3 vessel aortic branching pattern. The visualized proximal subclavian arteries  are widely patent. RIGHT CAROTID SYSTEM: Normal without aneurysm, dissection or stenosis. LEFT CAROTID SYSTEM: Normal without aneurysm, dissection or stenosis. VERTEBRAL ARTERIES: Left dominant configuration. Both origins are clearly patent. There is no dissection, occlusion or flow-limiting stenosis to the skull base (V1-V3 segments). CTA HEAD FINDINGS POSTERIOR CIRCULATION: --Vertebral arteries: Normal V4 segments. --Inferior cerebellar arteries: Normal. --Basilar artery: Normal. --Superior cerebellar arteries: Normal. --Posterior cerebral arteries (PCA): Normal. ANTERIOR CIRCULATION: --Intracranial internal carotid arteries: Normal. --Anterior cerebral arteries (ACA): Normal. Both A1 segments are present. Patent anterior communicating artery (a-comm). --Middle cerebral arteries (MCA): Normal. VENOUS SINUSES: As permitted by contrast timing, patent. ANATOMIC VARIANTS: None Review of the MIP images confirms the above findings. IMPRESSION: Normal CTA of the head and neck. Electronically Signed   By: Ulyses Jarred M.D.   On: 02/09/2021 03:35   CT HEAD WO CONTRAST (5MM)  Result Date: 02/08/2021 CLINICAL DATA:  Numbness and tingling, paresthesias, altered mental status EXAM: CT HEAD WITHOUT CONTRAST CT CERVICAL SPINE WITHOUT CONTRAST TECHNIQUE: Multidetector CT imaging of the head and cervical spine was performed following the standard protocol without intravenous contrast. Multiplanar CT image reconstructions of the cervical spine were also generated. COMPARISON:  10/11/2020 FINDINGS: CT HEAD FINDINGS Brain: No acute infarct or hemorrhage. Lateral ventricles and midline structures are unremarkable. No acute extra-axial fluid collections. No mass effect. Vascular: No hyperdense vessel or unexpected calcification. Skull: Normal. Negative for fracture or focal lesion. Sinuses/Orbits: No acute finding. Other: None. CT CERVICAL SPINE FINDINGS Alignment: Mild anterolisthesis of C4 on C5 likely due to facet hypertrophy.  Otherwise alignment is anatomic. Skull base and vertebrae: No acute fracture. No primary bone lesion or focal pathologic process. Soft tissues and spinal canal: Numerous borderline enlarged lymph nodes are seen throughout the neck compatible with patient's known history of B-cell lymphoma. Index lymph nodes are as follows: Right level 2, image 43/6, 8 mm.  Stable. Right level 2, image 43/6, 6 mm, previously 7 mm. Left level 2, image 44/6, 8 mm.  Stable. Left supraclavicular mass, image 84/6, 36 x 17 mm.  Stable. Prevertebral and retropharyngeal soft tissues are unremarkable. Disc levels: There is prominent spondylosis at C5-6 and C6-7, with mild symmetrical neural foraminal encroachment most pronounced at C5-6. Mild diffuse facet hypertrophy greatest at C3-4 and C4-5. Upper chest: Airway is patent. Limited imaging through the lung apices are clear. Other: Reconstructed images demonstrate no additional findings. IMPRESSION: 1. No acute cervical spine fracture. 2. Mild anterolisthesis of C4 on C5, likely due to facet hypertrophy. 3. Prominent spondylosis at  C5-6 and C6-7 with minimal neural foraminal encroachment. 4. Diffuse lymphadenopathy throughout the neck, consistent with B-cell lymphoma. Grossly stable since prior CT 10/11/2020. Electronically Signed   By: Randa Ngo M.D.   On: 02/08/2021 15:35   CT ANGIO NECK W OR WO CONTRAST  Result Date: 02/09/2021 CLINICAL DATA:  Stroke EXAM: CT ANGIOGRAPHY HEAD AND NECK TECHNIQUE: Multidetector CT imaging of the head and neck was performed using the standard protocol during bolus administration of intravenous contrast. Multiplanar CT image reconstructions and MIPs were obtained to evaluate the vascular anatomy. Carotid stenosis measurements (when applicable) are obtained utilizing NASCET criteria, using the distal internal carotid diameter as the denominator. CONTRAST:  70mL OMNIPAQUE IOHEXOL 350 MG/ML SOLN COMPARISON:  None. FINDINGS: CTA NECK FINDINGS SKELETON:  There is no bony spinal canal stenosis. No lytic or blastic lesion. OTHER NECK: Normal pharynx, larynx and major salivary glands. No cervical lymphadenopathy. Unremarkable thyroid gland. UPPER CHEST: No pneumothorax or pleural effusion. No nodules or masses. AORTIC ARCH: There is no calcific atherosclerosis of the aortic arch. There is no aneurysm, dissection or hemodynamically significant stenosis of the visualized portion of the aorta. Conventional 3 vessel aortic branching pattern. The visualized proximal subclavian arteries are widely patent. RIGHT CAROTID SYSTEM: Normal without aneurysm, dissection or stenosis. LEFT CAROTID SYSTEM: Normal without aneurysm, dissection or stenosis. VERTEBRAL ARTERIES: Left dominant configuration. Both origins are clearly patent. There is no dissection, occlusion or flow-limiting stenosis to the skull base (V1-V3 segments). CTA HEAD FINDINGS POSTERIOR CIRCULATION: --Vertebral arteries: Normal V4 segments. --Inferior cerebellar arteries: Normal. --Basilar artery: Normal. --Superior cerebellar arteries: Normal. --Posterior cerebral arteries (PCA): Normal. ANTERIOR CIRCULATION: --Intracranial internal carotid arteries: Normal. --Anterior cerebral arteries (ACA): Normal. Both A1 segments are present. Patent anterior communicating artery (a-comm). --Middle cerebral arteries (MCA): Normal. VENOUS SINUSES: As permitted by contrast timing, patent. ANATOMIC VARIANTS: None Review of the MIP images confirms the above findings. IMPRESSION: Normal CTA of the head and neck. Electronically Signed   By: Ulyses Jarred M.D.   On: 02/09/2021 03:35   CT Cervical Spine Wo Contrast  Result Date: 02/08/2021 CLINICAL DATA:  Numbness and tingling, paresthesias, altered mental status EXAM: CT HEAD WITHOUT CONTRAST CT CERVICAL SPINE WITHOUT CONTRAST TECHNIQUE: Multidetector CT imaging of the head and cervical spine was performed following the standard protocol without intravenous contrast. Multiplanar  CT image reconstructions of the cervical spine were also generated. COMPARISON:  10/11/2020 FINDINGS: CT HEAD FINDINGS Brain: No acute infarct or hemorrhage. Lateral ventricles and midline structures are unremarkable. No acute extra-axial fluid collections. No mass effect. Vascular: No hyperdense vessel or unexpected calcification. Skull: Normal. Negative for fracture or focal lesion. Sinuses/Orbits: No acute finding. Other: None. CT CERVICAL SPINE FINDINGS Alignment: Mild anterolisthesis of C4 on C5 likely due to facet hypertrophy. Otherwise alignment is anatomic. Skull base and vertebrae: No acute fracture. No primary bone lesion or focal pathologic process. Soft tissues and spinal canal: Numerous borderline enlarged lymph nodes are seen throughout the neck compatible with patient's known history of B-cell lymphoma. Index lymph nodes are as follows: Right level 2, image 43/6, 8 mm.  Stable. Right level 2, image 43/6, 6 mm, previously 7 mm. Left level 2, image 44/6, 8 mm.  Stable. Left supraclavicular mass, image 84/6, 36 x 17 mm.  Stable. Prevertebral and retropharyngeal soft tissues are unremarkable. Disc levels: There is prominent spondylosis at C5-6 and C6-7, with mild symmetrical neural foraminal encroachment most pronounced at C5-6. Mild diffuse facet hypertrophy greatest at C3-4 and C4-5. Upper chest: Airway  is patent. Limited imaging through the lung apices are clear. Other: Reconstructed images demonstrate no additional findings. IMPRESSION: 1. No acute cervical spine fracture. 2. Mild anterolisthesis of C4 on C5, likely due to facet hypertrophy. 3. Prominent spondylosis at C5-6 and C6-7 with minimal neural foraminal encroachment. 4. Diffuse lymphadenopathy throughout the neck, consistent with B-cell lymphoma. Grossly stable since prior CT 10/11/2020. Electronically Signed   By: Randa Ngo M.D.   On: 02/08/2021 15:35   MR BRAIN WO CONTRAST  Result Date: 02/08/2021 CLINICAL DATA:  Neurological  deficit, acute, stroke suspected. Left-sided numbness. EXAM: MRI HEAD WITHOUT CONTRAST TECHNIQUE: Multiplanar, multiecho pulse sequences of the brain and surrounding structures were obtained without intravenous contrast. COMPARISON:  Head CT same day. FINDINGS: Brain: Diffusion imaging shows an acute small vessel infarction in the right lateral thalamus posteriorly. No other acute infarction. Elsewhere, there are a few old small vessel infarctions of the deep white matter. No cortical or large vessel territory infarction. No mass lesion, hemorrhage, hydrocephalus or extra-axial collection. Vascular: Major vessels at the base of the brain show flow. Skull and upper cervical spine: Negative Sinuses/Orbits: Clear/normal Other: None IMPRESSION: Acute small vessel infarction of the posterolateral right thalamus. Mild chronic small-vessel ischemic changes elsewhere affecting the cerebral hemispheric white matter. Electronically Signed   By: Nelson Chimes M.D.   On: 02/08/2021 18:13   MR BRAIN W CONTRAST  Result Date: 02/09/2021 CLINICAL DATA:  Metastatic disease evaluation EXAM: MRI HEAD WITH CONTRAST TECHNIQUE: Multiplanar, multiecho pulse sequences of the brain and surrounding structures were obtained with intravenous contrast. CONTRAST:  27mL GADAVIST GADOBUTROL 1 MMOL/ML IV SOLN COMPARISON:  MRI 02/08/2021. FINDINGS: Postcontrast imaging only was performed to further evaluate for metastatic disease. No abnormal enhancement. Please see MRI head from yesterday for characterization of other intracranial findings, including right posterolateral thalamic infarct. IMPRESSION: No abnormal enhancement.  No evidence of metastatic disease. Electronically Signed   By: Margaretha Sheffield M.D.   On: 02/09/2021 08:55   ECHOCARDIOGRAM COMPLETE  Result Date: 02/09/2021    ECHOCARDIOGRAM REPORT   Patient Name:   ELVERTA DIMICELI Wisham Date of Exam: 02/09/2021 Medical Rec #:  [id number]               Height:       61.0 in Accession  #:    [id number]              Weight:       135.0 lb Date of Birth:  [birth year]               BSA:          1.598 m Patient Age:    19 years                BP:           125/71 mmHg Patient Gender: F                       HR:           85 bpm. Exam Location:  Inpatient Procedure: 2D Echo, Cardiac Doppler, Color Doppler, 3D Echo and Strain Analysis Indications:    Stroke I63.9  History:        Patient has no prior history of Echocardiogram examinations.                 Stroke; Risk Factors:Hypertension and Dyslipidemia. Thyroid  disease.  Sonographer:    Darlina Sicilian RDCS Referring Phys: [id number] [place]  1. The aortic valve is abnormal. Aortic valve regurgitation is not visualized. No aortic stenosis is present. There is a 1 cm X 1 cm echodensity see in the PLAX and PSAX view associated with the non-coronary cusp. Best seen in image series 8 and 19.  2. Left ventricular ejection fraction, by estimation, is 60 to 65%. The left ventricle has normal function. The left ventricle has no regional wall motion abnormalities. Left ventricular diastolic parameters are consistent with Grade I diastolic dysfunction (impaired relaxation).  3. Right ventricular systolic function is normal. The right ventricular size is normal.  4. The mitral valve is grossly normal. No evidence of mitral valve regurgitation. No evidence of mitral stenosis.  5. The inferior vena cava is normal in size with greater than 50% respiratory variability, suggesting right atrial pressure of 3 mmHg. Comparison(s): No prior Echocardiogram. Conclusion(s)/Recommendation(s): Considered secondary imaging for the aortic valve, TEE or Cardiac CT, for further evaluation. FINDINGS  Left Ventricle: Left ventricular ejection fraction, by estimation, is 60 to 65%. The left ventricle has normal function. The left ventricle has no regional wall motion abnormalities. Global longitudinal strain performed but not reported based on  interpreter judgement due to suboptimal tracking. The left ventricular internal cavity size was small. There is no left ventricular hypertrophy. Left ventricular diastolic parameters are consistent with Grade I diastolic dysfunction (impaired relaxation). Right Ventricle: The right ventricular size is normal. No increase in right ventricular wall thickness. Right ventricular systolic function is normal. Left Atrium: Left atrial size was normal in size. Right Atrium: Right atrial size was normal in size. Pericardium: There is no evidence of pericardial effusion. Mitral Valve: The mitral valve is grossly normal. No evidence of mitral valve regurgitation. No evidence of mitral valve stenosis. Tricuspid Valve: The tricuspid valve is normal in structure. Tricuspid valve regurgitation is not demonstrated. No evidence of tricuspid stenosis. Aortic Valve: The aortic valve is abnormal. There is moderate aortic valve annular calcification. Aortic valve regurgitation is not visualized. No aortic stenosis is present. Pulmonic Valve: The pulmonic valve was normal in structure. Pulmonic valve regurgitation is not visualized. No evidence of pulmonic stenosis. Aorta: The aortic root is normal in size and structure. Venous: The inferior vena cava is normal in size with greater than 50% respiratory variability, suggesting right atrial pressure of 3 mmHg. IAS/Shunts: The atrial septum is grossly normal.  LEFT VENTRICLE PLAX 2D LVIDd:         3.70 cm  Diastology LVIDs:         2.40 cm  LV e' medial:    6.93 cm/s LV PW:         0.80 cm  LV E/e' medial:  11.8 LV IVS:        1.00 cm  LV e' lateral:   6.62 cm/s LVOT diam:     1.70 cm  LV E/e' lateral: 12.4 LV SV:         39 LV SV Index:   25 LVOT Area:     2.27 cm  RIGHT VENTRICLE TAPSE (M-mode): 2.2 cm LEFT ATRIUM             Index       RIGHT ATRIUM          Index LA diam:        2.70 cm 1.69 cm/m  RA Area:     8.41 cm LA Vol (A2C):  13.9 ml 8.70 ml/m  RA Volume:   15.40 ml 9.64  ml/m LA Vol (A4C):   19.1 ml 11.95 ml/m LA Biplane Vol: 17.3 ml 10.82 ml/m  AORTIC VALVE LVOT Vmax:   117.00 cm/s LVOT Vmean:  75.900 cm/s LVOT VTI:    0.174 m  AORTA Ao Root diam: 2.80 cm MITRAL VALVE MV Area (PHT): 5.09 cm    SHUNTS MV Decel Time: 149 msec    Systemic VTI:  0.17 m MV E velocity: 82.00 cm/s  Systemic Diam: 1.70 cm MV A velocity: 82.23 cm/s MV E/A ratio:  1.00 Rudean Haskell MD Electronically signed by Rudean Haskell MD Signature Date/Time: 02/09/2021/4:02:09 PM    Final

## 2021-02-12 NOTE — Plan of Care (Signed)

## 2021-02-12 NOTE — H&P (View-Only) (Signed)
   [place] has been requested to perform a transesophageal echocardiogram on [facility] for stroke.  After careful review of history and examination, the risks and benefits of transesophageal echocardiogram have been explained including risks of esophageal damage, perforation (1:10,000 risk), bleeding, pharyngeal hematoma as well as other potential complications associated with conscious sedation including aspiration, arrhythmia, respiratory failure and death. Patient denies any dysphagia or history of radiation to chest. Alternatives to treatment were discussed, questions were answered. Patient is willing to proceed.   Procedure scheduled for 02/13/2021 at 8:30am with Dr. Oval Linsey. Will make NPO at midnight and place preprocedural orders.  Darreld Mclean, PA-C 02/12/2021 3:42 PM

## 2021-02-12 NOTE — Progress Notes (Signed)
   [place] has been requested to perform a transesophageal echocardiogram on [facility] for stroke.  After careful review of history and examination, the risks and benefits of transesophageal echocardiogram have been explained including risks of esophageal damage, perforation (1:10,000 risk), bleeding, pharyngeal hematoma as well as other potential complications associated with conscious sedation including aspiration, arrhythmia, respiratory failure and death. Patient denies any dysphagia or history of radiation to chest. Alternatives to treatment were discussed, questions were answered. Patient is willing to proceed.   Procedure scheduled for 02/13/2021 at 8:30am with Dr. Oval Linsey. Will make NPO at midnight and place preprocedural orders.  Darreld Mclean, PA-C 02/12/2021 3:42 PM

## 2021-02-13 ENCOUNTER — Encounter (HOSPITAL_COMMUNITY): Payer: Self-pay | Admitting: Internal Medicine

## 2021-02-13 ENCOUNTER — Inpatient Hospital Stay (HOSPITAL_COMMUNITY): Payer: Managed Care, Other (non HMO) | Admitting: Certified Registered Nurse Anesthetist

## 2021-02-13 ENCOUNTER — Encounter (HOSPITAL_COMMUNITY)
Admission: EM | Disposition: A | Discharge status: Home / Self Care | Payer: Self-pay | Source: Home / Self Care | Attending: Internal Medicine

## 2021-02-13 ENCOUNTER — Inpatient Hospital Stay (HOSPITAL_COMMUNITY): Payer: Managed Care, Other (non HMO)

## 2021-02-13 DIAGNOSIS — I639 Cerebral infarction, unspecified: Secondary | ICD-10-CM

## 2021-02-13 HISTORY — PX: TEE WITHOUT CARDIOVERSION: SHX5443

## 2021-02-13 HISTORY — PX: BUBBLE STUDY: SHX6837

## 2021-02-13 LAB — COMPREHENSIVE METABOLIC PANEL
ALT: 35 U/L (ref 0–44)
AST: 27 U/L (ref 15–41)
Albumin: 3 g/dL — ABNORMAL LOW (ref 3.5–5.0)
Alkaline Phosphatase: 46 U/L (ref 38–126)
Anion gap: 7 (ref 5–15)
BUN: 16 mg/dL (ref 8–23)
CO2: 29 mmol/L (ref 22–32)
Calcium: 8.9 mg/dL (ref 8.9–10.3)
Chloride: 103 mmol/L (ref 98–111)
Creatinine, Ser: 0.89 mg/dL (ref 0.44–1.00)
GFR, Estimated: >60 mL/min (ref >60)
Glucose, Bld: 114 mg/dL — ABNORMAL HIGH (ref 70–99)
Potassium: 4.8 mmol/L (ref 3.5–5.1)
Sodium: 139 mmol/L (ref 135–145)
Total Bilirubin: 0.3 mg/dL (ref 0.3–1.2)
Total Protein: 5.6 g/dL — ABNORMAL LOW (ref 6.5–8.1)

## 2021-02-13 LAB — CBC WITH DIFFERENTIAL/PLATELET
Abs Immature Granulocytes: 0.02 10*3/uL (ref 0.00–0.07)
Basophils Absolute: 0 10*3/uL (ref 0.0–0.1)
Basophils Relative: 1 %
Eosinophils Absolute: 0.1 10*3/uL (ref 0.0–0.5)
Eosinophils Relative: 3 %
HCT: 32.7 % — ABNORMAL LOW (ref 36.0–46.0)
Hemoglobin: 10.5 g/dL — ABNORMAL LOW (ref 12.0–15.0)
Immature Granulocytes: 1 %
Lymphocytes Relative: 42 %
Lymphs Abs: 1.7 10*3/uL (ref 0.7–4.0)
MCH: 32.7 pg (ref 26.0–34.0)
MCHC: 32.1 g/dL (ref 30.0–36.0)
MCV: 101.9 fL — ABNORMAL HIGH (ref 80.0–100.0)
Monocytes Absolute: 0.2 10*3/uL (ref 0.1–1.0)
Monocytes Relative: 4 %
Neutro Abs: 2 10*3/uL (ref 1.7–7.7)
Neutrophils Relative %: 49 %
Platelets: 200 10*3/uL (ref 150–400)
RBC: 3.21 MIL/uL — ABNORMAL LOW (ref 3.87–5.11)
RDW: 13.6 % (ref 11.5–15.5)
WBC: 4 10*3/uL (ref 4.0–10.5)
nRBC: 0 % (ref 0.0–0.2)

## 2021-02-13 LAB — MAGNESIUM: Magnesium: 2 mg/dL (ref 1.7–2.4)

## 2021-02-13 SURGERY — ECHOCARDIOGRAM, TRANSESOPHAGEAL
Anesthesia: Monitor Anesthesia Care

## 2021-02-13 MED ORDER — ASPIRIN EC 81 MG PO TBEC: 81.0000 mg | DELAYED_RELEASE_TABLET | Freq: Every day | ORAL | 2 refills | Status: DC

## 2021-02-13 MED ORDER — SODIUM CHLORIDE 0.9 % IV SOLN: INTRAVENOUS | Status: DC

## 2021-02-13 MED ORDER — PROPOFOL 500 MG/50ML IV EMUL
INTRAVENOUS | Status: DC | PRN
  Administered 2021-02-13: 75 ug/kg/min via INTRAVENOUS

## 2021-02-13 MED ORDER — LIDOCAINE 2% (20 MG/ML) 5 ML SYRINGE
INTRAMUSCULAR | Status: DC | PRN
  Administered 2021-02-13: 80 mg via INTRAVENOUS

## 2021-02-13 MED ORDER — ATORVASTATIN CALCIUM 40 MG PO TABS: 40.0000 mg | ORAL_TABLET | Freq: Every day | ORAL | 0 refills | Status: DC

## 2021-02-13 MED ORDER — PROPOFOL 10 MG/ML IV BOLUS
INTRAVENOUS | Status: DC | PRN
  Administered 2021-02-13: 20 mg via INTRAVENOUS
  Administered 2021-02-13: 10 mg via INTRAVENOUS
  Administered 2021-02-13: 20 mg via INTRAVENOUS
  Administered 2021-02-13 (×2): 10 mg via INTRAVENOUS

## 2021-02-13 MED ORDER — CLOPIDOGREL BISULFATE 75 MG PO TABS: 75.0000 mg | ORAL_TABLET | Freq: Every day | ORAL | 0 refills | Status: DC

## 2021-02-13 NOTE — Transfer of Care (Signed)
Immediate Anesthesia Transfer of Care Note  Patient: Dawn Prince  Procedure(s) Performed: TRANSESOPHAGEAL ECHOCARDIOGRAM (TEE) BUBBLE STUDY  Patient Location: Endoscopy Unit  Anesthesia Type:MAC  Level of Consciousness: awake, alert  and oriented  Airway & Oxygen Therapy: Patient Spontanous Breathing  Post-op Assessment: Report given to RN and Post -op Vital signs reviewed and stable  Post vital signs: Reviewed and stable  Last Vitals:  Vitals Value Taken Time  BP 135/83 02/13/21 0926  Temp    Pulse 89 02/13/21 0927  Resp 15 02/13/21 0927  SpO2 98 % 02/13/21 0927  Vitals shown include unvalidated device data.  Last Pain:  Vitals:   02/13/21 0821  TempSrc: Temporal  PainSc: 0-No pain         Complications: No notable events documented.

## 2021-02-13 NOTE — TOC Transition Note (Signed)
Transition of Care [facility]) - CM/SW Discharge Note   Patient Details  Name: Dawn Prince MRN: [id number] Date of Birth: 1961-01-11  Transition of [facility]) CM/SW Contact:  Pollie Friar, RN Phone Number: 02/13/2021, 10:48 AM   Clinical Narrative:    Patient is discharging home today with self care. Pt has PCP and d/c medications sent to pharmacy of choice.  Pt has transportation home. No needs per TOC.   Final next level of care: Home/Self Care Barriers to Discharge: No Barriers Identified   Patient Goals and CMS Choice        Discharge Placement                       Discharge Plan and Services                                     Social Determinants of Health (SDOH) Interventions     Readmission Risk Interventions No flowsheet data found.

## 2021-02-13 NOTE — CV Procedure (Signed)
Brief TEE Note  LVEF 60-65% No LA/LAA thrombus or mass Trivial MR No significant aortic valve pathology.  No AS or AR No ASD/PFO by color flow Doppler or saline microcaviation study. There was a late positive bubble study after 12-13 beats, which can be indicative of an extracardiac shunt.  For additional details see full report.  Dawn Prince C. Oval Linsey, MD, [facility] 02/13/2021 9:24 AM

## 2021-02-13 NOTE — Interval H&P Note (Signed)
History and Physical Interval Note:  02/13/2021 7:56 AM  Dawn Prince  has presented today for surgery, with the diagnosis of STROKE.  The various methods of treatment have been discussed with the patient and family. After consideration of risks, benefits and other options for treatment, the patient has consented to  Procedure(s): TRANSESOPHAGEAL ECHOCARDIOGRAM (TEE) (N/A) as a surgical intervention.  The patient's history has been reviewed, patient examined, no change in status, stable for surgery.  I have reviewed the patient's chart and labs.  Questions were answered to the patient's satisfaction.     Skeet Latch, MD

## 2021-02-13 NOTE — Anesthesia Preprocedure Evaluation (Addendum)
Anesthesia Evaluation  Patient identified by MRN, date of birth, ID band Patient awake    Reviewed: Allergy & Precautions, H&P , NPO status , Patient's Chart, lab work & pertinent test results, reviewed documented beta blocker date and time   Airway Mallampati: II  TM Distance: >3 FB Neck ROM: Full    Dental no notable dental hx. (+) Teeth Intact, Dental Advisory Given, Caps   Pulmonary sleep apnea ,    Pulmonary exam normal breath sounds clear to auscultation       Cardiovascular Exercise Tolerance: Good negative cardio ROS Normal cardiovascular exam Rhythm:Regular Rate:Normal     Neuro/Psych PSYCHIATRIC DISORDERS Depression Bipolar Disorder  Neuromuscular disease CVA, Residual Symptoms    GI/Hepatic Neg liver ROS, GERD  Medicated,  Endo/Other  Hypothyroidism   Renal/GU negative Renal ROS  negative genitourinary   Musculoskeletal  (+) Arthritis , Osteoarthritis,    Abdominal   Peds  Hematology negative hematology ROS (+)   Anesthesia Other Findings   Reproductive/Obstetrics negative OB ROS                            Anesthesia Physical Anesthesia Plan  ASA: 3  Anesthesia Plan: MAC   Post-op Pain Management:    Induction: Intravenous  PONV Risk Score and Plan: 2  Airway Management Planned: Natural Airway, Nasal Cannula and Simple Face Mask  Additional Equipment: None  Intra-op Plan:   Post-operative Plan:   Informed Consent: I have reviewed the patients History and Physical, chart, labs and discussed the procedure including the risks, benefits and alternatives for the proposed anesthesia with the patient or authorized representative who has indicated his/her understanding and acceptance.     Dental Advisory Given  Plan Discussed with: CRNA and Anesthesiologist  Anesthesia Plan Comments: Dawn Prince is a 60 y.o. 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

## 2021-02-13 NOTE — Progress Notes (Addendum)
  Echocardiogram TEE has been performed.  Dawn Prince F 02/13/2021, 9:36 AM

## 2021-02-13 NOTE — Discharge Summary (Signed)
Dawn Prince YBO:[id number] DOB: 1960-07-30 DOA: 02/08/2021  PCP: Ernestene Kiel, MD  Admit date: 02/08/2021  Discharge date: 02/13/2021  Admitted From: Home  Disposition:  Home   Recommendations for Outpatient Follow-up:   Follow up with PCP in 1-2 weeks  PCP Please obtain BMP/CBC, 2 view CXR in 1week,  (see Discharge instructions)   PCP Please follow up on the following pending results:    Home Health: None   Equipment/Devices: None  Consultations: Neuro, Cards Discharge Condition: Stable    CODE STATUS: Full    Diet Recommendation: Heart Healthy   Diet Order             Diet Heart Room service appropriate? Yes; Fluid consistency: Thin  Diet effective now           Diet - low sodium heart healthy                    Chief Complaint  Patient presents with   Numbness     Brief history of present illness from the day of admission and additional interim summary    Dawn Prince is a 60 y.o. 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                                                                 Hospital Course   L arm Weakness due to CVA likely on 02/05/21 - MRI +ve for  Acute small vessel infarction of the posterolateral right thalamus, CTA head and neck unremarkable- Neuro on board, for now DAPT + Statin, full stroke pathway being followed, her symptoms are better however on echocardiogram there is aortic valve density, this was followed up with a NML TEE, will be discharged home with 3  weeks of Plavix and indefinite aspirin and statin when outpatient neurology and PCP follow-up.   2. Stage IIIb small lymphocytic lymphoma: On active observation.  Follows with medical oncology, Dr. Hinton Rao.  CT cervical spine shows diffuse lymphadenopathy throughout the neck consistent with known B-cell lymphoma, stable compared to prior CT in May 2022.  Mild leukopenia noted, fluctuates up and down with normal B12 and folate levels per recent oncology note.   3. HTN - CVA likely > 49 days old, resume Home ACE.   4.  Hypothyroidism.  On Synthroid.   5.  Dyslipidemia.  On statin dose increased for LDL goal < 70.   6.  Chronic low back pain.  On Neurontin continue.   7.  Mood disorder.  Continue combination of Seroquel, Prozac and Wellbutrin.  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 ([place])   Hypothyroidism   Hyperlipidemia    Discharge instructions    Discharge Instructions     Diet - low sodium heart healthy   Complete by: As directed    Discharge instructions   Complete by: As directed    Follow with Primary MD Ernestene Kiel, MD in 7 days   Get CBC, CMP -  checked next visit within 1 week by Primary MD    Activity: As tolerated with Full fall precautions use walker/cane & assistance as needed  Disposition Home     Diet: Heart Healthy    Special Instructions: If you have smoked or chewed Tobacco  in the last 2 yrs please stop smoking, stop any regular Alcohol  and or any Recreational drug use.  On your next visit with your primary care physician please Get Medicines reviewed and adjusted.  Please request your Prim.MD to go over all Hospital Tests and Procedure/Radiological results at the follow up, please get all Hospital records sent to your Prim MD by signing hospital release before you go home.  If you experience worsening of your admission  symptoms, develop shortness of breath, life threatening emergency, suicidal or homicidal thoughts you must seek medical attention immediately by calling 911 or calling your MD immediately  if symptoms less severe.  You Must read complete instructions/literature along with all the possible adverse reactions/side effects for all the Medicines you take and that have been prescribed to you. Take any new Medicines after you have completely understood and accpet all the possible adverse reactions/side effects.   Increase activity slowly   Complete by: As directed        Discharge Medications   Allergies as of 02/13/2021   No Known Allergies      Medication List     STOP taking these medications    diclofenac 75 MG EC tablet Commonly known as: VOLTAREN       TAKE these medications    ascorbic acid 500 MG tablet Commonly known as: VITAMIN C Take 1 tablet by mouth daily.   aspirin EC 81 MG tablet Take 1 tablet (81 mg total) by mouth daily.   atorvastatin 40 MG tablet Commonly known as: Lipitor Take 1 tablet (40 mg total) by mouth daily. What changed:  medication strength how much to take   buPROPion 75 MG tablet Commonly known as: WELLBUTRIN Take 75 mg by mouth 2 (two) times daily.   CALCIUM CARBONATE-VITAMIN D3 PO Take 1 tablet by mouth daily.   chlorpheniramine 4 MG tablet Commonly known as: CHLOR-TRIMETON Take 4 mg by mouth daily as needed for allergies.   clopidogrel 75 MG tablet Commonly known as: PLAVIX Take 1 tablet (75 mg total) by mouth daily.   ELDERBERRY PO Take 1 tablet by mouth daily.   FLUoxetine 20 MG capsule Commonly known as: PROZAC Take 60 mg by mouth daily.   gabapentin 100 MG capsule Commonly known as: NEURONTIN Take 200 mg by mouth 3 (three) times daily. Takes along with 600 mg capsule to total 800 mg   gabapentin 300 MG capsule Commonly known as: NEURONTIN Take 600 mg by mouth 3 (three) times daily. Takes along with 200 mg capsule to  total 800 mg   levothyroxine 25 MCG tablet Commonly known as: SYNTHROID Take 25 mcg by mouth every morning.   MULTIVITAMIN ADULT  PO Take 1 capsule by mouth daily.   NEXIUM 24HR PO Take 1 capsule by mouth at bedtime.   Omega 3 1000 MG Caps Take 1,000 mg by mouth daily.   PROBIOTIC BLEND PO Take 1 capsule by mouth daily.   QUEtiapine 100 MG tablet Commonly known as: SEROQUEL Take 100 mg by mouth daily as needed (depression).   QUEtiapine 300 MG tablet Commonly known as: SEROQUEL Take 300 mg by mouth at bedtime.   ramipril 1.25 MG capsule Commonly known as: ALTACE Take 1.25 mg by mouth daily.   zinc gluconate 50 MG tablet Take 50 mg by mouth daily.         Follow-up Information     Prochnau, Chrys Racer, MD. Schedule an appointment as soon as possible for a visit in 1 week(s).   Specialty: Internal Medicine Contact information: [place]. [place] [address] [phone]         [facility]. Schedule an appointment as soon as possible for a visit in 2 week(s).   Contact information: [address]     [place] [address] [phone]                Major procedures and Radiology Reports - PLEASE review detailed and final reports thoroughly  -      CT ANGIO HEAD W OR WO CONTRAST  Result Date: 02/09/2021 CLINICAL DATA:  Stroke EXAM: CT ANGIOGRAPHY HEAD AND NECK TECHNIQUE: Multidetector CT imaging of the head and neck was performed using the standard protocol during bolus administration of intravenous contrast. Multiplanar CT image reconstructions and MIPs were obtained to evaluate the vascular anatomy. Carotid stenosis measurements (when applicable) are obtained utilizing NASCET criteria, using the distal internal carotid diameter as the denominator. CONTRAST:  16mL OMNIPAQUE IOHEXOL 350 MG/ML SOLN COMPARISON:  None. FINDINGS: CTA NECK FINDINGS SKELETON: There is no bony spinal canal stenosis. No lytic or  blastic lesion. OTHER NECK: Normal pharynx, larynx and major salivary glands. No cervical lymphadenopathy. Unremarkable thyroid gland. UPPER CHEST: No pneumothorax or pleural effusion. No nodules or masses. AORTIC ARCH: There is no calcific atherosclerosis of the aortic arch. There is no aneurysm, dissection or hemodynamically significant stenosis of the visualized portion of the aorta. Conventional 3 vessel aortic branching pattern. The visualized proximal subclavian arteries are widely patent. RIGHT CAROTID SYSTEM: Normal without aneurysm, dissection or stenosis. LEFT CAROTID SYSTEM: Normal without aneurysm, dissection or stenosis. VERTEBRAL ARTERIES: Left dominant configuration. Both origins are clearly patent. There is no dissection, occlusion or flow-limiting stenosis to the skull base (V1-V3 segments). CTA HEAD FINDINGS POSTERIOR CIRCULATION: --Vertebral arteries: Normal V4 segments. --Inferior cerebellar arteries: Normal. --Basilar artery: Normal. --Superior cerebellar arteries: Normal. --Posterior cerebral arteries (PCA): Normal. ANTERIOR CIRCULATION: --Intracranial internal carotid arteries: Normal. --Anterior cerebral arteries (ACA): Normal. Both A1 segments are present. Patent anterior communicating artery (a-comm). --Middle cerebral arteries (MCA): Normal. VENOUS SINUSES: As permitted by contrast timing, patent. ANATOMIC VARIANTS: None Review of the MIP images confirms the above findings. IMPRESSION: Normal CTA of the head and neck. Electronically Signed   By: Ulyses Jarred M.D.   On: 02/09/2021 03:35   CT HEAD WO CONTRAST (5MM)  Result Date: 02/08/2021 CLINICAL DATA:  Numbness and tingling, paresthesias, altered mental status EXAM: CT HEAD WITHOUT CONTRAST CT CERVICAL SPINE WITHOUT CONTRAST TECHNIQUE: Multidetector CT imaging of the head and cervical spine was performed following the standard protocol without intravenous contrast. Multiplanar CT image reconstructions of the cervical spine were also  generated. COMPARISON:  10/11/2020 FINDINGS: CT HEAD FINDINGS Brain: No acute infarct or hemorrhage. Lateral ventricles and midline structures are unremarkable. No acute extra-axial fluid collections. No mass effect. Vascular: No hyperdense vessel or unexpected calcification. Skull: Normal. Negative for fracture or focal lesion. Sinuses/Orbits: No acute finding. Other: None. CT CERVICAL SPINE FINDINGS Alignment: Mild anterolisthesis of C4 on C5 likely due to facet hypertrophy. Otherwise alignment is anatomic. Skull base and vertebrae: No acute fracture. No primary bone lesion or focal pathologic process. Soft tissues and spinal canal: Numerous borderline enlarged lymph nodes are seen throughout the neck compatible with patient's known history of B-cell lymphoma. Index lymph nodes are as follows: Right level 2, image 43/6, 8 mm.  Stable. Right level 2, image 43/6, 6 mm, previously 7 mm. Left level 2, image 44/6, 8 mm.  Stable. Left supraclavicular mass, image 84/6, 36 x 17 mm.  Stable. Prevertebral and retropharyngeal soft tissues are unremarkable. Disc levels: There is prominent spondylosis at C5-6 and C6-7, with mild symmetrical neural foraminal encroachment most pronounced at C5-6. Mild diffuse facet hypertrophy greatest at C3-4 and C4-5. Upper chest: Airway is patent. Limited imaging through the lung apices are clear. Other: Reconstructed images demonstrate no additional findings. IMPRESSION: 1. No acute cervical spine fracture. 2. Mild anterolisthesis of C4 on C5, likely due to facet hypertrophy. 3. Prominent spondylosis at C5-6 and C6-7 with minimal neural foraminal encroachment. 4. Diffuse lymphadenopathy throughout the neck, consistent with B-cell lymphoma. Grossly stable since prior CT 10/11/2020. Electronically Signed   By: Randa Ngo M.D.   On: 02/08/2021 15:35   CT ANGIO NECK W OR WO CONTRAST  Result Date: 02/09/2021 CLINICAL DATA:  Stroke EXAM: CT ANGIOGRAPHY HEAD AND NECK TECHNIQUE:  Multidetector CT imaging of the head and neck was performed using the standard protocol during bolus administration of intravenous contrast. Multiplanar CT image reconstructions and MIPs were obtained to evaluate the vascular anatomy. Carotid stenosis measurements (when applicable) are obtained utilizing NASCET criteria, using the distal internal carotid diameter as the denominator. CONTRAST:  20mL OMNIPAQUE IOHEXOL 350 MG/ML SOLN COMPARISON:  None. FINDINGS: CTA NECK FINDINGS SKELETON: There is no bony spinal canal stenosis. No lytic or blastic lesion. OTHER NECK: Normal pharynx, larynx and major salivary glands. No cervical lymphadenopathy. Unremarkable thyroid gland. UPPER CHEST: No pneumothorax or pleural effusion. No nodules or masses. AORTIC ARCH: There is no calcific atherosclerosis of the aortic arch. There is no aneurysm, dissection or hemodynamically significant stenosis of the visualized portion of the aorta. Conventional 3 vessel aortic branching pattern. The visualized proximal subclavian arteries are widely patent. RIGHT CAROTID SYSTEM: Normal without aneurysm, dissection or stenosis. LEFT CAROTID SYSTEM: Normal without aneurysm, dissection or stenosis. VERTEBRAL ARTERIES: Left dominant configuration. Both origins are clearly patent. There is no dissection, occlusion or flow-limiting stenosis to the skull base (V1-V3 segments). CTA HEAD FINDINGS POSTERIOR CIRCULATION: --Vertebral arteries: Normal V4 segments. --Inferior cerebellar arteries: Normal. --Basilar artery: Normal. --Superior cerebellar arteries: Normal. --Posterior cerebral arteries (PCA): Normal. ANTERIOR CIRCULATION: --Intracranial internal carotid arteries: Normal. --Anterior cerebral arteries (ACA): Normal. Both A1 segments are present. Patent anterior communicating artery (a-comm). --Middle cerebral arteries (MCA): Normal. VENOUS SINUSES: As permitted by contrast timing, patent. ANATOMIC VARIANTS: None Review of the MIP images confirms  the above findings. IMPRESSION: Normal CTA of the head and neck. Electronically Signed   By: Ulyses Jarred M.D.   On: 02/09/2021 03:35   CT Cervical Spine Wo Contrast  Result Date: 02/08/2021 CLINICAL DATA:  Numbness and tingling, paresthesias, altered mental status EXAM: CT  HEAD WITHOUT CONTRAST CT CERVICAL SPINE WITHOUT CONTRAST TECHNIQUE: Multidetector CT imaging of the head and cervical spine was performed following the standard protocol without intravenous contrast. Multiplanar CT image reconstructions of the cervical spine were also generated. COMPARISON:  10/11/2020 FINDINGS: CT HEAD FINDINGS Brain: No acute infarct or hemorrhage. Lateral ventricles and midline structures are unremarkable. No acute extra-axial fluid collections. No mass effect. Vascular: No hyperdense vessel or unexpected calcification. Skull: Normal. Negative for fracture or focal lesion. Sinuses/Orbits: No acute finding. Other: None. CT CERVICAL SPINE FINDINGS Alignment: Mild anterolisthesis of C4 on C5 likely due to facet hypertrophy. Otherwise alignment is anatomic. Skull base and vertebrae: No acute fracture. No primary bone lesion or focal pathologic process. Soft tissues and spinal canal: Numerous borderline enlarged lymph nodes are seen throughout the neck compatible with patient's known history of B-cell lymphoma. Index lymph nodes are as follows: Right level 2, image 43/6, 8 mm.  Stable. Right level 2, image 43/6, 6 mm, previously 7 mm. Left level 2, image 44/6, 8 mm.  Stable. Left supraclavicular mass, image 84/6, 36 x 17 mm.  Stable. Prevertebral and retropharyngeal soft tissues are unremarkable. Disc levels: There is prominent spondylosis at C5-6 and C6-7, with mild symmetrical neural foraminal encroachment most pronounced at C5-6. Mild diffuse facet hypertrophy greatest at C3-4 and C4-5. Upper chest: Airway is patent. Limited imaging through the lung apices are clear. Other: Reconstructed images demonstrate no additional  findings. IMPRESSION: 1. No acute cervical spine fracture. 2. Mild anterolisthesis of C4 on C5, likely due to facet hypertrophy. 3. Prominent spondylosis at C5-6 and C6-7 with minimal neural foraminal encroachment. 4. Diffuse lymphadenopathy throughout the neck, consistent with B-cell lymphoma. Grossly stable since prior CT 10/11/2020. Electronically Signed   By: Randa Ngo M.D.   On: 02/08/2021 15:35   MR BRAIN WO CONTRAST  Result Date: 02/08/2021 CLINICAL DATA:  Neurological deficit, acute, stroke suspected. Left-sided numbness. EXAM: MRI HEAD WITHOUT CONTRAST TECHNIQUE: Multiplanar, multiecho pulse sequences of the brain and surrounding structures were obtained without intravenous contrast. COMPARISON:  Head CT same day. FINDINGS: Brain: Diffusion imaging shows an acute small vessel infarction in the right lateral thalamus posteriorly. No other acute infarction. Elsewhere, there are a few old small vessel infarctions of the deep white matter. No cortical or large vessel territory infarction. No mass lesion, hemorrhage, hydrocephalus or extra-axial collection. Vascular: Major vessels at the base of the brain show flow. Skull and upper cervical spine: Negative Sinuses/Orbits: Clear/normal Other: None IMPRESSION: Acute small vessel infarction of the posterolateral right thalamus. Mild chronic small-vessel ischemic changes elsewhere affecting the cerebral hemispheric white matter. Electronically Signed   By: Nelson Chimes M.D.   On: 02/08/2021 18:13   MR BRAIN W CONTRAST  Result Date: 02/09/2021 CLINICAL DATA:  Metastatic disease evaluation EXAM: MRI HEAD WITH CONTRAST TECHNIQUE: Multiplanar, multiecho pulse sequences of the brain and surrounding structures were obtained with intravenous contrast. CONTRAST:  48mL GADAVIST GADOBUTROL 1 MMOL/ML IV SOLN COMPARISON:  MRI 02/08/2021. FINDINGS: Postcontrast imaging only was performed to further evaluate for metastatic disease. No abnormal enhancement. Please see  MRI head from yesterday for characterization of other intracranial findings, including right posterolateral thalamic infarct. IMPRESSION: No abnormal enhancement.  No evidence of metastatic disease. Electronically Signed   By: Margaretha Sheffield M.D.   On: 02/09/2021 08:55   ECHOCARDIOGRAM COMPLETE  Result Date: 02/09/2021    ECHOCARDIOGRAM REPORT   Patient Name:   LILOU KNEIP Kluesner Date of Exam: 02/09/2021 Medical Rec #:  [id number]  Height:       61.0 in Accession #:    [id number]              Weight:       135.0 lb Date of Birth:  1960-11-30               BSA:          1.598 m Patient Age:    60 years                BP:           125/71 mmHg Patient Gender: F                       HR:           85 bpm. Exam Location:  Inpatient Procedure: 2D Echo, Cardiac Doppler, Color Doppler, 3D Echo and Strain Analysis Indications:    Stroke I63.9  History:        Patient has no prior history of Echocardiogram examinations.                 Stroke; Risk Factors:Hypertension and Dyslipidemia. Thyroid                 disease.  Sonographer:    Darlina Sicilian RDCS Referring Phys: [id number] [place]  1. The aortic valve is abnormal. Aortic valve regurgitation is not visualized. No aortic stenosis is present. There is a 1 cm X 1 cm echodensity see in the PLAX and PSAX view associated with the non-coronary cusp. Best seen in image series 8 and 19.  2. Left ventricular ejection fraction, by estimation, is 60 to 65%. The left ventricle has normal function. The left ventricle has no regional wall motion abnormalities. Left ventricular diastolic parameters are consistent with Grade I diastolic dysfunction (impaired relaxation).  3. Right ventricular systolic function is normal. The right ventricular size is normal.  4. The mitral valve is grossly normal. No evidence of mitral valve regurgitation. No evidence of mitral stenosis.  5. The inferior vena cava is normal in size with greater than 50%  respiratory variability, suggesting right atrial pressure of 3 mmHg. Comparison(s): No prior Echocardiogram. Conclusion(s)/Recommendation(s): Considered secondary imaging for the aortic valve, TEE or Cardiac CT, for further evaluation. FINDINGS  Left Ventricle: Left ventricular ejection fraction, by estimation, is 60 to 65%. The left ventricle has normal function. The left ventricle has no regional wall motion abnormalities. Global longitudinal strain performed but not reported based on interpreter judgement due to suboptimal tracking. The left ventricular internal cavity size was small. There is no left ventricular hypertrophy. Left ventricular diastolic parameters are consistent with Grade I diastolic dysfunction (impaired relaxation). Right Ventricle: The right ventricular size is normal. No increase in right ventricular wall thickness. Right ventricular systolic function is normal. Left Atrium: Left atrial size was normal in size. Right Atrium: Right atrial size was normal in size. Pericardium: There is no evidence of pericardial effusion. Mitral Valve: The mitral valve is grossly normal. No evidence of mitral valve regurgitation. No evidence of mitral valve stenosis. Tricuspid Valve: The tricuspid valve is normal in structure. Tricuspid valve regurgitation is not demonstrated. No evidence of tricuspid stenosis. Aortic Valve: The aortic valve is abnormal. There is moderate aortic valve annular calcification. Aortic valve regurgitation is not visualized. No aortic stenosis is present. Pulmonic Valve: The pulmonic valve was normal in structure. Pulmonic valve regurgitation is not visualized. No evidence of pulmonic stenosis.  Aorta: The aortic root is normal in size and structure. Venous: The inferior vena cava is normal in size with greater than 50% respiratory variability, suggesting right atrial pressure of 3 mmHg. IAS/Shunts: The atrial septum is grossly normal.  LEFT VENTRICLE PLAX 2D LVIDd:         3.70 cm   Diastology LVIDs:         2.40 cm  LV e' medial:    6.93 cm/s LV PW:         0.80 cm  LV E/e' medial:  11.8 LV IVS:        1.00 cm  LV e' lateral:   6.62 cm/s LVOT diam:     1.70 cm  LV E/e' lateral: 12.4 LV SV:         39 LV SV Index:   25 LVOT Area:     2.27 cm  RIGHT VENTRICLE TAPSE (M-mode): 2.2 cm LEFT ATRIUM             Index       RIGHT ATRIUM          Index LA diam:        2.70 cm 1.69 cm/m  RA Area:     8.41 cm LA Vol (A2C):   13.9 ml 8.70 ml/m  RA Volume:   15.40 ml 9.64 ml/m LA Vol (A4C):   19.1 ml 11.95 ml/m LA Biplane Vol: 17.3 ml 10.82 ml/m  AORTIC VALVE LVOT Vmax:   117.00 cm/s LVOT Vmean:  75.900 cm/s LVOT VTI:    0.174 m  AORTA Ao Root diam: 2.80 cm MITRAL VALVE MV Area (PHT): 5.09 cm    SHUNTS MV Decel Time: 149 msec    Systemic VTI:  0.17 m MV E velocity: 82.00 cm/s  Systemic Diam: 1.70 cm MV A velocity: 82.23 cm/s MV E/A ratio:  1.00 Rudean Haskell MD Electronically signed by Rudean Haskell MD Signature Date/Time: 02/09/2021/4:02:09 PM    Final    ECHO TEE  Result Date: 02/13/2021    TRANSESOPHOGEAL ECHO REPORT   Patient Name:   LOUISIANA SEARLES Popoff Date of Exam: 02/13/2021 Medical Rec #:  [id number]               Height:       61.0 in Accession #:    [id number]              Weight:       135.0 lb Date of Birth:  05-Jan-1961               BSA:          1.598 m Patient Age:    67 years                BP:           135/83 mmHg Patient Gender: F                       HR:           72 bpm. Exam Location:  Inpatient Procedure: Transesophageal Echo, Color Doppler, Cardiac Doppler and [place]            Contrast Bubble Study Indications:     Stroke  History:         Patient has prior history of Echocardiogram examinations, most                  recent 02/09/2021. Stroke.  Sonographer:  Merrie Roof RDCS Referring Phys:  [id number] Darreld Mclean Diagnosing Phys: Skeet Latch MD PROCEDURE: After discussion of the risks and benefits of a TEE, an informed consent was obtained  from the patient. The transesophogeal probe was passed without difficulty through the esophogus of the patient. Local oropharyngeal anesthetic was provided with Cetacaine. Sedation performed by different physician. The patient was monitored while under deep sedation. The patient's vital signs; including heart rate, blood pressure, and oxygen saturation; remained stable throughout the procedure. The patient developed no complications during the procedure. IMPRESSIONS  1. Left ventricular ejection fraction, by estimation, is 60 to 65%. The left ventricle has normal function. The left ventricle has no regional wall motion abnormalities.  2. Right ventricular systolic function is normal. The right ventricular size is normal.  3. No left atrial/left atrial appendage thrombus was detected.  4. The mitral valve is normal in structure. Trivial mitral valve regurgitation. No evidence of mitral stenosis.  5. The aortic valve is tricuspid. Aortic valve regurgitation is not visualized. No aortic stenosis is present.  6. The inferior vena cava is normal in size with greater than 50% respiratory variability, suggesting right atrial pressure of 3 mmHg. Conclusion(s)/Recommendation(s): Normal biventricular function without evidence of hemodynamically significant valvular heart disease. No LA/LAA thrombus identified. Negative bubble study for interatrial shunt. No intracardiac source of embolism detected  on this on this transesophageal echocardiogram. FINDINGS  Left Ventricle: Left ventricular ejection fraction, by estimation, is 60 to 65%. The left ventricle has normal function. The left ventricle has no regional wall motion abnormalities. The left ventricular internal cavity size was normal in size. There is  no left ventricular hypertrophy. Right Ventricle: The right ventricular size is normal. No increase in right ventricular wall thickness. Right ventricular systolic function is normal. Left Atrium: Left atrial size was normal  in size. No left atrial/left atrial appendage thrombus was detected. Right Atrium: Right atrial size was normal in size. Pericardium: Trivial pericardial effusion is present. Mitral Valve: The mitral valve is normal in structure. Trivial mitral valve regurgitation. No evidence of mitral valve stenosis. Tricuspid Valve: The tricuspid valve is normal in structure. Tricuspid valve regurgitation is not demonstrated. No evidence of tricuspid stenosis. Aortic Valve: The aortic valve is tricuspid. Aortic valve regurgitation is not visualized. No aortic stenosis is present. Pulmonic Valve: The pulmonic valve was normal in structure. Pulmonic valve regurgitation is not visualized. No evidence of pulmonic stenosis. Aorta: The aortic root is normal in size and structure and the aortic root, ascending aorta, aortic arch and descending aorta are all structurally normal, with no evidence of dilitation or obstruction. Venous: The inferior vena cava is normal in size with greater than 50% respiratory variability, suggesting right atrial pressure of 3 mmHg. IAS/Shunts: No atrial level shunt detected by color flow Doppler. Agitated saline contrast was given intravenously to evaluate for intracardiac shunting. There is no evidence of an atrial septal defect. Additional Comments: Bubble study was negative for intracardiac shunt. There were few late postive bubbles which may indicate an extracardiac shunt. Blubbles appeared in the left atrium after 12-13 beats. Aortic valve imaging was unremarkable. Skeet Latch MD Electronically signed by Skeet Latch MD Signature Date/Time: 02/13/2021/10:16:27 AM    Final      Today   Subjective    Dawn Prince today has no headache,no chest abdominal pain,no new weakness tingling or numbness, feels much better wants to go home today. Mild L arm tingling.   Objective   Blood pressure (!) 159/88, pulse  71, temperature 97.7 F (36.5 C), temperature source Temporal, resp. rate (!)  21, height 5\' 1"  (1.549 m), weight 61.2 kg, SpO2 98 %.   Intake/Output Summary (Last 24 hours) at 02/13/2021 1049 Last data filed at 02/13/2021 0920 Gross per 24 hour  Intake 300 ml  Output --  Net 300 ml    Exam  Awake Alert, No new F.N deficits, Normal affect [place].AT,PERRAL Supple Neck,No JVD, No cervical lymphadenopathy appriciated.  Symmetrical Chest wall movement, Good air movement bilaterally, CTAB RRR,No Gallops,Rubs or new Murmurs, No Parasternal Heave +ve B.Sounds, Abd Soft, Non tender, No organomegaly appriciated, No rebound -guarding or rigidity. No Cyanosis, Clubbing or edema, No new Rash or bruise   Data Review   CBC w Diff:  Lab Results  Component Value Date   WBC 4.0 02/13/2021   HGB 10.5 (L) 02/13/2021   HCT 32.7 (L) 02/13/2021   PLT 200 02/13/2021   LYMPHOPCT 42 02/13/2021   MONOPCT 4 02/13/2021   EOSPCT 3 02/13/2021   BASOPCT 1 02/13/2021    CMP:  Lab Results  Component Value Date   NA 139 02/13/2021   NA 138 01/25/2021   K 4.8 02/13/2021   CL 103 02/13/2021   CO2 29 02/13/2021   BUN 16 02/13/2021   BUN 27 (A) 01/25/2021   CREATININE 0.89 02/13/2021   GLU 113 01/25/2021   PROT 5.6 (L) 02/13/2021   ALBUMIN 3.0 (L) 02/13/2021   BILITOT 0.3 02/13/2021   ALKPHOS 46 02/13/2021   AST 27 02/13/2021   ALT 35 02/13/2021  . Lab Results  Component Value Date   HGBA1C 6.1 (H) 02/09/2021   Lab Results  Component Value Date   CHOL 145 02/09/2021   HDL 53 02/09/2021   LDLCALC 78 02/09/2021   TRIG 72 02/09/2021   CHOLHDL 2.7 02/09/2021     Total Time in preparing paper work, data evaluation and todays exam - 38 minutes  Lala Lund M.D on 02/13/2021 at 10:49 AM  [facility]

## 2021-02-13 NOTE — Anesthesia Postprocedure Evaluation (Signed)
Anesthesia Post Note  Patient: Dawn Prince  Procedure(s) Performed: TRANSESOPHAGEAL ECHOCARDIOGRAM (TEE) BUBBLE STUDY     Patient location during evaluation: PACU Anesthesia Type: MAC Level of consciousness: awake and alert Pain management: pain level controlled Vital Signs Assessment: post-procedure vital signs reviewed and stable Respiratory status: spontaneous breathing, nonlabored ventilation, respiratory function stable and patient connected to nasal cannula oxygen Cardiovascular status: stable and blood pressure returned to baseline Postop Assessment: no apparent nausea or vomiting Anesthetic complications: no   No notable events documented.  Last Vitals:  Vitals:   02/13/21 0937 02/13/21 0945  BP: (!) 144/87 (!) 159/88  Pulse: 77 71  Resp: 14 (!) 21  Temp:    SpO2: 97% 98%    Last Pain:  Vitals:   02/13/21 0945  TempSrc:   PainSc: 0-No pain                 Tahje Borawski

## 2021-02-13 NOTE — Anesthesia Procedure Notes (Signed)
Procedure Name: MAC Date/Time: 02/13/2021 8:56 AM Performed by: Dorthea C[place], CRNA Pre-anesthesia Checklist: Patient identified, Patient being monitored, Timeout performed, Suction available and Emergency Drugs available Patient Re-evaluated:Patient Re-evaluated prior to induction Oxygen Delivery Method: Nasal cannula Preoxygenation: Pre-oxygenation with 100% oxygen Induction Type: IV induction Placement Confirmation: positive ETCO2 and CO2 detector Dental Injury: Teeth and Oropharynx as per pre-operative assessment

## 2021-02-15 ENCOUNTER — Other Ambulatory Visit: Payer: Self-pay | Admitting: Emergency Medicine

## 2021-02-15 ENCOUNTER — Telehealth: Payer: Self-pay | Admitting: Emergency Medicine

## 2021-02-15 LAB — CULTURE, BLOOD (ROUTINE X 2)
Culture: NO GROWTH
Culture: NO GROWTH
Special Requests: ADEQUATE
Special Requests: ADEQUATE

## 2021-02-15 MED ORDER — CLOPIDOGREL BISULFATE 75 MG PO TABS: 75.0000 mg | ORAL_TABLET | Freq: Every day | ORAL | 1 refills | Status: DC

## 2021-02-15 NOTE — Telephone Encounter (Signed)
Called patient and advised her a refill has been sent into her pharmacy for the plavix.    Patient denied further questions, verbalized understanding and expressed appreciation for the phone call.

## 2021-03-29 ENCOUNTER — Ambulatory Visit: Payer: Managed Care, Other (non HMO) | Admitting: Adult Health

## 2021-03-29 ENCOUNTER — Encounter: Payer: Self-pay | Admitting: Adult Health

## 2021-03-29 VITALS — BP 122/78 | HR 75 | Ht 62.0 in | Wt 130.0 lb

## 2021-03-29 DIAGNOSIS — R7303 Prediabetes: Secondary | ICD-10-CM

## 2021-03-29 DIAGNOSIS — E785 Hyperlipidemia, unspecified: Secondary | ICD-10-CM

## 2021-03-29 DIAGNOSIS — I6381 Other cerebral infarction due to occlusion or stenosis of small artery: Secondary | ICD-10-CM | POA: Diagnosis not present

## 2021-03-29 DIAGNOSIS — I10 Essential (primary) hypertension: Secondary | ICD-10-CM | POA: Diagnosis not present

## 2021-03-29 NOTE — Progress Notes (Signed)
[facility] [address] [place]. [place] [address] [phone]       [place] Dawn Prince Date of Birth:  Mar 05, 1961 Medical Record Number:  [id number]   Reason for Referral:  hospital stroke follow up    SUBJECTIVE:   CHIEF COMPLAINT:  Chief Complaint  Patient presents with   New Patient (Initial Visit)    Rm 3 with husband here for hospital follow up. Pt reports she has been doing ok since d/c. Reports some paresthesia along the left wrist and some short term memory loss.    HPI:   [name] is a 60 y.o. 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  Personally reviewed hospitalization pertinent progress notes, lab work and imaging.  Evaluated by Dr. 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  CTA head/neck unremarkable.  2D echo EF 60 to 65% although echodensity noted therefore TEE completed which showed normal biventricular function without evidence of hemodynamically significant valvular heart disease, no evidence of thrombus and negative bubble study for intra-arterial shunt.  LDL 78.  A1c 6.1.  Recommended DAPT for 3 weeks and aspirin alone.  Increase home dose atorvastatin from 20 mg to 40 mg daily.  Evidence of pre-DM -no prior DM diagnosis.  No prior stroke history. CT spine showed diffuse lymphadenopathy throughout the neck consistent with B-cell lymphoma grossly stable since prior CT 09/2020.   Today, 03/29/2021, patient being seen for stroke follow up accompanied by her husband.   Overall stable since discharge. Denies new stroke/TIA symptoms.  Fatigues quick -  present prior to her stroke with slight worsening post stroke Left outer hand and outer foot numbness - greatly improving  Short term memory difficulties and occasional difficulty finding the correct word but has been gradually improving Balance has been gradually improving - no longer uses AD  Chronic back pain - [facility] recently received right hip injection for bursitis  Continues on aspirin and plavix despite 3 week recommendations - mild bruising otherwise no side effects Continues on atorvastatin 79m daily - denies side effects Blood pressure 122/78 - occasionally monitors at home - usually stable  No further concerns at this time      PERTINENT IMAGING  CT head No acute infarct or hemorrhage, no metastatic disease.  CT spine No acute cervical spine fracture. Mild anterolisthesis of C4 on C5, likely due to facet hypertrophy. Prominent spondylosis at C5-6 and C6-7 with minimal neural foraminal encroachment.  Diffuse lymphadenopathy throughout the neck, consistent with B-cell lymphoma. Grossly stable since prior CT 10/11/2020. CTA head/neck normal MRI brain Acute small vessel infarction of the posterolateral right thalamus. Mild chronic small-vessel ischemic changes elsewhere affecting the cerebral hemispheric white matter. 2D Echo EF 60 to 65% however aortic valve disease noted TEE EF 60 to 65%.  Normal biventricular function with no evidence of hemodynamically significant valvular heart disease, no evidence of thrombus negative bubble study for intra-arterial shunt    ROS:   14 system review of systems performed and negative with exception of those listed in HPI  PMH:  Past Medical History:  Diagnosis Date   Allergy    seasonal  Arthritis    Bipolar 1 disorder (HCC)    Cancer (HCC)    non hodgkins lymphoma   Depression    GERD (gastroesophageal reflux disease)    History of degenerative disc disease    Hyperlipidemia    Obstructive  sleep apnea    Osteopenia    Scoliosis    Thyroid disease    hypothyroidism   Vitamin D deficiency     PSH:  Past Surgical History:  Procedure Laterality Date   BREAST BIOPSY     BUBBLE STUDY  02/13/2021   Procedure: BUBBLE STUDY;  Surgeon: Skeet Latch, MD;  Location: [place];  Service: Cardiovascular;;   CARPAL TUNNEL RELEASE Bilateral 2002   CESAREAN SECTION     x2   COLONOSCOPY  07/26/2008   Melanosis coli. Small internal hemorrhoids.    ENDOSCOPIC PLANTAR FASCIOTOMY     ESOPHAGOGASTRODUODENOSCOPY  03/17/2013   Mild gastritis. Status post esophageal dilatation.   LYMPH NODE BIOPSY     TEE WITHOUT CARDIOVERSION N/A 02/13/2021   Procedure: TRANSESOPHAGEAL ECHOCARDIOGRAM (TEE);  Surgeon: Skeet Latch, MD;  [facility]: [facility] ENDOSCOPY;  Service: Cardiovascular;  Laterality: N/A;   WISDOM TOOTH EXTRACTION      Social History:  Social History   Socioeconomic History   Marital status: Married    Spouse name: Not on file   Number of children: 2   Years of education: Not on file   Highest education level: Associate degree: academic program  Occupational History   Not on file  Tobacco Use   Smoking status: Never   Smokeless tobacco: Never  Vaping Use   Vaping Use: Never used  Substance and Sexual Activity   Alcohol use: Not Currently   Drug use: Never   Sexual activity: Not on file  Other Topics Concern   Not on file  Social History Narrative   Right handed    1-2 cups of coffee per week   Lives at home with husband    Social Determinants of Health   Financial Resource Strain: Not on file  Food Insecurity: Not on file  Transportation Needs: Not on file  Physical Activity: Not on file  Stress: Not on file  Social Connections: Not on file  Intimate Partner Violence: Not on file    Family History:  Family History  Problem Relation Age of Onset   Prostate cancer Father 51   Melanoma Father 3   High blood pressure Father    Breast cancer Maternal  Aunt    Multiple myeloma Maternal Aunt    Breast cancer Maternal Aunt    Colon cancer Neg Hx    Colon polyps Neg Hx    Esophageal cancer Neg Hx    Rectal cancer Neg Hx    Stomach cancer Neg Hx     Medications:   Current Outpatient Medications on File Prior to Visit  Medication Sig Dispense Refill   ascorbic acid (VITAMIN C) 500 MG tablet Take 1 tablet by mouth daily.     aspirin EC 81 MG tablet Take 1 tablet (81 mg total) by mouth daily. 30 tablet 2   atorvastatin (LIPITOR) 40 MG tablet Take 1 tablet (40 mg total) by mouth daily. 30 tablet 0   buPROPion (WELLBUTRIN) 75 MG tablet Take 75 mg by mouth 2 (two) times daily.     Calcium Carb-Cholecalciferol (CALCIUM CARBONATE-VITAMIN D3 PO) Take 1 tablet by mouth daily.     chlorpheniramine (CHLOR-TRIMETON) 4 MG tablet Take 4 mg by mouth daily as needed  for allergies.     ELDERBERRY PO Take 1 tablet by mouth daily.     Esomeprazole Magnesium (NEXIUM 24HR PO) Take 1 capsule by mouth at bedtime.     FLUoxetine (PROZAC) 20 MG capsule Take 60 mg by mouth daily.     gabapentin (NEURONTIN) 100 MG capsule Take 200 mg by mouth 3 (three) times daily. Takes along with 600 mg capsule to total 800 mg     gabapentin (NEURONTIN) 300 MG capsule Take 600 mg by mouth 3 (three) times daily. Takes along with 200 mg capsule to total 800 mg     levothyroxine (SYNTHROID) 25 MCG tablet Take 25 mcg by mouth every morning.     Multiple Vitamin (MULTIVITAMIN ADULT PO) Take 1 capsule by mouth daily.     Omega 3 1000 MG CAPS Take 1,000 mg by mouth daily.     Probiotic Product (PROBIOTIC BLEND PO) Take 1 capsule by mouth daily.     QUEtiapine (SEROQUEL) 100 MG tablet Take 100 mg by mouth daily as needed (depression).     QUEtiapine (SEROQUEL) 300 MG tablet Take 300 mg by mouth at bedtime.     ramipril (ALTACE) 1.25 MG capsule Take 1.25 mg by mouth daily.     zinc gluconate 50 MG tablet Take 50 mg by mouth daily.     No current facility-administered medications on file  prior to visit.    Allergies:  No Known Allergies    OBJECTIVE:  Physical Exam  Vitals:   03/29/21 0817  BP: 122/78  Pulse: 75  SpO2: 97%  Weight: 130 lb (59 kg)  Height: 5' 2"  (1.575 m)   Body mass index is 23.78 kg/m. No results found.  Post stroke PHQ 2/9 Depression screen PHQ 2/9 03/29/2021  Decreased Interest 0  Down, Depressed, Hopeless 0  PHQ - 2 Score 0     General: Frail petite very pleasant middle-aged Caucasian female, seated, in no evident distress Head: head normocephalic and atraumatic.   Neck: supple with no carotid or supraclavicular bruits Cardiovascular: regular rate and rhythm, no murmurs Musculoskeletal: no deformity Skin:  no rash/petichiae Vascular:  Normal pulses all extremities   Neurologic Exam Mental Status: Awake and fully alert.  Fluent speech and language.  Oriented to place and time. Recent memory subjectively mildly impaired and remote memory intact. Attention span, concentration and fund of knowledge appropriate. Mood and affect appropriate.  Cranial Nerves: Fundoscopic exam reveals sharp disc margins. Pupils equal, briskly reactive to light. Extraocular movements full without nystagmus. Visual fields full to confrontation. Hearing intact. Facial sensation intact. Face, tongue, palate moves normally and symmetrically.  Motor: Normal bulk and tone. Normal strength in all tested extremity muscles Sensory.:  Decreased sensation left outer arm and hand and outer edge of foot Coordination: Rapid alternating movements normal in all extremities. Finger-to-nose and heel-to-shin performed accurately bilaterally. Gait and Station: Arises from chair without difficulty. Stance is normal. Gait demonstrates normal stride length and balance with mild favoring of RLE initially without use of assistive device. Tandem walk and heel toe moderate difficulty.  Reflexes: 1+ and symmetric. Toes downgoing.     NIHSS  1 Modified Rankin  2      ASSESSMENT:  Mckinzy Fuller is a 60 y.o. year old female right thalamic stroke on 02/08/2021 secondary to small vessel disease. 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      PLAN:  Right thalamic stroke:  Residual deficit:  mild left arm/hand and foot sensory impairment - gradually improving. Mild imbalance likely combination of post stroke paresthesias and chronic back pain. Discussed potential benefit working with therapies (was not evaluated while admitted) but she declines interest at this time -she will call if interested in pursuing in the future Discussed post stroke fatigue and mild short-term memory loss including typical recovery time and further exercise Continue aspirin 81 mg daily  and atorvastatin 40 mg daily for secondary stroke prevention.   Advised to discontinue Plavix as 3 weeks DAPT completed Discussed secondary stroke prevention measures and importance of close PCP follow up for aggressive stroke risk factor management. I have gone over the pathophysiology of stroke, warning signs and symptoms, risk factors and their management in some detail with instructions to go to the closest emergency room for symptoms of concern. HTN: BP goal <130/90.  Stable on current regimen per PCP HLD: LDL goal <70. Recent LDL 78.  Continue atorvastatin 40 mg daily.  Request ongoing monitoring and management per PCP Pre-DMII: A1c goal<7.0. Recent A1c 6.1.  Routine monitoring by PCP    Follow up in 4-5 months or call earlier if needed   CC:  [facility] provider: Dr. Leonie Man PCP: Ernestene Kiel, MD    I spent 57 minutes of face-to-face and non-face-to-face time with patient and husband.  This included previsit chart review including review of recent hospitalization, lab review, study review, electronic health record documentation, patient education regarding recent stroke including etiology, secondary stroke prevention measures and  importance of managing stroke risk factors, residual deficits and typical recovery time and answered all other questions to patient and husband's satisfaction  Frann Rider, AGNP-BC  [facility] [address] [place], [place] [address]  Phone [phone] Fax [phone] Note: This document was prepared with digital dictation and possible smart phrase technology. Any transcriptional errors that result from this process are unintentional.

## 2021-03-29 NOTE — Patient Instructions (Signed)
Continue aspirin 81 mg daily  and atorvastatin 40mg  daily  for secondary stroke prevention  Please stop plavix at this time as no longer needed  Continue to follow up with PCP regarding cholesterol and blood pressure management  Maintain strict control of hypertension with blood pressure goal below 130/90 and cholesterol with LDL cholesterol (bad cholesterol) goal below 70 mg/dL.   Please let me know if you would like to do any type of therapy - either physical therapy for balance or speech/cognitive therapy for occasional word finding difficulty and short term memory loss - overall you are recovering very well and should continue to see progress!       Followup in the future with me in 4-5 months or call earlier if needed       Thank you for coming to see [place] at [facility]. I hope we have been able to provide you high quality care today.  You may receive a patient satisfaction survey over the next few weeks. We would appreciate your feedback and comments so that we may continue to improve ourselves and the health of our patients.

## 2021-03-30 NOTE — Progress Notes (Signed)
I agree with the above plan 

## 2021-04-25 ENCOUNTER — Encounter: Payer: Self-pay | Admitting: Hematology and Oncology

## 2021-04-25 DIAGNOSIS — Z9189 Other specified personal risk factors, not elsewhere classified: Secondary | ICD-10-CM | POA: Insufficient documentation

## 2021-04-25 HISTORY — DX: Other specified personal risk factors, not elsewhere classified: Z91.89

## 2021-04-25 NOTE — Assessment & Plan Note (Addendum)
Elevated risk of breast cancer over 29% due to family history of malignancy. Breast exam remains normal.  We recommended annual MRI breasts in addition to annual mammography, but this was not done when she was last seen due to new severe siatica. She is overdue for mammogram, so I will get that scheduled. We will hopefully do an MRI breasts in 6 months.

## 2021-04-25 NOTE — Assessment & Plan Note (Addendum)
Small lymphocytic low-grade lymphoma, diagnosed in June 2017, for which she has been on observation. She has had borderline leukopenia, which fluctuates up and down. B12 and folate levels were previously normal. Her blood counts are stable today. She has stable bilateral cervical and inguinal lymphadenopathy.

## 2021-04-25 NOTE — Progress Notes (Addendum)
[facility]  [address] 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 She has had borderline leukopenia, which fluctuates up and down. B12 and folate levels were previously normal. Her blood counts are stable today. She has stable bilateral cervical and inguinal lymphadenopathy.  Increased risk of breast cancer Elevated risk of breast cancer over 29% due to family history of malignancy. Breast exam remains normal.  We recommended annual MRI breasts in addition to annual mammography, but this was not done when she was last seen due to new severe siatica. She is overdue for mammogram, so I will get that scheduled. We will hopefully do an MRI breasts in 6 months.  Nausea without vomiting Chronic nausea for several weeks without vomiting. She states the nausea seems to be relieved with food. She denies diarrhea or constipation. There are no findings on examination. She attributes this to weaning off of gabapentin, but she followed the directions for weaning and she has been off gabapentin for about 2 weeks. Her husband asks if Zofran would help. I will give her Zofran 4 mg three times daily as needed. I also recommended she try a proton-pump inhibitor at least temporarily. She states she has OTC Nexium at home to try. I advised her to follow up with her primary care provider in 1 to 2 weeks.  Abnormal transaminases She had mild elevation of the transaminases at her visit in September. This is worsening, but the dose of atorvastatin has been increased, so this is most likely the culprit.   The patient understands the plans discussed today and is in agreement with them.  She knows to contact our  office if she develops concerns prior to her next appointment.      Marvia Pickles, PA-C  [facility] AT [facility] [address] [place] [address] Dept: [phone] Dept Fax: [phone]   Orders Placed This Encounter  Procedures   MM Digital Screening    Standing Status:   Future    Standing Expiration Date:   04/26/2022    Order Specific Question:   Reason for Exam (SYMPTOM  OR DIAGNOSIS REQUIRED)    Answer:   screening for breast cancer    Order Specific Question:   Preferred imaging location?    Answer:   External   CBC and differential    This external order was created through the Results Console.   CBC    This external order was created through the Results Console.   CBC    This order was created through External Result Entry   CBC    This order was created through External Result Entry   CBC with Differential (Cancer Center Only)    Standing Status:   Future    Standing Expiration Date:   04/26/2022   CMP (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  Staging CT chest revealed adenopathy of the bilateral neck measuring up to 18 mm in diameter with left subclavian, bilateral axillary and subpectoral adenopathy, but no mediastinal nodes.  CT abdomen was  negative.  CT pelvis revealed bilateral external iliac nodes up to 11 mm in diameter.  She had B symptoms with severe night sweats and weight loss.  She has been on  observation only.  Due to her family history of breast cancer, she underwent testing for hereditary breast and ovarian cancer with the [employer] myRisk Hereditary Cancer Gene panel test.  This did not  reveal any clinically significant mutation.  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  Her [facility] breast cancer risk assessment showed her lifetime risk of breast cancer to be 29.7%, so annual breast MRI, in addition to mammogram is recommended.  Mammogram and MRI breast done in August 2019 did not reveal any evidence of malignancy.  She did go for a second opinion to [facility] regarding her lymphoma and they concurred with the approach of watchful waiting.    She presented to the emergency room in mid April 2021 due to increased urinary frequency and discomfort.  CT imaging revealed left sided obstructive uropathy with 3 mm calculus in the distal left ureter just proximal to the ureterovesical unction causing moderate hydroureteronephrosis and perinephric stranding.  Left greater than right iliac and pelvic lymphadenopathy, slightly greater than on 10/26/18, consistent with known history of lymphoma.  We did not repeat a scan in June as scheduled.  Annual screening bilateral mammogram from August 2021 was clear.     CT neck from May 2022 revealed multiple lymph nodes in the neck bilaterally, with mild progression of lymph nodes on the right. There has been more significant progression of left level 4 and left supraclavicular lymph nodes compared to the prior study. Largest lymph node in the left supraclavicular region measures 35 x 17 mm. Findings were compatible with lymphoma. CT chest/abdomen/pelvis revealed no substantial interval change in exam. Bilateral supraclavicular, subpectoral, axillary, retroperitoneal, and pelvic lymphadenopathy was similar to prior. There were no definite findings of progression.  At her visit in September, the lymphoma remained stable. She continued to have sciatic pain unrelieved with gabapentin 800 mg TID. She had received 2 steroid injections of the spine with improvement and was scheduled for another. Due to this, MRI breast was not  scheduled.     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[place])  11/11/2015 Cancer Staging   Staging form: Hodgkin and Non-Hodgkin Lymphoma, AJCC 8th Edition - Clinical stage from 11/11/2015: Stage III (Small lymphocytic leukemia) - Signed by Derwood Kaplan, MD on 01/28/2021 Histopathologic type: Malignant lymphoma, small B lymphocytic, NOS (see also M-9823/3) Stage prefix: Initial diagnosis Diagnostic confirmation: Positive histology PLUS positive immunophenotyping and/or positive genetic studies Specimen type: Core Needle Biopsy Staged by: Managing physician Stage used in treatment planning: Yes National guidelines used in treatment planning: Yes Type of national guideline used in treatment planning: NCCN Staging comments: Watchful waiting    06/20/2020 Initial Diagnosis   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)   Chronic lymphocytic leukemia (CLL), B-cell ([place])  10/31/2015 Initial Diagnosis   Chronic lymphocytic leukemia (CLL), B-cell ([place])   11/11/2015 Cancer Staging   Staging form: Chronic Lymphocytic Leukemia / Small Lymphocytic Lymphoma, AJCC 8th Edition - Clinical stage from 11/11/2015: Modified Rai Stage III (Modified Rai risk: High, Binet: Stage B, [place]: Stage III,  Lymphocytosis: Absent, Adenopathy: Present, Organomegaly: Absent, Anemia: Absent, Thrombocytopenia: Absent) - Signed by Derwood Kaplan, MD on 04/05/2021 Histopathologic type: B-cell lymphocytic leukemia/small lymphocytic lymphoma (see also M-9670/3) Stage prefix: Initial diagnosis Stage used in treatment planning: Yes National guidelines used in treatment planning: Yes Type of national guideline used in treatment planning: NCCN       INTERVAL HISTORY:  Leathia is here today for repeat clinical assessment. 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 She was initially on clopidogrel, but is now on aspirin 81 mg daily. She reports residual  paresthesias of the left hand in the 3rd through 5th fingers and issues with balance since the  strokes. Her atorvastatin was increase to 40 mg daily after the stroke. She had another epidural steroid injection for the siatic pain last week. She experienced a headache after the procedure, which is common. She reports persistent right hip pain. She is to start physical therapy for her pain, as well as balance issues, next week. She states she has been more fatigued. She reports chronic nausea for about 2 weeks, which she attributes to weaning off of gabapentin. She states eating seems to help the nausea. She denies vomiting, diarrhea or constipation. She had a colonoscopy in May with removal of a small polyp. Follow up in 5 years was recommended.  She denies fevers or chills. She denies pain. Her appetite is good. Her weight has been stable.  She is overdue for annual screening mammogram.  REVIEW OF SYSTEMS:  Review of Systems  Constitutional:  Positive for fatigue. Negative for appetite change, chills, fever and unexpected weight change.  HENT:   Negative for lump/mass, mouth sores and sore throat.   Respiratory:  Negative for cough and shortness of breath.   Cardiovascular:  Negative for chest pain and leg swelling.  Gastrointestinal:  Positive for nausea. Negative for abdominal pain, constipation, diarrhea and vomiting.  Genitourinary:  Negative for difficulty urinating, dysuria, frequency and hematuria.   Musculoskeletal:  Negative for arthralgias, back pain and myalgias.  Skin:  Negative for rash.  Neurological:  Negative for dizziness and headaches.  Hematological:  Negative for adenopathy. Does not bruise/bleed easily.  Psychiatric/Behavioral:  Negative for depression and sleep disturbance. The patient is not nervous/anxious.     VITALS:  Blood pressure 119/71, pulse 100, temperature 97.7 F (36.5 C), temperature source Oral, resp. rate 18, weight 130 lb 8 oz (59.2 kg), SpO2 100 %.  Wt  Readings from Last 3 Encounters:  04/26/21 130 lb 8 oz (59.2 kg)  03/29/21 130 lb (59 kg)  02/08/21 135 lb (61.2 kg)    Body mass index is 23.87 kg/m.  Performance status (ECOG): 1 - Symptomatic but completely ambulatory  PHYSICAL EXAM:  Physical Exam Vitals and nursing note reviewed.  Constitutional:      General: She is not in acute distress.    Appearance: Normal appearance.  HENT:     Head: Normocephalic and atraumatic.     Mouth/Throat:     Mouth: Mucous membranes are moist.     Pharynx: Oropharynx is clear. No oropharyngeal exudate or posterior oropharyngeal erythema.  Eyes:     General: No scleral icterus.    Extraocular Movements: Extraocular movements intact.     Conjunctiva/sclera: Conjunctivae normal.     Pupils: Pupils are equal, round, and reactive to light.  Cardiovascular:     Rate and Rhythm: Normal rate and regular rhythm.     Heart sounds: Normal heart sounds. No murmur heard.  No friction rub. No gallop.  Pulmonary:     Effort: Pulmonary effort is normal.     Breath sounds: Normal breath sounds. No wheezing, rhonchi or rales.  Chest:  Breasts:    Right: Normal. No swelling, bleeding, inverted nipple, mass, nipple discharge, skin change or tenderness.     Left: Normal. No swelling, bleeding, inverted nipple, mass, nipple discharge, skin change or tenderness.  Abdominal:     General: There is no distension.     Palpations: Abdomen is soft. There is no hepatomegaly, splenomegaly or mass.     Tenderness: There is no abdominal tenderness.  Musculoskeletal:        General: Normal range of motion.     Cervical back: Normal range of motion and neck supple. No tenderness.     Right lower leg: No edema.     Left lower leg: No edema.  Lymphadenopathy:     Cervical: Cervical adenopathy (bilateral, largest about 1.5 cm left posterior neck) present.     Upper Body:     Right upper body: No supraclavicular or axillary adenopathy.     Left upper body: No  supraclavicular or axillary adenopathy.     Lower Body: Right inguinal adenopathy (largest about 1 cm) present. Left inguinal adenopathy (largest about 1 cm) present.  Skin:    General: Skin is warm and dry.     Coloration: Skin is not jaundiced.     Findings: No rash.  Neurological:     Mental Status: She is alert and oriented to person, place, and time.     Cranial Nerves: No cranial nerve deficit.  Psychiatric:        Mood and Affect: Mood normal.        Behavior: Behavior normal.        Thought Content: Thought content normal.    LABS:   CBC Latest Ref Rng & Units 04/26/2021 02/13/2021 02/12/2021  WBC - 4.7 4.0 4.3  Hemoglobin 12.0 - 16.0 13.2 10.5(L) 11.7(L)  Hematocrit 36 - 46 40 32.7(L) 35.2(L)  Platelets 150 - 399 225 200 206   CMP Latest Ref Rng & Units 04/26/2021 02/13/2021 02/12/2021  Glucose 70 - 99 mg/dL - 114(H) 113(H)  BUN 4 - _0 Creatinine 0.5 - 1.1 1.1 0.89 1.12(H)  Sodium 137 - 147 141 139 136  Potassium 3.4 - 5.3 4.1 4.8 4.1  Chloride 99 - 108 105 103 101  CO2 13 - 22 29(A) 29 25  Calcium 8.7 - 10.7 9.3 8.9 8.9  Total Protein 6.5 - 8.1 g/dL - 5.6(L) 5.9(L)  Total Bilirubin 0.3 - 1.2 mg/dL - 0.3 0.7  Alkaline Phos 25 - 125 80 46 50  AST 13 - 35 61(A) 27 37  ALT 7 - 35 81(A) 35 36     No results found for: CEA1 / No results found for: CEA1 No results found for: PSA1 No results found for: PQA449 No results found for: CAN125  No results found for: TOTALPROTELP, ALBUMINELP, A1GS, A2GS, BETS, BETA2SER, GAMS, MSPIKE, SPEI No results found for: TIBC, FERRITIN, IRONPCTSAT Lab Results  Component Value Date   LDH 147 04/26/2021   LDH 166 01/25/2021   LDH 458 10/11/2020    STUDIES:  No results found.    HISTORY:   Past Medical History:  Diagnosis Date   Allergy    seasonal   Arthritis    Bipolar 1 disorder ([place])    Cancer ([place])    non hodgkins lymphoma  Depression    GERD (gastroesophageal reflux disease)    History of degenerative disc  disease    Hyperlipidemia    Increased risk of breast cancer 04/25/2021   Obstructive sleep apnea    Osteopenia    Scoliosis    Thyroid disease    hypothyroidism   Vitamin D deficiency     Past Surgical History:  Procedure Laterality Date   BREAST BIOPSY     BUBBLE STUDY  02/13/2021   Procedure: BUBBLE STUDY;  Surgeon: Skeet Latch, MD;  Location: [facility];  Service: Cardiovascular;;   CARPAL TUNNEL RELEASE Bilateral 2002   CESAREAN SECTION     x2   COLONOSCOPY  07/26/2008   Melanosis coli. Small internal hemorrhoids.    ENDOSCOPIC PLANTAR FASCIOTOMY     ESOPHAGOGASTRODUODENOSCOPY  03/17/2013   Mild gastritis. Status post esophageal dilatation.   LYMPH NODE BIOPSY     TEE WITHOUT CARDIOVERSION N/A 02/13/2021   Procedure: TRANSESOPHAGEAL ECHOCARDIOGRAM (TEE);  Surgeon: Skeet Latch, MD;  Location[facility] ENDOSCOPY;  Service: Cardiovascular;  Laterality: N/A;   WISDOM TOOTH EXTRACTION      Family History  Problem Relation Age of Onset   Prostate cancer Father 59   Melanoma Father 31   High blood pressure Father    Breast cancer Maternal Aunt    Multiple myeloma Maternal Aunt    Breast cancer Maternal Aunt    Colon cancer Neg Hx    Colon polyps Neg Hx    Esophageal cancer Neg Hx    Rectal cancer Neg Hx    Stomach cancer Neg Hx     Social History:  reports that she has never smoked. She has never used smokeless tobacco. She reports that she does not currently use alcohol. She reports that she does not use drugs.The patient is accompanied by her husband today.  Allergies: No Known Allergies  Current Medications: Current Outpatient Medications  Medication Sig Dispense Refill   ondansetron (ZOFRAN) 4 MG tablet Take 1 tablet (4 mg total) by mouth every 8 (eight) hours as needed for nausea or vomiting. 20 tablet 0   ascorbic acid (VITAMIN C) 500 MG tablet Take 1 tablet by mouth daily.     aspirin EC 81 MG tablet Take 1 tablet (81 mg total) by mouth daily. 30  tablet 2   atorvastatin (LIPITOR) 40 MG tablet Take 1 tablet (40 mg total) by mouth daily. 30 tablet 0   buPROPion (WELLBUTRIN) 75 MG tablet Take 75 mg by mouth 2 (two) times daily.     Calcium Carb-Cholecalciferol (CALCIUM CARBONATE-VITAMIN D3 PO) Take 1 tablet by mouth daily.     chlorpheniramine (CHLOR-TRIMETON) 4 MG tablet Take 4 mg by mouth daily as needed for allergies.     ELDERBERRY PO Take 1 tablet by mouth daily.     Esomeprazole Magnesium (NEXIUM 24HR PO) Take 1 capsule by mouth at bedtime.     FLUoxetine (PROZAC) 20 MG capsule Take 60 mg by mouth daily.     levothyroxine (SYNTHROID) 25 MCG tablet Take 25 mcg by mouth every morning.     Multiple Vitamin (MULTIVITAMIN ADULT PO) Take 1 capsule by mouth daily.     Omega 3 1000 MG CAPS Take 1,000 mg by mouth daily.     Probiotic Product (PROBIOTIC BLEND PO) Take 1 capsule by mouth daily.     QUEtiapine (SEROQUEL) 100 MG tablet Take 100 mg by mouth daily as needed (depression).     QUEtiapine (SEROQUEL) 300 MG tablet Take 300 mg  by mouth at bedtime.     ramipril (ALTACE) 1.25 MG capsule Take 1.25 mg by mouth daily.     zinc gluconate 50 MG tablet Take 50 mg by mouth daily.     No current facility-administered medications for this visit.

## 2021-04-26 ENCOUNTER — Inpatient Hospital Stay: Payer: Managed Care, Other (non HMO) | Attending: Oncology

## 2021-04-26 ENCOUNTER — Encounter: Payer: Self-pay | Admitting: Hematology and Oncology

## 2021-04-26 ENCOUNTER — Inpatient Hospital Stay: Payer: Managed Care, Other (non HMO) | Admitting: Hematology and Oncology

## 2021-04-26 ENCOUNTER — Telehealth: Payer: Self-pay | Admitting: Hematology and Oncology

## 2021-04-26 ENCOUNTER — Other Ambulatory Visit: Payer: Self-pay

## 2021-04-26 VITALS — BP 119/71 | HR 100 | Temp 97.7°F | Resp 18 | Wt 130.5 lb

## 2021-04-26 DIAGNOSIS — R7401 Elevation of levels of liver transaminase levels: Secondary | ICD-10-CM | POA: Diagnosis not present

## 2021-04-26 DIAGNOSIS — R110 Nausea: Secondary | ICD-10-CM | POA: Diagnosis not present

## 2021-04-26 DIAGNOSIS — R590 Localized enlarged lymph nodes: Secondary | ICD-10-CM | POA: Diagnosis not present

## 2021-04-26 DIAGNOSIS — Z9189 Other specified personal risk factors, not elsewhere classified: Secondary | ICD-10-CM | POA: Diagnosis not present

## 2021-04-26 DIAGNOSIS — C8300 Small cell B-cell lymphoma, unspecified site: Secondary | ICD-10-CM | POA: Insufficient documentation

## 2021-04-26 DIAGNOSIS — R748 Abnormal levels of other serum enzymes: Secondary | ICD-10-CM

## 2021-04-26 DIAGNOSIS — Z1231 Encounter for screening mammogram for malignant neoplasm of breast: Secondary | ICD-10-CM | POA: Diagnosis not present

## 2021-04-26 DIAGNOSIS — C8302 Small cell B-cell lymphoma, intrathoracic lymph nodes: Secondary | ICD-10-CM | POA: Diagnosis not present

## 2021-04-26 DIAGNOSIS — C9110 Chronic lymphocytic leukemia of B-cell type not having achieved remission: Secondary | ICD-10-CM

## 2021-04-26 DIAGNOSIS — Z79899 Other long term (current) drug therapy: Secondary | ICD-10-CM | POA: Insufficient documentation

## 2021-04-26 LAB — HEPATIC FUNCTION PANEL
ALT: 81 — AB (ref 7–35)
AST: 61 — AB (ref 13–35)
Alkaline Phosphatase: 80 (ref 25–125)
Bilirubin, Total: 0.5

## 2021-04-26 LAB — CBC
MCV: 101 — AB (ref 81–99)
RBC: 3.92 (ref 3.87–5.11)

## 2021-04-26 LAB — BASIC METABOLIC PANEL
BUN: 16 (ref 4–21)
CO2: 29 — AB (ref 13–22)
Chloride: 105 (ref 99–108)
Creatinine: 1.1 (ref 0.5–1.1)
Glucose: 109
Potassium: 4.1 (ref 3.4–5.3)
Sodium: 141 (ref 137–147)

## 2021-04-26 LAB — COMPREHENSIVE METABOLIC PANEL
Albumin: 4.2 (ref 3.5–5.0)
Calcium: 9.3 (ref 8.7–10.7)

## 2021-04-26 LAB — CBC AND DIFFERENTIAL
HCT: 40 (ref 36–46)
Hemoglobin: 13.2 (ref 12.0–16.0)
Neutrophils Absolute: 2.3
Platelets: 225 (ref 150–399)
WBC: 4.7

## 2021-04-26 LAB — LACTATE DEHYDROGENASE: LDH: 147 U/L (ref 98–192)

## 2021-04-26 MED ORDER — ONDANSETRON HCL 4 MG PO TABS: 4.0000 mg | ORAL_TABLET | Freq: Three times a day (TID) | ORAL | 0 refills | Status: DC | PRN

## 2021-04-26 NOTE — Telephone Encounter (Signed)
Per 12/1 los next appt scheduled and confirmed with patient

## 2021-04-26 NOTE — Assessment & Plan Note (Deleted)
She has stable bilateral cervical lymphadenopathy.  Her blood counts remain stable. We will continue observation. We will plan to see her back in 3 months with a CBC and comprehensive metabolic panel.

## 2021-04-26 NOTE — Assessment & Plan Note (Addendum)
Chronic nausea for several weeks without vomiting. She states the nausea seems to be relieved with food. She denies diarrhea or constipation. There are no findings on examination. She attributes this to weaning off of gabapentin, but she followed the directions for weaning and she has been off gabapentin for about 2 weeks. Her husband asks if Zofran would help. I will give her Zofran 4 mg three times daily as needed. I also recommended she try a proton-pump inhibitor at least temporarily. She states she has OTC Nexium at home to try. I advised her to follow up with her primary care provider in 1 to 2 weeks.

## 2021-04-27 ENCOUNTER — Encounter: Payer: Self-pay | Admitting: Hematology and Oncology

## 2021-04-27 DIAGNOSIS — R748 Abnormal levels of other serum enzymes: Secondary | ICD-10-CM | POA: Insufficient documentation

## 2021-04-27 NOTE — Assessment & Plan Note (Signed)
She had mild elevation of the transaminases at her visit in September. This is worsening, but the dose of atorvastatin has been increased, so this is most likely the culprit.

## 2021-05-07 ENCOUNTER — Telehealth: Payer: Self-pay

## 2021-05-07 NOTE — Telephone Encounter (Signed)
Labs faxed on12/11/2020

## 2021-05-07 NOTE — Telephone Encounter (Signed)
-----   Message from Marvia Pickles, PA-C sent at 04/27/2021  3:37 PM EST ----- Please fax labs from September to present [facility] to Dr. Ernestene Kiel. Thanks

## 2021-05-30 ENCOUNTER — Telehealth: Payer: Self-pay | Admitting: Hematology and Oncology

## 2021-05-30 NOTE — Telephone Encounter (Signed)
05/30/21 Lft vm

## 2021-06-18 ENCOUNTER — Telehealth: Payer: Self-pay

## 2021-06-18 NOTE — Telephone Encounter (Signed)
-----   Message from Derwood Kaplan, MD sent at 06/15/2021  2:21 PM EST ----- Regarding: call Good news on the second mammogram, we can just repeat in 1 year.  I will see her in March

## 2021-06-18 NOTE — Telephone Encounter (Signed)
Per Langley Gauss in scheduling. Patient returned call. Patient informed Langley Gauss that she already was aware of her mammo results/

## 2021-06-21 ENCOUNTER — Encounter: Payer: Self-pay | Admitting: Oncology

## 2021-07-16 ENCOUNTER — Telehealth: Payer: Self-pay

## 2021-07-16 NOTE — Telephone Encounter (Signed)
Patient called to let us know that she had an MRI of her right hip last week. Follow up with the [facility] they states that she needed to follow up with [place] due to the MRI showing enlarged lymph nodes in her right hip area. Follow up appointment is scheduled for July 25, 2021. Patient wanted to know if she needed to follow up sooner.

## 2021-07-19 NOTE — Progress Notes (Signed)
[facility]  [address] [place],    [id number] [phone]  Clinic Day:  07/20/21  Referring physician: Ernestene Kiel, MD  This document serves as a record of services personally performed by Hosie Poisson, MD. It was created on their behalf by Curry,Lauren E, a trained medical scribe. The creation of this record is based on the scribe's personal observations and the provider's statements to them.  ASSESSMENT & PLAN:   Assessment & Plan: 1. Small lymphocytic low-grade lymphoma, diagnosed in June 2017.  She remains fairly stable on observation only. She has had stable bilateral cervical and inguinal lymphadenopathy.  However, recent MRI imaging from February 2023 has revealed extensive inguinal and retroperitoneal lymphadenopathy. Her physical exam reveals a significant change in her cervical and supraclavicular adenopathy. However, this is largely asymptomatic other than anemia. We will now plan to obtain PET imaging.   2. Anemia, macrocytic. We will repeat B12 and folate levels.   3. Elevated risk of breast cancer over 29% due to personal and family history.  We have recommended annual MRI breasts in addition to annual mammography, but we will hold off for now in view of her other circumstances.  Recent MRI imaging has revealed extensive inguinal and retroperitoneal lymphadenopathy. She also has increased palpable adenopathy. We will now plan to obtain PET imaging for further evaluation. This may represent progression of her small lymphocytic lymphoma, however, I cannot rule out transformation. Potential treatment options were briefly reviewed today. Once we receive the results, we will bring her back for discussion and treatment planning. We will have a conversation about whether treatment is necessary at this time since it is largely asymptomatic other than the anemia. Both her and her husband verbalize understanding of and agreement to the  plans discussed today. They know to call the office should any new questions or concerns arise.   I provided 20 minutes of face-to-face time during this this encounter and > 50% was spent counseling as documented under my assessment and plan.    Derwood Kaplan, MD  [facility] [address] 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  Staging CT chest revealed adenopathy of the bilateral neck measuring up to 18 mm in diameter with left subclavian, bilateral axillary and subpectoral adenopathy, but no mediastinal nodes.  CT abdomen was  negative.  CT pelvis revealed bilateral external iliac nodes up to 11 mm in diameter.  She had B symptoms with severe night sweats and weight loss.  She has been on  observation only.  Due to her family history of breast cancer, she underwent testing for hereditary breast and ovarian cancer with the [employer] myRisk Hereditary Cancer Gene panel test.  This did not reveal any clinically significant mutation.  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  Her [facility] breast cancer risk assessment showed her lifetime risk of breast cancer to be 29.7%, so annual breast MRI, in addition to mammogram is recommended.  Mammogram and MRI breast done in August 2019 did not reveal any evidence of malignancy.  She did  go for a second opinion to [facility] regarding her lymphoma and they concurred with the approach of watchful waiting.    She presented to the emergency room in mid April 2021 due to increased urinary frequency and discomfort.   CT imaging revealed left sided obstructive uropathy with 3 mm calculus in the distal left ureter just proximal to the ureterovesical unction causing moderate hydroureteronephrosis and perinephric stranding.  Left greater than right iliac and pelvic lymphadenopathy, slightly greater than on 10/26/18, consistent with known history of lymphoma.  We did not repeat a scan in June as scheduled.  Annual screening bilateral mammogram from August 2021 was clear.     CT neck from May 2022 revealed multiple lymph nodes in the neck bilaterally, with mild progression of lymph nodes on the right. There has been more significant progression of left level 4 and left supraclavicular lymph nodes compared to the prior study. Largest lymph node in the left supraclavicular region measures 35 x 17 mm. Findings were compatible with lymphoma. CT chest/abdomen/pelvis revealed no substantial interval change in exam. Bilateral supraclavicular, subpectoral, axillary, retroperitoneal, and pelvic lymphadenopathy was similar to prior. There were no definite findings of progression.  At her visit in September, the lymphoma remained stable. She continued to have sciatic pain unrelieved with gabapentin 800 mg TID. She had received 2 steroid injections of the spine with improvement and was scheduled for another. Due to this, MRI breast was not scheduled.     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[facility])  11/11/2015 Cancer Staging   Staging form: Hodgkin and Non-Hodgkin Lymphoma, AJCC 8th Edition - Clinical stage from 11/11/2015: Stage III (Small lymphocytic leukemia) - Signed by Derwood Kaplan, MD on 01/28/2021 Histopathologic type: Malignant lymphoma, small B lymphocytic, NOS (see also M-9823/3) Stage prefix: Initial diagnosis Diagnostic confirmation: Positive histology PLUS positive immunophenotyping and/or positive genetic studies Specimen type: Core Needle Biopsy Staged by: Managing  physician Stage used in treatment planning: Yes National guidelines used in treatment planning: Yes Type of national guideline used in treatment planning: NCCN Staging comments: Watchful waiting    06/20/2020 Initial Diagnosis   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)   Chronic lymphocytic leukemia (CLL), B-cell ([facility])  10/31/2015 Initial Diagnosis   Chronic lymphocytic leukemia (CLL), B-cell ([facility])   11/11/2015 Cancer Staging   Staging form: Chronic Lymphocytic Leukemia / Small Lymphocytic Lymphoma, AJCC 8th Edition - Clinical stage from 11/11/2015: Modified Rai Stage III (Modified Rai risk: High, Binet: Stage B, Lugano: Stage III, Lymphocytosis: Absent, Adenopathy: Present, Organomegaly: Absent, Anemia: Absent, Thrombocytopenia: Absent) - Signed by Derwood Kaplan, MD on 04/05/2021 Histopathologic type: B-cell lymphocytic leukemia/small lymphocytic lymphoma (see also M-9670/3) Stage prefix: Initial diagnosis Stage used in treatment planning: Yes National guidelines used in treatment planning: Yes Type of national guideline used in treatment planning: NCCN       INTERVAL HISTORY:  Cleopatra is here for follow up due to abnormal imaging results. Screening bilateral mammogram from January revealed a possible asymmetry in the left breast. Diagnostic unilateral left mammogram revealed no evidence of malignancy. Repeat bilateral imaging in 1 year was recommended. She has been struggling with bilateral hip pain, right worse than left. She has been receiving cortisone injections of the spine and hip with interval relief. MRI of the right hip revealed extensive inguinal and retroperitoneal lymphadenopathy. Given the number and size of lymph nodes this may represent a lymphoproliferative disorder such as lymphoma. Metastatic disease or reactive lymph  nodes not excluded. High-grade tears of the right gluteus numbness greater than medius with background tendinosis and trochanteric  bursitis. Bilateral hamstring origin tendinosis with mild partial tearing. Mild bilateral hip osteoarthritis. Right sided labral tear. She states that she has chronic palpable lymph nodes of the left neck and supraclavicular area. She does report night sweats, increased since her injections, and occasional nausea. Hemoglobin has decreased from 13.2 to 11.5, and white count and platelets are normal. Chemistries are unremarkable except for a BUN of 30. Her  appetite is good, and she has lost 1 1/2 pound since her last visit.  She denies fever, chills or other signs of infection.  She denies nausea, vomiting, bowel issues, or abdominal pain.  She denies sore throat, cough, dyspnea, or chest pain.  REVIEW OF SYSTEMS:  Review of Systems  Constitutional: Negative.  Negative for appetite change, chills, fatigue, fever and unexpected weight change.       Night sweats  HENT:  Negative.    Eyes: Negative.   Respiratory: Negative.  Negative for chest tightness, cough, hemoptysis, shortness of breath and wheezing.   Cardiovascular: Negative.  Negative for chest pain, leg swelling and palpitations.  Gastrointestinal:  Positive for nausea (occasional). Negative for abdominal distention, abdominal pain, blood in stool, constipation, diarrhea and vomiting.  Endocrine: Negative.   Genitourinary: Negative.  Negative for difficulty urinating, dysuria, frequency and hematuria.   Musculoskeletal:  Positive for arthralgias (and pain of the bilateral hips). Negative for back pain, flank pain, gait problem and myalgias.  Skin: Negative.   Neurological: Negative.  Negative for dizziness, extremity weakness, gait problem, headaches, light-headedness, numbness, seizures and speech difficulty.  Hematological: Negative.   Psychiatric/Behavioral: Negative.  Negative for depression and sleep disturbance. The patient is not nervous/anxious.     VITALS:  Blood pressure 139/79, pulse 88, temperature 98.6 F (37 C), temperature  source Oral, resp. rate 18, height 5' (1.524 m), weight 129 lb 4.8 oz (58.7 kg), SpO2 97 %.  Wt Readings from Last 3 Encounters:  07/25/21 127 lb 8 oz (57.8 kg)  07/20/21 129 lb 4.8 oz (58.7 kg)  04/26/21 130 lb 8 oz (59.2 kg)    Body mass index is 25.25 kg/m.  Performance status (ECOG): 1 - Symptomatic but completely ambulatory  PHYSICAL EXAM:  Physical Exam Constitutional:      General: She is not in acute distress.    Appearance: Normal appearance. She is normal weight.  HENT:     Head: Normocephalic and atraumatic.  Eyes:     General: No scleral icterus.    Extraocular Movements: Extraocular movements intact.     Conjunctiva/sclera: Conjunctivae normal.     Pupils: Pupils are equal, round, and reactive to light.  Cardiovascular:     Rate and Rhythm: Normal rate and regular rhythm.     Pulses: Normal pulses.     Heart sounds: Normal heart sounds. No murmur heard.   No friction rub. No gallop.  Pulmonary:     Effort: Pulmonary effort is normal. No respiratory distress.     Breath sounds: Normal breath sounds.  Abdominal:     General: Bowel sounds are normal. There is no distension.     Palpations: Abdomen is soft. There is no hepatomegaly, splenomegaly or mass.     Tenderness: There is no abdominal tenderness.  Musculoskeletal:        General: Normal range of motion.     Cervical back: Normal range of motion and neck supple.  Right lower leg: No edema.     Left lower leg: No edema.  Lymphadenopathy:     Cervical: Cervical adenopathy (there are a few 1 cm lymph nodes of the right neck; there are also nodes in the left neck with multiple 2 cm nodes.) present.     Upper Body:     Right upper body: Supraclavicular adenopathy and axillary adenopathy (a few small lymph nodes) present.     Left upper body: Supraclavicular adenopathy (conglomerate of nodes in the left supraclavicular fossa.) present.     Lower Body: Right inguinal adenopathy (less prominent) present. Left  inguinal adenopathy (ridge of adenopathy right above the inguinal line) present.  Skin:    General: Skin is warm and dry.  Neurological:     General: No focal deficit present.     Mental Status: She is alert and oriented to person, place, and time. Mental status is at baseline.  Psychiatric:        Mood and Affect: Mood normal.        Behavior: Behavior normal.        Thought Content: Thought content normal.        Judgment: Judgment normal.    LABS:   CBC Latest Ref Rng & Units 07/20/2021 04/26/2021 02/13/2021  WBC - 5.6 4.7 4.0  Hemoglobin 12.0 - 16.0 11.5(A) 13.2 10.5(L)  Hematocrit 36 - 46 34(A) 40 32.7(L)  Platelets 150 - 399 232 225 200   CMP Latest Ref Rng & Units 07/20/2021 04/26/2021 02/13/2021  Glucose 70 - 99 mg/dL - - 114(H)  BUN 4 - 21 30(A) 16 16  Creatinine 0.5 - 1.1 1.0 1.1 0.89  Sodium 137 - 147 139 141 139  Potassium 3.4 - 5.3 4.5 4.1 4.8  Chloride 99 - 108 104 105 103  CO2 13 - 22 28(A) 29(A) 29  Calcium 8.7 - 10.7 8.7 9.3 8.9  Total Protein 6.5 - 8.1 g/dL - - 5.6(L)  Total Bilirubin 0.3 - 1.2 mg/dL - - 0.3  Alkaline Phos 25 - 125 69 80 46  AST 13 - 35 33 61(A) 27  ALT 7 - 35 30 81(A) 35    Lab Results  Component Value Date   LDH 142 07/20/2021   LDH 147 04/26/2021   LDH 166 01/25/2021    STUDIES:  No results found.    EXAM: 05/29/2021 DIGITAL SCREENING BILATERAL MAMMOGRAM WITH TOMOSYNTHESIS AND CAD   TECHNIQUE:  Bilateral screening digital craniocaudal and mediolateral oblique  mammograms were obtained. Bilateral screening digital breast  tomosynthesis was performed. The images were evaluated with  computer-aided detection.   COMPARISON: Previous exam(s).   ACR Breast Density Category c: The breast tissue is heterogeneously  dense, which may obscure small masses.   FINDINGS:  In the left breast, a possible asymmetry warrants further  evaluation. In the right breast, no findings suspicious for  malignancy.   IMPRESSION:  Further  evaluation is suggested for possible asymmetry in the left  breast.     EXAM: 06/14/2021 DIGITAL DIAGNOSTIC UNILATERAL LEFT MAMMOGRAM WITH TOMOSYNTHESIS AND  CAD   TECHNIQUE:  Left digital diagnostic mammography and breast tomosynthesis was  performed. The images were evaluated with computer-aided detection.   COMPARISON: Previous exam(s).   ACR Breast Density Category c: The breast tissue is heterogeneously  dense, which may obscure small masses.   FINDINGS:  Spot-compression CC view of the area of concern and full field  mediolateral and medially rolled CC views were obtained.  The  asymmetry in the inner breast at middle depth questioned on  screening mammography disperses with compression, indicating  overlapping dense fibroglandular tissue. There is no underlying mass  or architectural distortion. This is confirmed on the full field  mediolateral and medially rolled CC views.  No findings suspicious for malignancy.   IMPRESSION:  No mammographic evidence of malignancy involving the LEFT breast.    MRI HIP RIGHT WO IV CONTRASTl 07/12/2021 COMPARISON:  None  INDICATION: Unilateral primary osteoarthritis, right hip    TECHNIQUE: Multiplanar, multisequence MRI of the hip was performed without contrast. This protocol includes high resolution images of the symptomatic hip and large field-of-view coronal images of the pelvis, including both hips.   FINDINGS:    OSSEOUS/JOINTS:  No acute fractures or dislocations.  No evidence of avascular necrosis.  No significant hip joint effusions.  Mild bilateral hip joint space narrowing.  Linear focus of increased T2 signal traversing the anterosuperior aspect of the labrum on the right.   MUSCLES/TENDONS:  Gluteus muscles/tendons: Full-thickness, high-grade insertional tear of the gluteus minimus. Right gluteus medius partial tear with likely full-thickness components involving the anterior fibers. Associated increased signal right  gluteus medius and minimus and fluid superficial right greater trochanter.  Common hamstring origins: Increased signal identified involving the hamstrings origin bilaterally with discrete fluid intensity signal.  Iliopsoas tendon: Intact.   OTHER SOFT TISSUES:  Unremarkable.   VISUALIZED PELVIC VISCERA:  1.4 x 3.8 cm right pelvic sidewall lymph node. Multiple enlarged lymph nodes identified retroperitoneum and distal aorta and iliacs. Left external iliac lymph node measuring 5.8 x 2.1 cm in conglomerate. Distal left para-aortic lymph node conglomerate measuring 6.1 x 2.4 cm. Small uterine leiomyoma.   IMPRESSION:   Extensive inguinal and retroperitoneal lymphadenopathy. Given the number and size of lymph nodes this may represent a lymphoproliferative disorder such as lymphoma. Metastatic disease or reactive lymph nodes not excluded.   High-grade tears of the right gluteus numbness greater than medius with background tendinosis and trochanteric bursitis.   Bilateral hamstring origin tendinosis with mild partial tearing.   Mild bilateral hip osteoarthritis.   Right sided labral tear.   HISTORY:   Allergies: No Known Allergies  Current Medications: Current Outpatient Medications  Medication Sig Dispense Refill   ascorbic acid (VITAMIN C) 500 MG tablet Take 1 tablet by mouth daily.     aspirin EC 81 MG tablet Take 1 tablet (81 mg total) by mouth daily. 30 tablet 2   Calcium Carb-Cholecalciferol (CALCIUM CARBONATE-VITAMIN D3 PO) Take 1 tablet by mouth daily.     chlorpheniramine (CHLOR-TRIMETON) 4 MG tablet Take 4 mg by mouth daily as needed for allergies.     ELDERBERRY PO Take 1 tablet by mouth daily.     FLUoxetine (PROZAC) 20 MG capsule Take 60 mg by mouth daily.     levothyroxine (SYNTHROID) 25 MCG tablet Take 25 mcg by mouth every morning.     Multiple Vitamin (MULTIVITAMIN ADULT PO) Take 1 capsule by mouth daily.     Omega 3 1000 MG CAPS Take 1,000 mg by mouth daily.      ondansetron (ZOFRAN) 4 MG tablet Take 1 tablet (4 mg total) by mouth every 8 (eight) hours as needed for nausea or vomiting. 20 tablet 0   Probiotic Product (PROBIOTIC BLEND PO) Take 1 capsule by mouth daily.     QUEtiapine (SEROQUEL) 100 MG tablet Take 100 mg by mouth daily as needed (depression).     QUEtiapine (SEROQUEL) 300 MG tablet Take 300  mg by mouth at bedtime.     ramipril (ALTACE) 1.25 MG capsule Take 1.25 mg by mouth daily.     rosuvastatin (CRESTOR) 20 MG tablet Take 1 tablet (20 mg total) by mouth daily. 30 tablet 5   zinc gluconate 50 MG tablet Take 50 mg by mouth daily.     No current facility-administered medications for this visit.    I, Rita Ohara, am acting as scribe for Derwood Kaplan, MD  I have reviewed this report as typed by the medical scribe, and it is complete and accurate.

## 2021-07-20 ENCOUNTER — Other Ambulatory Visit: Payer: Self-pay

## 2021-07-20 ENCOUNTER — Inpatient Hospital Stay (INDEPENDENT_AMBULATORY_CARE_PROVIDER_SITE_OTHER): Payer: Managed Care, Other (non HMO) | Admitting: Oncology

## 2021-07-20 ENCOUNTER — Other Ambulatory Visit: Payer: Self-pay | Admitting: Oncology

## 2021-07-20 ENCOUNTER — Inpatient Hospital Stay: Payer: Managed Care, Other (non HMO) | Attending: Oncology

## 2021-07-20 ENCOUNTER — Encounter: Payer: Self-pay | Admitting: Oncology

## 2021-07-20 VITALS — BP 139/79 | HR 88 | Temp 98.6°F | Resp 18 | Ht 60.0 in | Wt 129.3 lb

## 2021-07-20 DIAGNOSIS — C8302 Small cell B-cell lymphoma, intrathoracic lymph nodes: Secondary | ICD-10-CM

## 2021-07-20 DIAGNOSIS — C9110 Chronic lymphocytic leukemia of B-cell type not having achieved remission: Secondary | ICD-10-CM

## 2021-07-20 DIAGNOSIS — D649 Anemia, unspecified: Secondary | ICD-10-CM | POA: Insufficient documentation

## 2021-07-20 DIAGNOSIS — C8300 Small cell B-cell lymphoma, unspecified site: Secondary | ICD-10-CM | POA: Insufficient documentation

## 2021-07-20 LAB — BASIC METABOLIC PANEL
BUN: 30 — AB (ref 4–21)
CO2: 28 — AB (ref 13–22)
Chloride: 104 (ref 99–108)
Creatinine: 1 (ref 0.5–1.1)
Glucose: 111
Potassium: 4.5 (ref 3.4–5.3)
Sodium: 139 (ref 137–147)

## 2021-07-20 LAB — HEPATIC FUNCTION PANEL
ALT: 30 (ref 7–35)
AST: 33 (ref 13–35)
Alkaline Phosphatase: 69 (ref 25–125)
Bilirubin, Total: 0.3

## 2021-07-20 LAB — CBC AND DIFFERENTIAL
HCT: 34 — AB (ref 36–46)
Hemoglobin: 11.5 — AB (ref 12.0–16.0)
Neutrophils Absolute: 2.86
Platelets: 232 (ref 150–399)
WBC: 5.6

## 2021-07-20 LAB — LACTATE DEHYDROGENASE: LDH: 142 U/L (ref 98–192)

## 2021-07-20 LAB — COMPREHENSIVE METABOLIC PANEL
Albumin: 4 (ref 3.5–5.0)
Calcium: 8.7 (ref 8.7–10.7)

## 2021-07-20 LAB — CBC: RBC: 3.37 — AB (ref 3.87–5.11)

## 2021-07-20 LAB — SEDIMENTATION RATE: Sed Rate: 24 mm/hr — ABNORMAL HIGH (ref 0–22)

## 2021-07-23 ENCOUNTER — Other Ambulatory Visit: Payer: Self-pay | Admitting: Oncology

## 2021-07-23 DIAGNOSIS — D519 Vitamin B12 deficiency anemia, unspecified: Secondary | ICD-10-CM | POA: Insufficient documentation

## 2021-07-23 DIAGNOSIS — C8300 Small cell B-cell lymphoma, unspecified site: Secondary | ICD-10-CM | POA: Diagnosis not present

## 2021-07-23 DIAGNOSIS — C9110 Chronic lymphocytic leukemia of B-cell type not having achieved remission: Secondary | ICD-10-CM

## 2021-07-23 LAB — FERRITIN: Ferritin: 22 ng/mL (ref 11–307)

## 2021-07-23 LAB — FOLATE: Folate: 17.6 ng/mL (ref >5.9)

## 2021-07-23 LAB — RETICULOCYTES
Immature Retic Fract: 23 % — ABNORMAL HIGH (ref 2.3–15.9)
RBC.: 3.34 MIL/uL — ABNORMAL LOW (ref 3.87–5.11)
Retic Count, Absolute: 65.1 10*3/uL (ref 19.0–186.0)
Retic Ct Pct: 2 % (ref 0.4–3.1)

## 2021-07-23 LAB — IRON AND TIBC
Iron: 66 ug/dL (ref 28–170)
Saturation Ratios: 20 % (ref 10.4–31.8)
TIBC: 331 ug/dL (ref 250–450)
UIBC: 265 ug/dL

## 2021-07-23 LAB — VITAMIN B12: Vitamin B-12: 199 pg/mL (ref 180–914)

## 2021-07-24 LAB — HAPTOGLOBIN: Haptoglobin: 265 mg/dL (ref 37–355)

## 2021-07-24 NOTE — Progress Notes (Signed)
[facility] [address] [place]. [place] [address] [phone]       STROKE FOLLOW UP NOTE  [name] Date of Birth:  01-21-1961 Medical Record Number:  [id number]   Reason for Referral: stroke follow up    SUBJECTIVE:   CHIEF COMPLAINT:  Chief Complaint  Patient presents with   Follow-up    Rm 3 here for 4-5 month f/u. Pt reports she has been doing well since last visit.     HPI:   Update 07/24/2021 JM: patient returns for stroke follow up accompanied by her husband. Overall stable from stroke standpoint. Reports residual left hand outer hand pins/needle sensation. Denies any residual left foot symptoms. Does stilt have fatigue, possibly slightly worsened, working with oncology - scheduled for PET scan tomorrow, recent right hip imaging showed enlargement of groin lymph nodes and some abnormal lab work. Compliant on aspirin without side effects. Remains on atorvastatin - had decreased dose from 40 to 20 due to elevated LFT's back in December - since improved since decreasing dose, otherwise tolerating without side effects.  Blood pressure today 103/60. Does not routinely monitor at home. Reports recent labs by PCP including cholesterol levels on 1/23 which showed LDL 98 (pt showed results via her portal - unable to view via epic).  No further concerns at this time.     History provided for reference purposes only Update 03/29/2021 JM: patient being seen for stroke follow up accompanied by her husband.   Overall stable since discharge. Denies new stroke/TIA symptoms.  Fatigues quick - present prior to her stroke with slight worsening post stroke Left outer hand and outer foot numbness - greatly improving  Short term memory difficulties and occasional difficulty finding the correct word but has been gradually improving Balance has been gradually improving - no longer uses AD  Chronic back pain - [facility] recently received right hip injection for bursitis  Continues on aspirin and plavix despite 3 week recommendations - mild bruising otherwise no side effects Continues on atorvastatin 28m daily - denies side effects Blood pressure 122/78 - occasionally monitors at home - usually stable  No further concerns at this time  Stroke admission 02/08/2021 Ms. Dawn Narvaizis a 61y.o. 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  Personally reviewed hospitalization pertinent progress notes, lab work and imaging.  Evaluated by Dr. SLeonie Manfor right thalamic infarct secondary to small vessel disease.  CTA head/neck unremarkable.  2D echo EF 60 to 65% although echodensity noted therefore TEE completed which showed normal biventricular function without evidence of hemodynamically significant valvular heart disease, no evidence of thrombus and negative bubble study for intra-arterial shunt.  LDL 78.  A1c 6.1.  Recommended DAPT for 3 weeks and aspirin alone.  Increase home dose atorvastatin from 20 mg to 40 mg daily.  Evidence of pre-DM -no prior DM diagnosis.  No prior stroke history. CT spine showed diffuse lymphadenopathy throughout the neck consistent with B-cell lymphoma grossly stable since prior CT 09/2020.      PERTINENT IMAGING  CT head No acute infarct or hemorrhage, no metastatic disease.  CT spine No acute cervical spine fracture. Mild anterolisthesis of  C4 on C5, likely due to facet hypertrophy. Prominent spondylosis at C5-6 and C6-7 with minimal neural foraminal encroachment.  Diffuse lymphadenopathy throughout the neck, consistent with B-cell lymphoma. Grossly  stable since prior CT 10/11/2020. CTA head/neck normal MRI brain Acute small vessel infarction of the posterolateral right thalamus. Mild chronic small-vessel ischemic changes elsewhere affecting the cerebral hemispheric white matter. 2D Echo EF 60 to 65% however aortic valve disease noted TEE EF 60 to 65%.  Normal biventricular function with no evidence of hemodynamically significant valvular heart disease, no evidence of thrombus negative bubble study for intra-arterial shunt    ROS:   14 system review of systems performed and negative with exception of those listed in HPI  PMH:  Past Medical History:  Diagnosis Date   Allergy    seasonal   Arthritis    Bipolar 1 disorder (HCC)    Cancer ([place])    non hodgkins lymphoma   Depression    GERD (gastroesophageal reflux disease)    History of degenerative disc disease    Hyperlipidemia    Increased risk of breast cancer 04/25/2021   Obstructive sleep apnea    Osteopenia    Scoliosis    Thyroid disease    hypothyroidism   Vitamin D deficiency     PSH:  Past Surgical History:  Procedure Laterality Date   BREAST BIOPSY     BUBBLE STUDY  02/13/2021   Procedure: BUBBLE STUDY;  Surgeon: Skeet Latch, MD;  Location: [facility];  Service: Cardiovascular;;   CARPAL TUNNEL RELEASE Bilateral 2002   CESAREAN SECTION     x2   COLONOSCOPY  07/26/2008   Melanosis coli. Small internal hemorrhoids.    ENDOSCOPIC PLANTAR FASCIOTOMY     ESOPHAGOGASTRODUODENOSCOPY  03/17/2013   Mild gastritis. Status post esophageal dilatation.   LYMPH NODE BIOPSY     TEE WITHOUT CARDIOVERSION N/A 02/13/2021   Procedure: TRANSESOPHAGEAL ECHOCARDIOGRAM (TEE);  Surgeon: Skeet Latch, MD;  Location: [facility] ENDOSCOPY;  Service: Cardiovascular;  Laterality: N/A;   WISDOM TOOTH EXTRACTION      Social History:  Social History   Socioeconomic History   Marital status: Married    Spouse name: Not on file   Number of children: 2   Years of education:  Not on file   Highest education level: Associate degree: academic program  Occupational History   Not on file  Tobacco Use   Smoking status: Never   Smokeless tobacco: Never  Vaping Use   Vaping Use: Never used  Substance and Sexual Activity   Alcohol use: Not Currently   Drug use: Never   Sexual activity: Not on file  Other Topics Concern   Not on file  Social History Narrative   Right handed    1-2 cups of coffee per week   Lives at home with husband    Social Determinants of Health   Financial Resource Strain: Not on file  Food Insecurity: Not on file  Transportation Needs: Not on file  Physical Activity: Not on file  Stress: Not on file  Social Connections: Not on file  Intimate Partner Violence: Not on file    Family History:  Family History  Problem Relation Age of Onset   Prostate cancer Father 43   Melanoma Father 56   High blood pressure Father    Breast cancer Maternal Aunt    Multiple myeloma Maternal Aunt    Breast cancer Maternal Aunt    Colon cancer Neg Hx    Colon polyps  Neg Hx    Esophageal cancer Neg Hx    Rectal cancer Neg Hx    Stomach cancer Neg Hx     Medications:   Current Outpatient Medications on File Prior to Visit  Medication Sig Dispense Refill   ascorbic acid (VITAMIN C) 500 MG tablet Take 1 tablet by mouth daily.     aspirin EC 81 MG tablet Take 1 tablet (81 mg total) by mouth daily. 30 tablet 2   atorvastatin (LIPITOR) 40 MG tablet Take 1 tablet (40 mg total) by mouth daily. 30 tablet 0   Calcium Carb-Cholecalciferol (CALCIUM CARBONATE-VITAMIN D3 PO) Take 1 tablet by mouth daily.     chlorpheniramine (CHLOR-TRIMETON) 4 MG tablet Take 4 mg by mouth daily as needed for allergies.     ELDERBERRY PO Take 1 tablet by mouth daily.     FLUoxetine (PROZAC) 20 MG capsule Take 60 mg by mouth daily.     levothyroxine (SYNTHROID) 25 MCG tablet Take 25 mcg by mouth every morning.     Multiple Vitamin (MULTIVITAMIN ADULT PO) Take 1 capsule by  mouth daily.     Omega 3 1000 MG CAPS Take 1,000 mg by mouth daily.     ondansetron (ZOFRAN) 4 MG tablet Take 1 tablet (4 mg total) by mouth every 8 (eight) hours as needed for nausea or vomiting. 20 tablet 0   Probiotic Product (PROBIOTIC BLEND PO) Take 1 capsule by mouth daily.     QUEtiapine (SEROQUEL) 100 MG tablet Take 100 mg by mouth daily as needed (depression).     QUEtiapine (SEROQUEL) 300 MG tablet Take 300 mg by mouth at bedtime.     ramipril (ALTACE) 1.25 MG capsule Take 1.25 mg by mouth daily.     zinc gluconate 50 MG tablet Take 50 mg by mouth daily.     No current facility-administered medications on file prior to visit.    Allergies:  No Known Allergies    OBJECTIVE:  Physical Exam  Vitals:   07/25/21 0805  BP: 103/60  Pulse: 88  Weight: 127 lb 8 oz (57.8 kg)  Height: 5' (1.524 m)    Body mass index is 24.9 kg/m. No results found.  General: Frail petite very pleasant middle-aged Caucasian female, seated, in no evident distress Head: head normocephalic and atraumatic.   Neck: supple with no carotid or supraclavicular bruits Cardiovascular: regular rate and rhythm, no murmurs Musculoskeletal: no deformity; trace edema right foot (chronic per patient) Skin:  no rash/petichiae Vascular:  Normal pulses all extremities   Neurologic Exam Mental Status: Awake and fully alert.  Fluent speech and language.  Oriented to place and time. Recent memory subjectively mildly impaired and remote memory intact. Attention span, concentration and fund of knowledge appropriate. Mood and affect appropriate.  Cranial Nerves: Pupils equal, briskly reactive to light. Extraocular movements full without nystagmus. Visual fields full to confrontation. Hearing intact. Facial sensation intact. Face, tongue, palate moves normally and symmetrically.  Motor: Normal bulk and tone. Normal strength in all tested extremity muscles Sensory.:  Decreased/altered sensation left outer hand and outer  edge of foot Coordination: Rapid alternating movements normal in all extremities. Finger-to-nose and heel-to-shin performed accurately bilaterally. Slightly orbits right arm over left arm. Gait and Station: Arises from chair without difficulty. Stance is normal. Gait demonstrates normal stride length and balance without use of assistive device. Tandem walk and heel toe moderate difficulty.  Reflexes: 1+ and symmetric. Toes downgoing.        ASSESSMENT: Dawn Paris  Prince is a 61 y.o. year old female right thalamic stroke on 02/08/2021 secondary to small vessel disease. 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     PLAN:  Right thalamic stroke:  Residual deficit: mild left hand and foot sensory impairment - gradually improving.  Continue aspirin 81 mg daily for secondary stroke prevention.   Stop atorvastatin 20 mg daily and start Crestor 20 mg daily -intolerant to higher dose atorvastatin due to elevated LFTs.  Recent LDL 98. Request f/u with PCP in the next 2-3 months for repeat lipid panel and monitoring of LFTs. Request PCP take over managing and prescribing of statins Discussed secondary stroke prevention measures and importance of close PCP follow up for aggressive stroke risk factor management including HTN with BP goal<130/90, HLD with LDL goal<70 and pre-DM with A1c goal<7. I have gone over the pathophysiology of stroke, warning signs and symptoms, risk factors and their management in some detail with instructions to go to the closest emergency room for symptoms of concern. B-cell lymphoma: Closely followed by oncology.  Plans on PET scan tomorrow and follow-up with oncology next week to review results.    Doing well from stroke standpoint and risk factors are managed by PCP. She may follow up PRN, as usual for our patients who are strictly being followed for stroke. If any new neurological issues should arise,  request PCP place referral for evaluation by one of our neurologists. Thank you.      CC:  PCP: Ernestene Kiel, MD    I spent 34 minutes of face-to-face and non-face-to-face time with patient and husband.  This included previsit chart review, lab review, order review, study review, electronic health record documentation, patient and husband education regarding prior stroke including etiology, secondary stroke prevention measures and importance of managing stroke risk factors, residual deficits and hopeful further recovery and answered all other questions as noted above to patient and husband's satisfaction  Frann Rider, AGNP-BC  [facility] [address] [place], [place] [address]  Phone [phone] Fax [phone] Note: This document was prepared with digital dictation and possible smart phrase technology. Any transcriptional errors that result from this process are unintentional.

## 2021-07-25 ENCOUNTER — Ambulatory Visit: Payer: Managed Care, Other (non HMO) | Admitting: Oncology

## 2021-07-25 ENCOUNTER — Other Ambulatory Visit: Payer: Managed Care, Other (non HMO)

## 2021-07-25 ENCOUNTER — Encounter: Payer: Self-pay | Admitting: Adult Health

## 2021-07-25 ENCOUNTER — Ambulatory Visit (INDEPENDENT_AMBULATORY_CARE_PROVIDER_SITE_OTHER): Payer: Managed Care, Other (non HMO) | Admitting: Adult Health

## 2021-07-25 VITALS — BP 103/60 | HR 88 | Ht 60.0 in | Wt 127.5 lb

## 2021-07-25 DIAGNOSIS — I6381 Other cerebral infarction due to occlusion or stenosis of small artery: Secondary | ICD-10-CM | POA: Diagnosis not present

## 2021-07-25 MED ORDER — ROSUVASTATIN CALCIUM 20 MG PO TABS: 20.0000 mg | ORAL_TABLET | Freq: Every day | ORAL | 5 refills | Status: DC

## 2021-07-25 NOTE — Patient Instructions (Addendum)
Continue aspirin 81 mg daily  and stop atorvastatin and start Crestor 20mg  daily for secondary stroke prevention ?Please ensure you follow up with your PCP in 2-3 months for repeat lab work ? ?Continue to follow up with PCP regarding blood pressure and cholesterol management  ?Maintain strict control of hypertension with blood pressure goal below 130/90 and cholesterol with LDL cholesterol (bad cholesterol) goal below 70 mg/dL.  ? ?Signs of a Stroke? Follow the BEFAST method:  ?Balance Watch for a sudden loss of balance, trouble with coordination or vertigo ?Eyes Is there a sudden loss of vision in one or both eyes? Or double vision?  ?Face: Ask the person to smile. Does one side of the face droop or is it numb?  ?Arms: Ask the person to raise both arms. Does one arm drift downward? Is there weakness or numbness of a leg? ?Speech: Ask the person to repeat a simple phrase. Does the speech sound slurred/strange? Is the person confused ? ?Time: If you observe any of these signs, call 911. ? ? ? ? ? ? ? ?Thank you for coming to see [place] at [facility]. I hope we have been able to provide you high quality care today. ? ?You may receive a patient satisfaction survey over the next few weeks. We would appreciate your feedback and comments so that we may continue to improve ourselves and the health of our patients. ? ?Stroke Prevention ?Some medical conditions and lifestyle choices can lead to a higher risk for a stroke. You can help to prevent a stroke by eating healthy foods and exercising. It also helps to not smoke and to manage any health problems you may have. ?How can this condition affect me? ?A stroke is an emergency. It should be treated right away. A stroke can lead to brain damage or threaten your life. There is a better chance of surviving and getting better after a stroke if you get medical help right away. ?What can increase my risk? ?The following medical conditions may increase your risk of  a stroke: ?Diseases of the heart and blood vessels (cardiovascular disease). ?High blood pressure (hypertension). ?Diabetes. ?High cholesterol. ?Sickle cell disease. ?Problems with blood clotting. ?Being very overweight. ?Sleeping problems (obstructivesleep apnea). ?Other risk factors include: ?Being older than age 59. ?A history of blood clots, stroke, or mini-stroke (TIA). ?Race, ethnic background, or a family history of stroke. ?Smoking or using tobacco products. ?Taking birth control pills, especially if you smoke. ?Heavy alcohol and drug use. ?Not being active. ?What actions can I take to prevent this? ?Manage your health conditions ?High cholesterol. ?Eat a healthy diet. If this is not enough to manage your cholesterol, you may need to take medicines. ?Take medicines as told by your doctor. ?High blood pressure. ?Try to keep your blood pressure below 130/80. ?If your blood pressure cannot be managed through a healthy diet and regular exercise, you may need to take medicines. ?Take medicines as told by your doctor. ?Ask your doctor if you should check your blood pressure at home. ?Have your blood pressure checked every year. ?Diabetes. ?Eat a healthy diet and get regular exercise. If your blood sugar (glucose) cannot be managed through diet and exercise, you may need to take medicines. ?Take medicines as told by your doctor. ?Talk to your doctor about getting checked for sleeping problems. Signs of a problem can include: ?Snoring a lot. ?Feeling very tired. ?Make sure that you manage any other conditions you have. ?Nutrition ? ?Follow instructions from  your doctor about what to eat or drink. You may be told to: ?Eat and drink fewer calories each day. ?Limit how much salt (sodium) you use to 1,500 milligrams (mg) each day. ?Use only healthy fats for cooking, such as olive oil, canola oil, and sunflower oil. ?Eat healthy foods. To do this: ?Choose foods that are high in fiber. These include whole grains, and  fresh fruits and vegetables. ?Eat at least 5 servings of fruits and vegetables a day. Try to fill one-half of your plate with fruits and vegetables at each meal. ?Choose low-fat (lean) proteins. These include low-fat cuts of meat, chicken without skin, fish, tofu, beans, and nuts. ?Eat low-fat dairy products. ?Avoid foods that: ?Are high in salt. ?Have saturated fat. ?Have trans fat. ?Have cholesterol. ?Are processed or pre-made. ?Count how many carbohydrates you eat and drink each day. ?Lifestyle ?If you drink alcohol: ?Limit how much you have to: ?0-1 drink a day for women who are not pregnant. ?0-2 drinks a day for men. ?Know how much alcohol is in your drink. In the [place] one drink equals one 12 oz bottle of beer (354mL), one 5 oz glass of wine (157mL), or one 1? oz glass of hard liquor (76mL). ?Do not smoke or use any products that have nicotine or tobacco. If you need help quitting, ask your doctor. ?Avoid secondhand smoke. ?Do not use drugs. ?Activity ? ?Try to stay at a healthy weight. ?Get at least 30 minutes of exercise on most days, such as: ?Fast walking. ?Biking. ?Swimming. ?Medicines ?Take over-the-counter and prescription medicines only as told by your doctor. ?Avoid taking birth control pills. Talk to your doctor about the risks of taking birth control pills if: ?You are over 69 years old. ?You smoke. ?You get very bad headaches. ?You have had a blood clot. ?Where to find more information ?American Stroke Association: www.strokeassociation.org ?Get help right away if: ?You or a loved one has any signs of a stroke. "BE FAST" is an easy way to remember the warning signs: ?B - Balance. Dizziness, sudden trouble walking, or loss of balance. ?E - Eyes. Trouble seeing or a change in how you see. ?F - Face. Sudden weakness or loss of feeling of the face. The face or eyelid may droop on one side. ?A - Arms. Weakness or loss of feeling in an arm. This happens all of a sudden and most often on one side of the  body. ?S - Speech. Sudden trouble speaking, slurred speech, or trouble understanding what people say. ?T - Time. Time to call emergency services. Write down what time symptoms started. ?You or a loved one has other signs of a stroke, such as: ?A sudden, very bad headache with no known cause. ?Feeling like you may vomit (nausea). ?Vomiting. ?A seizure. ?These symptoms may be an emergency. Get help right away. Call your local emergency services (911 in the [place] ?Do not wait to see if the symptoms will go away. ?Do not drive yourself to the hospital. ?Summary ?You can help to prevent a stroke by eating healthy, exercising, and not smoking. It also helps to manage any health problems you have. ?Do not smoke or use any products that contain nicotine or tobacco. ?Get help right away if you or a loved one has any signs of a stroke. ?This information is not intended to replace advice given to you by your health care provider. Make sure you discuss any questions you have with your health care provider. ?Document Revised: 12/13/2019  Document Reviewed: 12/13/2019 ?Elsevier Patient Education ? 2022 [place]. ? ? ?

## 2021-07-26 ENCOUNTER — Ambulatory Visit: Payer: Managed Care, Other (non HMO) | Admitting: Adult Health

## 2021-07-29 ENCOUNTER — Encounter: Payer: Self-pay | Admitting: Oncology

## 2021-07-29 NOTE — Progress Notes (Signed)
[facility]  [address] [place],  [place]  [address] [phone]  Clinic Day:  07/30/2021  Referring physician: Ernestene Kiel, MD  This document serves as a record of services personally performed by Hosie Poisson, MD. It was created on their behalf by Curry,Lauren E, a trained medical scribe. The creation of this record is based on the scribe's personal observations and the provider's statements to them.  ASSESSMENT & PLAN:   Assessment & Plan: 1. Small lymphocytic low-grade lymphoma, diagnosed in June 2017.  She remains fairly stable on observation only. She has had stable bilateral cervical and inguinal lymphadenopathy.  However, recent MRI imaging from February 2023 has revealed extensive inguinal and retroperitoneal lymphadenopathy. Her physical exam reveals a modest change in her cervical and supraclavicular adenopathy. However, this is largely asymptomatic other than anemia. PET imaging from March confirms continued stability of mild lymphadenopathy in the neck, chest, abdomen and pelvis with low level FDG and Deauville 2-3 category uptake. Therefore, we will continue with surveillance.   2. Anemia, macrocytic. B12 was low normal at 199. She is receiving B12 injections, currently 1 cc once weekly.   3. Elevated risk of breast cancer over 29% due to personal and family history.  We have recommended annual MRI breasts in addition to annual mammography, but we will hold off for now in view of her other circumstances.  PET imaging confirms continued stability of mild lymphadenopathy in the neck, chest, abdomen and pelvis. Lymph nodes show low level FDG accumulation. Majority of measured lymph nodes show Deauville 2-3 category uptake. With these results and as she remains mainly asymptomatic, we will continue with surveillance. We will plan to see her back in 3 months with CBC, CMP, B12 and LDH for repeat evaluation. Both her and her husband  verbalize understanding of and agreement to the plans discussed today. They know to call the office should any new questions or concerns arise.   I provided 20 minutes of face-to-face time during this this encounter and > 50% was spent counseling as documented under my assessment and plan.    [facility] [address] [place] [address] Dept: [phone] Dept Fax: [phone]   No orders of the defined types were placed in this encounter.     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  Staging CT chest revealed adenopathy of the bilateral neck measuring up to 18 mm in diameter with left subclavian, bilateral axillary and subpectoral adenopathy, but no mediastinal nodes.  CT abdomen was  negative.  CT pelvis revealed bilateral external iliac nodes up to 11 mm in diameter.  She had B symptoms with severe night sweats and weight loss.  She has been on  observation only.  Due to her family history of breast cancer, she underwent testing for hereditary breast and ovarian cancer with the [facility] myRisk Hereditary Cancer Gene panel test.  This did not reveal any clinically significant mutation.  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  Her [facility] breast cancer risk assessment showed her lifetime risk of breast cancer to be 29.7%, so annual breast MRI, in addition to mammogram is recommended.  Mammogram and MRI breast done in August  2019 did not reveal any evidence of malignancy.  She did go for a second opinion to [facility] regarding her lymphoma and they concurred with the approach of watchful waiting.    She presented to the emergency room in mid April 2021 due to  increased urinary frequency and discomfort.  CT imaging revealed left sided obstructive uropathy with 3 mm calculus in the distal left ureter just proximal to the ureterovesical unction causing moderate hydroureteronephrosis and perinephric stranding.  Left greater than right iliac and pelvic lymphadenopathy, slightly greater than on 10/26/18, consistent with known history of lymphoma.  We did not repeat a scan in June as scheduled.  Annual screening bilateral mammogram from August 2021 was clear.     CT neck from May 2022 revealed multiple lymph nodes in the neck bilaterally, with mild progression of lymph nodes on the right. There has been more significant progression of left level 4 and left supraclavicular lymph nodes compared to the prior study. Largest lymph node in the left supraclavicular region measures 35 x 17 mm. Findings were compatible with lymphoma. CT chest/abdomen/pelvis revealed no substantial interval change in exam. Bilateral supraclavicular, subpectoral, axillary, retroperitoneal, and pelvic lymphadenopathy was similar to prior. There were no definite findings of progression.  At her visit in September, the lymphoma remained stable. She continued to have sciatic pain unrelieved with gabapentin 800 mg TID. She had received 2 steroid injections of the spine with improvement and was scheduled for another. Due to this, MRI breast was not scheduled.    MRI of the right hip from February 2023 revealed extensive inguinal and retroperitoneal lymphadenopathy. There are high-grade tears of the right gluteus numbness greater than medius with background tendinosis and trochanteric bursitis, and bilateral hamstring origin tendinosis with mild partial tearing. She has mild bilateral hip osteoarthritis, and  right sided labral tear. She states that she has chronic palpable lymph nodes of the left neck and supraclavicular area.    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  ([facility])  11/11/2015 Cancer Staging   Staging form: Hodgkin and Non-Hodgkin Lymphoma, AJCC 8th Edition - Clinical stage from 11/11/2015: Stage III (Small lymphocytic leukemia) - Signed by Derwood Kaplan, MD on 01/28/2021 Histopathologic type: Malignant lymphoma, small B lymphocytic, NOS (see also M-9823/3) Stage prefix: Initial diagnosis Diagnostic confirmation: Positive histology PLUS positive immunophenotyping and/or positive genetic studies Specimen type: Core Needle Biopsy Staged by: Managing physician Stage used in treatment planning: Yes National guidelines used in treatment planning: Yes Type of national guideline used in treatment planning: NCCN Staging comments: Watchful waiting    06/20/2020 Initial Diagnosis   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)   Chronic lymphocytic leukemia (CLL), B-cell ([place])  10/31/2015 Initial Diagnosis   Chronic lymphocytic leukemia (CLL), B-cell ([facility])   11/11/2015 Cancer Staging   Staging form: Chronic Lymphocytic Leukemia / Small Lymphocytic Lymphoma, AJCC 8th Edition - Clinical stage from 11/11/2015: Modified Rai Stage III (Modified Rai risk: High, Binet: Stage B, Lugano: Stage III, Lymphocytosis: Absent, Adenopathy: Present, Organomegaly: Absent, Anemia: Absent, Thrombocytopenia: Absent) - Signed by Derwood Kaplan, MD on 04/05/2021 Histopathologic type: B-cell lymphocytic leukemia/small lymphocytic lymphoma (see also M-9670/3) Stage prefix: Initial diagnosis Stage used in treatment planning: Yes National guidelines used in treatment planning: Yes Type of national guideline used in treatment planning: NCCN       INTERVAL HISTORY:  Zian is here for follow up to review recent imaging results. PET imaging from March 2nd revealed continued  stability of mild lymphadenopathy in the neck, chest, abdomen, and pelvis. Lymph nodes show low level FDG accumulation. Majority of measured lymph nodes show Deauville 2-3 category  uptake. She states that she feels well but does note more night sweats. She is self administering B12 injections, 1 cc weekly as prescribed. She did have recently blood work through [facility], and she will get [place] copies of the results. We did do a lab evaluation of her anemia, and all was good other than a B12 level of 199. Her  appetite is good, and she has lost 2 pounds since her last visit.  She denies fever, chills or other signs of infection.  She denies nausea, vomiting, bowel issues, or abdominal pain.  She denies sore throat, cough, dyspnea, or chest pain.   REVIEW OF SYSTEMS:  Review of Systems  Constitutional:  Positive for fatigue. Negative for appetite change, chills, fever and unexpected weight change.       Night sweats  HENT:  Negative.    Eyes: Negative.   Respiratory: Negative.  Negative for chest tightness, cough, hemoptysis, shortness of breath and wheezing.   Cardiovascular: Negative.  Negative for chest pain, leg swelling and palpitations.  Gastrointestinal:  Negative for abdominal distention, abdominal pain, blood in stool, constipation, diarrhea, nausea and vomiting.  Endocrine: Negative.   Genitourinary: Negative.  Negative for difficulty urinating, dysuria, frequency and hematuria.   Musculoskeletal:  Negative for arthralgias, back pain, flank pain, gait problem and myalgias.  Skin: Negative.   Neurological: Negative.  Negative for dizziness, extremity weakness, gait problem, headaches, light-headedness, numbness, seizures and speech difficulty.  Hematological: Negative.   Psychiatric/Behavioral: Negative.  Negative for depression and sleep disturbance. The patient is not nervous/anxious.    fatigue VITALS:  Blood pressure (!) 152/83, pulse 90, temperature 98.1 F (36.7 C), temperature source Oral, resp. rate 18, height 5' (1.524 m), weight 127 lb (57.6 kg), SpO2 98 %.  Wt Readings from Last 3 Encounters:  07/30/21 127 lb (57.6 kg)  07/25/21 127 lb 8 oz (57.8 kg)   07/20/21 129 lb 4.8 oz (58.7 kg)    Body mass index is 24.8 kg/m.  Performance status (ECOG): 1 - Symptomatic but completely ambulatory  PHYSICAL EXAM:  Physical Exam Constitutional:      General: She is not in acute distress.    Appearance: Normal appearance. She is normal weight.  HENT:     Head: Normocephalic and atraumatic.  Eyes:     General: No scleral icterus.    Extraocular Movements: Extraocular movements intact.     Conjunctiva/sclera: Conjunctivae normal.     Pupils: Pupils are equal, round, and reactive to light.  Cardiovascular:     Rate and Rhythm: Normal rate and regular rhythm.     Pulses: Normal pulses.     Heart sounds: Normal heart sounds. No murmur heard.   No friction rub. No gallop.  Pulmonary:     Effort: Pulmonary effort is normal. No respiratory distress.     Breath sounds: Normal breath sounds.  Abdominal:     General: Bowel sounds are normal. There is no distension.     Palpations: Abdomen is soft. There is no hepatomegaly, splenomegaly or mass.     Tenderness: There is no abdominal tenderness.  Musculoskeletal:        General: Normal range of motion.     Cervical back: Normal range of motion and neck supple.     Right lower leg: No edema.     Left lower  leg: No edema.  Lymphadenopathy:     Cervical: Cervical adenopathy present.     Left cervical: Posterior cervical adenopathy (measuring 1 cm) present.     Upper Body:     Right upper body: No supraclavicular or axillary adenopathy.     Left upper body: Supraclavicular adenopathy (all less than 1 cm) and axillary adenopathy (with a node measuring 2 cm, mobile and nontender) present.     Lower Body: No right inguinal adenopathy. Left inguinal adenopathy (firmness with a possible 1-2 cm node above the ligament) present.     Comments:    Skin:    General: Skin is warm and dry.  Neurological:     General: No focal deficit present.     Mental Status: She is alert and oriented to person, place,  and time. Mental status is at baseline.  Psychiatric:        Mood and Affect: Mood normal.        Behavior: Behavior normal.        Thought Content: Thought content normal.        Judgment: Judgment normal.    LABS:   CBC Latest Ref Rng & Units 07/20/2021 04/26/2021 02/13/2021  WBC - 5.6 4.7 4.0  Hemoglobin 12.0 - 16.0 11.5(A) 13.2 10.5(L)  Hematocrit 36 - 46 34(A) 40 32.7(L)  Platelets 150 - 399 232 225 200   CMP Latest Ref Rng & Units 07/20/2021 04/26/2021 02/13/2021  Glucose 70 - 99 mg/dL - - 114(H)  BUN 4 - 21 30(A) 16 16  Creatinine 0.5 - 1.1 1.0 1.1 0.89  Sodium 137 - 147 139 141 139  Potassium 3.4 - 5.3 4.5 4.1 4.8  Chloride 99 - 108 104 105 103  CO2 13 - 22 28(A) 29(A) 29  Calcium 8.7 - 10.7 8.7 9.3 8.9  Total Protein 6.5 - 8.1 g/dL - - 5.6(L)  Total Bilirubin 0.3 - 1.2 mg/dL - - 0.3  Alkaline Phos 25 - 125 69 80 46  AST 13 - 35 33 61(A) 27  ALT 7 - 35 30 81(A) 35    Lab Results  Component Value Date   LDH 142 07/20/2021   LDH 147 04/26/2021   LDH 166 01/25/2021    STUDIES:  No results found.   EXAM: 07/26/2021 NUCLEAR MEDICINE PET SKULL BASE TO THIGH   TECHNIQUE:  12.4 mCi F-18 FDG was injected intravenously. Full-ring PET imaging  was performed from the skull base to thigh after the radiotracer. CT  data was obtained and used for attenuation correction and anatomic  localization.   Fasting blood glucose: 107 mg/dl   COMPARISON:  Chest abdomen pelvis CT 10/11/2020   FINDINGS:  Mediastinal blood pool activity: SUV max 2.1   Liver activity: SUV max 2.7   NECK: Small lymph nodes are seen in the neck bilaterally. Index  left-sided level IV node measuring 8 mm short axis on 31/2 has SUV  max = 1.7.   Incidental CT findings: none   CHEST: Supraclavicular, bilateral supraclavicular and axillary  lymphadenopathy evident. No mediastinal or discernible hilar  lymphadenopathy.   Index left axillary node measuring 1.3 cm short axis on 50/2 was 1.3  cm on  previous CT of 10/11/2020 (remeasured). SUV max = 1.9.   1.1 cm short axis right hilar node on 42/2 was 1.2 cm previously  (remeasured). SUV max = 1.9.   Incidental CT findings: none   ABDOMEN/PELVIS: No abnormal hypermetabolic activity within the  liver, pancreas, adrenal glands, or  spleen.   Similar appearance of retroperitoneal and pelvic sidewall  lymphadenopathy.   Index left para-aortic node measuring 1.2 cm short axis on 96/2  today was 1.1 cm short axis on the prior study (remeasured). SUV max  = 2.0.   1.2 cm short axis left common iliac node on image 110/2 was 1.3 cm  previously. SUV max = 2.5 today.   Index 1.9 cm short axis left external iliac node on image 130/2 was  1.9 cm short axis on the previous exam (remeasured). SUV max = 3.0.   1.0 cm short axis left groin node visible on image 136/2 today was  0.9 cm previously (remeasured) SUV max = 1.9.   Incidental CT findings: none   SKELETON: No focal hypermetabolic activity to suggest skeletal  metastasis.   Incidental CT findings: Thoracolumbar scoliosis evident.   IMPRESSION:  1. Continued stability of mild lymphadenopathy in the neck, chest,  abdomen, and pelvis. Lymph nodes show low level FDG accumulation.  Majority of measured lymph nodes show Deauville 2-3 category uptake.   HISTORY:   Allergies:  Allergies  Allergen Reactions   Voltaren [Diclofenac Sodium]     Thought it had something to do with her having a stroke    Current Medications: Current Outpatient Medications  Medication Sig Dispense Refill   ascorbic acid (VITAMIN C) 500 MG tablet Take 1 tablet by mouth daily.     aspirin EC 81 MG tablet Take 1 tablet (81 mg total) by mouth daily. 30 tablet 2   Calcium Carb-Cholecalciferol (CALCIUM CARBONATE-VITAMIN D3 PO) Take 1 tablet by mouth daily.     chlorpheniramine (CHLOR-TRIMETON) 4 MG tablet Take 4 mg by mouth daily as needed for allergies.     ELDERBERRY PO Take 1 tablet by mouth daily.      FLUoxetine (PROZAC) 20 MG capsule Take 60 mg by mouth daily.     levothyroxine (SYNTHROID) 25 MCG tablet Take 25 mcg by mouth every morning.     Multiple Vitamin (MULTIVITAMIN ADULT PO) Take 1 capsule by mouth daily.     Omega 3 1000 MG CAPS Take 1,000 mg by mouth daily.     ondansetron (ZOFRAN) 4 MG tablet Take 1 tablet (4 mg total) by mouth every 8 (eight) hours as needed for nausea or vomiting. 20 tablet 0   Probiotic Product (PROBIOTIC BLEND PO) Take 1 capsule by mouth daily.     QUEtiapine (SEROQUEL) 100 MG tablet Take 100 mg by mouth daily as needed (depression).     QUEtiapine (SEROQUEL) 300 MG tablet Take 300 mg by mouth at bedtime.     zinc gluconate 50 MG tablet Take 50 mg by mouth as needed.     No current facility-administered medications for this visit.    I, Rita Ohara, am acting as scribe for Derwood Kaplan, MD  I have reviewed this report as typed by the medical scribe, and it is complete and accurate.

## 2021-07-30 ENCOUNTER — Other Ambulatory Visit: Payer: Self-pay | Admitting: Oncology

## 2021-07-30 ENCOUNTER — Inpatient Hospital Stay: Payer: Managed Care, Other (non HMO) | Attending: Oncology | Admitting: Oncology

## 2021-07-30 ENCOUNTER — Other Ambulatory Visit: Payer: Self-pay

## 2021-07-30 ENCOUNTER — Telehealth: Payer: Self-pay | Admitting: Oncology

## 2021-07-30 VITALS — BP 152/83 | HR 90 | Temp 98.1°F | Resp 18 | Ht 60.0 in | Wt 127.0 lb

## 2021-07-30 DIAGNOSIS — C8302 Small cell B-cell lymphoma, intrathoracic lymph nodes: Secondary | ICD-10-CM | POA: Diagnosis not present

## 2021-07-30 DIAGNOSIS — D519 Vitamin B12 deficiency anemia, unspecified: Secondary | ICD-10-CM

## 2021-07-30 NOTE — Telephone Encounter (Signed)
Patient has been scheduled for follow-up visit per 07/30/21 los. Pt given an appt calendar with date and time. ? ?

## 2021-08-05 ENCOUNTER — Encounter: Payer: Self-pay | Admitting: Oncology

## 2021-08-06 ENCOUNTER — Ambulatory Visit (HOSPITAL_COMMUNITY): Payer: Managed Care, Other (non HMO)

## 2021-08-06 ENCOUNTER — Encounter (HOSPITAL_COMMUNITY): Payer: Self-pay

## 2021-09-24 HISTORY — PX: OTHER SURGICAL HISTORY: SHX169

## 2021-10-17 ENCOUNTER — Emergency Department (HOSPITAL_COMMUNITY): Payer: Managed Care, Other (non HMO)

## 2021-10-17 ENCOUNTER — Telehealth: Payer: Self-pay | Admitting: Adult Health

## 2021-10-17 ENCOUNTER — Encounter (HOSPITAL_COMMUNITY): Payer: Self-pay

## 2021-10-17 ENCOUNTER — Other Ambulatory Visit: Payer: Self-pay

## 2021-10-17 ENCOUNTER — Emergency Department (HOSPITAL_COMMUNITY)
Admission: EM | Admit: 2021-10-17 | Discharge: 2021-10-17 | Disposition: A | Discharge status: Home / Self Care | Payer: Managed Care, Other (non HMO) | Attending: Emergency Medicine | Admitting: Emergency Medicine

## 2021-10-17 DIAGNOSIS — R030 Elevated blood-pressure reading, without diagnosis of hypertension: Secondary | ICD-10-CM | POA: Diagnosis not present

## 2021-10-17 DIAGNOSIS — R200 Anesthesia of skin: Secondary | ICD-10-CM | POA: Diagnosis present

## 2021-10-17 DIAGNOSIS — R202 Paresthesia of skin: Secondary | ICD-10-CM | POA: Insufficient documentation

## 2021-10-17 DIAGNOSIS — Z7982 Long term (current) use of aspirin: Secondary | ICD-10-CM | POA: Insufficient documentation

## 2021-10-17 LAB — CBC
HCT: 38.5 % (ref 36.0–46.0)
Hemoglobin: 12.6 g/dL (ref 12.0–15.0)
MCH: 33.7 pg (ref 26.0–34.0)
MCHC: 32.7 g/dL (ref 30.0–36.0)
MCV: 102.9 fL — ABNORMAL HIGH (ref 80.0–100.0)
Platelets: 234 10*3/uL (ref 150–400)
RBC: 3.74 MIL/uL — ABNORMAL LOW (ref 3.87–5.11)
RDW: 14.4 % (ref 11.5–15.5)
WBC: 6.4 10*3/uL (ref 4.0–10.5)
nRBC: 0 % (ref 0.0–0.2)

## 2021-10-17 LAB — BASIC METABOLIC PANEL
Anion gap: 6 (ref 5–15)
BUN: 18 mg/dL (ref 8–23)
CO2: 28 mmol/L (ref 22–32)
Calcium: 9.4 mg/dL (ref 8.9–10.3)
Chloride: 106 mmol/L (ref 98–111)
Creatinine, Ser: 0.74 mg/dL (ref 0.44–1.00)
GFR, Estimated: >60 mL/min (ref >60)
Glucose, Bld: 113 mg/dL — ABNORMAL HIGH (ref 70–99)
Potassium: 4.5 mmol/L (ref 3.5–5.1)
Sodium: 140 mmol/L (ref 135–145)

## 2021-10-17 NOTE — ED Notes (Signed)
The pt is c/o a strange sensation in her head that also comes and goes

## 2021-10-17 NOTE — ED Provider Notes (Signed)
[facility] EMERGENCY DEPARTMENT Provider Note   CSN: [id number] Arrival date & time: 10/17/21  1313     History  Chief Complaint  Patient presents with   Numbness    Dawn Prince is a 61 y.o. female.  Patient c/o left foot numbness (> right foot numbness), and funny/strange feeling in head on and off in past few days. Current symptoms present since this AM, constant. Denies neck or back pain - no radicular pain. No hand numbness/tingling. No loss of normal function. No change in speech or vision. 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   The history is provided by the patient, the spouse and medical records.      Home Medications Prior to Admission medications   Medication Sig Start Date End Date Taking? Authorizing Provider  ascorbic acid (VITAMIN C) 500 MG tablet Take 1 tablet by mouth daily. 06/20/20   [provider]  aspirin EC 81 MG tablet Take 1 tablet (81 mg total) by mouth daily. 02/13/21   Thurnell Lose, MD  Calcium Carb-Cholecalciferol (CALCIUM CARBONATE-VITAMIN D3 PO) Take 1 tablet by mouth daily.    [provider]  chlorpheniramine (CHLOR-TRIMETON) 4 MG tablet Take 4 mg by mouth daily as needed for allergies. 07/11/20   [provider]  ELDERBERRY PO Take 1 tablet by mouth daily.    [provider]  FLUoxetine (PROZAC) 20 MG capsule Take 60 mg by mouth daily. 06/13/20   [provider]  levothyroxine (SYNTHROID) 25 MCG tablet Take 25 mcg by mouth every morning. 06/13/20   [provider]  Multiple Vitamin (MULTIVITAMIN ADULT PO) Take 1 capsule by mouth daily. 06/20/20   [provider]  Omega 3 1000 MG CAPS Take 1,000 mg by mouth daily.    [provider]  ondansetron (ZOFRAN) 4 MG tablet Take 1 tablet (4 mg total) by mouth every 8 (eight) hours as needed for nausea or vomiting. 04/26/21   Mosher, Vida Roller A, PA-C  Probiotic Product (PROBIOTIC BLEND PO) Take 1  capsule by mouth daily.    [provider]  QUEtiapine (SEROQUEL) 100 MG tablet Take 100 mg by mouth daily as needed (depression). 03/08/20   [provider]  QUEtiapine (SEROQUEL) 300 MG tablet Take 300 mg by mouth at bedtime. 11/29/20   [provider]  zinc gluconate 50 MG tablet Take 50 mg by mouth as needed.    [provider]      Allergies    Voltaren [diclofenac sodium]    Review of Systems   Review of Systems  Constitutional:  Negative for fever.  HENT:  Negative for sore throat.   Eyes:  Negative for visual disturbance.  Respiratory:  Negative for shortness of breath.   Cardiovascular:  Negative for chest pain.  Gastrointestinal:  Negative for abdominal pain.  Genitourinary:  Negative for flank pain.  Musculoskeletal:  Negative for back pain and neck pain.  Skin:  Negative for rash.  Neurological:  Positive for numbness.       Mild dull head pain/describes strange feeling/dysesthesia in head that is difficult for her to describe.   Hematological:  Does not bruise/bleed easily.  Psychiatric/Behavioral:  Negative for confusion.    Physical Exam Updated Vital Signs BP (!) 148/87 (BP Location: Right Arm)   Pulse 76   Temp 98.6 F (37 C) (Oral)   Resp 20   Ht 1.524 m (5')   Wt 59 kg   SpO2 100%  BMI 25.39 kg/m  Physical Exam Vitals and nursing note reviewed.  Constitutional:      Appearance: Normal appearance. She is well-developed.  HENT:     Head: Atraumatic.     Nose: Nose normal.     Mouth/Throat:     Mouth: Mucous membranes are moist.  Eyes:     General: No scleral icterus.    Conjunctiva/sclera: Conjunctivae normal.     Pupils: Pupils are equal, round, and reactive to light.  Neck:     Vascular: No carotid bruit.     Trachea: No tracheal deviation.  Cardiovascular:     Rate and Rhythm: Normal rate and regular rhythm.     Pulses: Normal pulses.     Heart sounds: Normal heart sounds. No murmur heard.   No friction  rub. No gallop.  Pulmonary:     Effort: Pulmonary effort is normal. No respiratory distress.     Breath sounds: Normal breath sounds.  Abdominal:     General: There is no distension.     Tenderness: There is no abdominal tenderness.  Genitourinary:    Comments: No cva tenderness.  Musculoskeletal:        General: No swelling or tenderness.     Cervical back: Normal range of motion and neck supple. No rigidity. No muscular tenderness.     Right lower leg: No edema.     Left lower leg: No edema.     Comments: No leg edema. Feet of normal color and warmth. Distal pulses palp bil.   Skin:    General: Skin is warm and dry.     Findings: No rash.  Neurological:     Mental Status: She is alert.     Comments: Alert, speech normal. No facial asymmetry or weakness noted. Motor fxn intact bil, str 5/5. No pronator drift. Sens grossly intact.   Psychiatric:        Mood and Affect: Mood normal.    ED Results / Procedures / Treatments   Labs (all labs ordered are listed, but only abnormal results are displayed) Results for orders placed or performed in visit on 07/20/21  CBC and differential  Result Value Ref Range   Hemoglobin 11.5 (A) 12.0 - 16.0   HCT 34 (A) 36 - 46   Neutrophils Absolute 2.86    Platelets 232 150 - 399   WBC 5.6   CBC  Result Value Ref Range   RBC 3.37 (A) 3.87 - 2.35  Basic metabolic panel  Result Value Ref Range   Glucose 111    BUN 30 (A) 4 - 21   CO2 28 (A) 13 - 22   Creatinine 1.0 0.5 - 1.1   Potassium 4.5 3.4 - 5.3   Sodium 139 137 - 147   Chloride 104 99 - 108  Comprehensive metabolic panel  Result Value Ref Range   Calcium 8.7 8.7 - 10.7   Albumin 4.0 3.5 - 5.0  Hepatic function panel  Result Value Ref Range   Alkaline Phosphatase 69 25 - 125   ALT 30 7 - 35   AST 33 13 - 35   Bilirubin, Total 0.3    MR BRAIN WO CONTRAST  Result Date: 10/17/2021 CLINICAL DATA:  Neuro deficit, acute, stroke suspected. Left-sided numbness. Headache. EXAM: MRI  HEAD WITHOUT CONTRAST TECHNIQUE: Multiplanar, multiecho pulse sequences of the brain and surrounding structures were obtained without intravenous contrast. COMPARISON:  02/09/2021 FINDINGS: Brain: Diffusion imaging does not show any acute or subacute infarction.  Brainstem and cerebellum are normal. Cerebral hemispheres show mild chronic small-vessel ischemic change of the white matter. 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 No large vessel or cortical infarction. No mass lesion, hemorrhage, hydrocephalus or extra-axial collection. Vascular: Major vessels at the base of the brain show flow. Skull and upper cervical spine: Negative Sinuses/Orbits: Clear except for a small retention cyst in the sphenoid sinus. Orbits negative. Other: None IMPRESSION: No acute finding to explain the clinical presentation. Mild chronic small-vessel ischemic change of the cerebral hemispheric white matter, similar to the prior examinations. Electronically Signed   By: Nelson Chimes M.D.   On: 10/17/2021 16:11     EKG None  Radiology No results found.  Procedures Procedures    Medications Ordered in ED Medications - No data to display  ED Course/ Medical Decision Making/ A&P                           Medical Decision Making Problems Addressed: Elevated blood pressure reading: acute illness or injury Numbness: acute illness or injury with systemic symptoms that poses a threat to life or bodily functions Paresthesia: acute illness or injury with systemic symptoms  Amount and/or Complexity of Data Reviewed Independent Historian: spouse    Details: hx External Data Reviewed: radiology and notes. Labs: ordered. Decision-making details documented in ED Course. Radiology: ordered and independent interpretation performed. Decision-making details documented in ED Course.  Risk OTC drugs. Decision regarding hospitalization.  Iv ns. Continuous pulse ox and cardiac monitoring. Labs  ordered/sent. Imaging ordered.   Differential dx includes cva, neuropathy, unspec paresthesias, electrolyte abn, etc. Disposition decision including potential need for admission if mri positive considered - will get labs and imaging and revisit dispo decision.   Reviewed nursing notes and prior charts for additional history. External reports reviewed. Additional history from: spouse.   Acetaminophen po.   Cardiac monitor: sinus rhythm, rate 76.  Labs reviewed/interpreted by me - pnd  MRI reviewed/interpreted by me - no cva.   Labs pending - signed out to Dr Melina Copa to check labs when resulted and dispo appropriately.          Final Clinical Impression(s) / ED Diagnoses Final diagnoses:  None    Rx / DC Orders ED Discharge Orders     None         Lajean Saver, MD 10/17/21 [id number]

## 2021-10-17 NOTE — ED Provider Notes (Signed)
Signout from Dr. Ashok Cordia.  61 year old female with intermittent bilateral lower extremity numbness and a squeezing sensation of her head with prior history of stroke.  Plan is to follow-up on MRI brain and lab work. Physical Exam  BP (!) 152/87   Pulse 69   Temp 98.1 F (36.7 C)   Resp 18   Ht 5' (1.524 m)   Wt 59 kg   SpO2 98%   BMI 25.39 kg/m   Physical Exam  Procedures  Procedures  ED Course / MDM    Medical Decision Making Amount and/or Complexity of Data Reviewed Labs: ordered. Radiology: ordered.   MRI does not show any acute findings.  Lab work fairly unremarkable.  Reviewed with the patient.  She already has a neurologist and recommended follow-up with them.  She is also concerned about some elevated blood pressure so recommended PCP follow-up.  Return instructions discussed       Hayden Rasmussen, MD 10/17/21 2316

## 2021-10-17 NOTE — Discharge Instructions (Addendum)
It was our pleasure to provide your ER care today - we hope that you feel better.  Follow up closely with primary care doctor/neurologist in the next 1-2 weeks. Also have blood pressure rechecked then, as it is high today.   Return to ER if worse, new symptoms, one-sided numbness/weakness, change in speech or vision, or other concern.

## 2021-10-17 NOTE — ED Notes (Signed)
To mri  pt alert no pain her bi-lateral feet numbness has gone  she reports that her bp is higher than normal  she is not on any medicine  husband is at  the bedside

## 2021-10-17 NOTE — Telephone Encounter (Signed)
Pt called stating that she had surgery on 10/11/21 but now she is starting to feel numbness and is wanting to discuss with RN or Provider.

## 2021-10-17 NOTE — ED Notes (Signed)
Pt remains in mri.

## 2021-10-17 NOTE — ED Notes (Signed)
The pt returning from mri reports that her  rt foot is starting to get numb at present bp a little lower  it is bouncing around

## 2021-10-17 NOTE — Telephone Encounter (Signed)
Contacted pt back, she stated she is having  Numbness in both feet, higher HR and headache since Monday. Currently having numbness in the bottom of L foot.  She had surgery on her R hip Thursday. Advised pt to contact physician who did her surgery first to make them aware or see if its anything on their end they could help with and give [place] a call back if they do not feel it is something they can handle. Also advised pt if symptoms worsen or have new stroke concerns, call 911 or go to ER. She verbally understood and was appreciative.  Pt did Mention stopping Atorvastatin on her on

## 2021-10-17 NOTE — ED Triage Notes (Signed)
Pt arrived POV from home c/o bilateral feet numbness since Sunday that comes and goes. Pt stated it went away last night and came back at 11am this morning. Pt has a hx of a stroke. Pt has no other neuro deficits.

## 2021-10-24 NOTE — Assessment & Plan Note (Addendum)
She was treated with B12 injections then transition to oral B12.  She is no longer taking B12 as her B12 level has been elevated at the wellness office.  On May 11, B12 was greater than 2000, iron studies and folate were normal.  She remains off B12

## 2021-10-24 NOTE — Progress Notes (Signed)
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  She has been on observation only. She has had stable bilateral cervical and inguinal lymphadenopathy.  MRI imaging from February 2023 revealed extensive inguinal and retroperitoneal lymphadenopathy.  Her physical exam reveals a modest change in her cervical and supraclavicular adenopathy. PET scan and March revealed continued stability of mild lymphadenopathy in the neck, chest, abdomen and pelvis with low level FDG and Deauville 2-3 category uptake.   She has stable small lymph nodes with borderline anemia.  I recommend we continue observation.  We will plan to see her back in 3 months with a CBC, comprehensive metabolic panel, LDH and CT chest, abdomen and pelvis to reestablish her disease baseline.  B12 deficiency anemia She was treated with B12 injections then transition to oral B12.  She is no longer taking B12 as her B12 level has been elevated at the wellness office.  On May 11, B12 was greater than 2000, iron studies and folate were normal.  She remains off B12   The patient understands the plans discussed today and is in agreement with them.  She knows to contact our office if she develops concerns prior to her next appointment.   I provided 15 minutes of face-to-face time during this encounter and > 50% was spent counseling as documented under my assessment and plan.    Marvia Pickles, PA-C  [facility] AT [facility] [address] [place] [address] Dept: [phone] Dept Fax: [phone]   Orders Placed This Encounter  Procedures   CBC and differential    This external order was  created through the Results Console.   CBC    This external order was created through the Results Console.   CBC    This order was created through External Result Entry   Basic metabolic panel    This external order was created through the Results Console.   Comprehensive metabolic panel    This external order was created through the Results Console.   Hepatic function panel    This external order was created through the Results Console.      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  Staging CT chest revealed adenopathy of the bilateral neck measuring up to 18 mm in diameter with left subclavian, bilateral axillary and subpectoral adenopathy, but no mediastinal nodes.  CT abdomen was  negative.  CT pelvis revealed bilateral external iliac nodes up to 11 mm in diameter.  She had B symptoms with severe night sweats and weight loss.  She has been on  observation only.  Due to her family history of breast cancer, she underwent testing for hereditary breast and ovarian cancer with the [facility] myRisk Hereditary Cancer Gene panel test.  This did not reveal any clinically significant mutation.  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  Her [facility] breast cancer risk assessment showed her lifetime risk of breast cancer to be 29.7%, so annual breast MRI, in addition to mammogram is  recommended.  Mammogram and MRI breast done in August 2019 did not reveal any evidence of malignancy.  She did go for a second opinion to [employer] regarding her lymphoma and they concurred with the approach of watchful waiting.    She presented to the emergency room in mid April 2021 due to increased urinary frequency and discomfort.  CT imaging revealed left sided obstructive uropathy with 3 mm  calculus in the distal left ureter just proximal to the ureterovesical unction causing moderate hydroureteronephrosis and perinephric stranding.  Left greater than right iliac and pelvic lymphadenopathy, slightly greater than on 10/26/18, consistent with known history of lymphoma.  We did not repeat a scan in June as scheduled.  Annual screening bilateral mammogram from August 2021 was clear.  MRI breast has not been repeated due to her comorbidities.   CT neck in May 2022 revealed multiple lymph nodes in the neck bilaterally, with mild progression of lymph nodes on the right. There has been more significant progression of left level 4 and left supraclavicular lymph nodes compared to the prior study. Largest lymph node in the left supraclavicular region measures 35 x 17 mm. Findings were compatible with lymphoma. CT chest/abdomen/pelvis revealed no substantial interval change in exam. Bilateral supraclavicular, subpectoral, axillary, retroperitoneal, and pelvic lymphadenopathy was similar to prior. There were no definite findings of progression.  At her visit in September, the lymphoma remained stable. She continued to have sciatic pain unrelieved with gabapentin 800 mg TID. She had steroid injections of the spine with improvement.    Bilateral screening mammogram in January revealed a possible asymmetry in the left breast.  She underwent left diagnostic mammogram and the asymmetry disperses with compression, so it is felt to be dense fibroglandular tissue.  There is no evidence of malignancy.  Bilateral screening in 1 year was recommended.  MRI of the right hip from February 2023 revealed extensive inguinal and retroperitoneal lymphadenopathy. There are high-grade tears of the right gluteus numbness greater than medius with background tendinosis and trochanteric bursitis, and bilateral hamstring origin tendinosis with mild partial tearing. She has mild bilateral hip osteoarthritis, and  right sided labral tear.  She states that she has chronic palpable lymph nodes of the left neck and supraclavicular area.  She therefore underwent PET scan in March, which revealed stable disease when compared to previous CT images.   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[place])  11/11/2015 Cancer Staging   Staging form: Hodgkin and Non-Hodgkin Lymphoma, AJCC 8th Edition - Clinical stage from 11/11/2015: Stage III (Small lymphocytic leukemia) - Signed by Derwood Kaplan, MD on 01/28/2021 Histopathologic type: Malignant lymphoma, small B lymphocytic, NOS (see also M-9823/3) Stage prefix: Initial diagnosis Diagnostic confirmation: Positive histology PLUS positive immunophenotyping and/or positive genetic studies Specimen type: Core Needle Biopsy Staged by: Managing physician Stage used in treatment planning: Yes National guidelines used in treatment planning: Yes Type of national guideline used in treatment planning: NCCN Staging comments: Watchful waiting    06/20/2020 Initial Diagnosis   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)    Chronic lymphocytic leukemia (CLL), B-cell ([place])  10/31/2015 Initial Diagnosis   Chronic lymphocytic leukemia (CLL), B-cell ([place])    11/11/2015 Cancer Staging   Staging form: Chronic Lymphocytic Leukemia / Small Lymphocytic Lymphoma, AJCC 8th Edition - Clinical stage from 11/11/2015: Modified Rai Stage III (Modified Rai risk: High, Binet: Stage B, Lugano: Stage III, Lymphocytosis: Absent, Adenopathy: Present, Organomegaly: Absent, Anemia: Absent, Thrombocytopenia: Absent) -  Signed by Derwood Kaplan, MD on 04/05/2021 Histopathologic type: B-cell lymphocytic leukemia/small lymphocytic lymphoma (see also M-9670/3) Stage prefix: Initial diagnosis Stage used in treatment planning: Yes National guidelines used in treatment planning: Yes Type of national guideline used in treatment planning: NCCN        INTERVAL HISTORY:   Dashanique is here today for repeat clinical assessment and states she has been doing fairly well.  She reports fatigue.  She has had chronic night sweats, as well as some hot flashes during the day.  She denies fever, chills, appetite changes, weight loss or abdominal pain.  She reports stable palpable adenopathy.  She had surgery of the right hip for a torn tendon nearly 3 weeks ago.  This resulted in improvement in her hip pain.  She is starting to bear weight and is scheduled with PT next week.  Her weight has been stable.  She was in the emergency room at [facility] last week with paresthesias of the bilateral legs.  Her blood pressure was mildly elevated, so she was started back on ramipril 1.25 mg daily.  She states the paresthesias have completely resolved.  She has persistent mild chronic swelling of the left lower extremity.  She sees a Brewing technologist and is taking powdered supplements daily.  She was also started on over-the-counter thyrocsin daily as her TSH was mildly elevated and reverse T3 was low.  She saw Dr. Laqueta Due last week and she increased the thyrocsin to twice a day.  She states she is up-to-date on mammogram and colonoscopy.  REVIEW OF SYSTEMS:  Review of Systems  Constitutional:  Negative for appetite change, chills, fatigue, fever and unexpected weight change.  HENT:   Negative for lump/mass, mouth sores and sore throat.   Respiratory:  Negative for cough and shortness of breath.   Cardiovascular:  Negative for chest pain and leg swelling.  Gastrointestinal:  Negative for abdominal pain, constipation, diarrhea, nausea and vomiting.  Endocrine: Negative for hot flashes.  Genitourinary:  Negative for difficulty urinating, dysuria, frequency and hematuria.   Musculoskeletal:  Negative for arthralgias, back pain and myalgias.  Skin:  Negative for rash.  Neurological:  Negative for dizziness and headaches.  Hematological:  Negative for adenopathy. Does not bruise/bleed easily.   Psychiatric/Behavioral:  Negative for depression and sleep disturbance. The patient is not nervous/anxious.     VITALS:  Blood pressure 120/75, pulse 95, temperature 98.5 F (36.9 C), temperature source Oral, resp. rate 18, height 5' (1.524 m), weight 130 lb 1.6 oz (59 kg), SpO2 93 %.  Wt Readings from Last 3 Encounters:  10/30/21 130 lb 1.6 oz (59 kg)  10/17/21 130 lb (59 kg)  07/30/21 127 lb (57.6 kg)    Body mass index is 25.41 kg/m.  Performance status (ECOG): 2 - Symptomatic, <50% confined to bed  PHYSICAL EXAM:  Physical Exam Vitals and nursing note reviewed.  Constitutional:      General: She is not in acute distress.    Appearance: Normal appearance.  HENT:     Head: Normocephalic and atraumatic.     Mouth/Throat:     Mouth: Mucous membranes are moist.     Pharynx: Oropharynx is clear. No oropharyngeal exudate or posterior oropharyngeal erythema.  Eyes:     General: No scleral icterus.    Extraocular Movements: Extraocular movements intact.     Conjunctiva/sclera: Conjunctivae normal.     Pupils: Pupils are equal, round, and reactive to light.  Cardiovascular:     Rate  and Rhythm: Normal rate and regular rhythm.     Heart sounds: Normal heart sounds. No murmur heard.   No friction rub. No gallop.  Pulmonary:     Effort: Pulmonary effort is normal.     Breath sounds: Normal breath sounds. No wheezing, rhonchi or rales.  Abdominal:     General: There is no distension.     Palpations: Abdomen is soft. There is no hepatomegaly, splenomegaly or mass.     Tenderness: There is no abdominal tenderness.  Musculoskeletal:        General: Normal range of motion.     Cervical back: Normal range of motion and neck supple. No tenderness.     Right lower leg: No edema.     Left lower leg: Edema (1+) present.  Lymphadenopathy:     Cervical: Cervical adenopathy present.     Upper Body:     Right upper body: No supraclavicular or axillary adenopathy.     Left upper body: No  supraclavicular or axillary adenopathy.     Lower Body: No right inguinal adenopathy. Left inguinal adenopathy present.     Comments: There is a 1 to 2 cm left posterior cervical lymph node and a 1 cm left anterior cervical lymph node.  There is a 1 cm right anterior lymph node.  There is a 1 cm left inguinal lymph node.  I cannot palpate any supraclavicular or or axillary adenopathy.  Skin:    General: Skin is warm and dry.     Coloration: Skin is not jaundiced.     Findings: No rash.  Neurological:     Mental Status: She is alert and oriented to person, place, and time.     Cranial Nerves: No cranial nerve deficit.  Psychiatric:        Mood and Affect: Mood normal.        Behavior: Behavior normal.        Thought Content: Thought content normal.    LABS:      Latest Ref Rng & Units 10/30/2021   12:00 AM 10/17/2021    5:11 PM 07/20/2021   12:00 AM  CBC  WBC  4.1      6.4   5.6    Hemoglobin 12.0 - 16.0 11.8      12.6   11.5    Hematocrit 36 - 46 33      38.5   34    Platelets 150 - 400 K/uL 218      234   232       This result is from an external source.      Latest Ref Rng & Units 10/30/2021   12:00 AM 10/17/2021    5:11 PM 07/20/2021   12:00 AM  CMP  Glucose 70 - 99 mg/dL  113     BUN 4 - 21 26      18   30     Creatinine 0.5 - 1.1 0.7      0.74   1.0    Sodium 137 - 147 139      140   139    Potassium 3.5 - 5.1 mEq/L 4.3      4.5   4.5    Chloride 99 - 108 101      106   104    CO2 13 - 22 28      28   28     Calcium 8.7 - 10.7 9.2  9.4   8.7    Alkaline Phos 25 - 125 56       69    AST 13 - 35 31       33    ALT 7 - 35 U/L 28       30       This result is from an external source.     No results found for: CEA1 / No results found for: CEA1 No results found for: PSA1 No results found for: DEY814 No results found for: CAN125  No results found for: Ronnald Ramp, A1GS, A2GS, BETS, BETA2SER, GAMS, MSPIKE, SPEI Lab Results  Component Value Date   TIBC  331 07/23/2021   FERRITIN 22 07/23/2021   IRONPCTSAT 20 07/23/2021   Lab Results  Component Value Date   LDH 136 10/30/2021   LDH 142 07/20/2021   LDH 147 04/26/2021    STUDIES:  MR BRAIN WO CONTRAST  Result Date: 10/17/2021 CLINICAL DATA:  Neuro deficit, acute, stroke suspected. Left-sided numbness. Headache. EXAM: MRI HEAD WITHOUT CONTRAST TECHNIQUE: Multiplanar, multiecho pulse sequences of the brain and surrounding structures were obtained without intravenous contrast. COMPARISON:  02/09/2021 FINDINGS: Brain: Diffusion imaging does not show any acute or subacute infarction. Brainstem and cerebellum are normal. Cerebral hemispheres show mild chronic small-vessel ischemic change of the white matter. 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 No large vessel or cortical infarction. No mass lesion, hemorrhage, hydrocephalus or extra-axial collection. Vascular: Major vessels at the base of the brain show flow. Skull and upper cervical spine: Negative Sinuses/Orbits: Clear except for a small retention cyst in the sphenoid sinus. Orbits negative. Other: None IMPRESSION: No acute finding to explain the clinical presentation. Mild chronic small-vessel ischemic change of the cerebral hemispheric white matter, similar to the prior examinations. Electronically Signed   By: Nelson Chimes M.D.   On: 10/17/2021 16:11      HISTORY:   Past Medical History:  Diagnosis Date   Allergy    seasonal   Arthritis    Bipolar 1 disorder ([place])    Cancer (HCC)    non hodgkins lymphoma   Depression    GERD (gastroesophageal reflux disease)    History of degenerative disc disease    Hyperlipidemia    Increased risk of breast cancer 04/25/2021   Obstructive sleep apnea    Osteopenia    Scoliosis    Thyroid disease    hypothyroidism   Vitamin D deficiency     Past Surgical History:  Procedure Laterality Date   BREAST BIOPSY     BUBBLE STUDY  02/13/2021   Procedure: BUBBLE STUDY;   Surgeon: Skeet Latch, MD;  Location: [facility];  Service: Cardiovascular;;   CARPAL TUNNEL RELEASE Bilateral 2002   CESAREAN SECTION     x2   COLONOSCOPY  07/26/2008   Melanosis coli. Small internal hemorrhoids.    ENDOSCOPIC PLANTAR FASCIOTOMY     ESOPHAGOGASTRODUODENOSCOPY  03/17/2013   Mild gastritis. Status post esophageal dilatation.   LYMPH NODE BIOPSY     right hip repair torn tendon Right 09/2021   TEE WITHOUT CARDIOVERSION N/A 02/13/2021   Procedure: TRANSESOPHAGEAL ECHOCARDIOGRAM (TEE);  Surgeon: Skeet Latch, MD;  [facility] ENDOSCOPY;  Service: Cardiovascular;  Laterality: N/A;   WISDOM TOOTH EXTRACTION      Family History  Problem Relation Age of Onset   Prostate cancer Father 34   Melanoma Father 43   High blood pressure Father    Breast cancer Maternal  Aunt    Multiple myeloma Maternal Aunt    Breast cancer Maternal Aunt    Colon cancer Neg Hx    Colon polyps Neg Hx    Esophageal cancer Neg Hx    Rectal cancer Neg Hx    Stomach cancer Neg Hx     Social History:  reports that she has never smoked. She has never used smokeless tobacco. She reports that she does not currently use alcohol. She reports that she does not use drugs.The patient is accompanied by her husband today.  Allergies:  Allergies  Allergen Reactions   Voltaren [Diclofenac Sodium]     Thought it had something to do with her having a stroke   Nsaids     Had a stroke and worries it was related to the Diclofenac she took.    Current Medications: Current Outpatient Medications  Medication Sig Dispense Refill   ramipril (ALTACE) 1.25 MG capsule Take 1.25 mg by mouth daily.     ascorbic acid (VITAMIN C) 500 MG tablet Take 1 tablet by mouth daily.     aspirin EC 81 MG tablet Take 1 tablet (81 mg total) by mouth daily. 30 tablet 2   Ca [id number] (CALCIUM-FOLIC ACID PLUS D) 3112-1 MG WAFR      chlorpheniramine (CHLOR-TRIMETON) 4 MG tablet Take 4 mg by mouth daily as  needed for allergies.     Cholecalciferol (VITAMIN D3) 10 MCG (400 UNIT) tablet      ELDERBERRY PO Take 1 tablet by mouth daily.     FLUoxetine (PROZAC) 20 MG capsule Take 60 mg by mouth daily.     Misc Natural Products (ELDERBERRY ZINC/VIT C/IMMUNE) LOZG      Multiple Vitamin (MULTIVITAMIN ADULT PO) Take by mouth.     Omega 3 1000 MG CAPS Take 1,000 mg by mouth daily.     ondansetron (ZOFRAN) 4 MG tablet Take 1 tablet (4 mg total) by mouth every 8 (eight) hours as needed for nausea or vomiting. 20 tablet 0   Probiotic Product (PROBIOTIC BLEND PO) Take 1 capsule by mouth daily.     QUEtiapine (SEROQUEL) 300 MG tablet Take 300 mg by mouth at bedtime.     zinc gluconate 50 MG tablet Take 50 mg by mouth as needed.     No current facility-administered medications for this visit.

## 2021-10-24 NOTE — Assessment & Plan Note (Addendum)
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 She has been on observation only. She has had stable bilateral cervical and inguinal lymphadenopathy. MRI imaging from February 2023 revealed extensive inguinal and retroperitoneal lymphadenopathy.  Her physical exam reveals a modest change in her cervical and supraclavicular adenopathy. PET scan and March revealed continued stability of mild lymphadenopathy in the neck, chest, abdomen and pelvis with low level FDG and Deauville 2-3 category uptake.   She has stable small lymph nodes with borderline anemia.  I recommend we continue observation.  We will plan to see her back in 3 months with a CBC, comprehensive metabolic panel, LDH and CT chest, abdomen and pelvis to reestablish her disease baseline.

## 2021-10-25 ENCOUNTER — Other Ambulatory Visit: Payer: Self-pay

## 2021-10-30 ENCOUNTER — Encounter: Payer: Self-pay | Admitting: Hematology and Oncology

## 2021-10-30 ENCOUNTER — Inpatient Hospital Stay (INDEPENDENT_AMBULATORY_CARE_PROVIDER_SITE_OTHER): Payer: Managed Care, Other (non HMO) | Admitting: Hematology and Oncology

## 2021-10-30 ENCOUNTER — Inpatient Hospital Stay: Payer: Managed Care, Other (non HMO) | Attending: Oncology

## 2021-10-30 ENCOUNTER — Ambulatory Visit: Payer: Managed Care, Other (non HMO) | Admitting: Oncology

## 2021-10-30 DIAGNOSIS — Z8572 Personal history of non-Hodgkin lymphomas: Secondary | ICD-10-CM | POA: Insufficient documentation

## 2021-10-30 DIAGNOSIS — C8302 Small cell B-cell lymphoma, intrathoracic lymph nodes: Secondary | ICD-10-CM | POA: Diagnosis not present

## 2021-10-30 DIAGNOSIS — Z79899 Other long term (current) drug therapy: Secondary | ICD-10-CM | POA: Diagnosis not present

## 2021-10-30 DIAGNOSIS — D519 Vitamin B12 deficiency anemia, unspecified: Secondary | ICD-10-CM

## 2021-10-30 LAB — HEPATIC FUNCTION PANEL
ALT: 28 U/L (ref 7–35)
AST: 31 (ref 13–35)
Alkaline Phosphatase: 56 (ref 25–125)
Bilirubin, Total: 0.4

## 2021-10-30 LAB — CBC
MCV: 101 — AB (ref 81–99)
RBC: 3.59 — AB (ref 3.87–5.11)

## 2021-10-30 LAB — BASIC METABOLIC PANEL
BUN: 26 — AB (ref 4–21)
CO2: 28 — AB (ref 13–22)
Chloride: 101 (ref 99–108)
Creatinine: 0.7 (ref 0.5–1.1)
Glucose: 101
Potassium: 4.3 mEq/L (ref 3.5–5.1)
Sodium: 139 (ref 137–147)

## 2021-10-30 LAB — LACTATE DEHYDROGENASE: LDH: 136 U/L (ref 98–192)

## 2021-10-30 LAB — CBC AND DIFFERENTIAL
HCT: 33 — AB (ref 36–46)
Hemoglobin: 11.8 — AB (ref 12.0–16.0)
Neutrophils Absolute: 1.85
Platelets: 218 10*3/uL (ref 150–400)
WBC: 4.1

## 2021-10-30 LAB — COMPREHENSIVE METABOLIC PANEL
Albumin: 4 (ref 3.5–5.0)
Calcium: 9.2 (ref 8.7–10.7)

## 2021-10-30 LAB — VITAMIN B12: Vitamin B-12: 1018 pg/mL — ABNORMAL HIGH (ref 180–914)

## 2021-12-04 DIAGNOSIS — M67951 Unspecified disorder of synovium and tendon, right thigh: Secondary | ICD-10-CM | POA: Diagnosis not present

## 2021-12-04 DIAGNOSIS — M25551 Pain in right hip: Secondary | ICD-10-CM | POA: Diagnosis not present

## 2021-12-10 DIAGNOSIS — M25551 Pain in right hip: Secondary | ICD-10-CM | POA: Diagnosis not present

## 2021-12-10 DIAGNOSIS — M67951 Unspecified disorder of synovium and tendon, right thigh: Secondary | ICD-10-CM | POA: Diagnosis not present

## 2021-12-17 DIAGNOSIS — M25551 Pain in right hip: Secondary | ICD-10-CM | POA: Diagnosis not present

## 2021-12-17 DIAGNOSIS — M67951 Unspecified disorder of synovium and tendon, right thigh: Secondary | ICD-10-CM | POA: Diagnosis not present

## 2022-01-07 ENCOUNTER — Telehealth: Payer: Self-pay

## 2022-01-07 ENCOUNTER — Other Ambulatory Visit: Payer: Self-pay | Admitting: Hematology and Oncology

## 2022-01-07 MED ORDER — PAXLOVID (150/100) 10 X 150 MG & 10 X 100MG PO TBPK: 1.0000 | ORAL_TABLET | Freq: Two times a day (BID) | ORAL | 0 refills | Status: DC

## 2022-01-07 NOTE — Telephone Encounter (Addendum)
Pt just returned from [place] last week. Her husband tested positive first. She tested positive yesterday. Her symptoms are sore throat, head congestion. Afebrile. Asking if she can take Paxlovid?

## 2022-01-22 ENCOUNTER — Telehealth: Payer: Self-pay | Admitting: Oncology

## 2022-01-22 ENCOUNTER — Other Ambulatory Visit: Payer: Self-pay | Admitting: Hematology and Oncology

## 2022-01-22 DIAGNOSIS — C8302 Small cell B-cell lymphoma, intrathoracic lymph nodes: Secondary | ICD-10-CM

## 2022-01-22 NOTE — Telephone Encounter (Signed)
Contacted pt to notify her of upcoming scan appt. Unable to reach via phone, voicemail box unavailable.

## 2022-01-23 DIAGNOSIS — M67951 Unspecified disorder of synovium and tendon, right thigh: Secondary | ICD-10-CM | POA: Diagnosis not present

## 2022-01-23 DIAGNOSIS — M25551 Pain in right hip: Secondary | ICD-10-CM | POA: Diagnosis not present

## 2022-01-29 ENCOUNTER — Encounter: Payer: Self-pay | Admitting: Oncology

## 2022-01-29 DIAGNOSIS — C8302 Small cell B-cell lymphoma, intrathoracic lymph nodes: Secondary | ICD-10-CM | POA: Diagnosis not present

## 2022-01-29 DIAGNOSIS — M4186 Other forms of scoliosis, lumbar region: Secondary | ICD-10-CM | POA: Diagnosis not present

## 2022-01-29 DIAGNOSIS — C8510 Unspecified B-cell lymphoma, unspecified site: Secondary | ICD-10-CM | POA: Diagnosis not present

## 2022-01-29 DIAGNOSIS — R590 Localized enlarged lymph nodes: Secondary | ICD-10-CM | POA: Diagnosis not present

## 2022-01-29 LAB — HEPATIC FUNCTION PANEL
ALT: 26 U/L (ref 7–35)
ALT: 26 U/L (ref 7–35)
AST: 22 (ref 13–35)
AST: 22 (ref 13–35)
Alkaline Phosphatase: 63 (ref 25–125)
Alkaline Phosphatase: 63 (ref 25–125)
Bilirubin, Total: 0.4
Bilirubin, Total: 0.4

## 2022-01-29 LAB — BASIC METABOLIC PANEL
BUN: 20 (ref 4–21)
BUN: 20 (ref 4–21)
CO2: 30 — AB (ref 13–22)
CO2: 30 — AB (ref 13–22)
Chloride: 100 (ref 99–108)
Chloride: 100 (ref 99–108)
Creatinine: 0.8 (ref 0.5–1.1)
Creatinine: 0.8 (ref 0.5–1.1)
Glucose: 98
Glucose: 98
Potassium: 4.8 mEq/L (ref 3.5–5.1)
Potassium: 4.8 mEq/L (ref 3.5–5.1)
Sodium: 138 (ref 137–147)
Sodium: 138 (ref 137–147)

## 2022-01-29 LAB — COMPREHENSIVE METABOLIC PANEL
Albumin: 3.9 (ref 3.5–5.0)
Albumin: 3.9 (ref 3.5–5.0)
Calcium: 9.1 (ref 8.7–10.7)
Calcium: 9.1 (ref 8.7–10.7)

## 2022-01-29 LAB — CBC AND DIFFERENTIAL
HCT: 37 (ref 36–46)
HCT: 37 (ref 36–46)
Hemoglobin: 12.3 (ref 12.0–16.0)
Hemoglobin: 12.3 (ref 12.0–16.0)
Neutrophils Absolute: 1.66
Neutrophils Absolute: 1.66
Platelets: 219 10*3/uL (ref 150–400)
Platelets: 219 10*3/uL (ref 150–400)
WBC: 3.6
WBC: 3.6

## 2022-01-29 LAB — CBC
RBC: 3.64 — AB (ref 3.87–5.11)
RBC: 3.64 — AB (ref 3.87–5.11)

## 2022-01-30 ENCOUNTER — Telehealth: Payer: Self-pay | Admitting: Oncology

## 2022-01-30 ENCOUNTER — Encounter: Payer: Self-pay | Admitting: Oncology

## 2022-01-30 ENCOUNTER — Inpatient Hospital Stay: Payer: BC Managed Care – PPO | Attending: Oncology | Admitting: Oncology

## 2022-01-30 ENCOUNTER — Other Ambulatory Visit: Payer: Self-pay | Admitting: Oncology

## 2022-01-30 VITALS — BP 136/77 | HR 101 | Temp 97.9°F | Resp 16 | Ht 60.0 in | Wt 131.3 lb

## 2022-01-30 DIAGNOSIS — C8302 Small cell B-cell lymphoma, intrathoracic lymph nodes: Secondary | ICD-10-CM

## 2022-01-30 DIAGNOSIS — D519 Vitamin B12 deficiency anemia, unspecified: Secondary | ICD-10-CM

## 2022-01-30 NOTE — Telephone Encounter (Signed)
01/30/22 NEXT APPT SCHEDULED AND CONFIRMED WITH PATIENT

## 2022-01-30 NOTE — Progress Notes (Signed)
Dawn Prince  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  She has been on observation only. She has had stable bilateral cervical and inguinal lymphadenopathy.  MRI imaging from February 2023 revealed extensive inguinal and retroperitoneal lymphadenopathy.  Her physical exam reveals a modest change in her cervical and supraclavicular adenopathy. PET scan in March revealed continued stability of mild lymphadenopathy in the neck, chest, abdomen and pelvis with low level FDG and [place]ville 2-3 category uptake.    B12 deficiency anemia She was treated with B12 injections then transition to oral B12.  She is no longer taking B12 as her B12 level has been elevated at the wellness office.  On May 11, B12 was greater than 2000, iron studies and folate were normal.  She remains off B12      Overall she continues to have stable disease and we will continue observation only.  Her scan from September 5 really shows no change in the adenopathy throughout the chest, abdomen and pelvis.  I will see her back in 3 months with CBC, CMP and B12 level.  I believe she will then be about due for her annual screening mammography.  The patient and her husband understand the plans discussed today and are in agreement with them.  She knows to contact our office if she develops concerns prior to her next appointment.   I provided 30 minutes of face-to-face time during this encounter and > 50% was spent counseling as documented under my assessment and plan.    Derwood Kaplan, MD  [facility] AT [facility] [address] [place] [address] Dept: [phone] Dept Fax: [phone]    Orders Placed This Encounter  Procedures   CBC and differential    This external order was created through the Results Console.   CBC    This external order was created through the Results Console.   Basic metabolic panel    This external order was created through the Results Console.   Comprehensive metabolic panel    This external order was created through the Results Console.   Hepatic function panel    This external order was created through the Results Console.      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  Staging CT chest revealed adenopathy of the bilateral neck measuring up to 18 mm in diameter with left subclavian, bilateral axillary and subpectoral adenopathy, but no mediastinal nodes.  CT abdomen was  negative.  CT pelvis revealed bilateral external iliac nodes up to 11 mm in diameter.  She had B symptoms with severe night sweats and weight loss.  She has been on  observation only.  Due to her family history of breast cancer, she underwent testing for hereditary breast and ovarian cancer with the [employer] myRisk Hereditary Cancer Gene panel test.  This did not reveal any clinically significant mutation.  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  Her [facility] breast cancer risk assessment showed her lifetime risk of breast cancer to be  29.7%, so annual breast MRI, in addition to mammogram is recommended.  Mammogram and MRI breast done in August 2019 did not reveal any evidence of malignancy.  She did go for a second opinion to [facility] regarding her lymphoma and they concurred with the approach of watchful waiting.    She presented to the emergency room in mid April 2021 due to increased urinary frequency and discomfort.  CT imaging revealed left sided  obstructive uropathy with 3 mm calculus in the distal left ureter just proximal to the ureterovesical unction causing moderate hydroureteronephrosis and perinephric stranding.  Left greater than right iliac and pelvic lymphadenopathy, slightly greater than on 10/26/18, consistent with known history of lymphoma.  We did not repeat a scan in June as scheduled.  Annual screening bilateral mammogram from August 2021 was clear.  MRI breast has not been repeated due to her comorbidities.   CT neck in May 2022 revealed multiple lymph nodes in the neck bilaterally, with mild progression of lymph nodes on the right. There has been more significant progression of left level 4 and left supraclavicular lymph nodes compared to the prior study. Largest lymph node in the left supraclavicular region measures 35 x 17 mm. Findings were compatible with lymphoma. CT chest/abdomen/pelvis revealed no substantial interval change in exam. Bilateral supraclavicular, subpectoral, axillary, retroperitoneal, and pelvic lymphadenopathy was similar to prior. There were no definite findings of progression.  At her visit in September, the lymphoma remained stable. She continued to have sciatic pain unrelieved with gabapentin 800 mg TID. She had steroid injections of the spine with improvement.    Bilateral screening mammogram in January revealed a possible asymmetry in the left breast.  She underwent left diagnostic mammogram and the asymmetry disperses with compression, so it is felt to be dense fibroglandular tissue.  There is no evidence of malignancy.  Bilateral screening in 1 year was recommended.  MRI of the right hip from February 2023 revealed extensive inguinal and retroperitoneal lymphadenopathy. There are high-grade tears of the right gluteus numbness greater than medius with background tendinosis and trochanteric bursitis, and bilateral hamstring origin tendinosis with mild partial tearing. She has mild bilateral hip  osteoarthritis, and  right sided labral tear. She states that she has chronic palpable lymph nodes of the left neck and supraclavicular area.  She therefore underwent PET scan in March, which revealed stable disease when compared to previous CT images.   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[place])  11/11/2015 Cancer Staging   Staging form: Hodgkin and Non-Hodgkin Lymphoma, AJCC 8th Edition - Clinical stage from 11/11/2015: Stage III (Small lymphocytic leukemia) - Signed by Derwood Kaplan, MD on 01/28/2021 Histopathologic type: Malignant lymphoma, small B lymphocytic, NOS (see also M-9823/3) Stage prefix: Initial diagnosis Diagnostic confirmation: Positive histology PLUS positive immunophenotyping and/or positive genetic studies Specimen type: Core Needle Biopsy Staged by: Managing physician Stage used in treatment planning: Yes National guidelines used in treatment planning: Yes Type of national guideline used in treatment planning: NCCN Staging comments: Watchful waiting   06/20/2020 Initial Diagnosis   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)   Chronic lymphocytic leukemia (CLL), B-cell ([place])  10/31/2015 Initial Diagnosis   Chronic lymphocytic leukemia (CLL), B-cell ([place])   11/11/2015 Cancer Staging   Staging form: Chronic Lymphocytic Leukemia / Small Lymphocytic Lymphoma, AJCC 8th Edition - Clinical stage from 11/11/2015: Modified Rai Stage III (Modified Rai risk: High, Binet: Stage B, Lugano: Stage III, Lymphocytosis: Absent, Adenopathy:  Present, Organomegaly: Absent, Anemia: Absent, Thrombocytopenia: Absent) - Signed by Derwood Kaplan, MD on 04/05/2021 Histopathologic type: B-cell lymphocytic leukemia/small lymphocytic lymphoma (see also M-9670/3) Stage prefix: Initial diagnosis Stage used in treatment planning: Yes National guidelines used in treatment planning: Yes Type of national guideline used in treatment planning:  NCCN       INTERVAL HISTORY:  Dawn Prince is here today for repeat clinical assessment and states she has been doing fairly well.  She reports fatigue.  She has had chronic night sweats, as well as some hot flashes during the day.  She has had a repeat staging with CT scans of chest, abdomen and pelvis.  This does show numerous enlarged lymphadenopathy throughout the chest, abdomen and pelvis, but unchanged.  Her physical exam shows just minor changes and overall her disease appears to remain under control.  She denies fever, chills, appetite changes, weight loss or abdominal pain.  She reports stable palpable adenopathy.  She had surgery of the right hip for a torn tendon nearly earlier this year.  She was also started on over-the-counter thyrocsin daily as her TSH was mildly elevated and reverse T3 was low.  She saw Dr. Laqueta Due for follow-up and she increased the thyroxine to twice a day.  She states she is up-to-date on mammogram and colonoscopy.  REVIEW OF SYSTEMS:  Review of Systems  Constitutional:  Negative for appetite change, chills, fatigue, fever and unexpected weight change.  HENT:   Negative for lump/mass, mouth sores and sore throat.   Respiratory:  Negative for cough and shortness of breath.   Cardiovascular:  Negative for chest pain and leg swelling.  Gastrointestinal:  Negative for abdominal pain, constipation, diarrhea, nausea and vomiting.  Endocrine: Negative for hot flashes.  Genitourinary:  Negative for difficulty urinating, dysuria, frequency and hematuria.   Musculoskeletal:  Negative for arthralgias, back pain and myalgias.  Skin:  Negative for rash.  Neurological:  Negative for dizziness and headaches.  Hematological:  Negative for adenopathy. Does not bruise/bleed easily.  Psychiatric/Behavioral:  Negative for depression and sleep disturbance. The patient is not nervous/anxious.      VITALS:  Blood pressure 136/77, pulse (!) 101, temperature 97.9 F (36.6 C),  temperature source Oral, resp. rate 16, height 5' (1.524 m), weight 131 lb 4.8 oz (59.6 kg), SpO2 95 %.  Wt Readings from Last 3 Encounters:  01/30/22 131 lb 4.8 oz (59.6 kg)  10/30/21 130 lb 1.6 oz (59 kg)  10/17/21 130 lb (59 kg)    Body mass index is 25.64 kg/m.  Performance status (ECOG): 2 - Symptomatic, <50% confined to bed  PHYSICAL EXAM:  Physical Exam Vitals and nursing note reviewed.  Constitutional:      General: She is not in acute distress.    Appearance: Normal appearance.  HENT:     Head: Normocephalic and atraumatic.     Mouth/Throat:     Mouth: Mucous membranes are moist.     Pharynx: Oropharynx is clear. No oropharyngeal exudate or posterior oropharyngeal erythema.  Eyes:     General: No scleral icterus.    Extraocular Movements: Extraocular movements intact.     Conjunctiva/sclera: Conjunctivae normal.     Pupils: Pupils are equal, round, and reactive to light.  Cardiovascular:     Rate and Rhythm: Normal rate and regular rhythm.     Heart sounds: Normal heart sounds. No murmur heard.    No friction rub. No gallop.  Pulmonary:     Effort: Pulmonary effort is normal.  Breath sounds: Normal breath sounds. No wheezing, rhonchi or rales.  Abdominal:     General: There is no distension.     Palpations: Abdomen is soft. There is no hepatomegaly, splenomegaly or mass.     Tenderness: There is no abdominal tenderness.  Musculoskeletal:        General: Normal range of motion.     Cervical back: Normal range of motion and neck supple. No tenderness.     Right lower leg: No edema.     Left lower leg: Edema (1+) present.  Lymphadenopathy:     Cervical: Cervical adenopathy present.     Upper Body:     Right upper body: No supraclavicular or axillary adenopathy.     Left upper body: No supraclavicular or axillary adenopathy.     Lower Body: No right inguinal adenopathy. Left inguinal adenopathy present.     Comments: There are minor changes in her adenopathy.   She has a left posterior cervical node measuring 2 cm and the left axillary node has increased to 2 to 3 cm.  The left inguinal node measures about 2 cm.  No other significant adenopathy.  Skin:    General: Skin is warm and dry.     Coloration: Skin is not jaundiced.     Findings: No rash.  Neurological:     Mental Status: She is alert and oriented to person, place, and time.     Cranial Nerves: No cranial nerve deficit.  Psychiatric:        Mood and Affect: Mood normal.        Behavior: Behavior normal.        Thought Content: Thought content normal.    LABS:      Latest Ref Rng & Units 01/29/2022   12:00 AM 10/30/2021   12:00 AM 10/17/2021    5:11 PM  CBC  WBC  3.6       3.6     4.1     6.4   Hemoglobin 12.0 - 16.0 12.0 - 16.0 12.3       12.3     11.8     12.6   Hematocrit 36 - 46 36 - 46 37       37     33     38.5   Platelets 150 - 400 K/uL 150 - 400 K/uL 219       219     218     234      This result is from an external source.   Multiple values from one day are sorted in reverse-chronological order      Latest Ref Rng & Units 01/29/2022   12:00 AM 10/30/2021   12:00 AM 10/17/2021    5:11 PM  CMP  Glucose 70 - 99 mg/dL   113   BUN 4 - 21 4 - _0 Creatinine 0.5 - 1.1 0.5 - 1.1 0.8       0.8     0.7     0.74   Sodium 137 - 147 137 - 147 138       138     139     140   Potassium 3.5 - 5.1 mEq/L 3.5 - 5.1 mEq/L 4.8       4.8     4.3     4.5   Chloride 99 -  108 99 - 108 100       100     101     106   CO2 13 - 22 13 - _0 Calcium 8.7 - 10.7 8.7 - 10.7 9.1       9.1     9.2     9.4   Alkaline Phos 25 - 125 25 - 125 63       63     56       AST 13 - 35 13 - 35 _1 ALT 7 - 35 U/L 7 - 35 U/L _2 This result is from an external source.   Multiple values from one day are sorted in reverse-chronological order     No results found for: "CEA1", "CEA" / No  results found for: "CEA1", "CEA" No results found for: "PSA1" No results found for: "PTW656" No results found for: "CAN125"  No results found for: "TOTALPROTELP", "ALBUMINELP", "A1GS", "A2GS", "BETS", "BETA2SER", "GAMS", "MSPIKE", "SPEI" Lab Results  Component Value Date   TIBC 331 07/23/2021   FERRITIN 22 07/23/2021   IRONPCTSAT 20 07/23/2021   Lab Results  Component Value Date   LDH 136 10/30/2021   LDH 142 07/20/2021   LDH 147 04/26/2021    STUDIES:  No results found.    HISTORY:   Past Medical History:  Diagnosis Date   Allergy    seasonal   Arthritis    Bipolar 1 disorder ([place])    Cancer ([place])    non hodgkins lymphoma   Depression    GERD (gastroesophageal reflux disease)    History of degenerative disc disease    Hyperlipidemia    Increased risk of breast cancer 04/25/2021   Obstructive sleep apnea    Osteopenia    Scoliosis    Thyroid disease    hypothyroidism   Vitamin D deficiency     Past Surgical History:  Procedure Laterality Date   BREAST BIOPSY     BUBBLE STUDY  02/13/2021   Procedure: BUBBLE STUDY;  Surgeon: Skeet Latch, MD;  Location: [facility];  Service: Cardiovascular;;   CARPAL TUNNEL RELEASE Bilateral 2002   CESAREAN SECTION     x2   COLONOSCOPY  07/26/2008   Melanosis coli. Small internal hemorrhoids.    ENDOSCOPIC PLANTAR FASCIOTOMY     ESOPHAGOGASTRODUODENOSCOPY  03/17/2013   Mild gastritis. Status post esophageal dilatation.   LYMPH NODE BIOPSY     right hip repair torn tendon Right 09/2021   TEE WITHOUT CARDIOVERSION N/A 02/13/2021   Procedure: TRANSESOPHAGEAL ECHOCARDIOGRAM (TEE);  Surgeon: Skeet Latch, MD;  Location: [facility] ENDOSCOPY;  Service: Cardiovascular;  Laterality: N/A;   WISDOM TOOTH EXTRACTION      Family History  Problem Relation Age of Onset   Prostate cancer Father 55   Melanoma Father 33   High blood pressure Father    Breast cancer Maternal Aunt    Multiple myeloma Maternal Aunt    Breast  cancer Maternal Aunt    Colon cancer Neg Hx    Colon polyps Neg Hx    Esophageal cancer Neg Hx    Rectal cancer Neg Hx    Stomach  cancer Neg Hx     Social History:  reports that she has never smoked. She has never used smokeless tobacco. She reports that she does not currently use alcohol. She reports that she does not use drugs.The patient is accompanied by her husband today.  Allergies:  Allergies  Allergen Reactions   Voltaren [Diclofenac Sodium]     Thought it had something to do with her having a stroke   Nsaids     Had a stroke and worries it was related to the Diclofenac she took.    Current Medications: Current Outpatient Medications  Medication Sig Dispense Refill   ascorbic acid (VITAMIN C) 500 MG tablet Take 1 tablet by mouth daily.     aspirin EC 81 MG tablet Take 1 tablet (81 mg total) by mouth daily. 30 tablet 2   Ca [id number] (CALCIUM-FOLIC ACID PLUS D) 5784-6 MG WAFR      chlorpheniramine (CHLOR-TRIMETON) 4 MG tablet Take 4 mg by mouth daily as needed for allergies.     ELDERBERRY PO Take 1 tablet by mouth daily.     FLUoxetine (PROZAC) 20 MG capsule Take 60 mg by mouth daily.     Multiple Vitamin (MULTIVITAMIN ADULT PO) Take by mouth.     Omega 3 1000 MG CAPS Take 1,000 mg by mouth daily.     ondansetron (ZOFRAN) 4 MG tablet Take 1 tablet (4 mg total) by mouth every 8 (eight) hours as needed for nausea or vomiting. 20 tablet 0   Probiotic Product (PROBIOTIC BLEND PO) Take 1 capsule by mouth daily.     QUEtiapine (SEROQUEL) 300 MG tablet Take 300 mg by mouth at bedtime.     ramipril (ALTACE) 1.25 MG capsule Take 1.25 mg by mouth daily.     zinc gluconate 50 MG tablet Take 50 mg by mouth as needed.     No current facility-administered medications for this visit.

## 2022-02-12 DIAGNOSIS — M67951 Unspecified disorder of synovium and tendon, right thigh: Secondary | ICD-10-CM | POA: Diagnosis not present

## 2022-02-12 DIAGNOSIS — M7602 Gluteal tendinitis, left hip: Secondary | ICD-10-CM | POA: Diagnosis not present

## 2022-02-20 DIAGNOSIS — M25511 Pain in right shoulder: Secondary | ICD-10-CM | POA: Diagnosis not present

## 2022-02-20 DIAGNOSIS — Z886 Allergy status to analgesic agent status: Secondary | ICD-10-CM | POA: Diagnosis not present

## 2022-02-20 DIAGNOSIS — M19011 Primary osteoarthritis, right shoulder: Secondary | ICD-10-CM | POA: Diagnosis not present

## 2022-02-20 DIAGNOSIS — Z7982 Long term (current) use of aspirin: Secondary | ICD-10-CM | POA: Diagnosis not present

## 2022-02-20 DIAGNOSIS — X58XXXA Exposure to other specified factors, initial encounter: Secondary | ICD-10-CM | POA: Diagnosis not present

## 2022-02-20 DIAGNOSIS — M7602 Gluteal tendinitis, left hip: Secondary | ICD-10-CM | POA: Diagnosis not present

## 2022-02-21 ENCOUNTER — Telehealth: Payer: Self-pay

## 2022-02-21 NOTE — Telephone Encounter (Signed)
Patient had an appointment in [facility] and they did an xray of her right shoulder. Shoulder hurts worse when raising up above head. So she tries to only move it from the elbow.The xray showed swollen lymph nodes. Wants to know what she needs to do about it? If  you can look at the x-ray and see if it is the lymph node that is causing the pain or if it is something else.

## 2022-03-01 ENCOUNTER — Other Ambulatory Visit: Payer: Self-pay | Admitting: Oncology

## 2022-03-01 DIAGNOSIS — C8302 Small cell B-cell lymphoma, intrathoracic lymph nodes: Secondary | ICD-10-CM

## 2022-03-01 NOTE — Telephone Encounter (Signed)
Patient would like for [place] to schedule to Ultrasound for her instead of her waiting on [facility] on 03/25/22. Follow up from her hip procedure.

## 2022-03-08 ENCOUNTER — Ambulatory Visit (HOSPITAL_COMMUNITY)
Admission: RE | Admit: 2022-03-08 | Discharge: 2022-03-08 | Disposition: A | Discharge status: Home / Self Care | Payer: BC Managed Care – PPO | Source: Ambulatory Visit | Attending: Oncology | Admitting: Oncology

## 2022-03-08 DIAGNOSIS — C8302 Small cell B-cell lymphoma, intrathoracic lymph nodes: Secondary | ICD-10-CM | POA: Diagnosis not present

## 2022-03-08 DIAGNOSIS — R590 Localized enlarged lymph nodes: Secondary | ICD-10-CM | POA: Diagnosis not present

## 2022-03-14 ENCOUNTER — Telehealth: Payer: Self-pay

## 2022-03-14 NOTE — Telephone Encounter (Signed)
Patient calling wanting to know what the Ultrasound results were. Ultrasound has not been read yet. Called them to get a read on the ultrasound.

## 2022-03-20 ENCOUNTER — Telehealth: Payer: Self-pay

## 2022-03-20 NOTE — Telephone Encounter (Addendum)
Appt given for 03/26/2022. ----- Message from Marvia Pickles, PA-C sent at 03/20/2022  1:44 PM EDT ----- Regarding: RE: Swollen lymph nodes seen on [place] Right shoulder- done 03/08/2022 Contact: [phone] We are aware of axillary adenopathy. Can we send CT scan from September to ortho? I assume they have access to Epic. Our schedules are pretty booked, but hopefully could fit in next week on day that Dr. Bishop Dublin is here. Thanks ----- Message ----- From: Dairl Ponder, RN Sent: 03/20/2022   1:33 PM EDT To: Derwood Kaplan, MD; Marvia Pickles, PA-C Subject: Swollen lymph nodes seen on [place] Right shoulde#  Pt called to get some guidance. She had ultrasound of right shoulder done on 03/08/2022 ordered by orthopedic. There were swollen lymph nodes seen in right axilla. Orthopedic doctor would like pt to be seen/cleared by [place] before proceeding with ortho treatment. She is aware that Dr Hinton Rao is out of office this week. Please advise.

## 2022-03-26 ENCOUNTER — Encounter: Payer: Self-pay | Admitting: Oncology

## 2022-03-26 ENCOUNTER — Inpatient Hospital Stay: Payer: BC Managed Care – PPO | Attending: Oncology | Admitting: Oncology

## 2022-03-26 VITALS — BP 141/82 | HR 93 | Temp 98.1°F | Resp 16 | Ht 60.0 in | Wt 128.3 lb

## 2022-03-26 DIAGNOSIS — C8302 Small cell B-cell lymphoma, intrathoracic lymph nodes: Secondary | ICD-10-CM | POA: Diagnosis not present

## 2022-03-27 NOTE — Progress Notes (Signed)
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  She has been on observation only. She has had stable bilateral cervical and inguinal lymphadenopathy.  MRI imaging from February 2023 revealed extensive inguinal and retroperitoneal lymphadenopathy.  Her physical exam reveals a modest change in her cervical and supraclavicular adenopathy. PET scan in March revealed continued stability of mild adenopathy in the neck, chest, abdomen and pelvis with low level FDG and Deauville 2-3 category uptake. The latest CT in September of 2023 shows the numerous mildly enlarged nodes to be unchanged.  Pain of right shoulder An ultrasound of the right axilla was done and reveals stable to slightly larger axillary lymph nodes measuring 3.3 cm and 3.0 cm. I do not feel this is the source of her pain as they have been present for years.   B12 deficiency anemia She was treated with B12 injections then transitioned to oral B12.  She is no longer taking B12 as her B12 level has been elevated at the wellness office.  On May 11, B12 was greater than 2000, iron studies and folate were normal.  She remains off B12.      Overall she continues to have stable disease and we will continue observation only.  Her scan from September 5 really shows no change in the adenopathy throughout the chest, abdomen and pelvis. We did a recent U/S to make sure there was no recent change. I do not feel these stable nodes are the cause of her right shoulder pain and will recommend that Dr. Merrilyn Puma proceed with whatever evaluation and treatment he feels necessary. I will have her keep her follow up appointment in December as scheduled. The patient and her  husband understand the plans discussed today and are in agreement with them.  She knows to contact our office if she develops concerns prior to her next appointment.   I provided 20 minutes of face-to-face time during this encounter and > 50% was spent counseling as documented under my assessment and plan.    Derwood Kaplan, MD  [facility] [address] [place] [address] Dept: [phone] Dept Fax: [phone]   No orders of the defined types were placed in this encounter.     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  Staging CT chest revealed adenopathy of the bilateral neck measuring up to 18 mm in diameter with left subclavian, bilateral axillary and subpectoral adenopathy, but no mediastinal nodes.  CT abdomen was  negative.  CT pelvis revealed bilateral external iliac nodes up to 11 mm in diameter.  She had B symptoms with severe night sweats and weight loss.  She has been on  observation only.  Due to her family history of breast cancer, she underwent testing for hereditary breast and ovarian cancer with the [facility] myRisk Hereditary Cancer Gene panel test.  This did not reveal any clinically significant mutation.  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  Her [facility] breast cancer risk assessment showed  her lifetime risk of breast cancer to be 29.7%, so annual breast MRI, in addition to mammogram is recommended.  Mammogram and MRI breast done in August 2019 did not reveal any evidence of malignancy.  She did go for a second opinion to [facility] regarding her lymphoma and they concurred with the approach of watchful waiting.    She presented to the emergency room in mid April 2021 due  to increased urinary frequency and discomfort.  CT imaging revealed left sided obstructive uropathy with 3 mm calculus in the distal left ureter just proximal to the ureterovesical unction causing moderate hydroureteronephrosis and perinephric stranding.  Left greater than right iliac and pelvic lymphadenopathy, slightly greater than on 10/26/18, consistent with known history of lymphoma.  We did not repeat a scan in June as scheduled.  Annual screening bilateral mammogram from August 2021 was clear.  MRI breast has not been repeated due to her comorbidities.   CT neck in May 2022 revealed multiple lymph nodes in the neck bilaterally, with mild progression of lymph nodes on the right. There has been more significant progression of left level 4 and left supraclavicular lymph nodes compared to the prior study. Largest lymph node in the left supraclavicular region measures 35 x 17 mm. Findings were compatible with lymphoma. CT chest/abdomen/pelvis revealed no substantial interval change in exam. Bilateral supraclavicular, subpectoral, axillary, retroperitoneal, and pelvic lymphadenopathy was similar to prior. There were no definite findings of progression.  At her visit in September, the lymphoma remained stable. She continued to have sciatic pain unrelieved with gabapentin 800 mg TID. She had steroid injections of the spine with improvement.    Bilateral screening mammogram in January revealed a possible asymmetry in the left breast.  She underwent left diagnostic mammogram and the asymmetry disperses with compression, so it is felt to be dense fibroglandular tissue.  There is no evidence of malignancy.  Bilateral screening in 1 year was recommended.  MRI of the right hip from February 2023 revealed extensive inguinal and retroperitoneal lymphadenopathy. There are high-grade tears of the right gluteus numbness greater than medius with background tendinosis and trochanteric bursitis, and bilateral hamstring origin  tendinosis with mild partial tearing. She has mild bilateral hip osteoarthritis, and  right sided labral tear. She states that she has chronic palpable lymph nodes of the left neck and supraclavicular area.  She therefore underwent PET scan in March, which revealed stable disease when compared to previous CT images.   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[place])  11/11/2015 Cancer Staging   Staging form: Hodgkin and Non-Hodgkin Lymphoma, AJCC 8th Edition - Clinical stage from 11/11/2015: Stage III (Small lymphocytic leukemia) - Signed by Derwood Kaplan, MD on 01/28/2021 Histopathologic type: Malignant lymphoma, small B lymphocytic, NOS (see also M-9823/3) Stage prefix: Initial diagnosis Diagnostic confirmation: Positive histology PLUS positive immunophenotyping and/or positive genetic studies Specimen type: Core Needle Biopsy Staged by: Managing physician Stage used in treatment planning: Yes National guidelines used in treatment planning: Yes Type of national guideline used in treatment planning: NCCN Staging comments: Watchful waiting   06/20/2020 Initial Diagnosis   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)   Chronic lymphocytic leukemia (CLL), B-cell ([facility])  10/31/2015 Initial Diagnosis   Chronic lymphocytic leukemia (CLL), B-cell ([facility])   11/11/2015 Cancer Staging   Staging form: Chronic Lymphocytic Leukemia / Small Lymphocytic Lymphoma, AJCC 8th Edition - Clinical stage from 11/11/2015: Modified Rai Stage III (Modified Rai risk: High, Binet:  Stage B, Lugano: Stage III, Lymphocytosis: Absent, Adenopathy: Present, Organomegaly: Absent, Anemia: Absent, Thrombocytopenia: Absent) - Signed by Derwood Kaplan, MD on 04/05/2021 Histopathologic type: B-cell lymphocytic leukemia/small lymphocytic lymphoma (see also M-9670/3) Stage prefix: Initial diagnosis Stage used in treatment planning: Yes National guidelines used in treatment  planning: Yes Type of national guideline used in treatment planning: NCCN       INTERVAL HISTORY:  Clarity is here today for repeat clinical assessment and states she has been doing fairly well. Her main problem has been right shoulder pain and she is seeing Dr. Merrilyn Puma, orthopedic surgeon. She has had a repeat staging with CT scans of chest, abdomen and pelvis in September.  This does show numerous enlarged lymphadenopathy throughout the chest, abdomen and pelvis, but unchanged. To be sure, we also did an U/S of the right axilla and the nodes appear stable.  Her physical exam shows just minor changes and overall her disease appears to remain under control.  She denies fever, chills, appetite changes, weight loss or abdominal pain.  She states she is up-to-date on mammogram and colonoscopy.  REVIEW OF SYSTEMS:  Review of Systems  Constitutional:  Negative for appetite change, chills, fatigue, fever and unexpected weight change.  HENT:   Negative for lump/mass, mouth sores and sore throat.   Respiratory:  Negative for cough and shortness of breath.   Cardiovascular:  Negative for chest pain and leg swelling.  Gastrointestinal:  Negative for abdominal pain, constipation, diarrhea, nausea and vomiting.  Endocrine: Negative for hot flashes.  Genitourinary:  Negative for difficulty urinating, dysuria, frequency and hematuria.   Musculoskeletal:  Negative for arthralgias, back pain and myalgias.  Skin:  Negative for rash.  Neurological:  Negative for dizziness and headaches.  Hematological:  Negative for adenopathy. Does not bruise/bleed easily.  Psychiatric/Behavioral:  Negative for depression and sleep disturbance. The patient is not nervous/anxious.      VITALS:  Blood pressure (!) 141/82, pulse 93, temperature 98.1 F (36.7 C), temperature source Oral, resp. rate 16, height 5' (1.524 m), weight 128 lb 4.8 oz (58.2 kg), SpO2 96 %.  Wt Readings from Last 3 Encounters:  03/26/22 128 lb 4.8 oz  (58.2 kg)  01/30/22 131 lb 4.8 oz (59.6 kg)  10/30/21 130 lb 1.6 oz (59 kg)    Body mass index is 25.06 kg/m.  Performance status (ECOG): 2 - Symptomatic, <50% confined to bed  PHYSICAL EXAM:  Physical Exam Vitals and nursing note reviewed.  Constitutional:      General: She is not in acute distress.    Appearance: Normal appearance.  HENT:     Head: Normocephalic and atraumatic.     Mouth/Throat:     Mouth: Mucous membranes are moist.     Pharynx: Oropharynx is clear. No oropharyngeal exudate or posterior oropharyngeal erythema.  Eyes:     General: No scleral icterus.    Extraocular Movements: Extraocular movements intact.     Conjunctiva/sclera: Conjunctivae normal.     Pupils: Pupils are equal, round, and reactive to light.  Cardiovascular:     Rate and Rhythm: Normal rate and regular rhythm.     Heart sounds: Normal heart sounds. No murmur heard.    No friction rub. No gallop.  Pulmonary:     Effort: Pulmonary effort is normal.     Breath sounds: Normal breath sounds. No wheezing, rhonchi or rales.  Abdominal:     General: There is no distension.     Palpations: Abdomen is soft. There  is no hepatomegaly, splenomegaly or mass.     Tenderness: There is no abdominal tenderness.  Musculoskeletal:        General: Normal range of motion.     Cervical back: Normal range of motion and neck supple. No tenderness.     Right lower leg: No edema.     Left lower leg: Edema (1+) present.  Lymphadenopathy:     Cervical: Cervical adenopathy present.     Upper Body:     Right upper body: No supraclavicular or axillary adenopathy.     Left upper body: No supraclavicular or axillary adenopathy.     Lower Body: No right inguinal adenopathy. Left inguinal adenopathy present.     Comments: There are minor changes in her adenopathy.  She has a left posterior cervical node measuring 2 cm and the left axillary node has increased to 2 to 3 cm.  The left inguinal node measures about 2 cm.  No  other significant adenopathy.  Skin:    General: Skin is warm and dry.     Coloration: Skin is not jaundiced.     Findings: No rash.  Neurological:     Mental Status: She is alert and oriented to person, place, and time.     Cranial Nerves: No cranial nerve deficit.  Psychiatric:        Mood and Affect: Mood normal.        Behavior: Behavior normal.        Thought Content: Thought content normal.     LABS:      Latest Ref Rng & Units 01/29/2022   12:00 AM 10/30/2021   12:00 AM 10/17/2021    5:11 PM  CBC  WBC  3.6       3.6     4.1     6.4   Hemoglobin 12.0 - 16.0 12.0 - 16.0 12.3       12.3     11.8     12.6   Hematocrit 36 - 46 36 - 46 37       37     33     38.5   Platelets 150 - 400 K/uL 150 - 400 K/uL 219       219     218     234      This result is from an external source.   Multiple values from one day are sorted in reverse-chronological order      Latest Ref Rng & Units 01/29/2022   12:00 AM 10/30/2021   12:00 AM 10/17/2021    5:11 PM  CMP  Glucose 70 - 99 mg/dL   113   BUN 4 - 21 4 - _0 Creatinine 0.5 - 1.1 0.5 - 1.1 0.8       0.8     0.7     0.74   Sodium 137 - 147 137 - 147 138       138     139     140   Potassium 3.5 - 5.1 mEq/L 3.5 - 5.1 mEq/L 4.8       4.8     4.3     4.5   Chloride 99 - 108 99 - 108 100       100     101     106   CO2 13 - 22  13 - _0 Calcium 8.7 - 10.7 8.7 - 10.7 9.1       9.1     9.2     9.4   Alkaline Phos 25 - 125 25 - 125 63       63     56       AST 13 - 35 13 - 35 _1 ALT 7 - 35 U/L 7 - 35 U/L _2 This result is from an external source.   Multiple values from one day are sorted in reverse-chronological order     No results found for: "CEA1", "CEA" / No results found for: "CEA1", "CEA" No results found for: "PSA1" No results found for: "SKS138" No results found for: "CAN125"  No results found for:  "TOTALPROTELP", "ALBUMINELP", "A1GS", "A2GS", "BETS", "BETA2SER", "GAMS", "MSPIKE", "SPEI" Lab Results  Component Value Date   TIBC 331 07/23/2021   FERRITIN 22 07/23/2021   IRONPCTSAT 20 07/23/2021   Lab Results  Component Value Date   LDH 136 10/30/2021   LDH 142 07/20/2021   LDH 147 04/26/2021    STUDIES:  No results found.    HISTORY:   Past Medical History:  Diagnosis Date   Allergy    seasonal   Arthritis    Bipolar 1 disorder ([facility])    Cancer ([facility])    non hodgkins lymphoma   Depression    GERD (gastroesophageal reflux disease)    History of degenerative disc disease    Hyperlipidemia    Increased risk of breast cancer 04/25/2021   Obstructive sleep apnea    Osteopenia    Scoliosis    Thyroid disease    hypothyroidism   Vitamin D deficiency     Past Surgical History:  Procedure Laterality Date   BREAST BIOPSY     BUBBLE STUDY  02/13/2021   Procedure: BUBBLE STUDY;  Surgeon: Skeet Latch, MD;  Location: [facility];  Service: Cardiovascular;;   CARPAL TUNNEL RELEASE Bilateral 2002   CESAREAN SECTION     x2   COLONOSCOPY  07/26/2008   Melanosis coli. Small internal hemorrhoids.    ENDOSCOPIC PLANTAR FASCIOTOMY     ESOPHAGOGASTRODUODENOSCOPY  03/17/2013   Mild gastritis. Status post esophageal dilatation.   LYMPH NODE BIOPSY     right hip repair torn tendon Right 09/2021   TEE WITHOUT CARDIOVERSION N/A 02/13/2021   Procedure: TRANSESOPHAGEAL ECHOCARDIOGRAM (TEE);  Surgeon: Skeet Latch, MD;  Location: [facility];  Service: Cardiovascular;  Laterality: N/A;   WISDOM TOOTH EXTRACTION      Family History  Problem Relation Age of Onset   Prostate cancer Father 54   Melanoma Father 61   High blood pressure Father    Breast cancer Maternal Aunt    Multiple myeloma Maternal Aunt    Breast cancer Maternal Aunt    Colon cancer Neg Hx    Colon polyps Neg Hx    Esophageal cancer Neg Hx    Rectal cancer Neg Hx    Stomach cancer Neg Hx      Social History:  reports that she has never smoked. She has never used smokeless tobacco. She reports that she does not  currently use alcohol. She reports that she does not use drugs.The patient is accompanied by her husband today.  Allergies:  Allergies  Allergen Reactions   Voltaren [Diclofenac Sodium]     Thought it had something to do with her having a stroke   Nsaids     Had a stroke and worries it was related to the Diclofenac she took.    Current Medications: Current Outpatient Medications  Medication Sig Dispense Refill   ascorbic acid (VITAMIN C) 500 MG tablet Take 1 tablet by mouth daily.     aspirin EC 81 MG tablet Take 1 tablet (81 mg total) by mouth daily. 30 tablet 2   Ca [id number] (CALCIUM-FOLIC ACID PLUS D) 4818-5 MG WAFR      chlorpheniramine (CHLOR-TRIMETON) 4 MG tablet Take 4 mg by mouth daily as needed for allergies.     ELDERBERRY PO Take 1 tablet by mouth daily.     FLUoxetine (PROZAC) 20 MG capsule Take 60 mg by mouth daily.     Multiple Vitamin (MULTIVITAMIN ADULT PO) Take by mouth.     Omega 3 1000 MG CAPS Take 1,000 mg by mouth daily.     ondansetron (ZOFRAN) 4 MG tablet Take 1 tablet (4 mg total) by mouth every 8 (eight) hours as needed for nausea or vomiting. 20 tablet 0   Probiotic Product (PROBIOTIC BLEND PO) Take 1 capsule by mouth daily.     ramipril (ALTACE) 1.25 MG capsule Take 1.25 mg by mouth daily.     zinc gluconate 50 MG tablet Take 50 mg by mouth as needed.     No current facility-administered medications for this visit.

## 2022-04-03 DIAGNOSIS — M7551 Bursitis of right shoulder: Secondary | ICD-10-CM | POA: Diagnosis not present

## 2022-04-03 DIAGNOSIS — M75111 Incomplete rotator cuff tear or rupture of right shoulder, not specified as traumatic: Secondary | ICD-10-CM | POA: Diagnosis not present

## 2022-04-10 DIAGNOSIS — M25611 Stiffness of right shoulder, not elsewhere classified: Secondary | ICD-10-CM | POA: Diagnosis not present

## 2022-04-10 DIAGNOSIS — M25511 Pain in right shoulder: Secondary | ICD-10-CM | POA: Diagnosis not present

## 2022-04-16 DIAGNOSIS — M25611 Stiffness of right shoulder, not elsewhere classified: Secondary | ICD-10-CM | POA: Diagnosis not present

## 2022-04-16 DIAGNOSIS — M25511 Pain in right shoulder: Secondary | ICD-10-CM | POA: Diagnosis not present

## 2022-04-25 DIAGNOSIS — Z79899 Other long term (current) drug therapy: Secondary | ICD-10-CM | POA: Diagnosis not present

## 2022-04-25 DIAGNOSIS — I10 Essential (primary) hypertension: Secondary | ICD-10-CM | POA: Diagnosis not present

## 2022-04-25 DIAGNOSIS — E039 Hypothyroidism, unspecified: Secondary | ICD-10-CM | POA: Diagnosis not present

## 2022-04-25 DIAGNOSIS — R7301 Impaired fasting glucose: Secondary | ICD-10-CM | POA: Diagnosis not present

## 2022-04-25 DIAGNOSIS — E785 Hyperlipidemia, unspecified: Secondary | ICD-10-CM | POA: Diagnosis not present

## 2022-05-01 ENCOUNTER — Other Ambulatory Visit: Payer: Self-pay

## 2022-05-01 ENCOUNTER — Encounter: Payer: Self-pay | Admitting: Oncology

## 2022-05-01 ENCOUNTER — Other Ambulatory Visit: Payer: BC Managed Care – PPO

## 2022-05-01 ENCOUNTER — Other Ambulatory Visit: Payer: Self-pay | Admitting: Oncology

## 2022-05-01 ENCOUNTER — Inpatient Hospital Stay: Payer: BC Managed Care – PPO | Attending: Oncology | Admitting: Oncology

## 2022-05-01 VITALS — BP 136/85 | HR 67 | Temp 98.4°F | Resp 18 | Ht 60.0 in | Wt 127.6 lb

## 2022-05-01 DIAGNOSIS — C8302 Small cell B-cell lymphoma, intrathoracic lymph nodes: Secondary | ICD-10-CM

## 2022-05-01 DIAGNOSIS — Z1239 Encounter for other screening for malignant neoplasm of breast: Secondary | ICD-10-CM

## 2022-05-01 NOTE — Progress Notes (Signed)
[facility]  [address] 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  She has been on observation only. She has had stable bilateral cervical and inguinal lymphadenopathy.  MRI imaging from February 2023 revealed extensive inguinal and retroperitoneal lymphadenopathy.  Her physical exam reveals a modest change in her cervical and supraclavicular adenopathy. PET scan in March revealed continued stability of mild adenopathy in the neck, chest, abdomen and pelvis with low level FDG and Deauville 2-3 category uptake. The latest CT in September of 2023 shows the numerous mildly enlarged nodes to be unchanged.  Pain of right shoulder An ultrasound of the right axilla was done and reveals stable to slightly larger axillary lymph nodes measuring 3.3 cm and 3.0 cm. I do not feel this is the source of her pain as they have been present for years. She has been evaluated and is undergoing physical therapy and Cortisone injection.   B12 deficiency anemia She was treated with B12 injections then transitioned to oral B12.  She is no longer taking B12 as her B12 level has been elevated at the [facility].  On May 11, B12 was greater than 2000, iron studies and folate were normal.  She remains off B12 as her last level in June was over 1,000. We will recheck it next year.   Plan Overall she has mild increase in her adenopathy and we will continue observation only.  Her next bilateral screening mammogram will be on 06/17/22. She will return in 3 months for CBC, CMP, B-12 level labs. The patient and her husband understand the plans discussed today and are in agreement with them.  She knows to contact our office if she develops concerns prior to  her next appointment.   I provided 20 minutes of face-to-face time during this encounter and > 50% was spent counseling as documented under my assessment and plan.    I,[name],acting as a scribe for Derwood Kaplan, MD.,have documented all relevant documentation on the behalf of Derwood Kaplan, MD,as directed by  Derwood Kaplan, MD while in the presence of Derwood Kaplan, [place], [place] [address] Dept: [phone] Dept Fax: [phone]   No orders of the defined types were placed in this encounter.     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  Staging CT chest revealed adenopathy of the bilateral neck measuring up to 18 mm in diameter with left subclavian, bilateral axillary and subpectoral adenopathy, but no mediastinal nodes.  CT abdomen was  negative.  CT pelvis revealed bilateral external iliac nodes up to 11 mm in diameter.  She had B symptoms with severe night sweats and weight loss.  She has been on  observation only.  Due to her family history of breast cancer, she underwent testing for hereditary breast and ovarian cancer with the [facility] myRisk Hereditary Cancer Gene panel test.  This did not reveal any clinically significant mutation.  There was a variant of uncertain  significance of the RAD 51C gene.  Her [facility] breast cancer risk assessment showed her lifetime risk of breast cancer to be 29.7%, so annual breast MRI, in addition to mammogram is recommended.  Mammogram and MRI breast done in August 2019 did not reveal any evidence of malignancy.  She did go for a second opinion to [facility] regarding her lymphoma and they  concurred with the approach of watchful waiting.    She presented to the emergency room in mid April 2021 due to increased urinary frequency and discomfort.  CT imaging revealed left sided obstructive uropathy with 3 mm calculus in the distal left ureter just proximal to the ureterovesical unction causing moderate hydroureteronephrosis and perinephric stranding.  Left greater than right iliac and pelvic lymphadenopathy, slightly greater than on 10/26/18, consistent with known history of lymphoma.  We did not repeat a scan in June as scheduled.  Annual screening bilateral mammogram from August 2021 was clear.  MRI breast has not been repeated due to her comorbidities.   CT neck in May 2022 revealed multiple lymph nodes in the neck bilaterally, with mild progression of lymph nodes on the right. There has been more significant progression of left level 4 and left supraclavicular lymph nodes compared to the prior study. Largest lymph node in the left supraclavicular region measures 35 x 17 mm. Findings were compatible with lymphoma. CT chest/abdomen/pelvis revealed no substantial interval change in exam. Bilateral supraclavicular, subpectoral, axillary, retroperitoneal, and pelvic lymphadenopathy was similar to prior. There were no definite findings of progression.  At her visit in September, the lymphoma remained stable. She continued to have sciatic pain unrelieved with gabapentin 800 mg TID. She had steroid injections of the spine with improvement.    Bilateral screening mammogram in January revealed a possible asymmetry in the left breast.  She underwent left diagnostic mammogram and the asymmetry disperses with compression, so it is felt to be dense fibroglandular tissue.  There is no evidence of malignancy.  Bilateral screening in 1 year was recommended.  MRI of the right hip from February 2023 revealed extensive inguinal and retroperitoneal lymphadenopathy. There are high-grade tears of the right gluteus  numbness greater than medius with background tendinosis and trochanteric bursitis, and bilateral hamstring origin tendinosis with mild partial tearing. She has mild bilateral hip osteoarthritis, and  right sided labral tear. She states that she has chronic palpable lymph nodes of the left neck and supraclavicular area.  She therefore underwent PET scan in March, which revealed stable disease when compared to previous CT images.   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[facility])  11/11/2015 Cancer Staging   Staging form: Hodgkin and Non-Hodgkin Lymphoma, AJCC 8th Edition - Clinical stage from 11/11/2015: Stage III (Small lymphocytic leukemia) - Signed by Derwood Kaplan, MD on 01/28/2021 Histopathologic type: Malignant lymphoma, small B lymphocytic, NOS (see also M-9823/3) Stage prefix: Initial diagnosis Diagnostic confirmation: Positive histology PLUS positive immunophenotyping and/or positive genetic studies Specimen type: Core Needle Biopsy Staged by: Managing physician Stage used in treatment planning: Yes National guidelines used in treatment planning: Yes Type of national guideline used in treatment planning: NCCN Staging comments: Watchful waiting   06/20/2020 Initial Diagnosis   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)   Chronic lymphocytic leukemia (CLL), B-cell ([facility] [place])  10/31/2015 Initial Diagnosis   Chronic lymphocytic leukemia (CLL), B-cell ([facility])   11/11/2015 Cancer Staging   Staging form: Chronic Lymphocytic Leukemia / Small Lymphocytic Lymphoma, AJCC 8th  Edition - Clinical stage from 11/11/2015: Modified Rai Stage III (Modified Rai risk: High, Binet: Stage B, Lugano: Stage III, Lymphocytosis: Absent, Adenopathy: Present, Organomegaly: Absent, Anemia: Absent, Thrombocytopenia: Absent) - Signed by Derwood Kaplan, MD on 04/05/2021 Histopathologic type: B-cell lymphocytic leukemia/small lymphocytic lymphoma (see also  M-9670/3) Stage prefix: Initial diagnosis Stage used in treatment planning: Yes National guidelines used in treatment planning: Yes Type of national guideline used in treatment planning: NCCN       INTERVAL HISTORY:  Dawn Prince is here today for repeat clinical assessment for CLL and states she has been doing fairly well. She is doing physical therapy and a Cortisone shot for pain her right shoulder recommended by Dr. Merrilyn Puma. She still has some pain but it has improved. Her CBC, CMP, and TSH/T4 labs looked good. Her hemoglobin is stable at 12.2. Her next bilateral screening mammogram will be on 06/17/22. She has had a repeat staging with CT scans of chest, abdomen and pelvis in September, 2023.  This does show numerous enlarged lymphadenopathy throughout the chest, abdomen and pelvis, but unchanged. To be sure, we also did an U/S of the right axilla and the nodes appear stable.  Her physical exam shows just minor changes and overall her disease appears to remain under control. I advised she take her Covid booster soon. She denies fever, chills, appetite changes, weight loss or abdominal pain. Her weight is stable. She states she is up-to-date on mammogram and colonoscopy.   REVIEW OF SYSTEMS:  Review of Systems  Constitutional:  Negative for appetite change, chills, fever and unexpected weight change.  HENT:  Negative.  Negative for lump/mass, mouth sores and sore throat.   Eyes: Negative.   Respiratory: Negative.  Negative for chest tightness, cough, hemoptysis, shortness of breath and wheezing.   Cardiovascular: Negative.  Negative for chest pain, leg swelling and palpitations.  Gastrointestinal: Negative.  Negative for abdominal distention, abdominal pain, blood in stool, constipation, diarrhea, nausea and vomiting.  Endocrine: Negative.   Genitourinary: Negative.  Negative for difficulty urinating, dysuria, frequency and hematuria.   Musculoskeletal:  Positive for arthralgias (right shoulder  improved but still hurts). Negative for back pain, flank pain and gait problem.  Skin: Negative.   Neurological:  Negative for dizziness, extremity weakness, gait problem, headaches, light-headedness, numbness, seizures and speech difficulty.  Hematological: Negative.  Negative for adenopathy. Does not bruise/bleed easily.  Psychiatric/Behavioral: Negative.  Negative for depression and sleep disturbance. The patient is not nervous/anxious.      VITALS:  Blood pressure 136/85, pulse 67, temperature 98.4 F (36.9 C), temperature source Oral, resp. rate 18, height 5' (1.524 m), weight 127 lb 9.6 oz (57.9 kg), SpO2 100 %.  Wt Readings from Last 3 Encounters:  05/01/22 127 lb 9.6 oz (57.9 kg)  03/26/22 128 lb 4.8 oz (58.2 kg)  01/30/22 131 lb 4.8 oz (59.6 kg)    Body mass index is 24.92 kg/m.  Performance status (ECOG): 2 - Symptomatic, <50% confined to bed  PHYSICAL EXAM:  Physical Exam Constitutional:      General: She is not in acute distress.    Appearance: Normal appearance. She is normal weight.  HENT:     Head: Normocephalic and atraumatic.  Eyes:     General: No scleral icterus.    Extraocular Movements: Extraocular movements intact.     Conjunctiva/sclera: Conjunctivae normal.     Pupils: Pupils are equal, round, and reactive to light.  Neck:     Comments: Left posterior cervical node measuring  1-2cm then she has 2 more nodes anterior to that, 1cm each  She has a left supraclavicular node measures about 2cm with another smaller one medial to that  1cm right posterior cervical node  Firmness in right supraclavicular fossa  A 2-3cm node in anterior right axillary area  Cardiovascular:     Rate and Rhythm: Normal rate and regular rhythm.     Pulses: Normal pulses.     Heart sounds: Normal heart sounds. No murmur heard.    No friction rub. No gallop.  Pulmonary:     Effort: Pulmonary effort is normal. No respiratory distress.     Breath sounds: Normal breath sounds.   Abdominal:     General: Bowel sounds are normal. There is no distension.     Palpations: Abdomen is soft. There is no hepatomegaly, splenomegaly or mass.     Tenderness: There is no abdominal tenderness.     Comments: Multiple nodes in left inguinal area measuring 1-2cm  Minor adenopathy in right inguinal area  Musculoskeletal:        General: Normal range of motion.     Cervical back: Normal range of motion and neck supple.     Right lower leg: No edema.     Left lower leg: No edema.  Lymphadenopathy:     Cervical: No cervical adenopathy.     Right cervical: No superficial, deep or posterior cervical adenopathy.    Left cervical: No superficial, deep or posterior cervical adenopathy.     Upper Body:     Right upper body: No supraclavicular, axillary or pectoral adenopathy.     Left upper body: No supraclavicular, axillary or pectoral adenopathy.  Skin:    General: Skin is warm and dry.  Neurological:     General: No focal deficit present.     Mental Status: She is alert and oriented to person, place, and time. Mental status is at baseline.  Psychiatric:        Mood and Affect: Mood normal.        Behavior: Behavior normal.        Thought Content: Thought content normal.        Judgment: Judgment normal.     LABS:      Latest Ref Rng & Units 01/29/2022   12:00 AM 10/30/2021   12:00 AM 10/17/2021    5:11 PM  CBC  WBC  3.6       3.6     4.1     6.4   Hemoglobin 12.0 - 16.0 12.0 - 16.0 12.3       12.3     11.8     12.6   Hematocrit 36 - 46 36 - 46 37       37     33     38.5   Platelets 150 - 400 K/uL 150 - 400 K/uL 219       219     218     234      This result is from an external source.   Multiple values from one day are sorted in reverse-chronological order      Latest Ref Rng & Units 01/29/2022   12:00 AM 10/30/2021   12:00 AM 10/17/2021    5:11 PM  CMP  Glucose 70 - 99 mg/dL   113   BUN 4 - 21 4 - _0 26  18   Creatinine 0.5 - 1.1 0.5 -  1.1 0.8       0.8     0.7     0.74   Sodium 137 - 147 137 - 147 138       138     139     140   Potassium 3.5 - 5.1 mEq/L 3.5 - 5.1 mEq/L 4.8       4.8     4.3     4.5   Chloride 99 - 108 99 - 108 100       100     101     106   CO2 13 - 22 13 - _0 Calcium 8.7 - 10.7 8.7 - 10.7 9.1       9.1     9.2     9.4   Alkaline Phos 25 - 125 25 - 125 63       63     56       AST 13 - 35 13 - 35 _1 ALT 7 - 35 U/L 7 - 35 U/L _2 This result is from an external source.   Multiple values from one day are sorted in reverse-chronological order     No results found for: "CEA1", "CEA" / No results found for: "CEA1", "CEA" No results found for: "PSA1" No results found for: "GKK159" No results found for: "CAN125"  No results found for: "TOTALPROTELP", "ALBUMINELP", "A1GS", "A2GS", "BETS", "BETA2SER", "GAMS", "MSPIKE", "SPEI" Lab Results  Component Value Date   TIBC 331 07/23/2021   FERRITIN 22 07/23/2021   IRONPCTSAT 20 07/23/2021   Lab Results  Component Value Date   LDH 136 10/30/2021   LDH 142 07/20/2021   LDH 147 04/26/2021    STUDIES:  No results found.   EXAM: ULTRASOUND OF THE right AXILLA 03/14/22   COMPARISON:  CT scan 01/29/2022    FINDINGS: Enlarged right axillary lymph nodes as demonstrated on the recent CT scan. The largest node measures 3.3 x 1.0 x 2.8 cm and the second largest node measures 3.0 x 1.0 x 2.0 cm. Abnormal lymph node architecture with no fatty hilum demonstrated. Patient with known lymphoma.   IMPRESSION: Enlarged right axillary lymph nodes consistent with known lymphoma.       HISTORY:   Past Medical History:  Diagnosis Date   Allergy    seasonal   Arthritis    Bipolar 1 disorder ([facility])    Cancer ([facility])    non hodgkins lymphoma   Depression    GERD (gastroesophageal reflux disease)    History of degenerative disc disease    Hyperlipidemia    Increased  risk of breast cancer 04/25/2021   Obstructive sleep apnea    Osteopenia    Scoliosis    Thyroid disease    hypothyroidism   Vitamin D deficiency     Past Surgical History:  Procedure Laterality Date   BREAST BIOPSY     BUBBLE STUDY  02/13/2021   Procedure: BUBBLE STUDY;  Surgeon: Skeet Latch, MD;  Location: [facility];  Service: Cardiovascular;;   CARPAL TUNNEL RELEASE Bilateral 2002   CESAREAN  SECTION     x2   COLONOSCOPY  07/26/2008   Melanosis coli. Small internal hemorrhoids.    ENDOSCOPIC PLANTAR FASCIOTOMY     ESOPHAGOGASTRODUODENOSCOPY  03/17/2013   Mild gastritis. Status post esophageal dilatation.   LYMPH NODE BIOPSY     right hip repair torn tendon Right 09/2021   TEE WITHOUT CARDIOVERSION N/A 02/13/2021   Procedure: TRANSESOPHAGEAL ECHOCARDIOGRAM (TEE);  Surgeon: Skeet Latch, MD;  Location: [facility] ENDOSCOPY;  Service: Cardiovascular;  Laterality: N/A;   WISDOM TOOTH EXTRACTION      Family History  Problem Relation Age of Onset   Prostate cancer Father 9   Melanoma Father 65   High blood pressure Father    Breast cancer Maternal Aunt    Multiple myeloma Maternal Aunt    Breast cancer Maternal Aunt    Colon cancer Neg Hx    Colon polyps Neg Hx    Esophageal cancer Neg Hx    Rectal cancer Neg Hx    Stomach cancer Neg Hx     Social History:  reports that she has never smoked. She has never used smokeless tobacco. She reports that she does not currently use alcohol. She reports that she does not use drugs.The patient is accompanied by her husband today.  Allergies:  Allergies  Allergen Reactions   Voltaren [Diclofenac Sodium]     Thought it had something to do with her having a stroke   Nsaids     Had a stroke and worries it was related to the Diclofenac she took.    Current Medications: Current Outpatient Medications  Medication Sig Dispense Refill   QUEtiapine (SEROQUEL) 25 MG tablet Take 25 mg by mouth daily at 12 noon.     ascorbic acid  (VITAMIN C) 500 MG tablet Take 1 tablet by mouth daily.     aspirin EC 81 MG tablet Take 1 tablet (81 mg total) by mouth daily. 30 tablet 2   Ca [id number] (CALCIUM-FOLIC ACID PLUS D) 5956-3 MG WAFR      chlorpheniramine (CHLOR-TRIMETON) 4 MG tablet Take 4 mg by mouth daily as needed for allergies.     ELDERBERRY PO Take 1 tablet by mouth daily.     FLUoxetine (PROZAC) 20 MG capsule Take 60 mg by mouth daily.     Multiple Vitamin (MULTIVITAMIN ADULT PO) Take by mouth.     Omega 3 1000 MG CAPS Take 1,000 mg by mouth daily.     ondansetron (ZOFRAN) 4 MG tablet Take 1 tablet (4 mg total) by mouth every 8 (eight) hours as needed for nausea or vomiting. 20 tablet 0   Probiotic Product (PROBIOTIC BLEND PO) Take 1 capsule by mouth daily.     ramipril (ALTACE) 1.25 MG capsule Take 1.25 mg by mouth daily.     zinc gluconate 50 MG tablet Take 50 mg by mouth as needed.     No current facility-administered medications for this visit.

## 2022-05-02 DIAGNOSIS — F319 Bipolar disorder, unspecified: Secondary | ICD-10-CM | POA: Diagnosis not present

## 2022-05-02 DIAGNOSIS — I10 Essential (primary) hypertension: Secondary | ICD-10-CM | POA: Diagnosis not present

## 2022-05-02 DIAGNOSIS — H6991 Unspecified Eustachian tube disorder, right ear: Secondary | ICD-10-CM | POA: Diagnosis not present

## 2022-05-02 DIAGNOSIS — E039 Hypothyroidism, unspecified: Secondary | ICD-10-CM | POA: Diagnosis not present

## 2022-05-06 DIAGNOSIS — Z23 Encounter for immunization: Secondary | ICD-10-CM | POA: Diagnosis not present

## 2022-05-07 DIAGNOSIS — E039 Hypothyroidism, unspecified: Secondary | ICD-10-CM | POA: Diagnosis not present

## 2022-05-09 DIAGNOSIS — L820 Inflamed seborrheic keratosis: Secondary | ICD-10-CM | POA: Diagnosis not present

## 2022-06-05 ENCOUNTER — Telehealth: Payer: Self-pay

## 2022-06-05 NOTE — Telephone Encounter (Signed)
Called patient to let her know to try Benadryl and to contact her PCP. She states she will call her PCP and let them know.

## 2022-06-05 NOTE — Telephone Encounter (Signed)
Patient called c/o intermittent hives for two weeks  voiced she left you a previous message . She would like to be seen or called thanks. No new medication, soaps, or detergent. She has not tried benadryl she wanted to hear back from you first.

## 2022-06-18 DIAGNOSIS — Z1239 Encounter for other screening for malignant neoplasm of breast: Secondary | ICD-10-CM | POA: Diagnosis not present

## 2022-06-18 DIAGNOSIS — Z1231 Encounter for screening mammogram for malignant neoplasm of breast: Secondary | ICD-10-CM | POA: Diagnosis not present

## 2022-06-25 ENCOUNTER — Encounter: Payer: Self-pay | Admitting: Oncology

## 2022-06-25 ENCOUNTER — Telehealth: Payer: Self-pay

## 2022-06-25 NOTE — Telephone Encounter (Signed)
Patient notified

## 2022-06-25 NOTE — Telephone Encounter (Signed)
-----  Message from Derwood Kaplan, MD sent at 06/21/2022  7:18 PM EST ----- Regarding: call Tell her mammo is clear

## 2022-07-08 DIAGNOSIS — Z79899 Other long term (current) drug therapy: Secondary | ICD-10-CM | POA: Diagnosis not present

## 2022-07-08 DIAGNOSIS — Z6823 Body mass index (BMI) 23.0-23.9, adult: Secondary | ICD-10-CM | POA: Diagnosis not present

## 2022-07-08 DIAGNOSIS — C8302 Small cell B-cell lymphoma, intrathoracic lymph nodes: Secondary | ICD-10-CM | POA: Diagnosis not present

## 2022-07-08 DIAGNOSIS — L509 Urticaria, unspecified: Secondary | ICD-10-CM | POA: Diagnosis not present

## 2022-07-08 DIAGNOSIS — I10 Essential (primary) hypertension: Secondary | ICD-10-CM | POA: Diagnosis not present

## 2022-07-09 DIAGNOSIS — M7551 Bursitis of right shoulder: Secondary | ICD-10-CM | POA: Diagnosis not present

## 2022-07-09 DIAGNOSIS — M7602 Gluteal tendinitis, left hip: Secondary | ICD-10-CM | POA: Diagnosis not present

## 2022-07-09 DIAGNOSIS — M75111 Incomplete rotator cuff tear or rupture of right shoulder, not specified as traumatic: Secondary | ICD-10-CM | POA: Diagnosis not present

## 2022-07-09 DIAGNOSIS — S76011S Strain of muscle, fascia and tendon of right hip, sequela: Secondary | ICD-10-CM | POA: Diagnosis not present

## 2022-07-14 ENCOUNTER — Emergency Department (HOSPITAL_COMMUNITY): Payer: BC Managed Care – PPO

## 2022-07-14 ENCOUNTER — Encounter (HOSPITAL_COMMUNITY): Payer: Self-pay | Admitting: Emergency Medicine

## 2022-07-14 ENCOUNTER — Emergency Department (HOSPITAL_COMMUNITY)
Admission: EM | Admit: 2022-07-14 | Discharge: 2022-07-15 | Disposition: A | Discharge status: Home / Self Care | Payer: BC Managed Care – PPO | Attending: Emergency Medicine | Admitting: Emergency Medicine

## 2022-07-14 ENCOUNTER — Other Ambulatory Visit: Payer: Self-pay

## 2022-07-14 DIAGNOSIS — D649 Anemia, unspecified: Secondary | ICD-10-CM | POA: Diagnosis not present

## 2022-07-14 DIAGNOSIS — Z79899 Other long term (current) drug therapy: Secondary | ICD-10-CM | POA: Insufficient documentation

## 2022-07-14 DIAGNOSIS — H532 Diplopia: Secondary | ICD-10-CM | POA: Insufficient documentation

## 2022-07-14 DIAGNOSIS — H43392 Other vitreous opacities, left eye: Secondary | ICD-10-CM | POA: Diagnosis not present

## 2022-07-14 DIAGNOSIS — I10 Essential (primary) hypertension: Secondary | ICD-10-CM | POA: Insufficient documentation

## 2022-07-14 DIAGNOSIS — H53132 Sudden visual loss, left eye: Secondary | ICD-10-CM | POA: Diagnosis not present

## 2022-07-14 DIAGNOSIS — Y900 Blood alcohol level of less than 20 mg/100 ml: Secondary | ICD-10-CM | POA: Diagnosis not present

## 2022-07-14 DIAGNOSIS — H5789 Other specified disorders of eye and adnexa: Secondary | ICD-10-CM | POA: Diagnosis not present

## 2022-07-14 DIAGNOSIS — E039 Hypothyroidism, unspecified: Secondary | ICD-10-CM | POA: Diagnosis not present

## 2022-07-14 DIAGNOSIS — R9431 Abnormal electrocardiogram [ECG] [EKG]: Secondary | ICD-10-CM | POA: Diagnosis not present

## 2022-07-14 DIAGNOSIS — Z7982 Long term (current) use of aspirin: Secondary | ICD-10-CM | POA: Insufficient documentation

## 2022-07-14 LAB — DIFFERENTIAL
Abs Immature Granulocytes: 0.01 10*3/uL (ref 0.00–0.07)
Basophils Absolute: 0 10*3/uL (ref 0.0–0.1)
Basophils Relative: 0 %
Eosinophils Absolute: 0.1 10*3/uL (ref 0.0–0.5)
Eosinophils Relative: 1 %
Immature Granulocytes: 0 %
Lymphocytes Relative: 52 %
Lymphs Abs: 2.9 10*3/uL (ref 0.7–4.0)
Monocytes Absolute: 0.5 10*3/uL (ref 0.1–1.0)
Monocytes Relative: 9 %
Neutro Abs: 2.2 10*3/uL (ref 1.7–7.7)
Neutrophils Relative %: 38 %

## 2022-07-14 LAB — COMPREHENSIVE METABOLIC PANEL
ALT: 15 U/L (ref 0–44)
AST: 20 U/L (ref 15–41)
Albumin: 3.3 g/dL — ABNORMAL LOW (ref 3.5–5.0)
Alkaline Phosphatase: 47 U/L (ref 38–126)
Anion gap: 10 (ref 5–15)
BUN: 18 mg/dL (ref 8–23)
CO2: 26 mmol/L (ref 22–32)
Calcium: 9.3 mg/dL (ref 8.9–10.3)
Chloride: 103 mmol/L (ref 98–111)
Creatinine, Ser: 0.79 mg/dL (ref 0.44–1.00)
GFR, Estimated: >60 mL/min (ref >60)
Glucose, Bld: 117 mg/dL — ABNORMAL HIGH (ref 70–99)
Potassium: 4.3 mmol/L (ref 3.5–5.1)
Sodium: 139 mmol/L (ref 135–145)
Total Bilirubin: 0.2 mg/dL — ABNORMAL LOW (ref 0.3–1.2)
Total Protein: 6.7 g/dL (ref 6.5–8.1)

## 2022-07-14 LAB — CBC
HCT: 36.5 % (ref 36.0–46.0)
Hemoglobin: 11.5 g/dL — ABNORMAL LOW (ref 12.0–15.0)
MCH: 32.8 pg (ref 26.0–34.0)
MCHC: 31.5 g/dL (ref 30.0–36.0)
MCV: 104 fL — ABNORMAL HIGH (ref 80.0–100.0)
Platelets: 194 10*3/uL (ref 150–400)
RBC: 3.51 MIL/uL — ABNORMAL LOW (ref 3.87–5.11)
RDW: 13.9 % (ref 11.5–15.5)
WBC: 5.6 10*3/uL (ref 4.0–10.5)
nRBC: 0 % (ref 0.0–0.2)

## 2022-07-14 LAB — PROTIME-INR
INR: 0.9 (ref 0.8–1.2)
Prothrombin Time: 12.5 seconds (ref 11.4–15.2)

## 2022-07-14 LAB — ETHANOL: Alcohol, Ethyl (B): <10 mg/dL (ref <10)

## 2022-07-14 LAB — APTT: aPTT: 26 seconds (ref 24–36)

## 2022-07-14 NOTE — ED Provider Triage Note (Signed)
Emergency Medicine Provider Triage Evaluation Note  Dawn Prince , a 62 y.o. female  was evaluated in triage.  Pt complains of progressively worsening floaters and flashing lights in the left eye times greater than 3 days.  Patient unable to clarify if she truly has vision loss in that eye but denies double vision.  History of CVA in the past on aspirin daily.  Was unable to get appointment with her ophthalmologist, became anxious tonight after reading on the Internet and prompted ED visit.  Review of Systems  Positive: As above Negative: Numbness tingling or weakness in the extremities, facial droop  Physical Exam  BP (!) 160/88   Pulse 71   Temp 98.6 F (37 C)   Resp 18   Ht 5' (1.524 m)   Wt 55.3 kg   SpO2 98%   BMI 23.83 kg/m  Gen:   Awake, no distress   Resp:  Normal effort  MSK:   Moves extremities without difficulty  Other:  PERRL, EOMI, vision grossly intact.  Symmetric strength and sensation bilateral lower extremities, neuroexam nonfocal  Medical Decision Making  Medically screening exam initiated at 11:09 PM.  Appropriate orders placed.  Dawn Prince was informed that the remainder of the evaluation will be completed by another provider, this initial triage assessment does not replace that evaluation, and the importance of remaining in the ED until their evaluation is complete.  This chart was dictated using voice recognition software, Dragon. Despite the best efforts of this provider to proofread and correct errors, errors may still occur which can change documentation meaning.    Emeline Darling, PA-C 07/14/22 [id number]

## 2022-07-14 NOTE — ED Triage Notes (Signed)
Pt reports she has "things floating" in her left eye X3 days. Pt states she did have "a few flashed of light in a dark room."  No pain.  No change in vision.

## 2022-07-15 ENCOUNTER — Emergency Department (HOSPITAL_COMMUNITY): Payer: BC Managed Care – PPO

## 2022-07-15 DIAGNOSIS — H43392 Other vitreous opacities, left eye: Secondary | ICD-10-CM | POA: Diagnosis not present

## 2022-07-15 DIAGNOSIS — H53132 Sudden visual loss, left eye: Secondary | ICD-10-CM | POA: Diagnosis not present

## 2022-07-15 DIAGNOSIS — H21342 Primary cyst of pars plana, left eye: Secondary | ICD-10-CM | POA: Diagnosis not present

## 2022-07-15 DIAGNOSIS — H35363 Drusen (degenerative) of macula, bilateral: Secondary | ICD-10-CM | POA: Diagnosis not present

## 2022-07-15 DIAGNOSIS — H353131 Nonexudative age-related macular degeneration, bilateral, early dry stage: Secondary | ICD-10-CM | POA: Diagnosis not present

## 2022-07-15 DIAGNOSIS — H532 Diplopia: Secondary | ICD-10-CM | POA: Diagnosis not present

## 2022-07-15 DIAGNOSIS — H43812 Vitreous degeneration, left eye: Secondary | ICD-10-CM | POA: Diagnosis not present

## 2022-07-15 NOTE — Discharge Instructions (Signed)
You were seen in the ER today for your floaters in  your visual field. Your physical exam and workup were very reassuring.  There is no sign of emergent problem with your eye at this time.  Please follow-up with the ophthalmologist listed below for your primary eye doctor in the and return to the ER with any severe symptoms. next 2-3 days

## 2022-07-15 NOTE — ED Notes (Signed)
Pt continues to be free of neuro deficits at this time.

## 2022-07-15 NOTE — ED Provider Notes (Addendum)
[facility] Provider Note   CSN: [id number] Arrival date & time: 07/14/22  2153   History  Chief Complaint  Patient presents with   Eye Problem    Dawn Prince is a 62 y.o. female who presents with concern for 3 days of floaters in her left visual field.  Patient now clearly able to communicate that she has not had any loss of vision, blurry or double vision.  No pain in the eye.  No recent trauma to the eye.  Does not wear contacts.  Was unable to get into her ophthalmologist this week.  States that she was reading on [employer] this evening and became concerned prompting her ED visit.  Patient has history of non-Hodgkin's lymphoma currently being observed not in active treatment.  Also has history of hypothyroidism, hyperlipidemia and CVA in the past.  Denies all other neurologic symptoms.  On aspirin but no full anticoagulation.  HPI     Home Medications Prior to Admission medications   Medication Sig Start Date End Date Taking? Authorizing Provider  ascorbic acid (VITAMIN C) 500 MG tablet Take 1 tablet by mouth daily. 06/20/20   [provider]  aspirin EC 81 MG tablet Take 1 tablet (81 mg total) by mouth daily. 02/13/21   Thurnell Lose, MD  Ca Q000111Q (CALCIUM-FOLIC ACID PLUS D) [id number] MG WAFR     [provider]  chlorpheniramine (CHLOR-TRIMETON) 4 MG tablet Take 4 mg by mouth daily as needed for allergies. 07/11/20   [provider]  ELDERBERRY PO Take 1 tablet by mouth daily.    [provider]  FLUoxetine (PROZAC) 20 MG capsule Take 60 mg by mouth daily. 06/13/20   [provider]  Multiple Vitamin (MULTIVITAMIN ADULT PO) Take by mouth.    [provider]  Omega 3 1000 MG CAPS Take 1,000 mg by mouth daily.    [provider]  ondansetron (ZOFRAN) 4 MG tablet Take 1 tablet (4 mg total) by mouth every 8 (eight) hours as needed for nausea or  vomiting. 04/26/21   Mosher, Vida Roller A, PA-C  Probiotic Product (PROBIOTIC BLEND PO) Take 1 capsule by mouth daily.    [provider]  QUEtiapine (SEROQUEL) 25 MG tablet Take 25 mg by mouth daily at 12 noon. 01/31/22   [provider]  ramipril (ALTACE) 1.25 MG capsule Take 1.25 mg by mouth daily.    [provider]  zinc gluconate 50 MG tablet Take 50 mg by mouth as needed.    [provider]      Allergies    Voltaren [diclofenac sodium] and Nsaids    Review of Systems   Review of Systems  Eyes:        Floaters in the left visual field.    Physical Exam Updated Vital Signs BP (!) 169/87   Pulse 92   Temp 98.3 F (36.8 C) (Oral)   Resp 18   Ht 5' (1.524 m)   Wt 55.3 kg   SpO2 98%   BMI 23.83 kg/m  Physical Exam Vitals and nursing note reviewed.  Constitutional:      Appearance: She is not ill-appearing or toxic-appearing.  HENT:     Head: Normocephalic and atraumatic.     Nose: Nose normal.     Mouth/Throat:     Mouth: Mucous membranes are moist.     Pharynx: No oropharyngeal exudate or posterior oropharyngeal erythema.  Eyes:  General: Lids are normal. Vision grossly intact. Gaze aligned appropriately. No visual field deficit.       Right eye: No discharge.        Left eye: No discharge.     Extraocular Movements: Extraocular movements intact.     Conjunctiva/sclera: Conjunctivae normal.     Pupils: Pupils are equal, round, and reactive to light.     Visual Fields: Right eye visual fields normal and left eye visual fields normal.     Comments: Per ED RN J. Gloster Visual acuity bilaterally near 20/20, distant 20/25.  Visual acuity in the left eye 20/20, distance 20/25 Visual acuity in the right eye 20/20   Neck:     Trachea: Trachea and phonation normal.  Cardiovascular:     Rate and Rhythm: Normal rate and regular rhythm.     Pulses: Normal pulses.     Heart sounds: Normal heart sounds. No murmur heard. Pulmonary:      Effort: Pulmonary effort is normal. No respiratory distress.     Breath sounds: Normal breath sounds. No wheezing or rales.  Abdominal:     General: Bowel sounds are normal. There is no distension.     Palpations: Abdomen is soft.     Tenderness: There is no abdominal tenderness. There is no guarding or rebound.  Musculoskeletal:        General: No deformity.     Cervical back: Normal range of motion and neck supple.  Lymphadenopathy:     Cervical: No cervical adenopathy.  Skin:    General: Skin is warm and dry.  Neurological:     Mental Status: She is alert and oriented to person, place, and time. Mental status is at baseline.     GCS: GCS eye subscore is 4. GCS verbal subscore is 5. GCS motor subscore is 6.     Motor: Motor function is intact.     Coordination: Coordination is intact.     Gait: Gait is intact.  Psychiatric:        Mood and Affect: Mood normal.     ED Results / Procedures / Treatments   Labs (all labs ordered are listed, but only abnormal results are displayed) Labs Reviewed  CBC - Abnormal; Notable for the following components:      Result Value   RBC 3.51 (*)    Hemoglobin 11.5 (*)    MCV 104.0 (*)    All other components within normal limits  COMPREHENSIVE METABOLIC PANEL - Abnormal; Notable for the following components:   Glucose, Bld 117 (*)    Albumin 3.3 (*)    Total Bilirubin 0.2 (*)    All other components within normal limits  PROTIME-INR  APTT  DIFFERENTIAL  ETHANOL    EKG None  Radiology CT Orbits Wo Contrast  Result Date: 07/15/2022 CLINICAL DATA:  Left eye vision change EXAM: CT ORBITS WITHOUT CONTRAST TECHNIQUE: Multidetector CT imaging of the orbits was performed using the standard protocol without intravenous contrast. Multiplanar CT image reconstructions were also generated. RADIATION DOSE REDUCTION: This exam was performed according to the departmental dose-optimization program which includes automated exposure control,  adjustment of the mA and/or kV according to patient size and/or use of iterative reconstruction technique. COMPARISON:  No prior CT orbits, correlation is made with CT head 07/15/2022 FINDINGS: Orbits: No orbital mass or evidence of inflammation. Normal appearance of the globes, optic nerve-sheath complexes, extraocular muscles, orbital fat and lacrimal glands. Visible paranasal sinuses: Clear. Soft tissues: Normal. Osseous: No  fracture or aggressive lesion. Limited intracranial: Negative. IMPRESSION: Normal CT of the orbits. Electronically Signed   By: Merilyn Baba M.D.   On: 07/15/2022 00:48   CT HEAD WO CONTRAST  Result Date: 07/15/2022 CLINICAL DATA:  Diplopia EXAM: CT HEAD WITHOUT CONTRAST TECHNIQUE: Contiguous axial images were obtained from the base of the skull through the vertex without intravenous contrast. RADIATION DOSE REDUCTION: This exam was performed according to the departmental dose-optimization program which includes automated exposure control, adjustment of the mA and/or kV according to patient size and/or use of iterative reconstruction technique. COMPARISON:  MRI head 02/09/2021 FINDINGS: Brain: No evidence of large-territorial acute infarction. No parenchymal hemorrhage. No mass lesion. No extra-axial collection. No mass effect or midline shift. No hydrocephalus. Basilar cisterns are patent. Vascular: No hyperdense vessel. Skull: No acute fracture or focal lesion. Sinuses/Orbits: Paranasal sinuses and mastoid air cells are clear. The orbits are unremarkable. Other: None. IMPRESSION: No acute intracranial abnormality. Electronically Signed   By: Iven Finn M.D.   On: 07/15/2022 00:43    Procedures Ultrasound ED Ocular  Date/Time: 07/15/2022 3:31 AM  Performed by: Emeline Darling, PA-C Authorized by: Emeline Darling, PA-C   PROCEDURE DETAILS:    Indications comment:  Floaters in the left eye   Assessed:  Left eye   Left eye axial view: obtained     Left eye  saggital view: obtained     Images: archived     Limitations:  None LEFT EYE FINDINGS:     no foreign body noted in left eye    left eye lens not dislodged    no left eye increased optic nerve sheath diameter    no evidence of retinal detachment of the left eye    no vitreous hemorrhage in left eye     Medications Ordered in ED Medications - No data to display  ED Course/ Medical Decision Making/ A&P Clinical Course as of 07/15/22 0340  Mon Jul 15, 2022  0104 CT HEAD WO CONTRAST [RS]    Clinical Course User Index [RS] Emeline Darling, PA-C                             Medical Decision Making 62 year old female who presents with concern for floaters in the left eye for the last 3 days.  Hypertensive on intake and vital signs otherwise normal.  Cardiopulmonary abdominal seems benign.  Neurologic exam is nonfocal.  Ocular exam is reassuring, PERRL, EOMI, full visual fields and reassuring visual acuity.  DDx for floaters in the eye includes but is not limited to vitreous detachment, retinal tear or detachment, posterior uveitis, vitreous hemorrhage, migraine with aura.  Amount and/or Complexity of Data Reviewed Labs: ordered.    Details: CBC without leukocytosis, mild anemia with hemoglobin 11.5.  CMP unremarkable, alcohol and coags are normal Radiology: ordered. Decision-making details documented in ED Course.    Details: CT head negative for acute cranial abnormality, CT orbits unremarkable, visualized with provider. ECG/medicine tests:     Details:  EKG with normal sinus rhythm, no ischemic changes.  Repeat neurologic exam remains nonfocal.  CT and ultrasound imaging of the left eye are reassuring.  No obvious vitreous hemorrhage or retinal detachment.  Clinical picture not consistent with CVA.  Complications are for emergent underlying etiology that would warrant further ED workup or inpatient management is exceedingly low.  Recommend close outpatient follow-up with  ophthalmology.  Ophthalmologist contact information provided to  the patient.  No further workup warranted in the ER at this time.  Shalawn voiced understanding of her medical evaluation and treatment plan. Each of their questions answered to their expressed satisfaction.  Return precautions were given.  Patient is well-appearing, stable, and was discharged in good condition.  This chart was dictated using voice recognition software, Dragon. Despite the best efforts of this provider to proofread and correct errors, errors may still occur which can change documentation meaning.  Final Clinical Impression(s) / ED Diagnoses Final diagnoses:  Vitreous floaters of left eye    Rx / DC Orders ED Discharge Orders     None         Emeline Darling, PA-C 07/15/22 0340    Orpah Greek, MD 07/15/22 [phone]

## 2022-07-29 NOTE — Progress Notes (Signed)
Dawn Prince  [address] 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  She has been on observation only. She has had stable bilateral cervical and inguinal lymphadenopathy.  MRI imaging from February 2023 revealed extensive inguinal and retroperitoneal lymphadenopathy.  Her physical exam reveals a modest change in her cervical and supraclavicular adenopathy. PET scan in March revealed continued stability of mild adenopathy in the neck, chest, abdomen and pelvis with low level FDG and De[place]ville 2-3 category uptake. The latest CT in September of 2023 shows the numerous mildly enlarged nodes to be unchanged. Her lymphadenopathy is stable at this time.  Pain of right shoulder An ultrasound of the right axilla was done and reveals stable to slightly larger axillary lymph nodes measuring 3.3 cm and 3.0 cm. I do not feel this is the source of her pain as they have been present for years. She has been evaluated and is undergoing physical therapy and Cortisone injection.   B12 deficiency anemia She was treated with B12 injections then transitioned to oral B12.  She is no longer taking B12 as her B12 level has been elevated at the [facility].  On May 11, B12 was greater than 2000, iron studies and folate were normal.  She remains off B12 as her last level in June was over 1,000. Now it has dropped to 350 and so I had advised her to get back on oral B-12 and we will recheck her level later.   Plan She began to experience hives since December, 2023 and was prescribed Pepsid and Claritin. She is now on 100mg  of trazodone to help her insomnia. She takes one 600mg  calcium supplement once a day and I advised her to increase it to 2 a day since  her level is only 8.7. Her B-12 was at 1,018 nine months ago and is now 350 today. I suggested she start taking oral B-12 supplements about 500 mcg once daily. Her WBC is 4.6, hemoglobin is 11.4, and platelet count is 185,000. Her last CT of chest, abdomen, and pelvis was 01/29/2022 and I will want to repeat that in the next 6 months. I will see her back in 3 months with CBC, CMP, and B-12. The patient and her husband understand the plans discussed today and are in agreement with them.  She knows to contact our office if she develops concerns prior to her next appointment.   I provided 20 minutes of face-to-face time during this encounter and > 50% was spent counseling as documented under my assessment and plan.    I,[name],acting as a scribe for Derwood Kaplan, MD.,have documented all relevant documentation on the behalf of Derwood Kaplan, MD,as directed by  Derwood Kaplan, MD while in the presence of Derwood Kaplan, [place], [place] [address] Dept: [phone] Dept Fax: [phone]   No orders of the defined types were placed in this encounter.     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  Staging CT chest revealed adenopathy of the bilateral neck measuring up to 18 mm in diameter with left subclavian, bilateral axillary and subpectoral adenopathy, but no mediastinal nodes.  CT abdomen was  negative.  CT pelvis revealed bilateral external iliac nodes up to 11 mm in diameter.  She had B symptoms with severe night sweats and weight loss.  She has been on  observation only.  Due to her family history of breast cancer, she underwent testing for  hereditary breast and ovarian cancer with the [facility] myRisk Hereditary Cancer Gene panel test.  This did not reveal any clinically significant mutation.  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  Her [facility] breast cancer risk assessment showed her lifetime risk of breast cancer to be 29.7%, so annual breast MRI, in addition to mammogram is recommended.  Mammogram and MRI breast done in August 2019 did not reveal any evidence of malignancy.  She did go for a second opinion to [facility] regarding her lymphoma and they concurred with the approach of watchful waiting.    She presented to the emergency room in mid April 2021 due to increased urinary frequency and discomfort.  CT imaging revealed left sided obstructive uropathy with 3 mm calculus in the distal left ureter just proximal to the ureterovesical unction causing moderate hydroureteronephrosis and perinephric stranding.  Left greater than right iliac and pelvic lymphadenopathy, slightly greater than on 10/26/18, consistent with known history of lymphoma.  We did not repeat a scan in June as scheduled.  Annual screening bilateral mammogram from August 2021 was clear.  MRI breast has not been repeated due to her comorbidities.   CT neck in May 2022 revealed multiple lymph nodes in the neck bilaterally, with mild progression of lymph nodes on the right. There has been more significant progression of left level 4 and left supraclavicular lymph nodes compared to the prior study. Largest lymph node in the left supraclavicular region measures 35 x 17 mm. Findings were compatible with lymphoma. CT chest/abdomen/pelvis revealed no substantial interval change in exam. Bilateral supraclavicular, subpectoral, axillary, retroperitoneal, and pelvic lymphadenopathy was similar to prior. There were no definite findings of progression.  At her visit in September, the lymphoma remained stable. She continued to have sciatic pain unrelieved  with gabapentin 800 mg TID. She had steroid injections of the spine with improvement.    Bilateral screening mammogram in January revealed a possible asymmetry in the left breast.  She underwent left diagnostic mammogram and the asymmetry disperses with compression, so it is felt to be dense fibroglandular tissue.  There is no evidence of malignancy.  Bilateral screening in 1 year was recommended.  MRI of the right hip from February 2023 revealed extensive inguinal and retroperitoneal lymphadenopathy. There are high-grade tears of the right gluteus numbness greater than medius with background tendinosis and trochanteric bursitis, and bilateral hamstring origin tendinosis with mild partial tearing. She has mild bilateral hip osteoarthritis, and  right sided labral tear. She states that she has chronic palpable lymph nodes of the left neck and supraclavicular area.  She therefore underwent PET scan in March, which revealed stable disease when compared to previous CT images.   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  11/11/2015 Cancer Staging   Staging form: Hodgkin and Non-Hodgkin Lymphoma, AJCC 8th Edition - Clinical stage from 11/11/2015: Stage III (Small lymphocytic leukemia) - Signed by Derwood Kaplan, MD on 01/28/2021 Histopathologic type: Malignant  lymphoma, small B lymphocytic, NOS (see also M-9823/3) Stage prefix: Initial diagnosis Diagnostic confirmation: Positive histology PLUS positive immunophenotyping and/or positive genetic studies Specimen type: Core Needle Biopsy Staged by: Managing physician Stage used in treatment planning: Yes National guidelines used in treatment planning: Yes Type of national guideline used in treatment planning: NCCN Staging comments: Watchful waiting   06/20/2020 Initial Diagnosis   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)   Chronic lymphocytic leukemia (CLL), B-cell  10/31/2015 Initial Diagnosis    Chronic lymphocytic leukemia (CLL), B-cell ([place])   11/11/2015 Cancer Staging   Staging form: Chronic Lymphocytic Leukemia / Small Lymphocytic Lymphoma, AJCC 8th Edition - Clinical stage from 11/11/2015: Modified Rai Stage III (Modified Rai risk: High, Binet: Stage B, Lugano: Stage III, Lymphocytosis: Absent, Adenopathy: Present, Organomegaly: Absent, Anemia: Absent, Thrombocytopenia: Absent) - Signed by Derwood Kaplan, MD on 04/05/2021 Histopathologic type: B-cell lymphocytic leukemia/small lymphocytic lymphoma (see also M-9670/3) Stage prefix: Initial diagnosis Stage used in treatment planning: Yes National guidelines used in treatment planning: Yes Type of national guideline used in treatment planning: NCCN       INTERVAL HISTORY:  Dawn Prince is here today for repeat clinical assessment for small cell B cell lymphoma. Patient states that she is ok and a little down after her mother's passing 2 weeks ago. She complains of pain in her hips, tendonitis, and headaches nightly. She also informed me about experiencing oral numbness, hoarseness in her voice, and a cough with cloudy phlegm. She also has had hives since December and was prescribed Pepsid and Claritin. She is now on 100mg  of trazodone to help her insomnia. She takes one 600mg  calcium supplement once a day and I advised her to increase it to 2 a day able to tolerate. Her B-12 was at 1,018 nine months ago and is now at 350 today. I suggested she start taking oral B-12 supplements about 500 mcg once daily. Her WBC is 4.6, hemoglobin is 11.4, and platelet count is 185,000. Her last CT of chest, abdomen, and pelvis was 01/29/2022 and I will want to repeat that in the next 6 months. I will see her back in 3 months with CBC, CMP, B-12.  She denies signs of infection such as sore throat, sinus drainage, cough, or urinary symptoms.  She denies fevers or recurrent chills. She denies pain. She denies nausea, vomiting, chest pain, dyspnea or cough. Her  weight has increased 3 pounds over last 2.5 weeks . She is accompanied at today's visit with her husband.    REVIEW OF SYSTEMS:  Review of Systems  Constitutional:  Positive for fatigue. Negative for appetite change, chills, diaphoresis, fever and unexpected weight change.  HENT:   Positive for voice change. Negative for hearing loss, lump/mass, mouth sores, nosebleeds, sore throat, tinnitus and trouble swallowing.   Eyes:  Positive for eye problems (vitreous humor in the eye is detaching.). Negative for icterus.  Respiratory:  Positive for cough. Negative for chest tightness, hemoptysis, shortness of breath and wheezing.   Cardiovascular:  Positive for leg swelling (swollen ankles). Negative for chest pain and palpitations.  Gastrointestinal: Negative.  Negative for abdominal distention, abdominal pain, blood in stool, constipation, diarrhea, nausea, rectal pain and vomiting.  Endocrine: Negative.   Genitourinary:  Positive for frequency. Negative for bladder incontinence, difficulty urinating, dyspareunia, dysuria, hematuria, menstrual problem, nocturia, pelvic pain, vaginal bleeding and vaginal discharge.   Musculoskeletal:  Positive for arthralgias (right shoulder improved but still hurts). Negative for back pain, flank pain, gait  problem, myalgias, neck pain and neck stiffness.       Hip pain  Skin: Negative.  Negative for itching, rash and wound.       Hives.  Neurological:  Positive for headaches. Negative for dizziness, extremity weakness, gait problem, light-headedness, numbness, seizures and speech difficulty.  Hematological: Negative.  Negative for adenopathy. Does not bruise/bleed easily.  Psychiatric/Behavioral:  Positive for sleep disturbance. Negative for confusion, decreased concentration, depression and suicidal ideas. The patient is not nervous/anxious.     VITALS:  Blood pressure 129/79, pulse 86, temperature 98 F (36.7 C), temperature source Oral, resp. rate 16, height 5'  (1.524 m), weight 125 lb 8 oz (56.9 kg), SpO2 100 %.  Wt Readings from Last 3 Encounters:  07/31/22 125 lb 8 oz (56.9 kg)  07/14/22 122 lb (55.3 kg)  05/01/22 127 lb 9.6 oz (57.9 kg)    Body mass index is 24.51 kg/m.  Performance status (ECOG): 2 - Symptomatic, <50% confined to bed  PHYSICAL EXAM:  Physical Exam Vitals and nursing note reviewed. Exam conducted with a chaperone present.  Constitutional:      General: She is not in acute distress.    Appearance: Normal appearance. She is normal weight. She is not ill-appearing, toxic-appearing or diaphoretic.  HENT:     Head: Normocephalic and atraumatic.     Right Ear: Tympanic membrane, ear canal and external ear normal. There is no impacted cerumen.     Left Ear: Tympanic membrane, ear canal and external ear normal. There is no impacted cerumen.     Nose: Nose normal. No congestion or rhinorrhea.     Mouth/Throat:     Mouth: Mucous membranes are moist.     Pharynx: Oropharynx is clear. No oropharyngeal exudate or posterior oropharyngeal erythema.  Eyes:     General: No scleral icterus.       Right eye: No discharge.        Left eye: No discharge.     Extraocular Movements: Extraocular movements intact.     Conjunctiva/sclera: Conjunctivae normal.     Pupils: Pupils are equal, round, and reactive to light.  Neck:     Vascular: No carotid bruit.     Comments:   Cardiovascular:     Rate and Rhythm: Normal rate and regular rhythm.     Pulses: Normal pulses.     Heart sounds: Normal heart sounds. No murmur heard.    No friction rub. No gallop.  Pulmonary:     Effort: Pulmonary effort is normal. No respiratory distress.     Breath sounds: Normal breath sounds. No stridor. No wheezing, rhonchi or rales.  Chest:     Chest wall: No tenderness.  Abdominal:     General: Bowel sounds are normal. There is no distension.     Palpations: Abdomen is soft. There is no hepatomegaly, splenomegaly or mass.     Tenderness: There is no  abdominal tenderness. There is no right CVA tenderness, left CVA tenderness, guarding or rebound.     Hernia: No hernia is present.  Musculoskeletal:        General: No swelling, tenderness, deformity or signs of injury. Normal range of motion.     Cervical back: Normal range of motion and neck supple. No rigidity or tenderness.     Right lower leg: Edema (trace) present.     Left lower leg: Edema (trace) present.     Comments: 1 in the left axilla 1-2cm. Several small nodes in the right  axilla with one larger at 3+cm.  Lymphadenopathy:     Cervical: Cervical adenopathy present.     Right cervical: Posterior cervical adenopathy present. No superficial or deep cervical adenopathy.    Left cervical: Superficial cervical adenopathy and posterior cervical adenopathy present. No deep cervical adenopathy.     Upper Body:     Right upper body: Axillary adenopathy present. No supraclavicular or pectoral adenopathy.     Left upper body: Axillary adenopathy present. No supraclavicular or pectoral adenopathy.     Lower Body: Right inguinal adenopathy present. Left inguinal adenopathy present.     Comments: Posterior left cervical node that is 2-3cm Several left supraclavicular nodes 2-3cm. 1cm right posterior cervical node. Left inguinal area measuring 2cm lymph node. Right inguinal, small nodes.  Skin:    General: Skin is warm and dry.     Coloration: Skin is not jaundiced or pale.     Findings: No bruising, erythema, lesion or rash.  Neurological:     General: No focal deficit present.     Mental Status: She is alert and oriented to person, place, and time. Mental status is at baseline.     Cranial Nerves: No cranial nerve deficit.     Sensory: No sensory deficit.     Motor: No weakness.     Coordination: Coordination normal.     Gait: Gait normal.     Deep Tendon Reflexes: Reflexes normal.  Psychiatric:        Mood and Affect: Mood normal.        Behavior: Behavior normal.         Thought Content: Thought content normal.        Judgment: Judgment normal.     LABS:   CBC Ref Range & Units 1 d ago (07/30/22) 2 wk ago (07/14/22)  WBC Count 4.0 - 10.5 K/uL 4.6 5.6  RBC 3.87 - 5.11 MIL/uL 3.53 Low  3.51 Low   Hemoglobin 12.0 - 15.0 g/dL 11.4 Low  11.5 Low   HCT 36.0 - 46.0 % 36.7 36.5  MCV 80.0 - 100.0 fL 104.0 High  104.0 High   MCH 26.0 - 34.0 pg 32.3 32.8  MCHC 30.0 - 36.0 g/dL 31.1 31.5  RDW 11.5 - 15.5 % 14.0 13.9  Platelet Count 150 - 400 K/uL 185 194     CMP Ref Range & Units 1 d ago (07/30/22) 2 wk ago (07/14/22)  Sodium 135 - 145 mmol/L 141 139  Potassium 3.5 - 5.1 mmol/L 3.8 4.3  Chloride 98 - 111 mmol/L 106 103  CO2 22 - 32 mmol/L 27 26  Glucose, Bld 70 - 99 mg/dL 104 High  117 High  CM  BUN 8 - 23 mg/dL 14 18  Creatinine 0.44 - 1.00 mg/dL 0.91 0.79  Calcium 8.9 - 10.3 mg/dL 8.7 Low  9.3  Total Protein 6.5 - 8.1 g/dL 7.2 6.7  Albumin 3.5 - 5.0 g/dL 3.7 3.3 Low   AST 15 - 41 U/L 21 20  ALT 0 - 44 U/L 14 15  Alkaline Phosphatase 38 - 126 U/L 40 47  Total Bilirubin 0.3 - 1.2 mg/dL 0.1 Low  0.2 Low       Latest Ref Rng & Units 07/30/2022    9:09 AM 07/14/2022   11:26 PM 01/29/2022   12:00 AM  CBC  WBC 4.0 - 10.5 K/uL 4.6  5.6  3.6       3.6      Hemoglobin 12.0 - 15.0 g/dL  11.4  11.5  12.3       12.3      Hematocrit 36.0 - 46.0 % 36.7  36.5  37       37      Platelets 150 - 400 K/uL 185  194  219       219         This result is from an external source.   Multiple values from one day are sorted in reverse-chronological order      Latest Ref Rng & Units 07/30/2022    9:09 AM 07/14/2022   11:26 PM 01/29/2022   12:00 AM  CMP  Glucose 70 - 99 mg/dL 104  117    BUN 8 - 23 mg/dL 14  18  20       20       Creatinine 0.44 - 1.00 mg/dL 0.91  0.79  0.8       0.8      Sodium 135 - 145 mmol/L 141  139  138       138      Potassium 3.5 - 5.1 mmol/L 3.8  4.3  4.8       4.8      Chloride 98 - 111 mmol/L 106  103  100       100      CO2 22 - 32  mmol/L 27  26  30       30       Calcium 8.9 - 10.3 mg/dL 8.7  9.3  9.1       9.1      Total Protein 6.5 - 8.1 g/dL 7.2  6.7    Total Bilirubin 0.3 - 1.2 mg/dL 0.1  0.2    Alkaline Phos 38 - 126 U/L 40  47  63       63      AST 15 - 41 U/L 21  20  22       22       ALT 0 - 44 U/L 14  15  26       26          This result is from an external source.   Multiple values from one day are sorted in reverse-chronological order        Component Ref Range & Units 1 d ago 9 mo ago  Vitamin B-12 180 - 914 pg/mL 350 1,018 High  CM     Component Ref Range & Units 07/14/2022  Alcohol, Ethyl (B) <10 mg/dL <10   Component Ref Range & Units 07/14/2022  Prothrombin Time 11.4 - 15.2 seconds 12.5  INR 0.8 - 1.2 0.9   Component Ref Range & Units 07/14/2022  aPTT 24 - 36 seconds 26      No results found for: "CEA1", "CEA" / No results found for: "CEA1", "CEA" No results found for: "PSA1" No results found for: "[id number]" No results found for: "CAN125"  No results found for: "TOTALPROTELP", "ALBUMINELP", "A1GS", "A2GS", "BETS", "BETA2SER", "GAMS", "MSPIKE", "SPEI" Lab Results  Component Value Date   TIBC 331 07/23/2021   FERRITIN 22 07/23/2021   IRONPCTSAT 20 07/23/2021   Lab Results  Component Value Date   LDH 136 10/30/2021   LDH 142 07/20/2021   LDH 147 04/26/2021    STUDIES:  No results found.   EXAM: 03/09/2023 ULTRASOUND OF THE right AXILLA 03/14/22 IMPRESSION: Enlarged right axillary lymph nodes consistent with known lymphoma.  HISTORY:   Past Medical History:  Diagnosis Date   Allergy    seasonal   Arthritis    Bipolar 1 disorder ([place])    Cancer ([place])    non hodgkins lymphoma   Depression    GERD (gastroesophageal reflux disease)    History of degenerative disc disease    Hyperlipidemia    Increased risk of breast cancer 04/25/2021   Obstructive sleep apnea    Osteopenia    Scoliosis    Thyroid disease    hypothyroidism   Vitamin D deficiency      Past Surgical History:  Procedure Laterality Date   BREAST BIOPSY     BUBBLE STUDY  02/13/2021   Procedure: BUBBLE STUDY;  Surgeon: Skeet Latch, MD;  Location: [facility];  Service: Cardiovascular;;   CARPAL TUNNEL RELEASE Bilateral 2002   CESAREAN SECTION     x2   COLONOSCOPY  07/26/2008   Melanosis coli. Small internal hemorrhoids.    ENDOSCOPIC PLANTAR FASCIOTOMY     ESOPHAGOGASTRODUODENOSCOPY  03/17/2013   Mild gastritis. Status post esophageal dilatation.   LYMPH NODE BIOPSY     right hip repair torn tendon Right 09/2021   TEE WITHOUT CARDIOVERSION N/A 02/13/2021   Procedure: TRANSESOPHAGEAL ECHOCARDIOGRAM (TEE);  Surgeon: Skeet Latch, MD;  Location: [facility] ENDOSCOPY;  Service: Cardiovascular;  Laterality: N/A;   WISDOM TOOTH EXTRACTION      Family History  Problem Relation Age of Onset   Prostate cancer Father 22   Melanoma Father 35   High blood pressure Father    Breast cancer Maternal Aunt    Multiple myeloma Maternal Aunt    Breast cancer Maternal Aunt    Colon cancer Neg Hx    Colon polyps Neg Hx    Esophageal cancer Neg Hx    Rectal cancer Neg Hx    Stomach cancer Neg Hx     Social History:  reports that she has never smoked. She has never used smokeless tobacco. She reports that she does not currently use alcohol. She reports that she does not use drugs.The patient is accompanied by her husband today.  Allergies:  Allergies  Allergen Reactions   Voltaren [Diclofenac Sodium]     Thought it had something to do with her having a stroke   Nsaids     Had a stroke and worries it was related to the Diclofenac she took.    Current Medications: Current Outpatient Medications  Medication Sig Dispense Refill   Calcium Carb-Cholecalciferol (CALCIUM 600 + D PO) Take by mouth. Twice daily     Cholecalciferol (VITAMIN D) 50 MCG (2000 UT) CAPS Take by mouth.     Cyanocobalamin (B-12 PO) Take by mouth. Once a day     famotidine (PEPCID) 40 MG tablet  Take 40 mg by mouth daily.     loratadine (CLARITIN) 10 MG tablet Take 10 mg by mouth daily.     Multiple Vitamin (MULTIVITAMIN ADULT PO) Take by mouth. [facility] once a day     ramipril (ALTACE) 2.5 MG capsule Take by mouth.     traZODone (DESYREL) 100 MG tablet Take 100 mg by mouth at bedtime.     No current facility-administered medications for this visit.     I,[name],acting as a scribe for Derwood Kaplan, MD.,have documented all relevant documentation on the behalf of Derwood Kaplan, MD,as directed by  Derwood Kaplan, MD while in the presence of Derwood Kaplan, MD.

## 2022-07-30 ENCOUNTER — Inpatient Hospital Stay: Payer: BC Managed Care – PPO | Attending: Oncology

## 2022-07-30 DIAGNOSIS — C8302 Small cell B-cell lymphoma, intrathoracic lymph nodes: Secondary | ICD-10-CM

## 2022-07-30 DIAGNOSIS — E538 Deficiency of other specified B group vitamins: Secondary | ICD-10-CM | POA: Diagnosis not present

## 2022-07-30 DIAGNOSIS — Z8572 Personal history of non-Hodgkin lymphomas: Secondary | ICD-10-CM | POA: Diagnosis not present

## 2022-07-30 DIAGNOSIS — D519 Vitamin B12 deficiency anemia, unspecified: Secondary | ICD-10-CM

## 2022-07-30 DIAGNOSIS — M25511 Pain in right shoulder: Secondary | ICD-10-CM | POA: Insufficient documentation

## 2022-07-30 LAB — CMP (CANCER CENTER ONLY)
ALT: 14 U/L (ref 0–44)
AST: 21 U/L (ref 15–41)
Albumin: 3.7 g/dL (ref 3.5–5.0)
Alkaline Phosphatase: 40 U/L (ref 38–126)
Anion gap: 8 (ref 5–15)
BUN: 14 mg/dL (ref 8–23)
CO2: 27 mmol/L (ref 22–32)
Calcium: 8.7 mg/dL — ABNORMAL LOW (ref 8.9–10.3)
Chloride: 106 mmol/L (ref 98–111)
Creatinine: 0.91 mg/dL (ref 0.44–1.00)
GFR, Estimated: >60 mL/min (ref >60)
Glucose, Bld: 104 mg/dL — ABNORMAL HIGH (ref 70–99)
Potassium: 3.8 mmol/L (ref 3.5–5.1)
Sodium: 141 mmol/L (ref 135–145)
Total Bilirubin: 0.1 mg/dL — ABNORMAL LOW (ref 0.3–1.2)
Total Protein: 7.2 g/dL (ref 6.5–8.1)

## 2022-07-30 LAB — CBC WITH DIFFERENTIAL (CANCER CENTER ONLY)
Abs Immature Granulocytes: 0.02 10*3/uL (ref 0.00–0.07)
Basophils Absolute: 0 10*3/uL (ref 0.0–0.1)
Basophils Relative: 0 %
Eosinophils Absolute: 0.1 10*3/uL (ref 0.0–0.5)
Eosinophils Relative: 2 %
HCT: 36.7 % (ref 36.0–46.0)
Hemoglobin: 11.4 g/dL — ABNORMAL LOW (ref 12.0–15.0)
Immature Granulocytes: 0 %
Lymphocytes Relative: 57 %
Lymphs Abs: 2.6 10*3/uL (ref 0.7–4.0)
MCH: 32.3 pg (ref 26.0–34.0)
MCHC: 31.1 g/dL (ref 30.0–36.0)
MCV: 104 fL — ABNORMAL HIGH (ref 80.0–100.0)
Monocytes Absolute: 0.2 10*3/uL (ref 0.1–1.0)
Monocytes Relative: 4 %
Neutro Abs: 1.7 10*3/uL (ref 1.7–7.7)
Neutrophils Relative %: 37 %
Platelet Count: 185 10*3/uL (ref 150–400)
RBC: 3.53 MIL/uL — ABNORMAL LOW (ref 3.87–5.11)
RDW: 14 % (ref 11.5–15.5)
WBC Count: 4.6 10*3/uL (ref 4.0–10.5)
nRBC: 0 % (ref 0.0–0.2)

## 2022-07-30 LAB — VITAMIN B12: Vitamin B-12: 350 pg/mL (ref 180–914)

## 2022-07-31 ENCOUNTER — Other Ambulatory Visit: Payer: Self-pay | Admitting: Oncology

## 2022-07-31 ENCOUNTER — Encounter: Payer: Self-pay | Admitting: Oncology

## 2022-07-31 ENCOUNTER — Telehealth: Payer: Self-pay | Admitting: Oncology

## 2022-07-31 ENCOUNTER — Inpatient Hospital Stay: Payer: BC Managed Care – PPO | Admitting: Oncology

## 2022-07-31 VITALS — BP 129/79 | HR 86 | Temp 98.0°F | Resp 16 | Ht 60.0 in | Wt 125.5 lb

## 2022-07-31 DIAGNOSIS — C8302 Small cell B-cell lymphoma, intrathoracic lymph nodes: Secondary | ICD-10-CM

## 2022-07-31 DIAGNOSIS — D519 Vitamin B12 deficiency anemia, unspecified: Secondary | ICD-10-CM

## 2022-07-31 NOTE — Telephone Encounter (Signed)
Patient has been scheduled for follow-up visit per 07/31/22 LOS.  Pt given an appt calendar with date and time.

## 2022-08-01 DIAGNOSIS — H35363 Drusen (degenerative) of macula, bilateral: Secondary | ICD-10-CM | POA: Diagnosis not present

## 2022-08-01 DIAGNOSIS — H43392 Other vitreous opacities, left eye: Secondary | ICD-10-CM | POA: Diagnosis not present

## 2022-08-01 DIAGNOSIS — H353131 Nonexudative age-related macular degeneration, bilateral, early dry stage: Secondary | ICD-10-CM | POA: Diagnosis not present

## 2022-08-01 DIAGNOSIS — H21342 Primary cyst of pars plana, left eye: Secondary | ICD-10-CM | POA: Diagnosis not present

## 2022-08-01 DIAGNOSIS — H53142 Visual discomfort, left eye: Secondary | ICD-10-CM | POA: Diagnosis not present

## 2022-09-09 DIAGNOSIS — H35363 Drusen (degenerative) of macula, bilateral: Secondary | ICD-10-CM | POA: Diagnosis not present

## 2022-09-09 DIAGNOSIS — H43392 Other vitreous opacities, left eye: Secondary | ICD-10-CM | POA: Diagnosis not present

## 2022-09-09 DIAGNOSIS — H21342 Primary cyst of pars plana, left eye: Secondary | ICD-10-CM | POA: Diagnosis not present

## 2022-09-09 DIAGNOSIS — H353131 Nonexudative age-related macular degeneration, bilateral, early dry stage: Secondary | ICD-10-CM | POA: Diagnosis not present

## 2022-09-09 DIAGNOSIS — H43812 Vitreous degeneration, left eye: Secondary | ICD-10-CM | POA: Diagnosis not present

## 2022-10-20 DIAGNOSIS — R051 Acute cough: Secondary | ICD-10-CM | POA: Diagnosis not present

## 2022-10-20 DIAGNOSIS — J988 Other specified respiratory disorders: Secondary | ICD-10-CM | POA: Diagnosis not present

## 2022-10-20 DIAGNOSIS — R509 Fever, unspecified: Secondary | ICD-10-CM | POA: Diagnosis not present

## 2022-10-24 DIAGNOSIS — J019 Acute sinusitis, unspecified: Secondary | ICD-10-CM | POA: Diagnosis not present

## 2022-10-24 DIAGNOSIS — R059 Cough, unspecified: Secondary | ICD-10-CM | POA: Diagnosis not present

## 2022-10-24 DIAGNOSIS — J3489 Other specified disorders of nose and nasal sinuses: Secondary | ICD-10-CM | POA: Diagnosis not present

## 2022-10-24 DIAGNOSIS — R509 Fever, unspecified: Secondary | ICD-10-CM | POA: Diagnosis not present

## 2022-10-24 DIAGNOSIS — J189 Pneumonia, unspecified organism: Secondary | ICD-10-CM | POA: Diagnosis not present

## 2022-10-24 DIAGNOSIS — R5383 Other fatigue: Secondary | ICD-10-CM | POA: Diagnosis not present

## 2022-10-24 NOTE — Progress Notes (Incomplete)
[facility]  [address] 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  She has been on observation only. She has had stable bilateral cervical and inguinal lymphadenopathy.  MRI imaging from February 2023 revealed extensive inguinal and retroperitoneal lymphadenopathy.  Her physical exam reveals a modest change in her cervical and supraclavicular adenopathy. PET scan in March revealed continued stability of mild adenopathy in the neck, chest, abdomen and pelvis with low level FDG and Deauville 2-3 category uptake. The latest CT in September of 2023 shows the numerous mildly enlarged nodes to be unchanged. Her lymphadenopathy is stable at this time.  Pain of right shoulder An ultrasound of the right axilla was done and reveals stable to slightly larger axillary lymph nodes measuring 3.3 cm and 3.0 cm. I do not feel this is the source of her pain as they have been present for years. She has been evaluated and is undergoing physical therapy and Cortisone injection.   B12 deficiency anemia She was treated with B12 injections then transitioned to oral B12.  She is no longer taking B12 as her B12 level has been elevated at the [facility] office.  On May 11, B12 was greater than 2000, iron studies and folate were normal.  She remains off B12 as her last level in June was over 1,000. Now it has dropped to 350 and so I had advised her to get back on oral B-12 and we will recheck her level later.   Plan She began to experience hives since December, 2023 and was prescribed Pepsid and Claritin. She is now on 100mg  of trazodone to help her insomnia. She takes one 600mg  calcium supplement once a day and I advised her to increase it to 2 a day since  her level is only 8.7. Her B-12 was at 1,018 nine months ago and is now 350 today. I suggested she start taking oral B-12 supplements about 500 mcg once daily. Her WBC is 4.6, hemoglobin is 11.4, and platelet count is 185,000. Her last CT of chest, abdomen, and pelvis was 01/29/2022 and I will want to repeat that in the next 6 months. I will see her back in 3 months with CBC, CMP, and B-12. The patient and her husband understand the plans discussed today and are in agreement with them.  She knows to contact our office if she develops concerns prior to her next appointment.   I provided 20 minutes of face-to-face time during this encounter and > 50% was spent counseling as documented under my assessment and plan.    I,[name],acting as a scribe for Dellia Beckwith, MD.,have documented all relevant documentation on the behalf of Dellia Beckwith, MD,as directed by  Dellia Beckwith, MD while in the presence of Dellia Beckwith, MD.   [facility] AT [facility] [address] [place] [address] Dept: [phone] Dept Fax: [phone]   No orders of the defined types were placed in this encounter.     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  Staging CT chest revealed adenopathy of the bilateral neck measuring up to 18 mm in diameter with left subclavian, bilateral axillary and subpectoral adenopathy, but no mediastinal nodes.  CT abdomen was  negative.  CT pelvis revealed bilateral external iliac nodes up to 11 mm in diameter.  She had B symptoms with severe night sweats and weight loss.  She has been on  observation only.  Due to her family history of breast cancer, she underwent testing for hereditary  breast and ovarian cancer with the [employer] myRisk Hereditary Cancer Gene panel test.  This did not reveal any clinically significant mutation.  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  Her [facility] breast cancer risk assessment showed her lifetime risk of breast cancer to be 29.7%, so annual breast MRI, in addition to mammogram is recommended.  Mammogram and MRI breast done in August 2019 did not reveal any evidence of malignancy.  She did go for a second opinion to [facility] regarding her lymphoma and they concurred with the approach of watchful waiting.    She presented to the emergency room in mid April 2021 due to increased urinary frequency and discomfort.  CT imaging revealed left sided obstructive uropathy with 3 mm calculus in the distal left ureter just proximal to the ureterovesical unction causing moderate hydroureteronephrosis and perinephric stranding.  Left greater than right iliac and pelvic lymphadenopathy, slightly greater than on 10/26/18, consistent with known history of lymphoma.  We did not repeat a scan in June as scheduled.  Annual screening bilateral mammogram from August 2021 was clear.  MRI breast has not been repeated due to her comorbidities.   CT neck in May 2022 revealed multiple lymph nodes in the neck bilaterally, with mild progression of lymph nodes on the right. There has been more significant progression of left level 4 and left supraclavicular lymph nodes compared to the prior study. Largest lymph node in the left supraclavicular region measures 35 x 17 mm. Findings were compatible with lymphoma. CT chest/abdomen/pelvis revealed no substantial interval change in exam. Bilateral supraclavicular, subpectoral, axillary, retroperitoneal, and pelvic lymphadenopathy was similar to prior. There were no definite findings of progression.  At her visit in September, the lymphoma remained stable. She continued to have sciatic pain unrelieved with  gabapentin 800 mg TID. She had steroid injections of the spine with improvement.    Bilateral screening mammogram in January revealed a possible asymmetry in the left breast.  She underwent left diagnostic mammogram and the asymmetry disperses with compression, so it is felt to be dense fibroglandular tissue.  There is no evidence of malignancy.  Bilateral screening in 1 year was recommended.  MRI of the right hip from February 2023 revealed extensive inguinal and retroperitoneal lymphadenopathy. There are high-grade tears of the right gluteus numbness greater than medius with background tendinosis and trochanteric bursitis, and bilateral hamstring origin tendinosis with mild partial tearing. She has mild bilateral hip osteoarthritis, and  right sided labral tear. She states that she has chronic palpable lymph nodes of the left neck and supraclavicular area.  She therefore underwent PET scan in March, which revealed stable disease when compared to previous CT images.   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)  11/11/2015 Cancer Staging   Staging form: Hodgkin and Non-Hodgkin Lymphoma, AJCC 8th Edition - Clinical stage from 11/11/2015: Stage III (Small lymphocytic leukemia) - Signed by Dellia Beckwith, MD on 01/28/2021 Histopathologic type: Malignant lymphoma,  small B lymphocytic, NOS (see also M-9823/3) Stage prefix: Initial diagnosis Diagnostic confirmation: Positive histology PLUS positive immunophenotyping and/or positive genetic studies Specimen type: Core Needle Biopsy Staged by: Managing physician Stage used in treatment planning: Yes National guidelines used in treatment planning: Yes Type of national guideline used in treatment planning: NCCN Staging comments: Watchful waiting   06/20/2020 Initial Diagnosis   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)   Chronic lymphocytic leukemia (CLL), B-cell (HCC)  10/31/2015 Initial  Diagnosis   Chronic lymphocytic leukemia (CLL), B-cell (HCC)   11/11/2015 Cancer Staging   Staging form: Chronic Lymphocytic Leukemia / Small Lymphocytic Lymphoma, AJCC 8th Edition - Clinical stage from 11/11/2015: Modified Rai Stage III (Modified Rai risk: High, Binet: Stage B, Lugano: Stage III, Lymphocytosis: Absent, Adenopathy: Present, Organomegaly: Absent, Anemia: Absent, Thrombocytopenia: Absent) - Signed by Dellia Beckwith, MD on 04/05/2021 Histopathologic type: B-cell lymphocytic leukemia/small lymphocytic lymphoma (see also M-9670/3) Stage prefix: Initial diagnosis Stage used in treatment planning: Yes National guidelines used in treatment planning: Yes Type of national guideline used in treatment planning: NCCN       INTERVAL HISTORY:  Dawn Prince is here today for repeat clinical assessment for small cell B cell lymphoma. Patient states that she is *** and ***.     She denies signs of infection such as sore throat, sinus drainage, cough, or urinary symptoms.  She denies fevers or recurrent chills. She denies pain. She denies nausea, vomiting, chest pain, dyspnea or cough. Her appetite is *** and her weight {Weight change:10426}.\  and a little down after her mother's passing 2 weeks ago. She complains of pain in her hips, tendonitis, and headaches nightly. She also informed me about experiencing oral numbness, hoarseness in her voice, and a cough with cloudy phlegm. She also has had hives since December and was prescribed Pepsid and Claritin. She is now on 100mg  of trazodone to help her insomnia. She takes one 600mg  calcium supplement once a day and I advised her to increase it to 2 a day able to tolerate. Her B-12 was at 1,018 nine months ago and is now at 350 today. I suggested she start taking oral B-12 supplements about 500 mcg once daily. Her WBC is 4.6, hemoglobin is 11.4, and platelet count is 185,000. Her last CT of chest, abdomen, and pelvis was 01/29/2022 and I will want to  repeat that in the next 6 months. I will see her back in 3 months with CBC, CMP, B-12.  She denies signs of infection such as sore throat, sinus drainage, cough, or urinary symptoms.  She denies fevers or recurrent chills. She denies pain. She denies nausea, vomiting, chest pain, dyspnea or cough. Her weight has increased 3 pounds over last 2.5 weeks . She is accompanied at today's visit with her husband.    REVIEW OF SYSTEMS:  Review of Systems  Constitutional:  Positive for fatigue. Negative for appetite change, chills, diaphoresis, fever and unexpected weight change.  HENT:   Positive for voice change. Negative for hearing loss, lump/mass, mouth sores, nosebleeds, sore throat, tinnitus and trouble swallowing.   Eyes:  Positive for eye problems (vitreous humor in the eye is detaching.). Negative for icterus.  Respiratory:  Positive for cough. Negative for chest tightness, hemoptysis, shortness of breath and wheezing.   Cardiovascular:  Positive for leg swelling (swollen ankles). Negative for chest pain and palpitations.  Gastrointestinal: Negative.  Negative for abdominal distention, abdominal pain, blood in stool, constipation, diarrhea, nausea, rectal pain and  vomiting.  Endocrine: Negative.   Genitourinary:  Positive for frequency. Negative for bladder incontinence, difficulty urinating, dyspareunia, dysuria, hematuria, menstrual problem, nocturia, pelvic pain, vaginal bleeding and vaginal discharge.   Musculoskeletal:  Positive for arthralgias (right shoulder improved but still hurts). Negative for back pain, flank pain, gait problem, myalgias, neck pain and neck stiffness.       Hip pain  Skin: Negative.  Negative for itching, rash and wound.       Hives.  Neurological:  Positive for headaches. Negative for dizziness, extremity weakness, gait problem, light-headedness, numbness, seizures and speech difficulty.  Hematological: Negative.  Negative for adenopathy. Does not bruise/bleed easily.   Psychiatric/Behavioral:  Positive for sleep disturbance. Negative for confusion, decreased concentration, depression and suicidal ideas. The patient is not nervous/anxious.     VITALS:  There were no vitals taken for this visit.  Wt Readings from Last 3 Encounters:  07/31/22 125 lb 8 oz (56.9 kg)  07/14/22 122 lb (55.3 kg)  05/01/22 127 lb 9.6 oz (57.9 kg)    There is no height or weight on file to calculate BMI.  Performance status (ECOG): 2 - Symptomatic, <50% confined to bed  PHYSICAL EXAM:  Physical Exam Vitals and nursing note reviewed. Exam conducted with a chaperone present.  Constitutional:      General: She is not in acute distress.    Appearance: Normal appearance. She is normal weight. She is not ill-appearing, toxic-appearing or diaphoretic.  HENT:     Head: Normocephalic and atraumatic.     Right Ear: Tympanic membrane, ear canal and external ear normal. There is no impacted cerumen.     Left Ear: Tympanic membrane, ear canal and external ear normal. There is no impacted cerumen.     Nose: Nose normal. No congestion or rhinorrhea.     Mouth/Throat:     Mouth: Mucous membranes are moist.     Pharynx: Oropharynx is clear. No oropharyngeal exudate or posterior oropharyngeal erythema.  Eyes:     General: No scleral icterus.       Right eye: No discharge.        Left eye: No discharge.     Extraocular Movements: Extraocular movements intact.     Conjunctiva/sclera: Conjunctivae normal.     Pupils: Pupils are equal, round, and reactive to light.  Neck:     Vascular: No carotid bruit.     Comments:   Cardiovascular:     Rate and Rhythm: Normal rate and regular rhythm.     Pulses: Normal pulses.     Heart sounds: Normal heart sounds. No murmur heard.    No friction rub. No gallop.  Pulmonary:     Effort: Pulmonary effort is normal. No respiratory distress.     Breath sounds: Normal breath sounds. No stridor. No wheezing, rhonchi or rales.  Chest:     Chest wall:  No tenderness.  Abdominal:     General: Bowel sounds are normal. There is no distension.     Palpations: Abdomen is soft. There is no hepatomegaly, splenomegaly or mass.     Tenderness: There is no abdominal tenderness. There is no right CVA tenderness, left CVA tenderness, guarding or rebound.     Hernia: No hernia is present.  Musculoskeletal:        General: No swelling, tenderness, deformity or signs of injury. Normal range of motion.     Cervical back: Normal range of motion and neck supple. No rigidity or tenderness.  Right lower leg: Edema (trace) present.     Left lower leg: Edema (trace) present.     Comments: 1 in the left axilla 1-2cm. Several small nodes in the right axilla with one larger at 3+cm.  Lymphadenopathy:     Cervical: Cervical adenopathy present.     Right cervical: Posterior cervical adenopathy present. No superficial or deep cervical adenopathy.    Left cervical: Superficial cervical adenopathy and posterior cervical adenopathy present. No deep cervical adenopathy.     Upper Body:     Right upper body: Axillary adenopathy present. No supraclavicular or pectoral adenopathy.     Left upper body: Axillary adenopathy present. No supraclavicular or pectoral adenopathy.     Lower Body: Right inguinal adenopathy present. Left inguinal adenopathy present.     Comments: Posterior left cervical node that is 2-3cm Several left supraclavicular nodes 2-3cm. 1cm right posterior cervical node. Left inguinal area measuring 2cm lymph node. Right inguinal, small nodes.  Skin:    General: Skin is warm and dry.     Coloration: Skin is not jaundiced or pale.     Findings: No bruising, erythema, lesion or rash.  Neurological:     General: No focal deficit present.     Mental Status: She is alert and oriented to person, place, and time. Mental status is at baseline.     Cranial Nerves: No cranial nerve deficit.     Sensory: No sensory deficit.     Motor: No weakness.      Coordination: Coordination normal.     Gait: Gait normal.     Deep Tendon Reflexes: Reflexes normal.  Psychiatric:        Mood and Affect: Mood normal.        Behavior: Behavior normal.        Thought Content: Thought content normal.        Judgment: Judgment normal.     LABS:      Latest Ref Rng & Units 07/30/2022    9:09 AM 07/14/2022   11:26 PM 01/29/2022   12:00 AM  CBC  WBC 4.0 - 10.5 K/uL 4.6  5.6  3.6       3.6      Hemoglobin 12.0 - 15.0 g/dL 16.1  09.6  04.5       12.3      Hematocrit 36.0 - 46.0 % 36.7  36.5  37       37      Platelets 150 - 400 K/uL 185  194  219       219         This result is from an external source.   Multiple values from one day are sorted in reverse-chronological order       Latest Ref Rng & Units 07/30/2022    9:09 AM 07/14/2022   11:26 PM 01/29/2022   12:00 AM  CMP  Glucose 70 - 99 mg/dL 409  811    BUN 8 - 23 mg/dL 14  18  20       20       Creatinine 0.44 - 1.00 mg/dL 9.14  7.82  0.8       0.8      Sodium 135 - 145 mmol/L 141  139  138       138      Potassium 3.5 - 5.1 mmol/L 3.8  4.3  4.8       4.8      Chloride 98 -  111 mmol/L 106  103  100       100      CO2 22 - 32 mmol/L 27  26  30       30       Calcium 8.9 - 10.3 mg/dL 8.7  9.3  9.1       9.1      Total Protein 6.5 - 8.1 g/dL 7.2  6.7    Total Bilirubin 0.3 - 1.2 mg/dL 0.1  0.2    Alkaline Phos 38 - 126 U/L 40  47  63       63      AST 15 - 41 U/L 21  20  22       22       ALT 0 - 44 U/L 14  15  26       26          This result is from an external source.   Multiple values from one day are sorted in reverse-chronological order    Component Ref Range & Units 07/30/2022 11 mo ago 1 yr ago  Vitamin B-12 180 - 914 pg/mL 350 1,018 High  CM 199 CM   Component Ref Range & Units 07/14/2022  Alcohol, Ethyl (B) <10 mg/dL <09   Component Ref Range & Units 07/14/2022  Prothrombin Time 11.4 - 15.2 seconds 12.5  INR 0.8 - 1.2 0.9   Component Ref Range & Units 07/14/2022   aPTT 24 - 36 seconds 26      No results found for: "CEA1", "CEA" / No results found for: "CEA1", "CEA" No results found for: "PSA1" No results found for: "WJX914" No results found for: "CAN125"  No results found for: "TOTALPROTELP", "ALBUMINELP", "A1GS", "A2GS", "BETS", "BETA2SER", "GAMS", "MSPIKE", "SPEI" Lab Results  Component Value Date   TIBC 331 07/23/2021   FERRITIN 22 07/23/2021   IRONPCTSAT 20 07/23/2021   Lab Results  Component Value Date   LDH 136 10/30/2021   LDH 142 07/20/2021   LDH 147 04/26/2021    STUDIES:   EXAM: 03/08/2022 ULTRASOUND OF THE right AXILLA 03/14/22 IMPRESSION: Enlarged right axillary lymph nodes consistent with known lymphoma.        HISTORY:   Past Medical History:  Diagnosis Date   Allergy    seasonal   Arthritis    Bipolar 1 disorder (HCC)    Cancer (HCC)    non hodgkins lymphoma   Depression    GERD (gastroesophageal reflux disease)    History of degenerative disc disease    Hyperlipidemia    Increased risk of breast cancer 04/25/2021   Obstructive sleep apnea    Osteopenia    Scoliosis    Thyroid disease    hypothyroidism   Vitamin D deficiency     Past Surgical History:  Procedure Laterality Date   BREAST BIOPSY     BUBBLE STUDY  02/13/2021   Procedure: BUBBLE STUDY;  Surgeon: Chilton Si, MD;  Location: [facility] ENDOSCOPY;  Service: Cardiovascular;;   CARPAL TUNNEL RELEASE Bilateral 2002   CESAREAN SECTION     x2   COLONOSCOPY  07/26/2008   Melanosis coli. Small internal hemorrhoids.    ENDOSCOPIC PLANTAR FASCIOTOMY     ESOPHAGOGASTRODUODENOSCOPY  03/17/2013   Mild gastritis. Status post esophageal dilatation.   LYMPH NODE BIOPSY     right hip repair torn tendon Right 09/2021   TEE WITHOUT CARDIOVERSION N/A 02/13/2021   Procedure: TRANSESOPHAGEAL ECHOCARDIOGRAM (TEE);  Surgeon: Chilton Si, MD;  Location:  [facility] ENDOSCOPY;  Service: Cardiovascular;  Laterality: N/A;   WISDOM TOOTH EXTRACTION       Family History  Problem Relation Age of Onset   Prostate cancer Father 74   Melanoma Father 41   High blood pressure Father    Breast cancer Maternal Aunt    Multiple myeloma Maternal Aunt    Breast cancer Maternal Aunt    Colon cancer Neg Hx    Colon polyps Neg Hx    Esophageal cancer Neg Hx    Rectal cancer Neg Hx    Stomach cancer Neg Hx     Social History:  reports that she has never smoked. She has never used smokeless tobacco. She reports that she does not currently use alcohol. She reports that she does not use drugs.The patient is accompanied by her husband today.  Allergies:  Allergies  Allergen Reactions   Voltaren [Diclofenac Sodium]     Thought it had something to do with her having a stroke   Nsaids     Had a stroke and worries it was related to the Diclofenac she took.    Current Medications: Current Outpatient Medications  Medication Sig Dispense Refill   Calcium Carb-Cholecalciferol (CALCIUM 600 + D PO) Take by mouth. Twice daily     Cholecalciferol (VITAMIN D) 50 MCG (2000 UT) CAPS Take by mouth.     Cyanocobalamin (B-12 PO) Take by mouth. Once a day     famotidine (PEPCID) 40 MG tablet Take 40 mg by mouth daily.     loratadine (CLARITIN) 10 MG tablet Take 10 mg by mouth daily.     Multiple Vitamin (MULTIVITAMIN ADULT PO) Take by mouth. Vision MD once a day     ramipril (ALTACE) 2.5 MG capsule Take by mouth.     traZODone (DESYREL) 100 MG tablet Take 100 mg by mouth at bedtime.     No current facility-administered medications for this visit.     I,[name],acting as a scribe for Dellia Beckwith, MD.,have documented all relevant documentation on the behalf of Dellia Beckwith, MD,as directed by  Dellia Beckwith, MD while in the presence of Dellia Beckwith, MD.

## 2022-10-28 ENCOUNTER — Inpatient Hospital Stay: Payer: BC Managed Care – PPO | Attending: Oncology

## 2022-10-28 DIAGNOSIS — Z8572 Personal history of non-Hodgkin lymphomas: Secondary | ICD-10-CM | POA: Diagnosis not present

## 2022-10-28 DIAGNOSIS — R590 Localized enlarged lymph nodes: Secondary | ICD-10-CM | POA: Diagnosis not present

## 2022-10-28 DIAGNOSIS — Z79899 Other long term (current) drug therapy: Secondary | ICD-10-CM | POA: Insufficient documentation

## 2022-10-28 DIAGNOSIS — M25511 Pain in right shoulder: Secondary | ICD-10-CM | POA: Diagnosis not present

## 2022-10-28 DIAGNOSIS — D519 Vitamin B12 deficiency anemia, unspecified: Secondary | ICD-10-CM

## 2022-10-28 DIAGNOSIS — E538 Deficiency of other specified B group vitamins: Secondary | ICD-10-CM | POA: Insufficient documentation

## 2022-10-28 DIAGNOSIS — C8302 Small cell B-cell lymphoma, intrathoracic lymph nodes: Secondary | ICD-10-CM

## 2022-10-28 LAB — CMP (CANCER CENTER ONLY)
ALT: 29 U/L (ref 0–44)
AST: 25 U/L (ref 15–41)
Albumin: 2.8 g/dL — ABNORMAL LOW (ref 3.5–5.0)
Alkaline Phosphatase: 64 U/L (ref 38–126)
Anion gap: 13 (ref 5–15)
BUN: 15 mg/dL (ref 8–23)
CO2: 24 mmol/L (ref 22–32)
Calcium: 9.1 mg/dL (ref 8.9–10.3)
Chloride: 99 mmol/L (ref 98–111)
Creatinine: 1 mg/dL (ref 0.44–1.00)
GFR, Estimated: >60 mL/min (ref >60)
Glucose, Bld: 118 mg/dL — ABNORMAL HIGH (ref 70–99)
Potassium: 3.9 mmol/L (ref 3.5–5.1)
Sodium: 136 mmol/L (ref 135–145)
Total Bilirubin: 0.2 mg/dL — ABNORMAL LOW (ref 0.3–1.2)
Total Protein: 7.8 g/dL (ref 6.5–8.1)

## 2022-10-28 LAB — CBC WITH DIFFERENTIAL (CANCER CENTER ONLY)
Abs Immature Granulocytes: 0.07 10*3/uL (ref 0.00–0.07)
Basophils Absolute: 0 10*3/uL (ref 0.0–0.1)
Basophils Relative: 1 %
Eosinophils Absolute: 0.1 10*3/uL (ref 0.0–0.5)
Eosinophils Relative: 2 %
HCT: 35 % — ABNORMAL LOW (ref 36.0–46.0)
Hemoglobin: 11.1 g/dL — ABNORMAL LOW (ref 12.0–15.0)
Immature Granulocytes: 1 %
Lymphocytes Relative: 31 %
Lymphs Abs: 2 10*3/uL (ref 0.7–4.0)
MCH: 32.2 pg (ref 26.0–34.0)
MCHC: 31.7 g/dL (ref 30.0–36.0)
MCV: 101.4 fL — ABNORMAL HIGH (ref 80.0–100.0)
Monocytes Absolute: 0.2 10*3/uL (ref 0.1–1.0)
Monocytes Relative: 2 %
Neutro Abs: 3.9 10*3/uL (ref 1.7–7.7)
Neutrophils Relative %: 63 %
Platelet Count: 317 10*3/uL (ref 150–400)
RBC: 3.45 MIL/uL — ABNORMAL LOW (ref 3.87–5.11)
RDW: 14.7 % (ref 11.5–15.5)
WBC Count: 6.3 10*3/uL (ref 4.0–10.5)
nRBC: 0 % (ref 0.0–0.2)

## 2022-10-28 LAB — LIPID PANEL
Cholesterol: 158 mg/dL (ref 0–200)
HDL: 28 mg/dL — ABNORMAL LOW (ref >40)
LDL Cholesterol: 107 mg/dL — ABNORMAL HIGH (ref 0–99)
Total CHOL/HDL Ratio: 5.6 RATIO
Triglycerides: 113 mg/dL (ref <150)
VLDL: 23 mg/dL (ref 0–40)

## 2022-10-28 LAB — VITAMIN B12: Vitamin B-12: 1179 pg/mL — ABNORMAL HIGH (ref 180–914)

## 2022-10-28 LAB — TSH: TSH: 1.084 u[IU]/mL (ref 0.350–4.500)

## 2022-10-29 ENCOUNTER — Other Ambulatory Visit: Payer: Self-pay | Admitting: Oncology

## 2022-10-29 ENCOUNTER — Encounter: Payer: Self-pay | Admitting: Oncology

## 2022-10-29 ENCOUNTER — Inpatient Hospital Stay: Payer: BC Managed Care – PPO | Admitting: Oncology

## 2022-10-29 ENCOUNTER — Telehealth: Payer: Self-pay | Admitting: Hematology and Oncology

## 2022-10-29 VITALS — BP 146/98 | HR 80 | Temp 98.4°F | Resp 16 | Ht 60.0 in | Wt 113.4 lb

## 2022-10-29 DIAGNOSIS — R590 Localized enlarged lymph nodes: Secondary | ICD-10-CM | POA: Diagnosis not present

## 2022-10-29 DIAGNOSIS — C8590 Non-Hodgkin lymphoma, unspecified, unspecified site: Secondary | ICD-10-CM | POA: Insufficient documentation

## 2022-10-29 DIAGNOSIS — E538 Deficiency of other specified B group vitamins: Secondary | ICD-10-CM | POA: Diagnosis not present

## 2022-10-29 DIAGNOSIS — M25511 Pain in right shoulder: Secondary | ICD-10-CM

## 2022-10-29 DIAGNOSIS — Z79899 Other long term (current) drug therapy: Secondary | ICD-10-CM

## 2022-10-29 DIAGNOSIS — Z8572 Personal history of non-Hodgkin lymphomas: Secondary | ICD-10-CM | POA: Diagnosis not present

## 2022-10-29 DIAGNOSIS — C8302 Small cell B-cell lymphoma, intrathoracic lymph nodes: Secondary | ICD-10-CM

## 2022-10-29 DIAGNOSIS — C9110 Chronic lymphocytic leukemia of B-cell type not having achieved remission: Secondary | ICD-10-CM

## 2022-10-29 DIAGNOSIS — D519 Vitamin B12 deficiency anemia, unspecified: Secondary | ICD-10-CM

## 2022-10-29 LAB — HEMOGLOBIN A1C
Hgb A1c MFr Bld: 6.1 % — ABNORMAL HIGH (ref 4.8–5.6)
Mean Plasma Glucose: 128 mg/dL

## 2022-10-29 NOTE — Progress Notes (Signed)
[facility]  [address] 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  She has been on observation only. She has had stable bilateral cervical and inguinal lymphadenopathy.  MRI imaging from February 2023 revealed extensive inguinal and retroperitoneal lymphadenopathy.  Her physical exam reveals a modest change in her cervical and supraclavicular adenopathy. PET scan in March revealed continued stability of mild adenopathy in the neck, chest, abdomen and pelvis with low level FDG and Deauville 2-3 category uptake. The latest CT in September of 2023 shows the numerous mildly enlarged nodes to be unchanged. Her lymphadenopathy is relatively stable at this time but mildly increased.  Pain of right shoulder An ultrasound of the right axilla was done and reveals stable to slightly larger axillary lymph nodes measuring 3.3 cm and 3.0 cm. I do not feel this is the source of her pain as they have been present for years. She has been evaluated and had physical therapy and Cortisone injection.   B12 deficiency anemia She was treated with B12 injections then transitioned to oral B12.  She is no longer taking B12 as her B12 level has been elevated at the [facility] office.  On May 11, B12 was greater than 2000, iron studies and folate were normal.  She remains off B12 as her last level in June was over 1,000. Now it has dropped to 350 and so I had advised her to get back on oral B-12 and we will recheck her level later.  Pneumonia Left upper lobe, lobar pneumonia in May 2024, has been treated with multiple antibiotics and is clinically improving. We will get a CT scan to re-assess.   Plan Augmentin was prescribed first, and she is finishing a  course of Zithromax, and a steroid shot before having a sinus and chest X-rays. Her chest X-ray on 10/24/2022 revealed lobar pneumonia of the left upper lobe and her sinus X-ray is pending. She has now been placed on Omnicef for the last few days. Patient states that she is feeling better with an occasional cough but has no complaints of pain. Her WBC is 6.3, hemoglobin 11.1, and platelet count 317,000 as of 10/28/2022. Her CMP is normal. Her vitamin B-12 was elevated at 1,179, but later dropped to 350. Her folate, iron, TIBC, and Ferritin are pending. She is about due for restaging scans anyway and I want to follow up on the area of consolidation. I will see her back in 2 weeks with CBC, CMP on the 21st of June and CT chest, abdomen, and pelvis before her appointment with me.  The patient and her husband understand the plans discussed today and are in agreement with them.  She knows to contact our office if she develops concerns prior to her next appointment.   I provided 17 minutes of face-to-face time during this encounter and > 50% was spent counseling as documented under my assessment and plan.    I,Dawn Prince,acting as a scribe for Dellia Beckwith, MD.,have documented all relevant documentation on the behalf of Dellia Beckwith, MD,as directed by  Dellia Beckwith, MD while in the presence of Dellia Beckwith, MD.   Dellia Beckwith, MD  [facility] [address]  [place] [address] Dept: [phone] Dept Fax: [phone]   No orders of the defined types were placed in this encounter.   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  Staging CT chest revealed adenopathy of the bilateral neck measuring up to 18  mm in diameter with left subclavian, bilateral axillary and subpectoral adenopathy, but no mediastinal nodes.  CT abdomen was  negative.  CT pelvis revealed bilateral external iliac nodes up to 11 mm in diameter.  She had B symptoms with severe night sweats and weight loss.  She has been on  observation only.  Due to her family history of breast cancer, she underwent testing for hereditary breast and ovarian cancer with the My[employer] myRisk Hereditary Cancer Gene panel test.  This did not reveal any clinically significant mutation.  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  Her [facility] breast cancer risk assessment showed her lifetime risk of breast cancer to be 29.7%, so annual breast MRI, in addition to mammogram is recommended.  Mammogram and MRI breast done in August 2019 did not reveal any evidence of malignancy.  She did go for a second opinion to [facility] regarding her lymphoma and they concurred with the approach of watchful waiting.    She presented to the emergency room in mid April 2021 due to increased urinary frequency and discomfort.  CT imaging revealed left sided obstructive uropathy with 3 mm calculus in the distal left ureter just proximal to the ureterovesical unction causing moderate hydroureteronephrosis and perinephric stranding.  Left greater than right iliac and pelvic lymphadenopathy, slightly greater than on 10/26/18, consistent with known history of lymphoma.  We did not repeat a scan in June as scheduled.  Annual screening bilateral mammogram from August 2021 was clear.  MRI breast has not been repeated due to her comorbidities.   CT neck in May 2022 revealed multiple lymph nodes in the neck bilaterally, with mild progression of lymph nodes on the right. There has been more significant progression of left level 4 and left supraclavicular lymph nodes compared to the prior study. Largest lymph node in the left supraclavicular region measures 35 x 17  mm. Findings were compatible with lymphoma. CT chest/abdomen/pelvis revealed no substantial interval change in exam. Bilateral supraclavicular, subpectoral, axillary, retroperitoneal, and pelvic lymphadenopathy was similar to prior. There were no definite findings of progression.  At her visit in September, the lymphoma remained stable. She continued to have sciatic pain unrelieved with gabapentin 800 mg TID. She had steroid injections of the spine with improvement.    Bilateral screening mammogram in January revealed a possible asymmetry in the left breast.  She underwent left diagnostic mammogram and the asymmetry disperses with compression, so it is felt to be dense fibroglandular tissue.  There is no evidence of malignancy.  Bilateral screening in 1 year was recommended.  MRI of the right hip from February 2023 revealed extensive inguinal and retroperitoneal lymphadenopathy. There are high-grade tears of the right gluteus numbness greater than medius with background tendinosis and trochanteric bursitis, and bilateral hamstring origin tendinosis with mild partial tearing. She has mild bilateral hip osteoarthritis, and  right sided labral tear. She states that she has chronic palpable lymph nodes of the left neck and supraclavicular area.  She therefore underwent PET scan in March,  which revealed stable disease when compared to previous CT images.   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)  11/11/2015 Cancer Staging   Staging form: Hodgkin and Non-Hodgkin Lymphoma, AJCC 8th Edition - Clinical stage from 11/11/2015: Stage III (Small lymphocytic leukemia) - Signed by Dellia Beckwith, MD on 01/28/2021 Histopathologic type: Malignant lymphoma, small B lymphocytic, NOS (see also M-9823/3) Stage prefix: Initial diagnosis Diagnostic confirmation: Positive histology PLUS positive immunophenotyping and/or positive genetic studies Specimen type: Core Needle Biopsy Staged  by: Managing physician Stage used in treatment planning: Yes National guidelines used in treatment planning: Yes Type of national guideline used in treatment planning: NCCN Staging comments: Watchful waiting   06/20/2020 Initial Diagnosis   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)   Chronic lymphocytic leukemia (CLL), B-cell (HCC)  10/31/2015 Initial Diagnosis   Chronic lymphocytic leukemia (CLL), B-cell (HCC)   11/11/2015 Cancer Staging   Staging form: Chronic Lymphocytic Leukemia / Small Lymphocytic Lymphoma, AJCC 8th Edition - Clinical stage from 11/11/2015: Modified Rai Stage III (Modified Rai risk: High, Binet: Stage B, Lugano: Stage III, Lymphocytosis: Absent, Adenopathy: Present, Organomegaly: Absent, Anemia: Absent, Thrombocytopenia: Absent) - Signed by Dellia Beckwith, MD on 04/05/2021 Histopathologic type: B-cell lymphocytic leukemia/small lymphocytic lymphoma (see also M-9670/3) Stage prefix: Initial diagnosis Stage used in treatment planning: Yes National guidelines used in treatment planning: Yes Type of national guideline used in treatment planning: NCCN       INTERVAL HISTORY:  Dawn Prince is here today for repeat clinical assessment for small cell B cell lymphoma. Patient informed me that she contracted a cold 3 weeks ago and wen to urgent care after 12 days of no improvement. She was prescribed Augmentin and now she is finishing a course of Zithromax, and had a steroid shot before having sinus and chest X-rays. Her chest X-ray on 10/24/2022 revealed lobar pneumonia of the left upper lobe and her sinus X-ray is pending. She has now been placed on Omnicef for the last few days. Patient states that she is feeling better with an occasional cough but has no complaints of pain. Her WBC is 6.3, hemoglobin 11.1, and platelet count 317,000 as of 10/28/2022. Her CMP is normal. Her vitamin B-12 was elevated at 1,179, but came down to 350 last time. Her folate, iron,  TIBC, and Ferritin are pending. She is about due for restaging scans anyway and I want to follow up on the area of consolidation. I will see her back in 2 weeks with CBC, CMP on the 21st of June and CT chest, abdomen, and pelvis before her appointment with me.She denies signs of infection such as sore throat, sinus drainage, cough, or urinary symptoms.  She denies fevers or recurrent chills. She denies pain. She denies nausea, vomiting, chest pain, dyspnea or cough. Her appetite is not the best and her weight has decreased 12 pounds over last 3 months . She is accompanied at today's visit with her husband.   REVIEW OF SYSTEMS:  Review of Systems  Constitutional:  Positive for fatigue. Negative for appetite change, chills, diaphoresis, fever and unexpected weight change.  HENT:  Negative.  Negative for hearing loss, lump/mass, mouth sores, nosebleeds, sore throat, tinnitus, trouble swallowing and voice change.   Eyes:  Negative for icterus.  Respiratory:  Positive for cough. Negative for chest tightness, hemoptysis, shortness of breath and wheezing.   Cardiovascular:  Negative for chest pain, leg swelling and palpitations.  Gastrointestinal: Negative.  Negative for  abdominal distention, abdominal pain, blood in stool, constipation, diarrhea, nausea, rectal pain and vomiting.  Endocrine: Negative.   Genitourinary:  Negative for bladder incontinence, difficulty urinating, dyspareunia, dysuria, frequency, hematuria, menstrual problem, nocturia, pelvic pain, vaginal bleeding and vaginal discharge.   Musculoskeletal:  Positive for arthralgias (right shoulder improved but still hurts). Negative for back pain, flank pain, gait problem, myalgias, neck pain and neck stiffness.       Hip pain  Skin: Negative.  Negative for itching, rash and wound.  Neurological:  Positive for headaches. Negative for dizziness, extremity weakness, gait problem, light-headedness, numbness, seizures and speech difficulty.   Hematological: Negative.  Negative for adenopathy. Does not bruise/bleed easily.  Psychiatric/Behavioral:  Positive for sleep disturbance. Negative for confusion, decreased concentration, depression and suicidal ideas. The patient is not nervous/anxious.     VITALS:  Blood pressure (!) 146/98, pulse 80, temperature 98.4 F (36.9 C), temperature source Oral, resp. rate 16, height 5' (1.524 m), weight 113 lb 6.4 oz (51.4 kg), SpO2 96 %.  Wt Readings from Last 3 Encounters:  10/29/22 113 lb 6.4 oz (51.4 kg)  07/31/22 125 lb 8 oz (56.9 kg)  07/14/22 122 lb (55.3 kg)    Body mass index is 22.15 kg/m.  Performance status (ECOG): 2 - Symptomatic, <50% confined to bed  PHYSICAL EXAM:  Physical Exam Vitals and nursing note reviewed. Exam conducted with a chaperone present.  Constitutional:      General: She is not in acute distress.    Appearance: Normal appearance. She is normal weight. She is not ill-appearing, toxic-appearing or diaphoretic.  HENT:     Head: Normocephalic and atraumatic.     Right Ear: Tympanic membrane, ear canal and external ear normal. There is no impacted cerumen.     Left Ear: Tympanic membrane, ear canal and external ear normal. There is no impacted cerumen.     Nose: Nose normal. No congestion or rhinorrhea.     Mouth/Throat:     Mouth: Mucous membranes are moist.     Pharynx: Oropharynx is clear. No oropharyngeal exudate or posterior oropharyngeal erythema.  Eyes:     General: No scleral icterus.       Right eye: No discharge.        Left eye: No discharge.     Extraocular Movements: Extraocular movements intact.     Conjunctiva/sclera: Conjunctivae normal.     Pupils: Pupils are equal, round, and reactive to light.  Neck:     Vascular: No carotid bruit.     Comments:   Cardiovascular:     Rate and Rhythm: Normal rate and regular rhythm.     Pulses: Normal pulses.     Heart sounds: Normal heart sounds. No murmur heard.    No friction rub. No gallop.   Pulmonary:     Effort: Pulmonary effort is normal. No respiratory distress.     Breath sounds: Normal breath sounds. No stridor. No wheezing, rhonchi or rales.  Chest:     Chest wall: No tenderness.  Abdominal:     General: Bowel sounds are normal. There is no distension.     Palpations: Abdomen is soft. There is no hepatomegaly, splenomegaly or mass.     Tenderness: There is no abdominal tenderness. There is no right CVA tenderness, left CVA tenderness, guarding or rebound.     Hernia: No hernia is present.  Musculoskeletal:        General: No swelling, tenderness, deformity or signs of injury. Normal range of  motion.     Cervical back: Normal range of motion and neck supple. No rigidity or tenderness.     Right lower leg: No edema.     Left lower leg: No edema.  Lymphadenopathy:     Cervical: Cervical adenopathy present.     Right cervical: Superficial cervical adenopathy and posterior cervical adenopathy present.     Left cervical: Posterior cervical adenopathy present.     Upper Body:     Right upper body: Supraclavicular adenopathy and axillary adenopathy present. No pectoral adenopathy.     Left upper body: Axillary adenopathy present. No supraclavicular or pectoral adenopathy.     Lower Body: Right inguinal adenopathy present. Left inguinal adenopathy present.     Comments: A soft node in the left posterior cervical area which is 1.5cm and another 1cm below that. She has a 1cm node in the right posterior cervical area. Small firm nodes in the left supraclavicular area 1-2cm. Fullness in the right supraclavicular area.  Tiny node deep in the left axilla and a larger node about 3cm in the medial axilla.  Firm 1-2cm node in the right axilla and 2 additional nodes superior to that measuring 1cm or less.  2cm node in the left inguinal area and a small 1cm node in the right inguinal area  Skin:    General: Skin is warm and dry.     Coloration: Skin is not jaundiced or pale.      Findings: No bruising, erythema, lesion or rash.  Neurological:     General: No focal deficit present.     Mental Status: She is alert and oriented to person, place, and time. Mental status is at baseline.     Cranial Nerves: No cranial nerve deficit.     Sensory: No sensory deficit.     Motor: No weakness.     Coordination: Coordination normal.     Gait: Gait normal.     Deep Tendon Reflexes: Reflexes normal.  Psychiatric:        Mood and Affect: Mood normal.        Behavior: Behavior normal.        Thought Content: Thought content normal.        Judgment: Judgment normal.     LABS:      Latest Ref Rng & Units 10/28/2022    9:50 AM 07/30/2022    9:09 AM 07/14/2022   11:26 PM  CBC  WBC 4.0 - 10.5 K/uL 6.3  4.6  5.6   Hemoglobin 12.0 - 15.0 g/dL 16.1  09.6  04.5   Hematocrit 36.0 - 46.0 % 35.0  36.7  36.5   Platelets 150 - 400 K/uL 317  185  194       Latest Ref Rng & Units 10/28/2022    9:50 AM 07/30/2022    9:09 AM 07/14/2022   11:26 PM  CMP  Glucose 70 - 99 mg/dL 409  811  914   BUN 8 - 23 mg/dL 15  14  18    Creatinine 0.44 - 1.00 mg/dL 7.82  9.56  2.13   Sodium 135 - 145 mmol/L 136  141  139   Potassium 3.5 - 5.1 mmol/L 3.9  3.8  4.3   Chloride 98 - 111 mmol/L 99  106  103   CO2 22 - 32 mmol/L 24  27  26    Calcium 8.9 - 10.3 mg/dL 9.1  8.7  9.3   Total Protein 6.5 - 8.1 g/dL 7.8  7.2  6.7   Total Bilirubin 0.3 - 1.2 mg/dL 0.2  0.1  0.2   Alkaline Phos 38 - 126 U/L 64  40  47   AST 15 - 41 U/L 25  21  20    ALT 0 - 44 U/L 29  14  15     Component Ref Range & Units 10/28/2022 1 yr ago  Hgb A1c MFr Bld 4.8 - 5.6 % 6.1 High  6.1 High  CM  Mean Plasma Glucose mg/dL 161 096.04 CM   Component Ref Range & Units 10/28/2022 1 yr ago  Cholesterol 0 - 200 mg/dL 540 981  Triglycerides <150 mg/dL 191 72  HDL >47 mg/dL 28 Low  53  Total CHOL/HDL Ratio RATIO 5.6 2.7  VLDL 0 - 40 mg/dL 23 14  LDL Cholesterol 0 - 99 mg/dL 829 High  78 CM   Component Ref Range & Units  10/28/2022 3 mo ago 12 mo ago 1 yr ago  Vitamin B-12 180 - 914 pg/mL 1,179 High  350 CM 1,018 High  CM 199 CM     Component Ref Range & Units 10/28/2022  TSH 0.350 - 4.500 uIU/mL 1.084   No results found for: "CEA1", "CEA" / No results found for: "CEA1", "CEA" No results found for: "PSA1" No results found for: "FAO130" No results found for: "CAN125"  No results found for: "TOTALPROTELP", "ALBUMINELP", "A1GS", "A2GS", "BETS", "BETA2SER", "GAMS", "MSPIKE", "SPEI" Lab Results  Component Value Date   TIBC 331 07/23/2021   FERRITIN 22 07/23/2021   IRONPCTSAT 20 07/23/2021   Lab Results  Component Value Date   LDH 136 10/30/2021   LDH 142 07/20/2021   LDH 147 04/26/2021    STUDIES:   EXAM: 03/08/2022 ULTRASOUND OF THE right AXILLA 03/14/22 IMPRESSION: Enlarged right axillary lymph nodes consistent with known lymphoma.        HISTORY:   Past Medical History:  Diagnosis Date   Allergy    seasonal   Arthritis    Bipolar 1 disorder (HCC)    Cancer (HCC)    non hodgkins lymphoma   Depression    GERD (gastroesophageal reflux disease)    History of degenerative disc disease    Hyperlipidemia    Increased risk of breast cancer 04/25/2021   Obstructive sleep apnea    Osteopenia    Scoliosis    Thyroid disease    hypothyroidism   Vitamin D deficiency     Past Surgical History:  Procedure Laterality Date   BREAST BIOPSY     BUBBLE STUDY  02/13/2021   Procedure: BUBBLE STUDY;  Surgeon: Chilton Si, MD;  Location: [facility] ENDOSCOPY;  Service: Cardiovascular;;   CARPAL TUNNEL RELEASE Bilateral 2002   CESAREAN SECTION     x2   COLONOSCOPY  07/26/2008   Melanosis coli. Small internal hemorrhoids.    ENDOSCOPIC PLANTAR FASCIOTOMY     ESOPHAGOGASTRODUODENOSCOPY  03/17/2013   Mild gastritis. Status post esophageal dilatation.   LYMPH NODE BIOPSY     right hip repair torn tendon Right 09/2021   TEE WITHOUT CARDIOVERSION N/A 02/13/2021   Procedure: TRANSESOPHAGEAL  ECHOCARDIOGRAM (TEE);  Surgeon: Chilton Si, MD;  Location: [facility] ENDOSCOPY;  Service: Cardiovascular;  Laterality: N/A;   WISDOM TOOTH EXTRACTION      Family History  Problem Relation Age of Onset   Prostate cancer Father 57   Melanoma Father 48   High blood pressure Father    Breast cancer Maternal Aunt    Multiple myeloma Maternal Aunt  Breast cancer Maternal Aunt    Colon cancer Neg Hx    Colon polyps Neg Hx    Esophageal cancer Neg Hx    Rectal cancer Neg Hx    Stomach cancer Neg Hx     Social History:  reports that she has never smoked. She has never used smokeless tobacco. She reports that she does not currently use alcohol. She reports that she does not use drugs.The patient is accompanied by her husband today.  Allergies:  Allergies  Allergen Reactions   Diclofenac Sodium     Thought it had something to do with her having a stroke   Voltaren [Diclofenac Sodium]     Thought it had something to do with her having a stroke   Nsaids     Had a stroke and worries it was related to the Diclofenac she took.    Current Medications: Current Outpatient Medications  Medication Sig Dispense Refill   cefdinir (OMNICEF) 300 MG capsule      promethazine-dextromethorphan (PROMETHAZINE-DM) 6.25-15 MG/5ML syrup Take by mouth.     Calcium Carb-Cholecalciferol (CALCIUM 600 + D PO) Take by mouth. Twice daily     Cholecalciferol (VITAMIN D) 50 MCG (2000 UT) CAPS Take by mouth.     Cyanocobalamin (B-12 PO) Take by mouth. Once a day     famotidine (PEPCID) 40 MG tablet Take 40 mg by mouth daily.     Multiple Vitamin (MULTIVITAMIN ADULT PO) Take by mouth. Vision MD once a day     ramipril (ALTACE) 2.5 MG capsule Take by mouth.     No current facility-administered medications for this visit.     I,Dawn Prince,acting as a scribe for Dellia Beckwith, MD.,have documented all relevant documentation on the behalf of Dellia Beckwith, MD,as directed by  Dellia Beckwith,  MD while in the presence of Dellia Beckwith, MD.

## 2022-10-29 NOTE — Telephone Encounter (Signed)
10/29/22 lvm upcoming CT SCAN on 11/06/22@930am 

## 2022-10-30 ENCOUNTER — Other Ambulatory Visit: Payer: BC Managed Care – PPO

## 2022-10-30 LAB — T4: T4, Total: 7.4 ug/dL (ref 4.5–12.0)

## 2022-10-31 ENCOUNTER — Ambulatory Visit: Payer: BC Managed Care – PPO | Admitting: Oncology

## 2022-11-04 ENCOUNTER — Telehealth: Payer: Self-pay

## 2022-11-04 NOTE — Telephone Encounter (Signed)
Called patient to let her know that her XRAY if sinuses showed sinusitis.

## 2022-11-06 DIAGNOSIS — C8302 Small cell B-cell lymphoma, intrathoracic lymph nodes: Secondary | ICD-10-CM | POA: Diagnosis not present

## 2022-11-06 DIAGNOSIS — C8300 Small cell B-cell lymphoma, unspecified site: Secondary | ICD-10-CM | POA: Diagnosis not present

## 2022-11-06 DIAGNOSIS — R918 Other nonspecific abnormal finding of lung field: Secondary | ICD-10-CM | POA: Diagnosis not present

## 2022-11-08 ENCOUNTER — Other Ambulatory Visit: Payer: Self-pay | Admitting: Oncology

## 2022-11-08 ENCOUNTER — Encounter: Payer: Self-pay | Admitting: Oncology

## 2022-11-08 ENCOUNTER — Telehealth: Payer: Self-pay

## 2022-11-08 ENCOUNTER — Inpatient Hospital Stay (INDEPENDENT_AMBULATORY_CARE_PROVIDER_SITE_OTHER): Payer: BC Managed Care – PPO | Admitting: Oncology

## 2022-11-08 VITALS — BP 157/97 | HR 96 | Temp 98.0°F | Resp 16 | Ht 60.0 in | Wt 110.1 lb

## 2022-11-08 DIAGNOSIS — E538 Deficiency of other specified B group vitamins: Secondary | ICD-10-CM | POA: Diagnosis not present

## 2022-11-08 DIAGNOSIS — C8302 Small cell B-cell lymphoma, intrathoracic lymph nodes: Secondary | ICD-10-CM | POA: Diagnosis not present

## 2022-11-08 DIAGNOSIS — Z79899 Other long term (current) drug therapy: Secondary | ICD-10-CM | POA: Diagnosis not present

## 2022-11-08 DIAGNOSIS — C9110 Chronic lymphocytic leukemia of B-cell type not having achieved remission: Secondary | ICD-10-CM | POA: Diagnosis not present

## 2022-11-08 DIAGNOSIS — M25511 Pain in right shoulder: Secondary | ICD-10-CM | POA: Diagnosis not present

## 2022-11-08 DIAGNOSIS — D519 Vitamin B12 deficiency anemia, unspecified: Secondary | ICD-10-CM

## 2022-11-08 DIAGNOSIS — R590 Localized enlarged lymph nodes: Secondary | ICD-10-CM | POA: Diagnosis not present

## 2022-11-08 DIAGNOSIS — Z8572 Personal history of non-Hodgkin lymphomas: Secondary | ICD-10-CM | POA: Diagnosis not present

## 2022-11-08 DIAGNOSIS — A084 Viral intestinal infection, unspecified: Secondary | ICD-10-CM

## 2022-11-08 MED ORDER — SODIUM CHLORIDE 0.9 % IV SOLN: Freq: Once | INTRAVENOUS | Status: AC

## 2022-11-08 MED ORDER — ONDANSETRON HCL 4 MG PO TABS: 4.0000 mg | ORAL_TABLET | Freq: Three times a day (TID) | ORAL | 1 refills | Status: DC | PRN

## 2022-11-08 NOTE — Progress Notes (Signed)
[facility]  [address] 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  She has been on observation only. She has had stable bilateral cervical and inguinal lymphadenopathy.  MRI imaging from February 2023 revealed extensive inguinal and retroperitoneal lymphadenopathy.  Her physical exam reveals a modest change in her cervical and supraclavicular adenopathy. PET scan in March revealed continued stability of mild adenopathy in the neck, chest, abdomen and pelvis with low level FDG and Deauville 2-3 category uptake. The latest CT in September of 2023 shows the numerous mildly enlarged nodes to be unchanged. Her lymphadenopathy is relatively stable at this time but just mildly increased.  Pain of right shoulder An ultrasound of the right axilla was done and reveals stable to slightly larger axillary lymph nodes measuring 3.3 cm and 3.0 cm. I do not feel this is the source of her pain as they have been present for years. She has been evaluated and had physical therapy and Cortisone injection.   B12 deficiency anemia She was treated with B12 injections then transitioned to oral B12.  She is no longer taking B12 as her B12 level has been elevated at the [facility].  On May 11, B12 was greater than 2000, iron studies and folate were normal.  She remains off B12 as her last level in June was over 1,000. Now it has dropped to 350 and so I had advised her to get back on oral B-12 and we will recheck her level later.  Pneumonia Left upper lobe, lobar pneumonia in May 2024, has been treated with multiple antibiotics and is clinically improving. Ct scan shows resolution with a band of linear consolidation in the l;eft upper lobe.  Acute  Diarrhea This is likely from multiple antibiotics and is likely suspicious for C.dificile. Hopefully she can get [place] a stool specimen so that we can have answers today. We discussed dietary interventions and the need for rehydration. I will give her IV fluids here today   Plan Patient states that she feels poorly and complains of nausea while travelling, diarrhea which was orange in color, fatigue, vomiting, and change of smell. She hasn't been able to eat much. I recommend yogurt to help her bowels. I think she might have some form of colitis. She has recently been treated with multiple antibiotics for pneumonia and sinusitis including 5 days of Augmentin, 5 days of Zithromax, and 10 days of Omnicef.  She finished her last dose antibiotics on Sunday. I ordered a stool culture and I am suspicious for C dif. I will prescribe Zofran for nausea. I will also put in an order for IV fluids [phone] cc of normal saline today. She had pneumonia previously and it was recommended that she have a CT scan of the chest for follow-up.  She was due anyway for restaging scans and so those were scheduled for this week.  Her CT chest, abdomen and pelvis done on 11/06/22 revealed stable bilateral axillary and supraclavicular lymphadenopathy, no mediastinal lymphadenopathy, a new band of linear consolidation in the LEFT upper lobe which is probably post infectious consolidation. Lymphoma is not excluded but not favored.  She has stable retroperitoneal and proximal iliac lymphadenopathy, and no new adenopathy in the abdomen and pelvis.  She has a normal spleen  and no skeletal metastasis.  Examination reveals just a slight increase in size in a few of her lymph nodes.  Her husband asked about her cancer progression and I informed them that the lymphoma is stable but has weakened her immune system. I will see her back in 1 month with CBC and CMP and if she need more IV fluids, she can give [place] a call. The patient and her husband  understand the plans discussed today and are in agreement with them.  She knows to contact our office if she develops concerns prior to her next appointment.   I provided 30 minutes of face-to-face time during this encounter and > 50% was spent counseling as documented under my assessment and plan.    ADDENDUM: Her stool studies are negative for C. difficile or any other pathogen.  Her diarrhea is more likely just related to the multiple recent antibiotics for her pneumonia.    Dellia Beckwith, MD  [facility] [address] [place] [address] Dept: [phone] Dept Fax: [phone]   No orders of the defined types were placed in this encounter.   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  Staging CT chest revealed adenopathy of the bilateral neck measuring up to 18 mm in diameter with left subclavian, bilateral axillary and subpectoral adenopathy, but no mediastinal nodes.  CT abdomen was  negative.  CT pelvis revealed bilateral external iliac nodes up to 11 mm in diameter.  She had B symptoms with severe night sweats and weight loss.  She has been on  observation only.  Due to her family history of breast cancer, she underwent testing for hereditary breast and ovarian cancer with the [employer] myRisk Hereditary Cancer Gene panel test.  This did not reveal any clinically significant mutation.  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  Her [facility] breast cancer risk assessment showed her lifetime risk of breast cancer to be 29.7%, so annual breast MRI, in addition to mammogram is recommended.  Mammogram and MRI breast done in August 2019 did not reveal any evidence of malignancy.  She did go for a  second opinion to [facility] regarding her lymphoma and they concurred with the approach of watchful waiting.    She presented to the emergency room in mid April 2021 due to increased urinary frequency and discomfort.  CT imaging revealed left sided obstructive uropathy with 3 mm calculus in the distal left ureter just proximal to the ureterovesical unction causing moderate hydroureteronephrosis and perinephric stranding.  Left greater than right iliac and pelvic lymphadenopathy, slightly greater than on 10/26/18, consistent with known history of lymphoma.  We did not repeat a scan in June as scheduled.  Annual screening bilateral mammogram from August 2021 was clear.  MRI breast has not been repeated due to her comorbidities.   CT neck in May 2022 revealed multiple lymph nodes in the neck bilaterally, with mild progression of lymph nodes on the right. There has been more significant progression of left level 4 and left supraclavicular lymph nodes compared to the prior study. Largest lymph node in the left supraclavicular region measures 35 x 17 mm. Findings were compatible with lymphoma. CT chest/abdomen/pelvis revealed no substantial interval change in exam. Bilateral supraclavicular,  subpectoral, axillary, retroperitoneal, and pelvic lymphadenopathy was similar to prior. There were no definite findings of progression.  At her visit in September, the lymphoma remained stable. She continued to have sciatic pain unrelieved with gabapentin 800 mg TID. She had steroid injections of the spine with improvement.    Bilateral screening mammogram in January revealed a possible asymmetry in the left breast.  She underwent left diagnostic mammogram and the asymmetry disperses with compression, so it is felt to be dense fibroglandular tissue.  There is no evidence of malignancy.  Bilateral screening in 1 year was recommended.  MRI of the right hip from February 2023 revealed extensive inguinal and retroperitoneal  lymphadenopathy. There are high-grade tears of the right gluteus numbness greater than medius with background tendinosis and trochanteric bursitis, and bilateral hamstring origin tendinosis with mild partial tearing. She has mild bilateral hip osteoarthritis, and  right sided labral tear. She states that she has chronic palpable lymph nodes of the left neck and supraclavicular area.  She therefore underwent PET scan in March, which revealed stable disease when compared to previous CT images.   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)  11/11/2015 Cancer Staging   Staging form: Hodgkin and Non-Hodgkin Lymphoma, AJCC 8th Edition - Clinical stage from 11/11/2015: Stage III (Small lymphocytic leukemia) - Signed by Dellia Beckwith, MD on 01/28/2021 Histopathologic type: Malignant lymphoma, small B lymphocytic, NOS (see also M-9823/3) Stage prefix: Initial diagnosis Diagnostic confirmation: Positive histology PLUS positive immunophenotyping and/or positive genetic studies Specimen type: Core Needle Biopsy Staged by: Managing physician Stage used in treatment planning: Yes National guidelines used in treatment planning: Yes Type of national guideline used in treatment planning: NCCN Staging comments: Watchful waiting   06/20/2020 Initial Diagnosis   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)   Chronic lymphocytic leukemia (CLL), B-cell (HCC)  10/31/2015 Initial Diagnosis   Chronic lymphocytic leukemia (CLL), B-cell (HCC)   11/11/2015 Cancer Staging   Staging form: Chronic Lymphocytic Leukemia / Small Lymphocytic Lymphoma, AJCC 8th Edition - Clinical stage from 11/11/2015: Modified Rai Stage III (Modified Rai risk: High, Binet: Stage B, Lugano: Stage III, Lymphocytosis: Absent, Adenopathy: Present, Organomegaly: Absent, Anemia: Absent, Thrombocytopenia: Absent) - Signed by Dellia Beckwith, MD on 04/05/2021 Histopathologic type: B-cell  lymphocytic leukemia/small lymphocytic lymphoma (see also M-9670/3) Stage prefix: Initial diagnosis Stage used in treatment planning: Yes National guidelines used in treatment planning: Yes Type of national guideline used in treatment planning: NCCN       INTERVAL HISTORY:  Tychelle was added on today for severe diarrhea, nausea and vomiting. She has small cell B cell lymphoma but has never required treatment. Patient states that she feels poorly, and complains of nausea while travelling, diarrhea which was orange in color, fatigue, vomiting, and change of smell. She hasn't been able to eat much. I recommend yogurt to help her bowel. I think she might have some form of colitis. She has recently been treated with multiple antibiotics for pneumonia and sinusitis including 5 days of Augmentin, 5 days of Zithromax, and 10 days of Omnicef.  She finished her last dose antibiotics on Sunday. I ordered a stool culture and will call her on those results. I will prescribe Zofran for nausea. I will also put in an order for IV fluids [phone] cc of normal saline today. She had pneumonia previously and it was recommended that she have a CT scan of her chest.  She is due for restaging scans  anyway and so those were scheduled. Her CT chest, abdomen and pelvis done on 11/06/22 revealed stable bilateral axillary and supraclavicular adenopathy, no mediastinal lymphadenopathy, a new band of linear consolidation in the LEFT upper lobe, which is probably post infectious consolidation. Lymphoma is not excluded but not favored.  She has stable retroperitoneal and proximal iliac lymphadenopathy, and no new adenopathy in the abdomen and pelvis.  There is a normal spleen and no skeletal metastasis.  She has just slight enlargement of a couple of her palpable nodes.  Her husband asked about her cancer progression and I informed them that the lymphoma is stable but has weakened her immune system. I will see her back in 1 month with  CBC and CMP and if she need more IV fluids she can give [place] a call. She  denies signs of infections such as sore throat, sinus drainage, cough or urinary symptoms. She  denies fever or recurrent chills. She  also denies, chest pain dyspnea or cough. Her  appetite is not to good and Her  weight is decreased by 3 pounds in the last 2 weeks. She is accompanied at today's visit with her husband.   REVIEW OF SYSTEMS:  Review of Systems  Constitutional:  Positive for fatigue and unexpected weight change. Negative for appetite change, chills, diaphoresis and fever.  HENT:  Negative.  Negative for hearing loss, lump/mass, mouth sores, nosebleeds, sore throat, tinnitus, trouble swallowing and voice change.        Change of smell   Eyes:  Negative for icterus.  Respiratory:  Positive for cough. Negative for chest tightness, hemoptysis, shortness of breath and wheezing.   Cardiovascular:  Negative for chest pain, leg swelling and palpitations.  Gastrointestinal:  Positive for diarrhea, nausea and vomiting. Negative for abdominal distention, abdominal pain, blood in stool, constipation and rectal pain.  Endocrine: Negative.   Genitourinary:  Negative for bladder incontinence, difficulty urinating, dyspareunia, dysuria, frequency, hematuria, menstrual problem, nocturia, pelvic pain, vaginal bleeding and vaginal discharge.   Musculoskeletal:  Positive for arthralgias (right shoulder improved but still hurts). Negative for back pain, flank pain, gait problem, myalgias, neck pain and neck stiffness.       Hip pain  Skin: Negative.  Negative for itching, rash and wound.  Neurological:  Positive for headaches. Negative for dizziness, extremity weakness, gait problem, light-headedness, numbness, seizures and speech difficulty.  Hematological: Negative.  Negative for adenopathy. Does not bruise/bleed easily.  Psychiatric/Behavioral:  Positive for sleep disturbance. Negative for confusion, decreased concentration,  depression and suicidal ideas. The patient is not nervous/anxious.     VITALS:  Blood pressure (!) 157/97, pulse 96, temperature 98 F (36.7 C), temperature source Oral, resp. rate 16, height 5' (1.524 m), weight 110 lb 1.6 oz (49.9 kg), SpO2 98 %.  Wt Readings from Last 3 Encounters:  11/08/22 110 lb 1.6 oz (49.9 kg)  10/29/22 113 lb 6.4 oz (51.4 kg)  07/31/22 125 lb 8 oz (56.9 kg)    Body mass index is 21.5 kg/m.  Performance status (ECOG): 2 - Symptomatic, <50% confined to bed  PHYSICAL EXAM:  Physical Exam Vitals and nursing note reviewed. Exam conducted with a chaperone present.  Constitutional:      General: She is not in acute distress.    Appearance: Normal appearance. She is normal weight. She is not ill-appearing, toxic-appearing or diaphoretic.  HENT:     Head: Normocephalic and atraumatic.     Right Ear: Tympanic membrane, ear canal and external ear normal.  There is no impacted cerumen.     Left Ear: Tympanic membrane, ear canal and external ear normal. There is no impacted cerumen.     Nose: Nose normal. No congestion or rhinorrhea.     Mouth/Throat:     Mouth: Mucous membranes are moist.     Pharynx: Oropharynx is clear. No oropharyngeal exudate or posterior oropharyngeal erythema.  Eyes:     General: No scleral icterus.       Right eye: No discharge.        Left eye: No discharge.     Extraocular Movements: Extraocular movements intact.     Conjunctiva/sclera: Conjunctivae normal.     Pupils: Pupils are equal, round, and reactive to light.  Neck:     Vascular: No carotid bruit.     Comments:   Cardiovascular:     Rate and Rhythm: Normal rate and regular rhythm.     Pulses: Normal pulses.     Heart sounds: Normal heart sounds. No murmur heard.    No friction rub. No gallop.  Pulmonary:     Effort: Pulmonary effort is normal. No respiratory distress.     Breath sounds: Normal breath sounds. No stridor. No wheezing, rhonchi or rales.  Chest:     Chest  wall: No tenderness.  Abdominal:     General: Bowel sounds are normal. There is no distension.     Palpations: Abdomen is soft. There is no hepatomegaly, splenomegaly or mass.     Tenderness: There is no abdominal tenderness. There is no right CVA tenderness, left CVA tenderness, guarding or rebound.     Hernia: No hernia is present.  Musculoskeletal:        General: No swelling, tenderness, deformity or signs of injury. Normal range of motion.     Cervical back: Normal range of motion and neck supple. No rigidity or tenderness.     Right lower leg: No edema.     Left lower leg: No edema.  Lymphadenopathy:     Cervical: Cervical adenopathy present.     Right cervical: Superficial cervical adenopathy and posterior cervical adenopathy present.     Left cervical: Posterior cervical adenopathy present.     Upper Body:     Right upper body: Supraclavicular adenopathy and axillary adenopathy present. No pectoral adenopathy.     Left upper body: Axillary adenopathy present. No supraclavicular or pectoral adenopathy.     Lower Body: Right inguinal adenopathy present. Left inguinal adenopathy present.     Comments: 1cm node in the left cervical area 2 cm in in the post right cervical area Multi nodes in both supraclavicular areas L > R 3cm node in the left axilla and another one in the right axilla which is a smaller node superior to that  Skin:    General: Skin is warm and dry.     Coloration: Skin is not jaundiced or pale.     Findings: No bruising, erythema, lesion or rash.     Comments: Her skin had decreased turgor  Neurological:     General: No focal deficit present.     Mental Status: She is alert and oriented to person, place, and time. Mental status is at baseline.     Cranial Nerves: No cranial nerve deficit.     Sensory: No sensory deficit.     Motor: No weakness.     Coordination: Coordination normal.     Gait: Gait normal.     Deep Tendon Reflexes: Reflexes normal.  Psychiatric:        Mood and Affect: Mood normal.        Behavior: Behavior normal.        Thought Content: Thought content normal.        Judgment: Judgment normal.     LABS:      Latest Ref Rng & Units 10/28/2022    9:50 AM 07/30/2022    9:09 AM 07/14/2022   11:26 PM  CBC  WBC 4.0 - 10.5 K/uL 6.3  4.6  5.6   Hemoglobin 12.0 - 15.0 g/dL 81.1  91.4  78.2   Hematocrit 36.0 - 46.0 % 35.0  36.7  36.5   Platelets 150 - 400 K/uL 317  185  194       Latest Ref Rng & Units 10/28/2022    9:50 AM 07/30/2022    9:09 AM 07/14/2022   11:26 PM  CMP  Glucose 70 - 99 mg/dL 956  213  086   BUN 8 - 23 mg/dL 15  14  18    Creatinine 0.44 - 1.00 mg/dL 5.78  4.69  6.29   Sodium 135 - 145 mmol/L 136  141  139   Potassium 3.5 - 5.1 mmol/L 3.9  3.8  4.3   Chloride 98 - 111 mmol/L 99  106  103   CO2 22 - 32 mmol/L 24  27  26    Calcium 8.9 - 10.3 mg/dL 9.1  8.7  9.3   Total Protein 6.5 - 8.1 g/dL 7.8  7.2  6.7   Total Bilirubin 0.3 - 1.2 mg/dL 0.2  0.1  0.2   Alkaline Phos 38 - 126 U/L 64  40  47   AST 15 - 41 U/L 25  21  20    ALT 0 - 44 U/L 29  14  15     Component Ref Range & Units 10/28/2022  T4, Total 4.5 - 12.0 ug/dL 7.4   Component Ref Range & Units 10/28/2022  TSH 0.350 - 4.500 uIU/mL 1.084   Component Ref Range & Units 10/28/2022 1 yr ago  Hgb A1c MFr Bld 4.8 - 5.6 % 6.1 High  6.1 High  CM  Mean Plasma Glucose mg/dL 528 413.24 CM   Component Ref Range & Units 10/28/2022 1 yr ago  Cholesterol 0 - 200 mg/dL 401 027  Triglycerides <150 mg/dL 253 72  HDL >66 mg/dL 28 Low  53  Total CHOL/HDL Ratio RATIO 5.6 2.7  VLDL 0 - 40 mg/dL 23 14  LDL Cholesterol 0 - 99 mg/dL 440 High  78 CM   Component Ref Range & Units 10/28/2022 3 mo ago 12 mo ago 1 yr ago  Vitamin B-12 180 - 914 pg/mL 1,179 High  350 CM 1,018 High  CM 199 CM      No results found for: "CEA1", "CEA" / No results found for: "CEA1", "CEA" No results found for: "PSA1" No results found for: "HKV425" No results  found for: "CAN125"  No results found for: "TOTALPROTELP", "ALBUMINELP", "A1GS", "A2GS", "BETS", "BETA2SER", "GAMS", "MSPIKE", "SPEI" Lab Results  Component Value Date   TIBC 331 07/23/2021   FERRITIN 22 07/23/2021   IRONPCTSAT 20 07/23/2021   Lab Results  Component Value Date   LDH 136 10/30/2021   LDH 142 07/20/2021   LDH 147 04/26/2021    STUDIES:   EXAM: 11/06/2022 CT CHEST, ABDOMEN, AND PELVIS WITH CONTRAST CHEST IMPRESSION:  Stable bilateral axillary and supraclavicular lymphadenopathy. No mediastinal lymphadenopathy. New band of linear consolidation  in the LEFT upper lobe. Probable post infectious consolidation. Lymphoma not excluded but not favored.  PELVIS IMPRESSION: Stable retroperitoneal and proximal iliac lymphadenopathy.  No new adenopathy in the abdomen and pelvis.  Normal spleen. No skeletal metastasis.    EXAM: 07/15/2022 CT ORBITS WITHOUT CONTRAST FINDINGS: Orbits: No orbital mass or evidence of inflammation. Normal appearance of the globes, optic nerve-sheath complexes, extraocular muscles, orbital fat and lacrimal glands. Visible paranasal sinuses: Clear. Soft tissues: Normal. Osseous: No fracture or aggressive lesion. Limited intracranial: Negative.  IMPRESSION: Normal CT of the orbits.  EXAM: 07/15/2022 CT HEAD WITHOUT CONTRAST  FINDINGS: Brain:  No evidence of large-territorial acute infarction. No parenchymal hemorrhage. No mass lesion. No extra-axial collection.  No mass effect or midline shift. No hydrocephalus. Basilar cisterns are patent.  Vascular: No hyperdense vessel.  Skull: No acute fracture or focal lesion.  Sinuses/Orbits: Paranasal sinuses and mastoid air cells are clear. The orbits are unremarkable.  Other: None.   IMPRESSION: No acute intracranial abnormality.         EXAM: 03/08/2022 ULTRASOUND OF THE right AXILLA 03/14/22 IMPRESSION: Enlarged right axillary lymph nodes consistent with known lymphoma.         HISTORY:   Past Medical History:  Diagnosis Date   Allergy    seasonal   Arthritis    Bipolar 1 disorder (HCC)    Cancer (HCC)    non hodgkins lymphoma   Depression    GERD (gastroesophageal reflux disease)    History of degenerative disc disease    Hyperlipidemia    Increased risk of breast cancer 04/25/2021   Obstructive sleep apnea    Osteopenia    Scoliosis    Thyroid disease    hypothyroidism   Vitamin D deficiency     Past Surgical History:  Procedure Laterality Date   BREAST BIOPSY     BUBBLE STUDY  02/13/2021   Procedure: BUBBLE STUDY;  Surgeon: Chilton Si, MD;  Location: [facility] ENDOSCOPY;  Service: Cardiovascular;;   CARPAL TUNNEL RELEASE Bilateral 2002   CESAREAN SECTION     x2   COLONOSCOPY  07/26/2008   Melanosis coli. Small internal hemorrhoids.    ENDOSCOPIC PLANTAR FASCIOTOMY     ESOPHAGOGASTRODUODENOSCOPY  03/17/2013   Mild gastritis. Status post esophageal dilatation.   LYMPH NODE BIOPSY     right hip repair torn tendon Right 09/2021   TEE WITHOUT CARDIOVERSION N/A 02/13/2021   Procedure: TRANSESOPHAGEAL ECHOCARDIOGRAM (TEE);  Surgeon: Chilton Si, MD;  Location: [facility] ENDOSCOPY;  Service: Cardiovascular;  Laterality: N/A;   WISDOM TOOTH EXTRACTION      Family History  Problem Relation Age of Onset   Prostate cancer Father 75   Melanoma Father 61   High blood pressure Father    Breast cancer Maternal Aunt    Multiple myeloma Maternal Aunt    Breast cancer Maternal Aunt    Colon cancer Neg Hx    Colon polyps Neg Hx    Esophageal cancer Neg Hx    Rectal cancer Neg Hx    Stomach cancer Neg Hx     Social History:  reports that she has never smoked. She has never used smokeless tobacco. She reports that she does not currently use alcohol. She reports that she does not use drugs.The patient is accompanied by her husband today.  Allergies:  Allergies  Allergen Reactions   Diclofenac Sodium     Thought it had something to do  with her having a stroke   Voltaren [Diclofenac Sodium]  Thought it had something to do with her having a stroke   Nsaids     Had a stroke and worries it was related to the Diclofenac she took.    Current Medications: Current Outpatient Medications  Medication Sig Dispense Refill   Calcium Carb-Cholecalciferol (CALCIUM 600 + D PO) Take by mouth. Twice daily     cefdinir (OMNICEF) 300 MG capsule      Cholecalciferol (VITAMIN D) 50 MCG (2000 UT) CAPS Take by mouth.     Cyanocobalamin (B-12 PO) Take by mouth. Once a day     famotidine (PEPCID) 40 MG tablet Take 40 mg by mouth daily.     Multiple Vitamin (MULTIVITAMIN ADULT PO) Take by mouth. Vision MD once a day     ondansetron (ZOFRAN) 4 MG tablet Take 1 tablet (4 mg total) by mouth every 8 (eight) hours as needed for nausea or vomiting. 20 tablet 1   promethazine-dextromethorphan (PROMETHAZINE-DM) 6.25-15 MG/5ML syrup Take by mouth.     ramipril (ALTACE) 2.5 MG capsule Take by mouth.     No current facility-administered medications for this visit.      I,Oluwatobi Asade,acting as a scribe for Dellia Beckwith, MD.,have documented all relevant documentation on the behalf of Dellia Beckwith, MD,as directed by  Dellia Beckwith, MD while in the presence of Dellia Beckwith, MD.

## 2022-11-08 NOTE — Telephone Encounter (Signed)
Pt's spouse,Mark, called to ask about results of scan. He also mentioned that Roddie Mc had PNA 2 weeks ago. She has loss some weight, no appetite and feels weak. She finished her antibiotic on Sunday. Please advise.

## 2022-11-08 NOTE — Telephone Encounter (Signed)
Called patient and notified her that Dr.McCarty can see her at 1530 today. [facility] to get a STAT read on the CT CAP that was done 11/06/22.

## 2022-11-14 ENCOUNTER — Ambulatory Visit: Payer: BC Managed Care – PPO | Admitting: Dietician

## 2022-11-14 NOTE — Progress Notes (Signed)
Nutrition Assessment Called patient at home/cell telephone#  Reason for Assessment: MST screen for weight loss.    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 Gilman Buttner.  She reports recently came off Seroquel which has helped to reduce appetite and her recent PNA infection treated with Abx she was too sick to eat much.  She feels she's starting to eat better and appetite is improving. She was having diarrhea with Abx, but not constipated last BM 11/08/22.  She reports tries to eat healthy was following 56 day diet, tries to avoid sugar uses Stevia as substitute. .Was drinking protein shake with greens (stopped this past month), also used to include Almond milk.   This  week PO intake: Yogurts, lots of rice   Meat 2 veg or lean sandwich,, not a lot of bread mostly crackers ( likes varied protein) Snack:  Popcorn or  sandwich pimento cheese sand., likes berries and watermelon.    Can't eat volume. Lots of water( 16oz), Zevia (stevia) soda (24oz), juices (12-18oz), sweet tea (with [phone])    Anthropometrics: Patient has lost 15.5# (12.4%) past 3 months, significant for time period  Height: 60" Weight:  11/08/22  110# 07/31/22  125.5# UBW: 125-130, DBW: 120# BMI: 21.5    NUTRITION DIAGNOSIS: Inadequate PO intake to meet increased nutrient needs, r/t recent infection and diarrhea  INTERVENTION:  Relayed that nutrition services are wrap around service provided at no charge and encouraged continued communication if experiencing continued weight loss or any nutritional impact symptoms (NIS). Educated on importance of adequate calorie and protein energy intake  with nutrient dense foods when possible to maintain weight/strength Discussed strategies for increasing Ca+ and Vit D. Reviewed tips for constipation (encouraged stool softener on day 3 no BM for 2 days, start Miralax on day 4)  Encouraged  weekly weights to monitor progress with goal of slow weight gain/maintenance Emailed Nutrition Tip sheet  for  constipation with contact information provided.   MONITORING, EVALUATION, GOAL: weight, PO intake, Nutrition Impact Symptoms, labs  Goal is weight maintenance or slow regain 2-4#/month to DBW  Next Visit: PRN at patient or provider request  Gennaro Africa, RDN, LDN Registered Dietitian, [facility] Part Time Remote (Usual office hours: Tuesday-Thursday) Cell: [phone]

## 2022-11-15 ENCOUNTER — Ambulatory Visit: Payer: BC Managed Care – PPO | Admitting: Oncology

## 2022-11-19 ENCOUNTER — Encounter: Payer: Self-pay | Admitting: Oncology

## 2022-12-05 DIAGNOSIS — Z124 Encounter for screening for malignant neoplasm of cervix: Secondary | ICD-10-CM | POA: Diagnosis not present

## 2022-12-05 DIAGNOSIS — Z01419 Encounter for gynecological examination (general) (routine) without abnormal findings: Secondary | ICD-10-CM | POA: Diagnosis not present

## 2022-12-05 DIAGNOSIS — N952 Postmenopausal atrophic vaginitis: Secondary | ICD-10-CM | POA: Diagnosis not present

## 2022-12-05 DIAGNOSIS — Z6821 Body mass index (BMI) 21.0-21.9, adult: Secondary | ICD-10-CM | POA: Diagnosis not present

## 2022-12-05 DIAGNOSIS — N941 Unspecified dyspareunia: Secondary | ICD-10-CM | POA: Diagnosis not present

## 2022-12-06 NOTE — Progress Notes (Signed)
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  She has been on observation only. She has had stable bilateral cervical and inguinal lymphadenopathy.  MRI imaging from February 2023 revealed extensive inguinal and retroperitoneal lymphadenopathy.  Her physical exam reveals a modest change in her cervical and supraclavicular adenopathy. PET scan in March revealed continued stability of mild adenopathy in the neck, chest, abdomen and pelvis with low level FDG and Deauville 2-3 category uptake. The latest CT in September of 2023 shows the numerous mildly enlarged nodes to be unchanged. Her lymphadenopathy is relatively stable at this time but just mildly increased. Her CT chest, abdomen and pelvis done on 11/06/22 revealed stable bilateral axillary and supraclavicular adenopathy, no mediastinal lymphadenopathy, a new band of linear consolidation in the LEFT upper lobe, which is probably post infectious consolidation. Lymphoma is not excluded but not favored.  She has stable retroperitoneal and proximal iliac lymphadenopathy, and no new adenopathy in the abdomen and pelvis.  There is a normal spleen and no skeletal metastasis.   B12 deficiency anemia She was treated with B12 injections then transitioned to oral B12.  She is no longer taking B12 as her B12 level has been elevated at the [facility].  On May 11, B12 was greater than 2000, iron studies and folate were normal.  She remains off B12 as her last level in June was over 1,000. Now it has dropped to 350 and so I had advised her to get back on oral B-12 and we will recheck her level later.  Pneumonia Left upper lobe, lobar pneumonia in May 2024, has been  treated with multiple antibiotics and is clinically improving. CT scan shows resolution with a band of linear consolidation in the l;eft upper lobe.  Acute Diarrhea This is likely from multiple antibiotics and was suspicious for C.dificile. We gave her IV fluids in June. This is now resolved.    Plan I seen her last month after she was diagnosed with pneumonia, and by then she was due for restaging scans. Her CT chest, abdomen and pelvis done on 11/06/22 revealed stable bilateral axillary and supraclavicular adenopathy, no mediastinal lymphadenopathy, a new band of linear consolidation in the LEFT upper lobe, which is probably post infectious consolidation. Lymphoma is not excluded but not favored.  She has stable retroperitoneal and proximal iliac lymphadenopathy, and no new adenopathy in the abdomen and pelvis.  There is a normal spleen and no skeletal metastasis. She has mild increase in the number and size of her palpable nodes. Her WBC is 6.9 with 61% lymphocytes, hemoglobin dropped from 11.1 to 10.9, we checked her iron studies and she is not deficient, her B-12 level was over 1,000 again and her platelet count is 212,000 and her absolute lymphocytes (ALC) increased from 2.0 to 4.2 as of 12/12/2022. I informed her that at this time, she does not need to be treated unless her hemoglobin drops to 8 and below or she has increasing symptomatic adenopathy. Her CMP is normal other than her calcium dropping from 9.1 to 8.7 as of yesterday. She continues to take oral calcium supplements daily, and I recommended she do so twice daily. I will see her back in 2 months with CBC and CMP. The patient  and her husband understand the plans discussed today and are in agreement with them.  She knows to contact our office if she develops concerns prior to her next appointment.   I provided 20 minutes of face-to-face time during this encounter and > 50% was spent counseling as documented under my assessment and plan.     Dellia Beckwith, MD  [facility] [address] [place] [address] Dept: [phone] Dept Fax: [phone]   No orders of the defined types were placed in this encounter.   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  Staging CT chest revealed adenopathy of the bilateral neck measuring up to 18 mm in diameter with left subclavian, bilateral axillary and subpectoral adenopathy, but no mediastinal nodes.  CT abdomen was  negative.  CT pelvis revealed bilateral external iliac nodes up to 11 mm in diameter.  She had B symptoms with severe night sweats and weight loss.  She has been on  observation only.  Due to her family history of breast cancer, she underwent testing for hereditary breast and ovarian cancer with the [employer] myRisk Hereditary Cancer Gene panel test.  This did not reveal any clinically significant mutation.  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  Her [facility] breast cancer risk assessment showed her lifetime risk of breast cancer to be 29.7%, so annual breast MRI, in addition to mammogram is recommended.  Mammogram and MRI breast done in August 2019 did not reveal any evidence of malignancy.  She did go for a second opinion to [place] regarding her lymphoma and they concurred with the approach of watchful waiting.    She presented to the emergency room in mid April 2021 due to increased urinary frequency and discomfort.  CT imaging revealed left sided obstructive uropathy with 3 mm calculus in the distal left ureter just proximal to the ureterovesical unction causing moderate hydroureteronephrosis and perinephric stranding.  Left greater than right iliac and pelvic lymphadenopathy,  slightly greater than on 10/26/18, consistent with known history of lymphoma.  We did not repeat a scan in June as scheduled.  Annual screening bilateral mammogram from August 2021 was clear.  MRI breast has not been repeated due to her comorbidities.   CT neck in May 2022 revealed multiple lymph nodes in the neck bilaterally, with mild progression of lymph nodes on the right. There has been more significant progression of left level 4 and left supraclavicular lymph nodes compared to the prior study. Largest lymph node in the left supraclavicular region measures 35 x 17 mm. Findings were compatible with lymphoma. CT chest/abdomen/pelvis revealed no substantial interval change in exam. Bilateral supraclavicular, subpectoral, axillary, retroperitoneal, and pelvic lymphadenopathy was similar to prior. There were no definite findings of progression.  At her visit in September, the lymphoma remained stable. She continued to have sciatic pain unrelieved with gabapentin 800 mg TID. She had steroid injections of the spine with improvement.    Bilateral screening mammogram in January revealed a possible asymmetry in the left breast.  She underwent left diagnostic mammogram and the asymmetry disperses with compression, so it is felt to be dense fibroglandular tissue.  There is no evidence of malignancy.  Bilateral screening in 1 year was recommended.  MRI  of the right hip from February 2023 revealed extensive inguinal and retroperitoneal lymphadenopathy. There are high-grade tears of the right gluteus numbness greater than medius with background tendinosis and trochanteric bursitis, and bilateral hamstring origin tendinosis with mild partial tearing. She has mild bilateral hip osteoarthritis, and  right sided labral tear. She states that she has chronic palpable lymph nodes of the left neck and supraclavicular area.  She therefore underwent PET scan in March, which revealed stable disease when compared to previous CT  images.   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)  11/11/2015 Cancer Staging   Staging form: Hodgkin and Non-Hodgkin Lymphoma, AJCC 8th Edition - Clinical stage from 11/11/2015: Stage III (Small lymphocytic leukemia) - Signed by Dellia Beckwith, MD on 01/28/2021 Histopathologic type: Malignant lymphoma, small B lymphocytic, NOS (see also M-9823/3) Stage prefix: Initial diagnosis Diagnostic confirmation: Positive histology PLUS positive immunophenotyping and/or positive genetic studies Specimen type: Core Needle Biopsy Staged by: Managing physician Stage used in treatment planning: Yes National guidelines used in treatment planning: Yes Type of national guideline used in treatment planning: NCCN Staging comments: Watchful waiting   06/20/2020 Initial Diagnosis   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)   Chronic lymphocytic leukemia (CLL), B-cell (HCC)  10/31/2015 Initial Diagnosis   Chronic lymphocytic leukemia (CLL), B-cell (HCC)   11/11/2015 Cancer Staging   Staging form: Chronic Lymphocytic Leukemia / Small Lymphocytic Lymphoma, AJCC 8th Edition - Clinical stage from 11/11/2015: Modified Rai Stage III (Modified Rai risk: High, Binet: Stage B, Lugano: Stage III, Lymphocytosis: Absent, Adenopathy: Present, Organomegaly: Absent, Anemia: Absent, Thrombocytopenia: Absent) - Signed by Dellia Beckwith, MD on 04/05/2021 Histopathologic type: B-cell lymphocytic leukemia/small lymphocytic lymphoma (see also M-9670/3) Stage prefix: Initial diagnosis Stage used in treatment planning: Yes National guidelines used in treatment planning: Yes Type of national guideline used in treatment planning: NCCN       INTERVAL HISTORY:  Dawn Prince  is here today for repeat clinical assessment for small cell B cell lymphoma. She has never required treatment for her small cell B cell lymphoma. Patient states that she feels well and has no  complaints of pain. She informed me that she recently pneumonia in the middle of June. She is still recovering and still experiences some fatigue. I seen her last month after she was diagnosed with pneumonia and by then she was due for restaging scans. Her CT chest, abdomen and pelvis done on 11/06/22 revealed stable bilateral axillary and supraclavicular adenopathy, no mediastinal lymphadenopathy, a new band of linear consolidation in the LEFT upper lobe, which is probably post infectious consolidation. Lymphoma is not excluded but not favored.  She has stable retroperitoneal and proximal iliac lymphadenopathy, and no new adenopathy in the abdomen and pelvis.  There is a normal spleen and no skeletal metastasis. She has mild increase in the number and size of her palpable nodes. Her WBC is 6.9 with 61% lymphocytes, hemoglobin dropped from 11.1 to 10.9, we checked her iron studies and she is not deficient, her B-12 level was over 1,000 and her platelet count is 212,000 and her absolute lymphocytes (ALC) increased from 2.0 to 4.2 as of 12/12/2022.  I explained that her increasing lymphocytosis is behaving more like CLL than lymphoma.  I informed her that at this moment does not need to be treated unless her hemoglobin drops to 8 and below or she has worsening symptomatic adenopathy. Her CMP is normal other than her calcium dropping from 9.1  to 8.7 as of yesterday. She continues to take oral calcium supplements daily and I recommended that she increase to twice daily. I will see her back in 2 months with CBC and CMP. She denies signs of infection such as sore throat, sinus drainage, cough, or urinary symptoms.  She denies fevers or recurrent chills. She denies pain. She denies nausea, vomiting, chest pain, dyspnea or cough. Her appetite is improved and is coming back and her weight has decreased 5 pounds over last month .She is accompanied at today's visit with her husband.   REVIEW OF SYSTEMS:  Review of Systems   Constitutional:  Positive for fatigue and unexpected weight change. Negative for appetite change, chills, diaphoresis and fever.  HENT:  Negative.  Negative for hearing loss, lump/mass, mouth sores, nosebleeds, sore throat, tinnitus, trouble swallowing and voice change.        Change of smell   Eyes:  Negative for icterus.  Respiratory:  Positive for cough. Negative for chest tightness, hemoptysis, shortness of breath and wheezing.   Cardiovascular:  Negative for chest pain, leg swelling and palpitations.  Gastrointestinal:  Negative for abdominal distention, abdominal pain, blood in stool, constipation, diarrhea, nausea, rectal pain and vomiting.  Endocrine: Negative.   Genitourinary:  Negative for bladder incontinence, difficulty urinating, dyspareunia, dysuria, frequency, hematuria, menstrual problem, nocturia, pelvic pain, vaginal bleeding and vaginal discharge.   Musculoskeletal:  Positive for arthralgias (right shoulder improved but still hurts). Negative for back pain, flank pain, gait problem, myalgias, neck pain and neck stiffness.       Hip pain  Skin: Negative.  Negative for itching, rash and wound.  Neurological:  Positive for headaches. Negative for dizziness, extremity weakness, gait problem, light-headedness, numbness, seizures and speech difficulty.  Hematological:  Negative for adenopathy. Bruises/bleeds easily.  Psychiatric/Behavioral:  Positive for sleep disturbance. Negative for confusion, decreased concentration, depression and suicidal ideas. The patient is not nervous/anxious.     VITALS:  Blood pressure 128/86, pulse 87, temperature 98.1 F (36.7 C), temperature source Oral, resp. rate 12, height 5' (1.524 m), weight 108 lb 3.2 oz (49.1 kg), SpO2 99%.  Wt Readings from Last 3 Encounters:  12/13/22 108 lb 3.2 oz (49.1 kg)  11/08/22 110 lb 1.6 oz (49.9 kg)  10/29/22 113 lb 6.4 oz (51.4 kg)    Body mass index is 21.13 kg/m.  Performance status (ECOG): 2 -  Symptomatic, <50% confined to bed  PHYSICAL EXAM:  Physical Exam Vitals and nursing note reviewed. Exam conducted with a chaperone present.  Constitutional:      General: She is not in acute distress.    Appearance: Normal appearance. She is normal weight. She is not ill-appearing, toxic-appearing or diaphoretic.  HENT:     Head: Normocephalic and atraumatic.     Right Ear: Tympanic membrane, ear canal and external ear normal. There is no impacted cerumen.     Left Ear: Tympanic membrane, ear canal and external ear normal. There is no impacted cerumen.     Nose: Nose normal. No congestion or rhinorrhea.     Mouth/Throat:     Mouth: Mucous membranes are moist.     Pharynx: Oropharynx is clear. No oropharyngeal exudate or posterior oropharyngeal erythema.  Eyes:     General: No scleral icterus.       Right eye: No discharge.        Left eye: No discharge.     Extraocular Movements: Extraocular movements intact.     Conjunctiva/sclera: Conjunctivae normal.  Pupils: Pupils are equal, round, and reactive to light.  Neck:     Vascular: No carotid bruit.     Comments:   Cardiovascular:     Rate and Rhythm: Normal rate and regular rhythm.     Pulses: Normal pulses.     Heart sounds: Normal heart sounds. No murmur heard.    No friction rub. No gallop.  Pulmonary:     Effort: Pulmonary effort is normal. No respiratory distress.     Breath sounds: Normal breath sounds. No stridor. No wheezing, rhonchi or rales.  Chest:     Chest wall: No tenderness.  Abdominal:     General: Bowel sounds are normal. There is no distension.     Palpations: Abdomen is soft. There is no hepatomegaly, splenomegaly or mass.     Tenderness: There is no abdominal tenderness. There is no right CVA tenderness, left CVA tenderness, guarding or rebound.     Hernia: No hernia is present.  Musculoskeletal:        General: No swelling, tenderness, deformity or signs of injury. Normal range of motion.      Cervical back: Normal range of motion and neck supple. No rigidity or tenderness.     Right lower leg: No edema.     Left lower leg: No edema.  Lymphadenopathy:     Cervical: Cervical adenopathy present.     Right cervical: Superficial cervical adenopathy and posterior cervical adenopathy present.     Left cervical: Posterior cervical adenopathy present.     Upper Body:     Right upper body: Supraclavicular adenopathy and axillary adenopathy present. No pectoral adenopathy.     Left upper body: Axillary adenopathy present. No supraclavicular or pectoral adenopathy.     Lower Body: Right inguinal adenopathy present. Left inguinal adenopathy present.     Comments: Multiple nodes in the bilateral, cervical, and supraclavicular area that measure 1-2cm She has a 3cm node in the left axilla.  3 nodes in the right axilla measuring 2cm.  Skin:    General: Skin is warm and dry.     Coloration: Skin is not jaundiced or pale.     Findings: No bruising, erythema, lesion or rash.     Comments: Scattered ecchymosis of her legs.   Neurological:     General: No focal deficit present.     Mental Status: She is alert and oriented to person, place, and time. Mental status is at baseline.     Cranial Nerves: No cranial nerve deficit.     Sensory: No sensory deficit.     Motor: No weakness.     Coordination: Coordination normal.     Gait: Gait normal.     Deep Tendon Reflexes: Reflexes normal.  Psychiatric:        Mood and Affect: Mood normal.        Behavior: Behavior normal.        Thought Content: Thought content normal.        Judgment: Judgment normal.     LABS:      Latest Ref Rng & Units 12/12/2022    9:06 AM 10/28/2022    9:50 AM 07/30/2022    9:09 AM  CBC  WBC 4.0 - 10.5 K/uL 6.9  6.3  4.6   Hemoglobin 12.0 - 15.0 g/dL 40.9  81.1  91.4   Hematocrit 36.0 - 46.0 % 35.0  35.0  36.7   Platelets 150 - 400 K/uL 212  317  185  Latest Ref Rng & Units 12/12/2022    9:06 AM 10/28/2022     9:50 AM 07/30/2022    9:09 AM  CMP  Glucose 70 - 99 mg/dL 95  161  096   BUN 8 - 23 mg/dL 16  15  14    Creatinine 0.44 - 1.00 mg/dL 0.45  4.09  8.11   Sodium 135 - 145 mmol/L 136  136  141   Potassium 3.5 - 5.1 mmol/L 3.8  3.9  3.8   Chloride 98 - 111 mmol/L 102  99  106   CO2 22 - 32 mmol/L 26  24  27    Calcium 8.9 - 10.3 mg/dL 8.7  9.1  8.7   Total Protein 6.5 - 8.1 g/dL 6.9  7.8  7.2   Total Bilirubin 0.3 - 1.2 mg/dL 0.5  0.2  0.1   Alkaline Phos 38 - 126 U/L 49  64  40   AST 15 - 41 U/L 20  25  21    ALT 0 - 44 U/L 16  29  14     Component Ref Range & Units 10/28/2022  T4, Total 4.5 - 12.0 ug/dL 7.4   Component Ref Range & Units 10/28/2022  TSH 0.350 - 4.500 uIU/mL 1.084   Component Ref Range & Units 10/28/2022 1 yr ago  Hgb A1c MFr Bld 4.8 - 5.6 % 6.1 High  6.1 High  CM  Mean Plasma Glucose mg/dL 914 782.95 CM   Component Ref Range & Units 10/28/2022 1 yr ago  Cholesterol 0 - 200 mg/dL 621 308  Triglycerides <150 mg/dL 657 72  HDL >84 mg/dL 28 Low  53  Total CHOL/HDL Ratio RATIO 5.6 2.7  VLDL 0 - 40 mg/dL 23 14  LDL Cholesterol 0 - 99 mg/dL 696 High  78 CM   Component Ref Range & Units 10/28/2022 3 mo ago 12 mo ago 1 yr ago  Vitamin B-12 180 - 914 pg/mL 1,179 High  350 CM 1,018 High  CM 199 CM      No results found for: "CEA1", "CEA" / No results found for: "CEA1", "CEA" No results found for: "PSA1" No results found for: "EXB284" No results found for: "CAN125"  No results found for: "TOTALPROTELP", "ALBUMINELP", "A1GS", "A2GS", "BETS", "BETA2SER", "GAMS", "MSPIKE", "SPEI" Lab Results  Component Value Date   TIBC 322 12/12/2022   TIBC 331 07/23/2021   FERRITIN 19 12/12/2022   FERRITIN 22 07/23/2021   IRONPCTSAT 30 12/12/2022   IRONPCTSAT 20 07/23/2021   Lab Results  Component Value Date   LDH 136 10/30/2021   LDH 142 07/20/2021   LDH 147 04/26/2021    STUDIES:   EXAM: 11/06/2022 CT CHEST, ABDOMEN, AND PELVIS WITH CONTRAST CHEST  IMPRESSION:  Stable bilateral axillary and supraclavicular lymphadenopathy. No mediastinal lymphadenopathy. New band of linear consolidation in the LEFT upper lobe. Probable post infectious consolidation. Lymphoma not excluded but not favored.  PELVIS IMPRESSION: Stable retroperitoneal and proximal iliac lymphadenopathy.  No new adenopathy in the abdomen and pelvis.  Normal spleen. No skeletal metastasis.       HISTORY:   Past Medical History:  Diagnosis Date   Allergy    seasonal   Arthritis    Bipolar 1 disorder (HCC)    Cancer (HCC)    non hodgkins lymphoma   Depression    GERD (gastroesophageal reflux disease)    History of degenerative disc disease    Hyperlipidemia    Increased risk of breast cancer 04/25/2021  Obstructive sleep apnea    Osteopenia    Scoliosis    Thyroid disease    hypothyroidism   Vitamin D deficiency     Past Surgical History:  Procedure Laterality Date   BREAST BIOPSY     BUBBLE STUDY  02/13/2021   Procedure: BUBBLE STUDY;  Surgeon: Chilton Si, MD;  Location: [facility] ENDOSCOPY;  Service: Cardiovascular;;   CARPAL TUNNEL RELEASE Bilateral 2002   CESAREAN SECTION     x2   COLONOSCOPY  07/26/2008   Melanosis coli. Small internal hemorrhoids.    ENDOSCOPIC PLANTAR FASCIOTOMY     ESOPHAGOGASTRODUODENOSCOPY  03/17/2013   Mild gastritis. Status post esophageal dilatation.   LYMPH NODE BIOPSY     right hip repair torn tendon Right 09/2021   TEE WITHOUT CARDIOVERSION N/A 02/13/2021   Procedure: TRANSESOPHAGEAL ECHOCARDIOGRAM (TEE);  Surgeon: Chilton Si, MD;  Location: [facility] ENDOSCOPY;  Service: Cardiovascular;  Laterality: N/A;   WISDOM TOOTH EXTRACTION      Family History  Problem Relation Age of Onset   Prostate cancer Father 40   Melanoma Father 46   High blood pressure Father    Breast cancer Maternal Aunt    Multiple myeloma Maternal Aunt    Breast cancer Maternal Aunt    Colon cancer Neg Hx    Colon polyps Neg Hx     Esophageal cancer Neg Hx    Rectal cancer Neg Hx    Stomach cancer Neg Hx     Social History:  reports that she has never smoked. She has never used smokeless tobacco. She reports that she does not currently use alcohol. She reports that she does not use drugs.The patient is accompanied by her husband today.  Allergies:  Allergies  Allergen Reactions   Diclofenac Sodium     Thought it had something to do with her having a stroke   Voltaren [Diclofenac Sodium]     Thought it had something to do with her having a stroke   Nsaids     Had a stroke and worries it was related to the Diclofenac she took.    Current Medications: Current Outpatient Medications  Medication Sig Dispense Refill   Calcium Carb-Cholecalciferol (CALCIUM 600 + D PO) Take by mouth. Twice daily     Cholecalciferol (VITAMIN D) 50 MCG (2000 UT) CAPS Take by mouth.     Cyanocobalamin (B-12 PO) Take by mouth. Once a day     famotidine (PEPCID) 40 MG tablet Take 40 mg by mouth daily.     Multiple Vitamin (MULTIVITAMIN ADULT PO) Take by mouth. Vision MD once a day     Multiple Vitamins-Minerals (WOMENS MULTIVITAMIN PO) Take by mouth at bedtime.     ondansetron (ZOFRAN) 4 MG tablet Take 1 tablet (4 mg total) by mouth every 8 (eight) hours as needed for nausea or vomiting. 20 tablet 1   Probiotic Product (PROBIOTIC BLEND PO) Take by mouth at bedtime.     ramipril (ALTACE) 2.5 MG capsule Take by mouth.     No current facility-administered medications for this visit.    I,[name],acting as a scribe for Dellia Beckwith, MD.,have documented all relevant documentation on the behalf of Dellia Beckwith, MD,as directed by  Dellia Beckwith, MD while in the presence of Dellia Beckwith, MD.

## 2022-12-12 ENCOUNTER — Inpatient Hospital Stay: Payer: BC Managed Care – PPO | Attending: Oncology

## 2022-12-12 DIAGNOSIS — Z8572 Personal history of non-Hodgkin lymphomas: Secondary | ICD-10-CM | POA: Diagnosis not present

## 2022-12-12 DIAGNOSIS — D519 Vitamin B12 deficiency anemia, unspecified: Secondary | ICD-10-CM

## 2022-12-12 DIAGNOSIS — Z808 Family history of malignant neoplasm of other organs or systems: Secondary | ICD-10-CM | POA: Insufficient documentation

## 2022-12-12 DIAGNOSIS — Z79899 Other long term (current) drug therapy: Secondary | ICD-10-CM | POA: Diagnosis not present

## 2022-12-12 DIAGNOSIS — M25511 Pain in right shoulder: Secondary | ICD-10-CM | POA: Insufficient documentation

## 2022-12-12 DIAGNOSIS — R5383 Other fatigue: Secondary | ICD-10-CM | POA: Insufficient documentation

## 2022-12-12 DIAGNOSIS — R519 Headache, unspecified: Secondary | ICD-10-CM | POA: Diagnosis not present

## 2022-12-12 DIAGNOSIS — Z803 Family history of malignant neoplasm of breast: Secondary | ICD-10-CM | POA: Diagnosis not present

## 2022-12-12 DIAGNOSIS — M2550 Pain in unspecified joint: Secondary | ICD-10-CM | POA: Insufficient documentation

## 2022-12-12 DIAGNOSIS — E538 Deficiency of other specified B group vitamins: Secondary | ICD-10-CM | POA: Diagnosis not present

## 2022-12-12 DIAGNOSIS — R059 Cough, unspecified: Secondary | ICD-10-CM | POA: Insufficient documentation

## 2022-12-12 DIAGNOSIS — C8302 Small cell B-cell lymphoma, intrathoracic lymph nodes: Secondary | ICD-10-CM

## 2022-12-12 DIAGNOSIS — Z8042 Family history of malignant neoplasm of prostate: Secondary | ICD-10-CM | POA: Insufficient documentation

## 2022-12-12 DIAGNOSIS — Z8249 Family history of ischemic heart disease and other diseases of the circulatory system: Secondary | ICD-10-CM | POA: Insufficient documentation

## 2022-12-12 LAB — CBC WITH DIFFERENTIAL (CANCER CENTER ONLY)
Abs Immature Granulocytes: 0.02 10*3/uL (ref 0.00–0.07)
Basophils Absolute: 0 10*3/uL (ref 0.0–0.1)
Basophils Relative: 1 %
Eosinophils Absolute: 0.1 10*3/uL (ref 0.0–0.5)
Eosinophils Relative: 1 %
HCT: 35 % — ABNORMAL LOW (ref 36.0–46.0)
Hemoglobin: 10.9 g/dL — ABNORMAL LOW (ref 12.0–15.0)
Immature Granulocytes: 0 %
Lymphocytes Relative: 61 %
Lymphs Abs: 4.2 10*3/uL — ABNORMAL HIGH (ref 0.7–4.0)
MCH: 32.2 pg (ref 26.0–34.0)
MCHC: 31.1 g/dL (ref 30.0–36.0)
MCV: 103.2 fL — ABNORMAL HIGH (ref 80.0–100.0)
Monocytes Absolute: 0.2 10*3/uL (ref 0.1–1.0)
Monocytes Relative: 3 %
Neutro Abs: 2.4 10*3/uL (ref 1.7–7.7)
Neutrophils Relative %: 34 %
Platelet Count: 212 10*3/uL (ref 150–400)
RBC: 3.39 MIL/uL — ABNORMAL LOW (ref 3.87–5.11)
RDW: 15.5 % (ref 11.5–15.5)
WBC Count: 6.9 10*3/uL (ref 4.0–10.5)
nRBC: 0 % (ref 0.0–0.2)

## 2022-12-12 LAB — CMP (CANCER CENTER ONLY)
ALT: 16 U/L (ref 0–44)
AST: 20 U/L (ref 15–41)
Albumin: 3.5 g/dL (ref 3.5–5.0)
Alkaline Phosphatase: 49 U/L (ref 38–126)
Anion gap: 8 (ref 5–15)
BUN: 16 mg/dL (ref 8–23)
CO2: 26 mmol/L (ref 22–32)
Calcium: 8.7 mg/dL — ABNORMAL LOW (ref 8.9–10.3)
Chloride: 102 mmol/L (ref 98–111)
Creatinine: 0.78 mg/dL (ref 0.44–1.00)
GFR, Estimated: >60 mL/min (ref >60)
Glucose, Bld: 95 mg/dL (ref 70–99)
Potassium: 3.8 mmol/L (ref 3.5–5.1)
Sodium: 136 mmol/L (ref 135–145)
Total Bilirubin: 0.5 mg/dL (ref 0.3–1.2)
Total Protein: 6.9 g/dL (ref 6.5–8.1)

## 2022-12-12 LAB — IRON AND TIBC
Iron: 97 ug/dL (ref 28–170)
Saturation Ratios: 30 % (ref 10.4–31.8)
TIBC: 322 ug/dL (ref 250–450)
UIBC: 225 ug/dL

## 2022-12-12 LAB — FERRITIN: Ferritin: 19 ng/mL (ref 11–307)

## 2022-12-13 ENCOUNTER — Inpatient Hospital Stay (INDEPENDENT_AMBULATORY_CARE_PROVIDER_SITE_OTHER): Payer: BC Managed Care – PPO | Admitting: Oncology

## 2022-12-13 ENCOUNTER — Telehealth: Payer: Self-pay | Admitting: Oncology

## 2022-12-13 ENCOUNTER — Other Ambulatory Visit: Payer: Self-pay | Admitting: Oncology

## 2022-12-13 VITALS — BP 128/86 | HR 87 | Temp 98.1°F | Resp 12 | Ht 60.0 in | Wt 108.2 lb

## 2022-12-13 DIAGNOSIS — C8302 Small cell B-cell lymphoma, intrathoracic lymph nodes: Secondary | ICD-10-CM

## 2022-12-13 NOTE — Telephone Encounter (Signed)
12/13/22 Left VM with next scheduled appts.

## 2022-12-16 DIAGNOSIS — H21341 Primary cyst of pars plana, right eye: Secondary | ICD-10-CM | POA: Diagnosis not present

## 2022-12-16 DIAGNOSIS — H53141 Visual discomfort, right eye: Secondary | ICD-10-CM | POA: Diagnosis not present

## 2022-12-16 DIAGNOSIS — H43391 Other vitreous opacities, right eye: Secondary | ICD-10-CM | POA: Diagnosis not present

## 2022-12-16 DIAGNOSIS — H43811 Vitreous degeneration, right eye: Secondary | ICD-10-CM | POA: Diagnosis not present

## 2022-12-24 DIAGNOSIS — N858 Other specified noninflammatory disorders of uterus: Secondary | ICD-10-CM | POA: Diagnosis not present

## 2022-12-24 DIAGNOSIS — N950 Postmenopausal bleeding: Secondary | ICD-10-CM | POA: Diagnosis not present

## 2022-12-25 ENCOUNTER — Encounter: Payer: Self-pay | Admitting: Oncology

## 2022-12-31 DIAGNOSIS — H53141 Visual discomfort, right eye: Secondary | ICD-10-CM | POA: Diagnosis not present

## 2022-12-31 DIAGNOSIS — H43811 Vitreous degeneration, right eye: Secondary | ICD-10-CM | POA: Diagnosis not present

## 2022-12-31 DIAGNOSIS — H35363 Drusen (degenerative) of macula, bilateral: Secondary | ICD-10-CM | POA: Diagnosis not present

## 2022-12-31 DIAGNOSIS — H353131 Nonexudative age-related macular degeneration, bilateral, early dry stage: Secondary | ICD-10-CM | POA: Diagnosis not present

## 2022-12-31 DIAGNOSIS — H21341 Primary cyst of pars plana, right eye: Secondary | ICD-10-CM | POA: Diagnosis not present

## 2022-12-31 DIAGNOSIS — H43391 Other vitreous opacities, right eye: Secondary | ICD-10-CM | POA: Diagnosis not present

## 2023-01-20 DIAGNOSIS — R059 Cough, unspecified: Secondary | ICD-10-CM | POA: Diagnosis not present

## 2023-01-20 DIAGNOSIS — R630 Anorexia: Secondary | ICD-10-CM | POA: Diagnosis not present

## 2023-01-20 DIAGNOSIS — L509 Urticaria, unspecified: Secondary | ICD-10-CM | POA: Diagnosis not present

## 2023-01-21 DIAGNOSIS — R059 Cough, unspecified: Secondary | ICD-10-CM | POA: Diagnosis not present

## 2023-01-21 DIAGNOSIS — J40 Bronchitis, not specified as acute or chronic: Secondary | ICD-10-CM | POA: Diagnosis not present

## 2023-01-21 DIAGNOSIS — Z8701 Personal history of pneumonia (recurrent): Secondary | ICD-10-CM | POA: Diagnosis not present

## 2023-01-22 DIAGNOSIS — N952 Postmenopausal atrophic vaginitis: Secondary | ICD-10-CM | POA: Diagnosis not present

## 2023-01-24 ENCOUNTER — Encounter: Payer: Self-pay | Admitting: Oncology

## 2023-02-12 ENCOUNTER — Other Ambulatory Visit: Payer: BC Managed Care – PPO

## 2023-02-13 ENCOUNTER — Ambulatory Visit: Payer: BC Managed Care – PPO | Admitting: Oncology

## 2023-02-14 ENCOUNTER — Encounter: Payer: Self-pay | Admitting: Oncology

## 2023-02-17 ENCOUNTER — Encounter: Payer: Self-pay | Admitting: Internal Medicine

## 2023-02-17 ENCOUNTER — Ambulatory Visit: Payer: BC Managed Care – PPO | Admitting: Internal Medicine

## 2023-02-17 VITALS — BP 118/72 | HR 81 | Temp 97.9°F | Resp 16

## 2023-02-17 DIAGNOSIS — E611 Iron deficiency: Secondary | ICD-10-CM

## 2023-02-17 DIAGNOSIS — J3089 Other allergic rhinitis: Secondary | ICD-10-CM | POA: Diagnosis not present

## 2023-02-17 DIAGNOSIS — L501 Idiopathic urticaria: Secondary | ICD-10-CM | POA: Diagnosis not present

## 2023-02-17 DIAGNOSIS — C8302 Small cell B-cell lymphoma, intrathoracic lymph nodes: Secondary | ICD-10-CM

## 2023-02-17 DIAGNOSIS — E038 Other specified hypothyroidism: Secondary | ICD-10-CM

## 2023-02-17 MED ORDER — FAMOTIDINE 40 MG PO TABS: 40.0000 mg | ORAL_TABLET | Freq: Every day | ORAL | 5 refills | Status: DC

## 2023-02-17 MED ORDER — LORATADINE 10 MG PO TABS: 10.0000 mg | ORAL_TABLET | Freq: Every day | ORAL | 5 refills | Status: DC

## 2023-02-17 NOTE — Addendum Note (Signed)
Addended by: Modesto Charon on: 02/17/2023 09:45 AM   Modules accepted: Orders

## 2023-02-17 NOTE — Patient Instructions (Signed)
Chronic Idiopathic Urticaria: - this is defined as hives lasting more than 6 weeks without an identifiable trigger - hives can be from a number of different sources including infections, allergies, vibration, temperature, pressure among many others other possible causes - often an identifiable cause is not determined - some potential triggers include: stress, illness, NSAIDs, aspirin, hormonal changes - you do not have any red flag symptoms to make [place] concerned about secondary causes of hives, but we will screen for these for reassurance with: CBC w diff, CMP, tryptase, TSH, hive panel, alpha-gal panel, inflammatory markers, area 2 aeroallergans  - approximately 50% of patients with chronic hives can have some associated swelling of the face/lips/eyelids (this is not a cause for alarm and does not typically progress onto systemic allergic reactions)  Therapy Plan:  - continue claritin 10mg  daily and pepcid 20mg  daily  - can increase or decrease dosing depending on symptom control to a maximum dose of 4 tablets of antihistamine daily. Wait until hives free for at least one month prior to decreasing dose.   - if hives are still uncontrolled with the above regimen, please arrange an appointment for discussion of Xolair (omalizumab)- an injectable medication for hives  Follow up: 6 months   Thank you so much for letting me partake in your care today.  Don't hesitate to reach out if you have any additional concerns!  Ferol Luz, MD  [facility] [place] [place]

## 2023-02-17 NOTE — Progress Notes (Signed)
New Patient Note  RE: Dawn Prince MRN: [id number] DOB: Oct 04, 1960 Date of Office Visit: 02/17/2023  Consult requested by: Philemon Kingdom, MD Primary care provider: Philemon Kingdom, MD  Chief Complaint: Urticaria (Pt states she started having in December of last year after stopping taking sequel. And since she have been breakng out into random severe episodes of hives on her limbs. She was put on claritinand pepcid by PCP, and it seems to manage it until she stops it.)  History of Present Illness: I had the pleasure of seeing Maryjayne Beston for initial evaluation at the [facility] on 02/17/2023. She is a 62 y.o. female, who is referred here by Philemon Kingdom, MD for the evaluation of urticaria .  History obtained from patient, chart review.  Rash: started DEC 24 , occurring daily if she does not take her claritin and [facility] occurs in morning, resolves by lunch; occurs on trunk, arms, legs; doe shave dermatographism  Denies other systemic symptoms including no respiratory, gastrointestinal or cardiovascular distress. No  changes to personal care products, detergents, diet, etc Therapies tried: claritin and pepcid (started in February and march)  Potential triggers: unknonw  Does Not associate fever, joint pain, joint swelling,  She has had some weight loss since stopping seroquel in December and with PNA in June  Lesions resolve in hours   No  bruising on resolution. No  recent illness. Pictures reviewed consistent with urticaria   She was diagnosed with NH- lymphoma (2017) and hypothyroidism (2022) and known iron deficiency    Assessment and Plan: Cheslea is a 62 y.o. 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)  Other specified hypothyroidism  Iron deficiency   Plan: Patient Instructions  Chronic Idiopathic Urticaria: - this is defined as hives lasting more than 6 weeks without an identifiable trigger - hives can be from a number of different sources including infections, allergies, vibration, temperature, pressure among many others other possible causes - often an identifiable cause is not determined - some potential triggers include: stress, illness, NSAIDs, aspirin, hormonal changes - you do not have any red flag symptoms to make [place] concerned about secondary causes of hives, but we will screen for these for reassurance with: CBC w diff, CMP, tryptase, TSH, hive panel, alpha-gal panel, inflammatory markers, area 2 aeroallergans  - approximately 50% of patients with chronic hives can have some associated swelling of the face/lips/eyelids (this is not a cause for alarm and does not typically progress onto systemic allergic reactions)  Therapy Plan:  - continue claritin 10mg  daily and pepcid 20mg  daily  - can increase or decrease dosing depending on symptom control to a maximum dose of 4 tablets of antihistamine daily. Wait until hives free for at least one month prior to decreasing dose.   - if hives are still uncontrolled with the above regimen, please arrange an appointment for discussion of Xolair (omalizumab)- an injectable medication for hives  Follow up: 6 months   Thank you so much for letting me partake in your care today.  Don't hesitate to reach out if you have any additional concerns!  Ferol Luz, MD  [facility] [place] [place]       No orders of the defined types were placed in this  encounter.  Lab Orders         Chronic Urticaria         Sed Rate (ESR)         CMP14+EGFR         CBC with Differential         Alpha-Gal Panel         Tryptase         Thyroid Panel With TSH         Thyroid Peroxidase Antibody          Allergens, Zone 2      Other allergy screening: Asthma: no Rhino conjunctivitis: yes: will having sneezing, rhinnorhea, in spring and fall; no animal triggers; usually treats with as needed claritin  Food allergy: no Medication allergy:  voltaren - stroke  Hymenoptera allergy: no Urticaria: yes Eczema:no History of recurrent infections suggestive of immunodeficency: no  Diagnostics: Skin Testing: Deferred due to recent antihistamines use.    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   GERD (gastroesophageal reflux disease) 06/20/2020   Hypothyroidism 06/20/2020   Sciatica 06/20/2020   Osteopenia 06/20/2020   Vitamin D deficiency 06/20/2020   Hyperlipidemia 06/20/2020   H/O degenerative disc disease 06/20/2020   Chronic lymphocytic leukemia (CLL), B-cell (HCC) 10/31/2015    Class: Chronic   Past Medical History:  Diagnosis Date   Allergy    seasonal   Arthritis    Bipolar 1 disorder (HCC)    Cancer (HCC)    non hodgkins lymphoma   Depression    GERD (gastroesophageal reflux disease)    History of degenerative disc disease    Hyperlipidemia    Increased risk of breast cancer 04/25/2021   Obstructive sleep apnea    Osteopenia    Scoliosis    Thyroid disease    hypothyroidism   Urticaria    Vitamin D deficiency    Past Surgical History: Past Surgical History:  Procedure Laterality Date   BREAST BIOPSY     BUBBLE STUDY  02/13/2021   Procedure: BUBBLE STUDY;  Surgeon: Chilton Si, MD;  Location[facility] ENDOSCOPY;  Service: Cardiovascular;;   CARPAL TUNNEL RELEASE Bilateral 2002    CESAREAN SECTION     x2   COLONOSCOPY  07/26/2008   Melanosis coli. Small internal hemorrhoids.    ENDOSCOPIC PLANTAR FASCIOTOMY     ESOPHAGOGASTRODUODENOSCOPY  03/17/2013   Mild gastritis. Status post esophageal dilatation.   LYMPH NODE BIOPSY     right hip repair torn tendon Right 09/2021   TEE WITHOUT CARDIOVERSION N/A 02/13/2021   Procedure: TRANSESOPHAGEAL ECHOCARDIOGRAM (TEE);  Surgeon: Chilton Si, MD;  Location: [facility] ENDOSCOPY;  Service: Cardiovascular;  Laterality: N/A;   WISDOM TOOTH EXTRACTION     Medication List:  Current Outpatient Medications  Medication Sig Dispense Refill   Calcium Carb-Cholecalciferol (CALCIUM 600 + D PO) Take by mouth. Twice daily     Cholecalciferol (VITAMIN D) 50 MCG (2000 UT) CAPS Take by mouth.     estradiol (ESTRACE) 0.1 MG/GM vaginal cream APPLY 0.5 GRAMS VAGINALLY EVERY DAY AT BEDTIME FOR 2 WEEKS, THEN 3 TIMES WEEKLY     famotidine (PEPCID)  40 MG tablet Take 40 mg by mouth daily.     lansoprazole (PREVACID) 30 MG capsule      loratadine (CLARITIN) 10 MG tablet take 1 tablet Orally Once a day 30 days     Multiple Vitamin (MULTIVITAMIN ADULT PO) Take by mouth. Vision MD once a day     Multiple Vitamins-Minerals (WOMENS MULTIVITAMIN PO) Take by mouth at bedtime.     ondansetron (ZOFRAN) 4 MG tablet Take 1 tablet (4 mg total) by mouth every 8 (eight) hours as needed for nausea or vomiting. 20 tablet 1   Probiotic Product (PROBIOTIC BLEND PO) Take by mouth at bedtime.     QUEtiapine (SEROQUEL) 200 MG tablet Take 1 tablet by mouth at bedtime.     ramipril (ALTACE) 2.5 MG capsule Take by mouth.     traZODone (DESYREL) 50 MG tablet take 1 tablet at bedtime Orally Once a day As needed for trouble sleeping 30 days     No current facility-administered medications for this visit.   Allergies: Allergies  Allergen Reactions   Diclofenac Sodium     Thought it had something to do with her having a stroke   Voltaren [Diclofenac Sodium]     Thought  it had something to do with her having a stroke   Nsaids     Had a stroke and worries it was related to the Diclofenac she took.   Social History: Social History   Socioeconomic History   Marital status: Married    Spouse name: Not on file   Number of children: 2   Years of education: Not on file   Highest education level: Associate degree: academic program  Occupational History   Not on file  Tobacco Use   Smoking status: Never   Smokeless tobacco: Never  Vaping Use   Vaping status: Never Used  Substance and Sexual Activity   Alcohol use: Not Currently   Drug use: Never   Sexual activity: Not on file  Other Topics Concern   Not on file  Social History Narrative   Right handed    1-2 cups of coffee per week   Lives at home with husband    Social Determinants of Health   Financial Resource Strain: Not on file  Food Insecurity: Not on file  Transportation Needs: Not on file  Physical Activity: Not on file  Stress: Not on file  Social Connections: Unknown (10/09/2021)   [facility]   Social Network    Social Network: Not on file   Lives in a Single-family home that is built 66 years ago.  No roaches in the house and that is to get off the floor.  No dust mite precautions.  Not exposed to fumes, chemicals or dust.  No HEPA filter in the home and home is not near an interstate industrial area. Smoking: No exposure Occupation: Retired  Landscape architect HistorySurveyor, minerals in the house: no Engineer, civil (consulting) in the family room: yes Carpet in the bedroom: no Heating: gas Cooling: central Pet: no  Family History: Family History  Problem Relation Age of Onset   Prostate cancer Father 2   Melanoma Father 18   High blood pressure Father    Breast cancer Maternal Aunt    Multiple myeloma Maternal Aunt    Breast cancer Maternal Aunt    Colon cancer Neg Hx    Colon polyps Neg Hx    Esophageal cancer Neg Hx    Rectal cancer Neg Hx  Stomach cancer Neg Hx      ROS: All others negative except as noted per HPI.   Objective: BP 118/72   Pulse 81   Temp 97.9 F (36.6 C) (Temporal)   Resp 16   SpO2 98%  There is no height or weight on file to calculate BMI.  General Appearance:  Alert, cooperative, no distress, appears stated age  Head:  Normocephalic, without obvious abnormality, atraumatic  Eyes:  Conjunctiva clear, EOM's intact  Nose: Nares normal, normal mucosa, no visible anterior polyps, and septum midline  Throat: Lips, tongue normal; teeth and gums normal, normal posterior oropharynx  Neck: Supple, symmetrical  Lungs:   clear to auscultation bilaterally, Respirations unlabored, no coughing  Heart:  regular rate and rhythm and no murmur, Appears well perfused  Extremities: No edema  Skin: Skin color, texture, turgor normal,  erythematous raised macules on right thigh   Neurologic: No gross deficits   The plan was reviewed with the patient/family, and all questions/concerned were addressed.  It was my pleasure to see Dawn Prince today and participate in her care. Please feel free to contact me with any questions or concerns.  Sincerely,  Ferol Luz, MD Allergy & Immunology  [facility] office: [phone] [facility] office: [phone]

## 2023-02-20 LAB — THYROID PANEL WITH TSH
Free Thyroxine Index: 1.7 (ref 1.2–4.9)
T3 Uptake Ratio: 25 % (ref 24–39)
T4, Total: 6.9 ug/dL (ref 4.5–12.0)
TSH: 3.35 u[IU]/mL (ref 0.450–4.500)

## 2023-02-20 LAB — CMP14+EGFR
ALT: 15 IU/L (ref 0–32)
AST: 23 IU/L (ref 0–40)
Albumin: 4.1 g/dL (ref 3.9–4.9)
Alkaline Phosphatase: 59 IU/L (ref 44–121)
BUN/Creatinine Ratio: 14 (ref 12–28)
BUN: 10 mg/dL (ref 8–27)
Bilirubin Total: <0.2 mg/dL (ref 0.0–1.2)
CO2: 26 mmol/L (ref 20–29)
Calcium: 9.9 mg/dL (ref 8.7–10.3)
Chloride: 102 mmol/L (ref 96–106)
Creatinine, Ser: 0.71 mg/dL (ref 0.57–1.00)
Globulin, Total: 2.7 g/dL (ref 1.5–4.5)
Glucose: 95 mg/dL (ref 70–99)
Potassium: 5 mmol/L (ref 3.5–5.2)
Sodium: 142 mmol/L (ref 134–144)
Total Protein: 6.8 g/dL (ref 6.0–8.5)
eGFR: 95 mL/min/{1.73_m2} (ref >59)

## 2023-02-20 LAB — CBC WITH DIFFERENTIAL/PLATELET
Basophils Absolute: 0 10*3/uL (ref 0.0–0.2)
Basos: 1 %
EOS (ABSOLUTE): 0.1 10*3/uL (ref 0.0–0.4)
Eos: 1 %
Hematocrit: 40.4 % (ref 34.0–46.6)
Hemoglobin: 13 g/dL (ref 11.1–15.9)
Immature Grans (Abs): 0 10*3/uL (ref 0.0–0.1)
Immature Granulocytes: 0 %
Lymphocytes Absolute: 3.1 10*3/uL (ref 0.7–3.1)
Lymphs: 58 %
MCH: 33.6 pg — ABNORMAL HIGH (ref 26.6–33.0)
MCHC: 32.2 g/dL (ref 31.5–35.7)
MCV: 104 fL — ABNORMAL HIGH (ref 79–97)
Monocytes Absolute: 0.2 10*3/uL (ref 0.1–0.9)
Monocytes: 3 %
Neutrophils Absolute: 2 10*3/uL (ref 1.4–7.0)
Neutrophils: 37 %
Platelets: 210 10*3/uL (ref 150–450)
RBC: 3.87 x10E6/uL (ref 3.77–5.28)
RDW: 13.7 % (ref 11.7–15.4)
WBC: 5.4 10*3/uL (ref 3.4–10.8)

## 2023-02-20 LAB — ALLERGENS, ZONE 2

## 2023-02-20 LAB — SEDIMENTATION RATE: Sed Rate: 33 mm/hr (ref 0–40)

## 2023-02-20 LAB — TRYPTASE: Tryptase: 4.8 ug/L (ref 2.2–13.2)

## 2023-02-20 LAB — ALPHA-GAL PANEL

## 2023-02-20 LAB — THYROID PEROXIDASE ANTIBODY: Thyroperoxidase Ab SerPl-aCnc: <9 IU/mL (ref 0–34)

## 2023-02-20 LAB — CHRONIC URTICARIA: cu index: 7 (ref <10)

## 2023-02-25 ENCOUNTER — Other Ambulatory Visit: Payer: Self-pay | Admitting: Oncology

## 2023-02-25 DIAGNOSIS — C8302 Small cell B-cell lymphoma, intrathoracic lymph nodes: Secondary | ICD-10-CM

## 2023-02-26 ENCOUNTER — Inpatient Hospital Stay: Payer: BC Managed Care – PPO | Attending: Oncology

## 2023-02-26 DIAGNOSIS — C8302 Small cell B-cell lymphoma, intrathoracic lymph nodes: Secondary | ICD-10-CM

## 2023-02-26 DIAGNOSIS — C9110 Chronic lymphocytic leukemia of B-cell type not having achieved remission: Secondary | ICD-10-CM | POA: Insufficient documentation

## 2023-02-26 DIAGNOSIS — Z8572 Personal history of non-Hodgkin lymphomas: Secondary | ICD-10-CM | POA: Diagnosis not present

## 2023-02-26 DIAGNOSIS — D519 Vitamin B12 deficiency anemia, unspecified: Secondary | ICD-10-CM | POA: Diagnosis not present

## 2023-02-26 LAB — CBC WITH DIFFERENTIAL (CANCER CENTER ONLY)
Abs Immature Granulocytes: 0.02 10*3/uL (ref 0.00–0.07)
Basophils Absolute: 0 10*3/uL (ref 0.0–0.1)
Basophils Relative: 0 %
Eosinophils Absolute: 0.1 10*3/uL (ref 0.0–0.5)
Eosinophils Relative: 1 %
HCT: 39.9 % (ref 36.0–46.0)
Hemoglobin: 12.7 g/dL (ref 12.0–15.0)
Immature Granulocytes: 0 %
Lymphocytes Relative: 57 %
Lymphs Abs: 3.4 10*3/uL (ref 0.7–4.0)
MCH: 33.9 pg (ref 26.0–34.0)
MCHC: 31.8 g/dL (ref 30.0–36.0)
MCV: 106.4 fL — ABNORMAL HIGH (ref 80.0–100.0)
Monocytes Absolute: 0.2 10*3/uL (ref 0.1–1.0)
Monocytes Relative: 3 %
Neutro Abs: 2.3 10*3/uL (ref 1.7–7.7)
Neutrophils Relative %: 39 %
Platelet Count: 197 10*3/uL (ref 150–400)
RBC: 3.75 MIL/uL — ABNORMAL LOW (ref 3.87–5.11)
RDW: 14.6 % (ref 11.5–15.5)
WBC Count: 6 10*3/uL (ref 4.0–10.5)
nRBC: 0 % (ref 0.0–0.2)

## 2023-02-26 LAB — CMP (CANCER CENTER ONLY)
ALT: 20 U/L (ref 0–44)
AST: 22 U/L (ref 15–41)
Albumin: 3.8 g/dL (ref 3.5–5.0)
Alkaline Phosphatase: 49 U/L (ref 38–126)
Anion gap: 8 (ref 5–15)
BUN: 16 mg/dL (ref 8–23)
CO2: 27 mmol/L (ref 22–32)
Calcium: 9.1 mg/dL (ref 8.9–10.3)
Chloride: 104 mmol/L (ref 98–111)
Creatinine: 0.78 mg/dL (ref 0.44–1.00)
GFR, Estimated: >60 mL/min (ref >60)
Glucose, Bld: 113 mg/dL — ABNORMAL HIGH (ref 70–99)
Potassium: 4.4 mmol/L (ref 3.5–5.1)
Sodium: 139 mmol/L (ref 135–145)
Total Bilirubin: 0.2 mg/dL — ABNORMAL LOW (ref 0.3–1.2)
Total Protein: 7.3 g/dL (ref 6.5–8.1)

## 2023-02-27 IMAGING — MR MR HEAD W/O CM
8 of 9 series · 38 of 48 positions shown · non-contrast
Comparison: 02/09/2021

CLINICAL DATA: Neuro deficit, acute, stroke suspected. Left-sided
numbness. Headache.

EXAM:
MRI HEAD WITHOUT CONTRAST
TECHNIQUE: Multiplanar, multiecho pulse sequences of the brain and surrounding
structures were obtained without intravenous contrast.

[Series 3: DWI · axial · 3.0mm · 1.09mm/px · z∈[-50,+106]mm · 9 of 106 slices shown (1 of 4)]
[im 1/106]
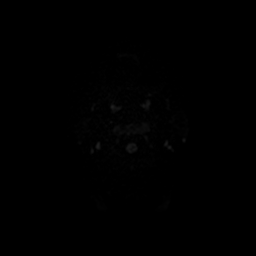
[im 20/106]
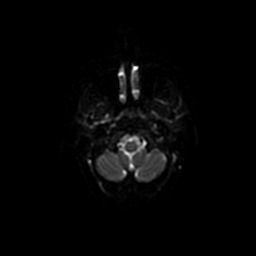
[im 29/106]
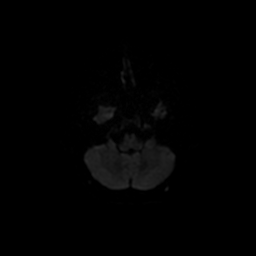
[im 48/106]
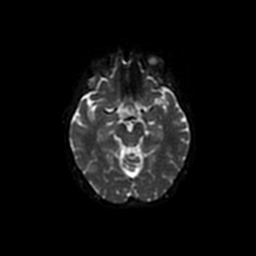
[im 58/106]
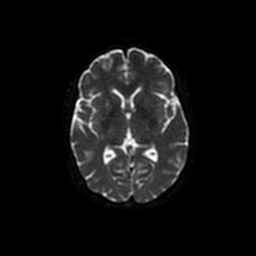
[im 77/106]
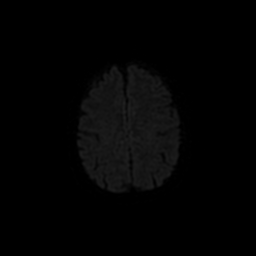
[im 86/106]
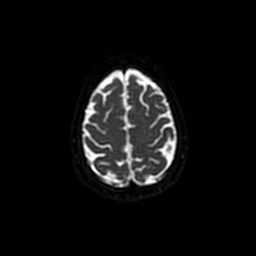
[im 96/106]
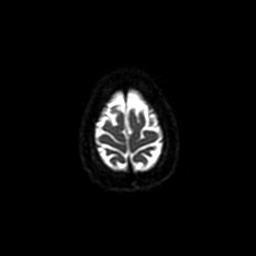
[im 106/106]
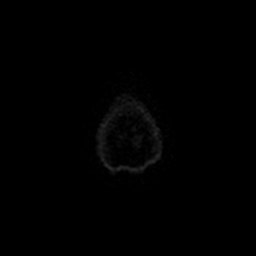

[Series 4: DWI · coronal · 5.0mm · 1.09mm/px · 9 of 76 slices shown (2 of 4)]
[im 1/76]
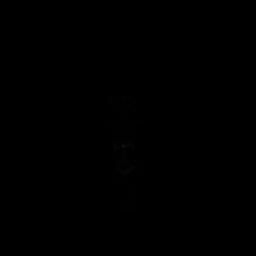
[im 10/76]
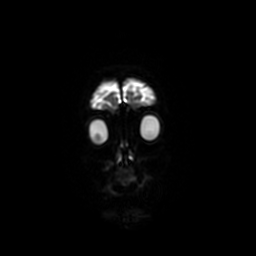
[im 19/76]
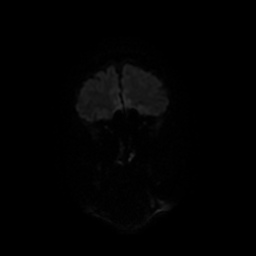
[im 29/76]
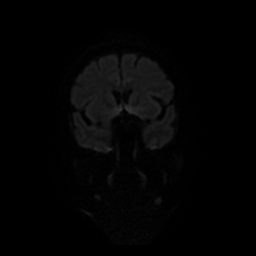
[im 38/76]
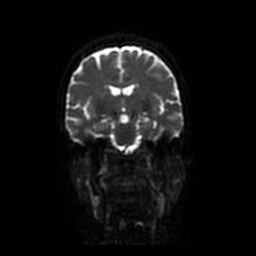
[im 47/76]
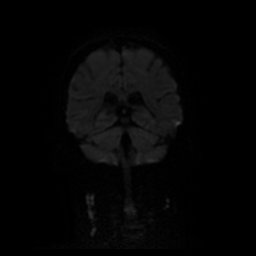
[im 57/76]
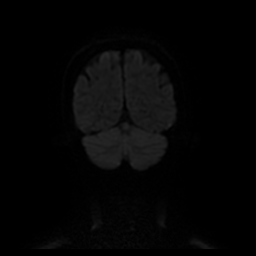
[im 66/76]
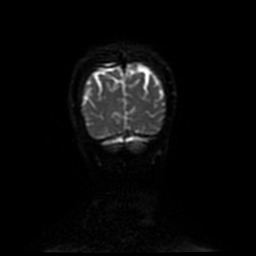
[im 76/76]
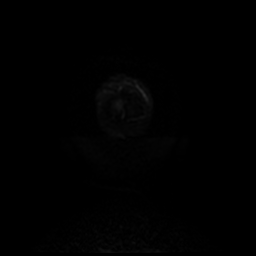

[Series 5: T1 · sagittal · 5.0mm · 0.47mm/px · 2 of 23 slices shown (1 of 2)]
[im 1/23]
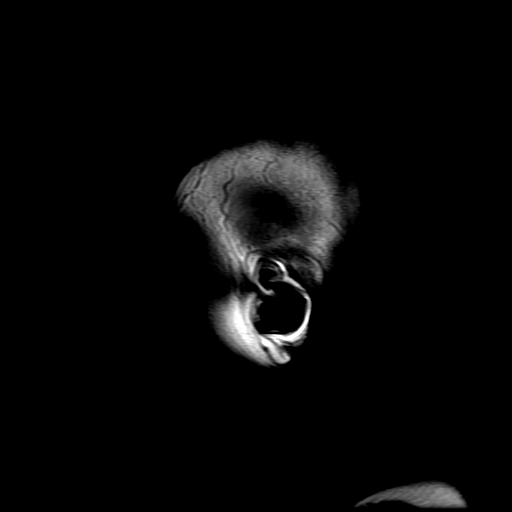
[im 23/23]
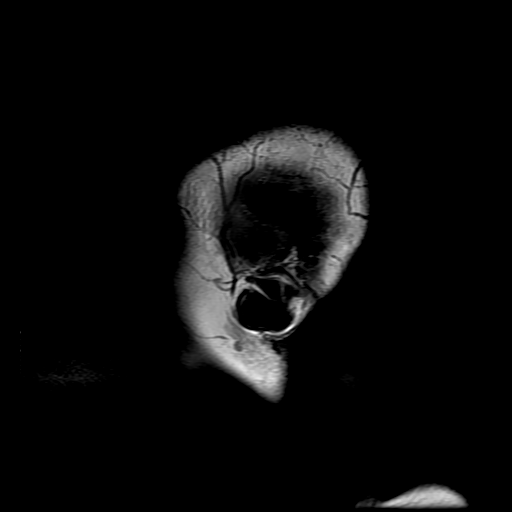

[Series 6: T2 · axial · 5.0mm · 0.43mm/px · z∈[-37,+101]mm · 2 of 24 slices shown]
[im 1/24]
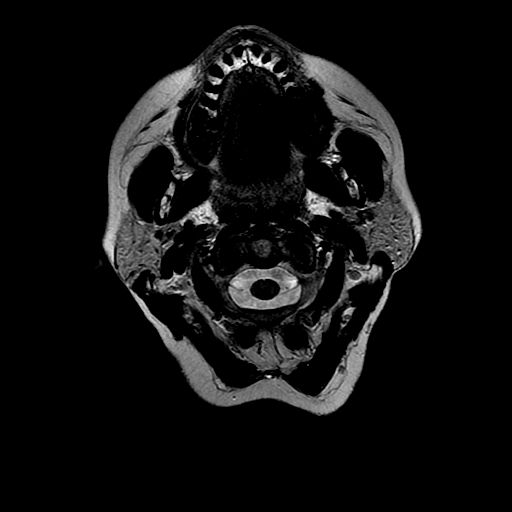
[im 24/24]
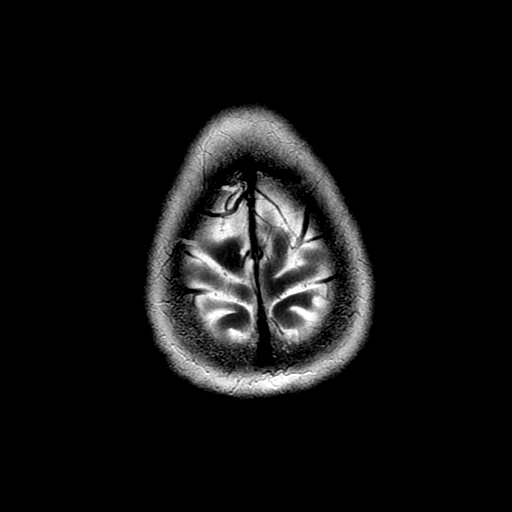

[Series 7: FLAIR · axial · 3.0mm · 0.43mm/px · z∈[-37,+101]mm · 2 of 24 slices shown]
[im 1/24]
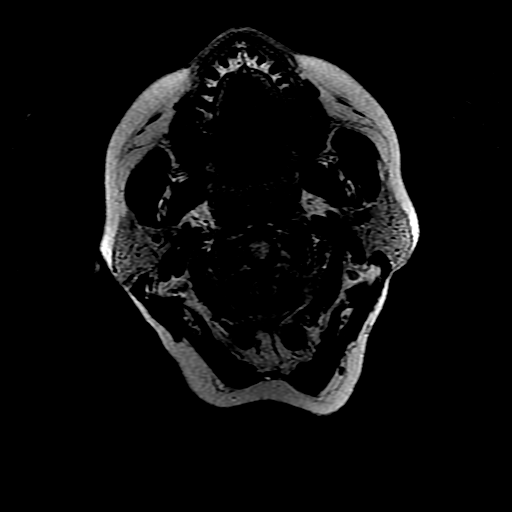
[im 24/24]
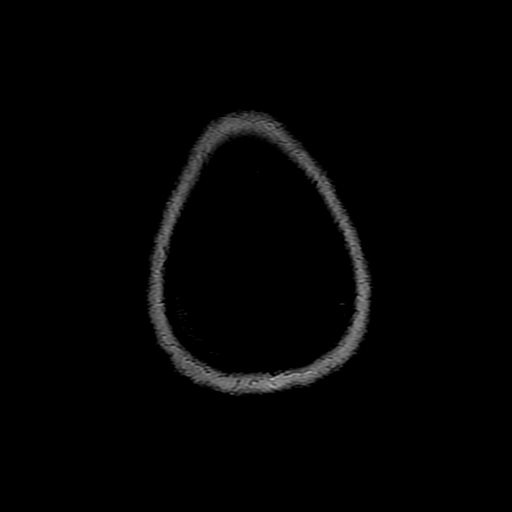

[Series 9: T1 · axial · 3.0mm · 0.43mm/px · z∈[-41,+41]mm · 5 of 100 slices shown (2 of 2)]
[im 1/100]
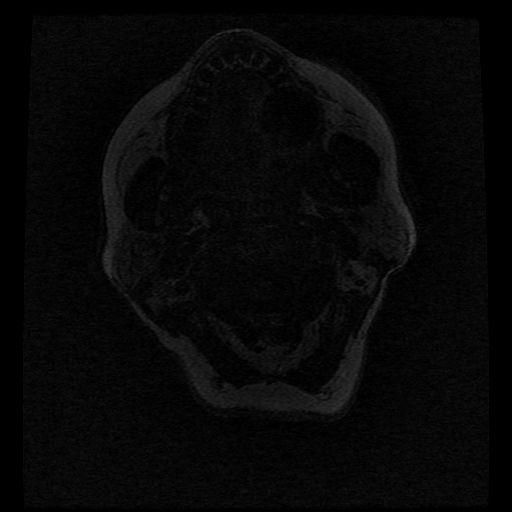
[im 12/100]
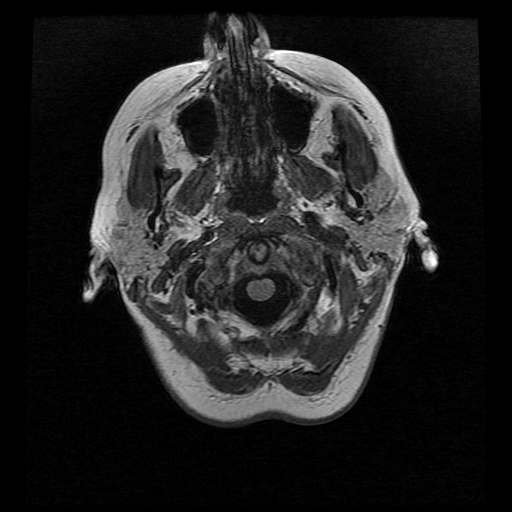
[im 34/100]
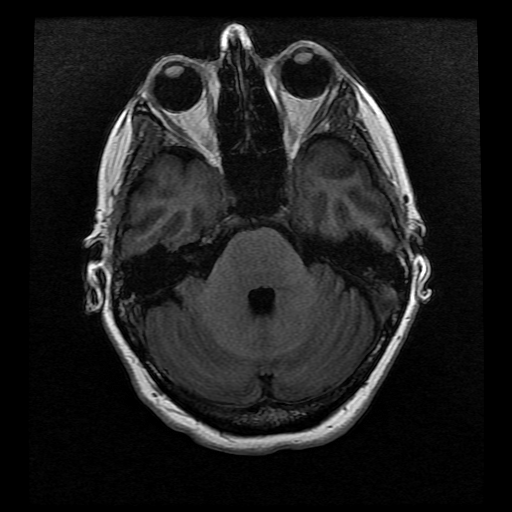
[im 45/100]
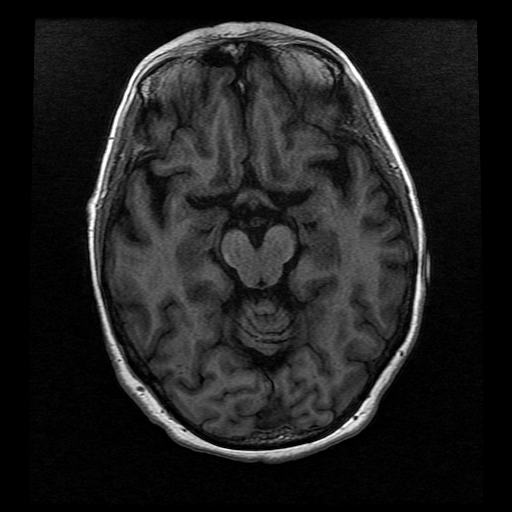
[im 56/100]
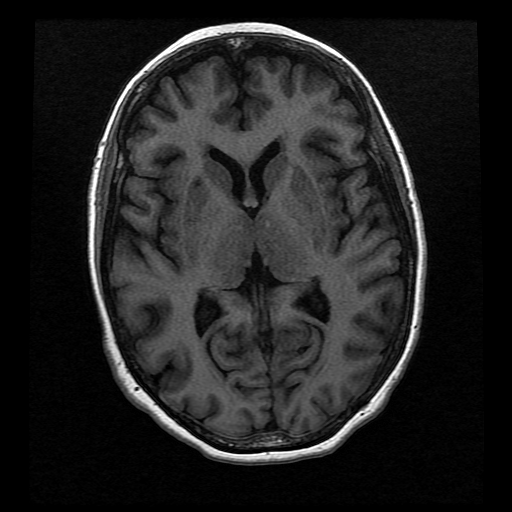

[Series 300: DWI · axial · 3.0mm · 1.09mm/px · z∈[-50,+106]mm · 5 of 53 slices shown (3 of 4)]
[im 1/53]
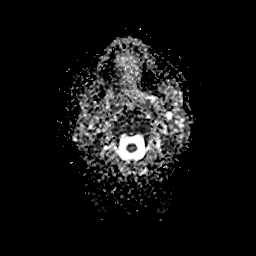
[im 14/53]
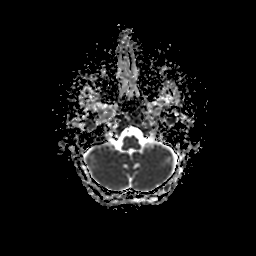
[im 27/53]
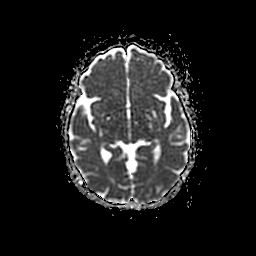
[im 40/53]
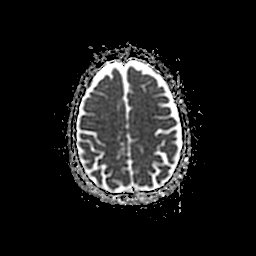
[im 53/53]
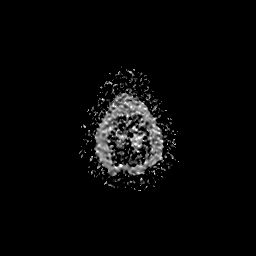

[Series 400: DWI · coronal · 5.0mm · 1.09mm/px · 4 of 37 slices shown (4 of 4)]
[im 1/37]
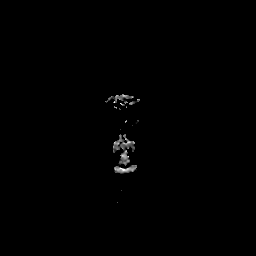
[im 13/37]
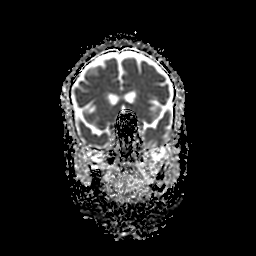
[im 25/37]
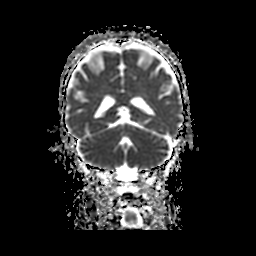
[im 37/37]
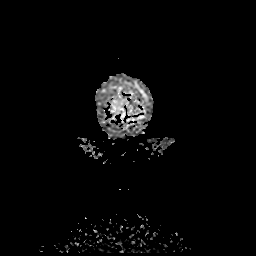

[38 of 48 positions shown; findings below may reference images not displayed]

FINDINGS: Brain: Diffusion imaging does not show any acute or subacute
infarction. Brainstem and cerebellum are normal. Cerebral
hemispheres show mild chronic small-vessel ischemic change of the
white matter. Old small vessel infarction of the right
posterolateral thalamus. No large vessel or cortical infarction. No
mass lesion, hemorrhage, hydrocephalus or extra-axial collection.

Vascular: Major vessels at the base of the brain show flow.

Skull and upper cervical spine: Negative

Sinuses/Orbits: Clear except for a small retention cyst in the
sphenoid sinus. Orbits negative.

Other: None
IMPRESSION: No acute finding to explain the clinical presentation. Mild chronic
small-vessel ischemic change of the cerebral hemispheric white
matter, similar to the prior examinations.

## 2023-03-04 DIAGNOSIS — Z23 Encounter for immunization: Secondary | ICD-10-CM | POA: Diagnosis not present

## 2023-03-04 DIAGNOSIS — H35363 Drusen (degenerative) of macula, bilateral: Secondary | ICD-10-CM | POA: Diagnosis not present

## 2023-03-04 DIAGNOSIS — H43391 Other vitreous opacities, right eye: Secondary | ICD-10-CM | POA: Diagnosis not present

## 2023-03-04 DIAGNOSIS — H43811 Vitreous degeneration, right eye: Secondary | ICD-10-CM | POA: Diagnosis not present

## 2023-03-04 DIAGNOSIS — H53141 Visual discomfort, right eye: Secondary | ICD-10-CM | POA: Diagnosis not present

## 2023-03-04 DIAGNOSIS — H21341 Primary cyst of pars plana, right eye: Secondary | ICD-10-CM | POA: Diagnosis not present

## 2023-03-04 DIAGNOSIS — H353131 Nonexudative age-related macular degeneration, bilateral, early dry stage: Secondary | ICD-10-CM | POA: Diagnosis not present

## 2023-03-05 NOTE — Progress Notes (Signed)
[facility]  [address] 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  She has been on observation only. She has had stable bilateral cervical and inguinal lymphadenopathy.  MRI imaging from February 2023 revealed extensive inguinal and retroperitoneal lymphadenopathy.  Her physical exam reveals a modest change in her cervical and supraclavicular adenopathy. PET scan in March revealed continued stability of mild adenopathy in the neck, chest, abdomen and pelvis with low level FDG and Deauville 2-3 category uptake. The latest CT in September of 2023 shows the numerous mildly enlarged nodes to be unchanged. Her lymphadenopathy is relatively stable at this time but just mildly increased. Her CT chest, abdomen and pelvis done on 11/06/22 revealed stable bilateral axillary and supraclavicular adenopathy, no mediastinal lymphadenopathy, a new band of linear consolidation in the LEFT upper lobe, which is probably post infectious consolidation. Lymphoma is not excluded but not favored.  She has stable retroperitoneal and proximal iliac lymphadenopathy, and no new adenopathy in the abdomen and pelvis.  There is a normal spleen and no skeletal metastasis. She has just mild progression of her lymphadenopathy by exam.    B12 deficiency anemia She was treated with B12 injections then transitioned to oral B12.  She is no longer taking B12 as her B12 level has been elevated at the [facility] office.  On May 11, B12 was greater than 2000, iron studies and folate were normal.  She remains off B12 as her last level in June was over 1,000. Now it has dropped to 350 and so I had advised her to get back on oral B-12 and we will recheck her level  later.  Pneumonia Left upper lobe, lobar pneumonia in May 2024, has been treated with multiple antibiotics and is clinically improving. CT scan shows resolution with a band of linear consolidation in the l;eft upper lobe.   Plan  She informed me that when she stopped taking of Claritin and Pepcid, the hives recurred and went to see the dermatologist. She was tested for allergens, alpha-gal, and tryptase. Her dermatologist feels as this is a autoimmune reaction due to her lymphoma. Once she got back on the medications the hives are back under control, so she has H2 and H1 histamine blockade. She has a WBC of 6.0, hemoglobin of 12.7, platelet count of 197,000, and her MCV continues to increase from 104 to 106.4 as of 02/26/2023. Her CMP is normal.  I will schedule a screening bilateral mammogram on 06/20/2023. I will see her back the following Friday with CBC, CMP, LDH, B-12. The patient and her husband understand the plans discussed today and are in agreement with them.  She knows to contact our office if she develops concerns prior to her next appointment.  I provided 14 minutes of face-to-face time during this encounter and > 50% was spent counseling as documented under my assessment and plan.    Dellia Beckwith, MD  [facility] [address] [place] [address] Dept: [phone] Dept Fax: [phone]   No orders of the defined types were placed in this encounter.   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  Staging CT chest revealed adenopathy of the bilateral neck measuring up to 18 mm in diameter with left subclavian, bilateral axillary and subpectoral adenopathy, but no mediastinal nodes.  CT abdomen was  negative.  CT  pelvis revealed bilateral external iliac nodes up to 11 mm in diameter.  She had B symptoms with severe night sweats and weight loss.  She has been on  observation only.  Due to her family history of breast cancer, she underwent testing for hereditary breast and ovarian cancer with the My[employer] myRisk Hereditary Cancer Gene panel test.  This did not reveal any clinically significant mutation.  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  Her [facility] breast cancer risk assessment showed her lifetime risk of breast cancer to be 29.7%, so annual breast MRI, in addition to mammogram is recommended.  Mammogram and MRI breast done in August 2019 did not reveal any evidence of malignancy.  She did go for a second opinion to [facility] regarding her lymphoma and they concurred with the approach of watchful waiting.    She presented to the emergency room in mid April 2021 due to increased urinary frequency and discomfort.  CT imaging revealed left sided obstructive uropathy with 3 mm calculus in the distal left ureter just proximal to the ureterovesical unction causing moderate hydroureteronephrosis and perinephric stranding.  Left greater than right iliac and pelvic lymphadenopathy, slightly greater than on 10/26/18, consistent with known history of lymphoma.  We did not repeat a scan in June as scheduled.  Annual screening bilateral mammogram from August 2021 was clear.  MRI breast has not been repeated due to her comorbidities.   CT neck in May 2022 revealed multiple lymph nodes in the neck bilaterally, with mild progression of lymph nodes on the right. There has been more significant progression of left level 4 and left supraclavicular lymph nodes compared to the prior study. Largest lymph node in the left supraclavicular region measures 35 x 17 mm. Findings were compatible with lymphoma. CT chest/abdomen/pelvis revealed no substantial interval change in exam. Bilateral supraclavicular,  subpectoral, axillary, retroperitoneal, and pelvic lymphadenopathy was similar to prior. There were no definite findings of progression.  At her visit in September, the lymphoma remained stable. She continued to have sciatic pain unrelieved with gabapentin 800 mg TID. She had steroid injections of the spine with improvement.    Bilateral screening mammogram in January revealed a possible asymmetry in the left breast.  She underwent left diagnostic mammogram and the asymmetry disperses with compression, so it is felt to be dense fibroglandular tissue.  There is no evidence of malignancy.  Bilateral screening in 1 year was recommended.  MRI of the right hip from February 2023 revealed extensive inguinal and retroperitoneal lymphadenopathy. There are high-grade tears of the right gluteus numbness greater than medius with background tendinosis and trochanteric bursitis, and bilateral hamstring origin tendinosis with mild partial tearing. She has mild bilateral hip osteoarthritis, and  right sided labral tear. She states that she has chronic palpable lymph nodes of the left neck and supraclavicular area.  She therefore underwent PET scan in March, which revealed stable disease when compared to previous CT images.   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)  11/11/2015 Cancer Staging   Staging form: Hodgkin and Non-Hodgkin Lymphoma, AJCC 8th Edition - Clinical stage from 11/11/2015: Stage III (Small lymphocytic  leukemia) - Signed by Dellia Beckwith, MD on 01/28/2021 Histopathologic type: Malignant lymphoma, small B lymphocytic, NOS (see also M-9823/3) Stage prefix: Initial diagnosis Diagnostic confirmation: Positive histology PLUS positive immunophenotyping and/or positive genetic studies Specimen type: Core Needle Biopsy Staged by: Managing physician Stage used in treatment planning: Yes National guidelines used in treatment planning: Yes Type of national guideline  used in treatment planning: NCCN Staging comments: Watchful waiting   06/20/2020 Initial Diagnosis   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)   Chronic lymphocytic leukemia (CLL), B-cell (HCC)  10/31/2015 Initial Diagnosis   Chronic lymphocytic leukemia (CLL), B-cell (HCC)   11/11/2015 Cancer Staging   Staging form: Chronic Lymphocytic Leukemia / Small Lymphocytic Lymphoma, AJCC 8th Edition - Clinical stage from 11/11/2015: Modified Rai Stage III (Modified Rai risk: High, Binet: Stage B, Lugano: Stage III, Lymphocytosis: Absent, Adenopathy: Present, Organomegaly: Absent, Anemia: Absent, Thrombocytopenia: Absent) - Signed by Dellia Beckwith, MD on 04/05/2021 Histopathologic type: B-cell lymphocytic leukemia/small lymphocytic lymphoma (see also M-9670/3) Stage prefix: Initial diagnosis Stage used in treatment planning: Yes National guidelines used in treatment planning: Yes Type of national guideline used in treatment planning: NCCN       INTERVAL HISTORY:  Natishia  is here today for repeat clinical assessment for small cell B cell lymphoma. She has never required treatment for this but she does have slow progression of her lymphadenopathy. CT chest, abdomen and pelvis done on 11/06/22 revealed stable bilateral axillary and supraclavicular adenopathy, no mediastinal lymphadenopathy, a new band of linear consolidation in the LEFT upper lobe, which is probably post infectious consolidation. Lymphoma is not excluded but not favored.  She has stable retroperitoneal and proximal iliac lymphadenopathy, and no new adenopathy in the abdomen and pelvis.  There is a normal spleen and no skeletal metastasis. Patient states that she feels well and has no complaints of pain. She is largely recovered from her prior pneumonia and respiratory distress. She informed me that when she stopped taking of Claritin and Pepcid, the hives recurred and went to see the dermatologist. She was tested  for allergens, alpha-gal, and tryptase. Her dermatologist feels as this is a autoimmune reaction due to her lymphoma. Once she got back on the medications the hives are back under control, so she has H2 and H1 histamine blockade. She has a WBC of 6.0, hemoglobin of 12.7, platelet count of 197,000, and her MCV continues to increase from 104 to 106.4 as of 02/26/2023. Her CMP is normal.  I will schedule a screening bilateral mammogram on 06/20/2023. I will see her back the following Friday with CBC, CMP, LDH, B-12.  She denies signs of infection such as sore throat, sinus drainage, cough, or urinary symptoms.  She denies fevers or recurrent chills. She denies pain. She denies nausea, vomiting, chest pain, dyspnea or cough. Her appetite is much better and her weight has increased 2 pounds over last 3 months .    REVIEW OF SYSTEMS:  Review of Systems  Constitutional:  Positive for fatigue. Negative for appetite change, chills, diaphoresis, fever and unexpected weight change.  HENT:  Negative.  Negative for hearing loss, lump/mass, mouth sores, nosebleeds, sore throat, tinnitus, trouble swallowing and voice change.   Eyes: Negative.  Negative for eye problems and icterus.  Respiratory:  Negative for chest tightness, cough, hemoptysis, shortness of breath and wheezing.   Cardiovascular:  Negative for chest pain, leg swelling and palpitations.  Gastrointestinal:  Negative for abdominal distention, abdominal pain, blood in stool,  constipation, diarrhea, nausea, rectal pain and vomiting.  Endocrine: Negative.   Genitourinary:  Negative for bladder incontinence, difficulty urinating, dyspareunia, dysuria, frequency, hematuria, menstrual problem, nocturia, pelvic pain, vaginal bleeding and vaginal discharge.   Musculoskeletal:  Positive for arthralgias (right shoulder improved but still hurts). Negative for back pain, flank pain, gait problem, myalgias, neck pain and neck stiffness.       Hip pain  Skin:  Negative.  Negative for itching, rash and wound.  Neurological:  Positive for headaches. Negative for dizziness, extremity weakness, gait problem, light-headedness, numbness, seizures and speech difficulty.  Hematological:  Negative for adenopathy. Bruises/bleeds easily.  Psychiatric/Behavioral:  Positive for sleep disturbance. Negative for confusion, decreased concentration, depression and suicidal ideas. The patient is not nervous/anxious.     VITALS:  Blood pressure (!) 141/86, pulse 78, temperature (!) 97.5 F (36.4 C), temperature source Oral, resp. rate 18, height 5' (1.524 m), weight 110 lb 14.4 oz (50.3 kg), SpO2 98%.  Wt Readings from Last 3 Encounters:  03/07/23 110 lb 14.4 oz (50.3 kg)  12/13/22 108 lb 3.2 oz (49.1 kg)  11/08/22 110 lb 1.6 oz (49.9 kg)    Body mass index is 21.66 kg/m.  Performance status (ECOG): 2 - Symptomatic, <50% confined to bed  PHYSICAL EXAM:  Physical Exam Vitals and nursing note reviewed. Exam conducted with a chaperone present.  Constitutional:      General: She is not in acute distress.    Appearance: Normal appearance. She is normal weight. She is not ill-appearing, toxic-appearing or diaphoretic.  HENT:     Head: Normocephalic and atraumatic.     Right Ear: Tympanic membrane, ear canal and external ear normal. There is no impacted cerumen.     Left Ear: Tympanic membrane, ear canal and external ear normal. There is no impacted cerumen.     Nose: Nose normal. No congestion or rhinorrhea.     Mouth/Throat:     Mouth: Mucous membranes are moist.     Pharynx: Oropharynx is clear. No oropharyngeal exudate or posterior oropharyngeal erythema.  Eyes:     General: No scleral icterus.       Right eye: No discharge.        Left eye: No discharge.     Extraocular Movements: Extraocular movements intact.     Conjunctiva/sclera: Conjunctivae normal.     Pupils: Pupils are equal, round, and reactive to light.  Neck:     Vascular: No carotid bruit.      Comments:   Cardiovascular:     Rate and Rhythm: Normal rate and regular rhythm.     Pulses: Normal pulses.     Heart sounds: Normal heart sounds. No murmur heard.    No friction rub. No gallop.  Pulmonary:     Effort: Pulmonary effort is normal. No respiratory distress.     Breath sounds: Normal breath sounds. No stridor. No wheezing, rhonchi or rales.  Chest:     Chest wall: No tenderness.  Abdominal:     General: Bowel sounds are normal. There is no distension.     Palpations: Abdomen is soft. There is no hepatomegaly, splenomegaly or mass.     Tenderness: There is no abdominal tenderness. There is no right CVA tenderness, left CVA tenderness, guarding or rebound.     Hernia: No hernia is present.  Musculoskeletal:        General: No swelling, tenderness, deformity or signs of injury. Normal range of motion.     Cervical back: Normal  range of motion and neck supple. No rigidity or tenderness.     Right lower leg: No edema.     Left lower leg: No edema.  Lymphadenopathy:     Cervical: Cervical adenopathy present.     Right cervical: Superficial cervical adenopathy and posterior cervical adenopathy present.     Left cervical: Posterior cervical adenopathy present.     Upper Body:     Right upper body: Supraclavicular adenopathy and axillary adenopathy present. No pectoral adenopathy.     Left upper body: Supraclavicular adenopathy and axillary adenopathy present. No pectoral adenopathy.     Lower Body: Right inguinal adenopathy present. Left inguinal adenopathy present.     Comments: 1 node that is 2.5cm long firm and rubbery. Above that  2 lymph nodes 1cm in diameter. Bilateral cervical nodes L>R up to 1.5cm Bilateral supraclavicular nodes multiple on the left side at 1.2cm 2 nodes in the left inguinal area measuring up to about 1cm 1 small node in the right inguinal area  Skin:    General: Skin is warm and dry.     Coloration: Skin is not jaundiced or pale.     Findings: No  bruising, erythema, lesion or rash.  Neurological:     General: No focal deficit present.     Mental Status: She is alert and oriented to person, place, and time. Mental status is at baseline.     Cranial Nerves: No cranial nerve deficit.     Sensory: No sensory deficit.     Motor: No weakness.     Coordination: Coordination normal.     Gait: Gait normal.     Deep Tendon Reflexes: Reflexes normal.  Psychiatric:        Mood and Affect: Mood normal.        Behavior: Behavior normal.        Thought Content: Thought content normal.        Judgment: Judgment normal.     LABS:      Latest Ref Rng & Units 02/26/2023    8:31 AM 02/17/2023    9:22 AM 12/12/2022    9:06 AM  CBC  WBC 4.0 - 10.5 K/uL 6.0  5.4  6.9   Hemoglobin 12.0 - 15.0 g/dL 09.8  11.9  14.7   Hematocrit 36.0 - 46.0 % 39.9  40.4  35.0   Platelets 150 - 400 K/uL 197  210  212       Latest Ref Rng & Units 02/26/2023    8:31 AM 02/17/2023    9:22 AM 12/12/2022    9:06 AM  CMP  Glucose 70 - 99 mg/dL 829  95  95   BUN 8 - 23 mg/dL 16  10  16    Creatinine 0.44 - 1.00 mg/dL 5.62  1.30  8.65   Sodium 135 - 145 mmol/L 139  142  136   Potassium 3.5 - 5.1 mmol/L 4.4  5.0  3.8   Chloride 98 - 111 mmol/L 104  102  102   CO2 22 - 32 mmol/L 27  26  26    Calcium 8.9 - 10.3 mg/dL 9.1  9.9  8.7   Total Protein 6.5 - 8.1 g/dL 7.3  6.8  6.9   Total Bilirubin 0.3 - 1.2 mg/dL 0.2  <7.8  0.5   Alkaline Phos 38 - 126 U/L 49  59  49   AST 15 - 41 U/L 22  23  20    ALT 0 - 44 U/L  20  15  16     Component Ref Range & Units 02/17/23  Thyroperoxidase Ab SerPl-aCnc 0 - 34 IU/mL <9     Component Ref Range & Units 2 wk ago (02/17/23) 4 mo ago (10/28/22) 4 mo ago (10/28/22)  TSH 0.450 - 4.500 uIU/mL 3.350  1.084 R, CM  T4, Total 4.5 - 12.0 ug/dL 6.9 7.4 CM   T3 Uptake Ratio 24 - 39 % 25    Free Thyroxine Index 1.2 - 4.9 1.7     Component Ref Range & Units 02/17/23  Tryptase 2.2 - 13.2 ug/L 4.8     No results found for: "CEA1",  "CEA" / No results found for: "CEA1", "CEA" No results found for: "PSA1" No results found for: "XBJ478" No results found for: "CAN125"  No results found for: "TOTALPROTELP", "ALBUMINELP", "A1GS", "A2GS", "BETS", "BETA2SER", "GAMS", "MSPIKE", "SPEI" Lab Results  Component Value Date   TIBC 322 12/12/2022   TIBC 331 07/23/2021   FERRITIN 19 12/12/2022   FERRITIN 22 07/23/2021   IRONPCTSAT 30 12/12/2022   IRONPCTSAT 20 07/23/2021   Lab Results  Component Value Date   LDH 136 10/30/2021   LDH 142 07/20/2021   LDH 147 04/26/2021    STUDIES:  EXAM: 11/06/2022 CT CHEST, ABDOMEN, AND PELVIS WITH CONTRAST CHEST IMPRESSION:  Stable bilateral axillary and supraclavicular lymphadenopathy. No mediastinal lymphadenopathy. New band of linear consolidation in the LEFT upper lobe. Probable post infectious consolidation. Lymphoma not excluded but not favored.  PELVIS IMPRESSION: Stable retroperitoneal and proximal iliac lymphadenopathy.  No new adenopathy in the abdomen and pelvis.  Normal spleen. No skeletal metastasis.       HISTORY:   Past Medical History:  Diagnosis Date   Allergy    seasonal   Arthritis    Bipolar 1 disorder (HCC)    Cancer (HCC)    non hodgkins lymphoma   Depression    GERD (gastroesophageal reflux disease)    History of degenerative disc disease    Hyperlipidemia    Increased risk of breast cancer 04/25/2021   Obstructive sleep apnea    Osteopenia    Scoliosis    Thyroid disease    hypothyroidism   Urticaria    Vitamin D deficiency     Past Surgical History:  Procedure Laterality Date   BREAST BIOPSY     BUBBLE STUDY  02/13/2021   Procedure: BUBBLE STUDY;  Surgeon: Chilton Si, MD;  Location: [facility] ENDOSCOPY;  Service: Cardiovascular;;   CARPAL TUNNEL RELEASE Bilateral 2002   CESAREAN SECTION     x2   COLONOSCOPY  07/26/2008   Melanosis coli. Small internal hemorrhoids.    ENDOSCOPIC PLANTAR FASCIOTOMY     ESOPHAGOGASTRODUODENOSCOPY   03/17/2013   Mild gastritis. Status post esophageal dilatation.   LYMPH NODE BIOPSY     right hip repair torn tendon Right 09/2021   TEE WITHOUT CARDIOVERSION N/A 02/13/2021   Procedure: TRANSESOPHAGEAL ECHOCARDIOGRAM (TEE);  Surgeon: Chilton Si, MD;  Location: [facility] ENDOSCOPY;  Service: Cardiovascular;  Laterality: N/A;   WISDOM TOOTH EXTRACTION      Family History  Problem Relation Age of Onset   Prostate cancer Father 52   Melanoma Father 2   High blood pressure Father    Breast cancer Maternal Aunt    Multiple myeloma Maternal Aunt    Breast cancer Maternal Aunt    Colon cancer Neg Hx    Colon polyps Neg Hx    Esophageal cancer Neg Hx    Rectal cancer Neg  Hx    Stomach cancer Neg Hx     Social History:  reports that she has never smoked. She has never used smokeless tobacco. She reports that she does not currently use alcohol. She reports that she does not use drugs.The patient is accompanied by her husband today.  Allergies:  Allergies  Allergen Reactions   Diclofenac Sodium     Thought it had something to do with her having a stroke   Voltaren [Diclofenac Sodium]     Thought it had something to do with her having a stroke   Nsaids     Had a stroke and worries it was related to the Diclofenac she took.    Current Medications: Current Outpatient Medications  Medication Sig Dispense Refill   Calcium Carb-Cholecalciferol (CALCIUM 600 + D PO) Take by mouth. Twice daily     Cholecalciferol (VITAMIN D) 50 MCG (2000 UT) CAPS Take by mouth.     estradiol (ESTRACE) 0.1 MG/GM vaginal cream APPLY 0.5 GRAMS VAGINALLY EVERY DAY AT BEDTIME FOR 2 WEEKS, THEN 3 TIMES WEEKLY     famotidine (PEPCID) 40 MG tablet Take 1 tablet (40 mg total) by mouth daily. 32 tablet 5   lansoprazole (PREVACID) 30 MG capsule      loratadine (CLARITIN) 10 MG tablet Take 1 tablet (10 mg total) by mouth daily. 32 tablet 5   Multiple Vitamin (MULTIVITAMIN ADULT PO) Take by mouth. Vision MD once a day      Multiple Vitamins-Minerals (WOMENS MULTIVITAMIN PO) Take by mouth at bedtime.     Probiotic Product (PROBIOTIC BLEND PO) Take by mouth at bedtime.     ramipril (ALTACE) 2.5 MG capsule Take by mouth.     No current facility-administered medications for this visit.    I,[name],acting as a scribe for Dellia Beckwith, MD.,have documented all relevant documentation on the behalf of Dellia Beckwith, MD,as directed by  Dellia Beckwith, MD while in the presence of Dellia Beckwith, MD.

## 2023-03-07 ENCOUNTER — Other Ambulatory Visit: Payer: Self-pay | Admitting: Oncology

## 2023-03-07 ENCOUNTER — Inpatient Hospital Stay (INDEPENDENT_AMBULATORY_CARE_PROVIDER_SITE_OTHER): Payer: BC Managed Care – PPO | Admitting: Oncology

## 2023-03-07 ENCOUNTER — Encounter: Payer: Self-pay | Admitting: Oncology

## 2023-03-07 VITALS — BP 141/86 | HR 78 | Temp 97.5°F | Resp 18 | Ht 60.0 in | Wt 110.9 lb

## 2023-03-07 DIAGNOSIS — C9110 Chronic lymphocytic leukemia of B-cell type not having achieved remission: Secondary | ICD-10-CM

## 2023-03-07 DIAGNOSIS — D519 Vitamin B12 deficiency anemia, unspecified: Secondary | ICD-10-CM | POA: Diagnosis not present

## 2023-03-07 DIAGNOSIS — C8302 Small cell B-cell lymphoma, intrathoracic lymph nodes: Secondary | ICD-10-CM | POA: Diagnosis not present

## 2023-03-07 DIAGNOSIS — Z1239 Encounter for other screening for malignant neoplasm of breast: Secondary | ICD-10-CM

## 2023-03-07 NOTE — Progress Notes (Signed)
I reviewed the bloodwork. , kidney function, liver function, electrolytes, thyroid, autoimmune screener, inflammation markers, chronic urticaria index (checks for autoantibodies that trigger mast cells), tryptase (checks for mast cell issues) and alpha gal (checks for red meat allergy) were all normal which is great.   Blood work did show and elevated MCV this can be a sign of a vitamin deficiency, recommend following up with PCP.   Environmental allergy bloodwork was negative as well.

## 2023-03-11 ENCOUNTER — Telehealth: Payer: Self-pay | Admitting: Oncology

## 2023-03-11 NOTE — Telephone Encounter (Signed)
Contacted pt to schedule an appt. Unable to reach via phone, voicemail was left.   Scheduling Message Entered by Gery Pray H on 03/07/2023 at  5:03 PM Priority: Routine <No visit type provided>  Department: [facility]  Provider:  Scheduling Notes:  RT 3-4 months with labs  and due for bilat screening mammogram 06/20/23, see me the following week

## 2023-03-13 NOTE — Telephone Encounter (Signed)
Patient has been scheduled. Aware of appt date and time.

## 2023-03-15 DIAGNOSIS — N390 Urinary tract infection, site not specified: Secondary | ICD-10-CM | POA: Diagnosis not present

## 2023-03-20 ENCOUNTER — Telehealth: Payer: Self-pay

## 2023-03-20 NOTE — Telephone Encounter (Signed)
Pt called in and said that she was having another rout break and the Claritin is not working mentioned to her trying zyrtec bid along with the Pepcid since she has been on Claritin so long. She said she will try it an give [place] an update.

## 2023-03-21 ENCOUNTER — Encounter: Payer: Self-pay | Admitting: Oncology

## 2023-04-04 DIAGNOSIS — M25552 Pain in left hip: Secondary | ICD-10-CM | POA: Diagnosis not present

## 2023-04-04 DIAGNOSIS — M7602 Gluteal tendinitis, left hip: Secondary | ICD-10-CM | POA: Diagnosis not present

## 2023-05-02 DIAGNOSIS — M7602 Gluteal tendinitis, left hip: Secondary | ICD-10-CM | POA: Diagnosis not present

## 2023-05-30 ENCOUNTER — Other Ambulatory Visit: Payer: Self-pay

## 2023-05-30 DIAGNOSIS — Z8673 Personal history of transient ischemic attack (TIA), and cerebral infarction without residual deficits: Secondary | ICD-10-CM

## 2023-06-02 ENCOUNTER — Encounter: Payer: Self-pay | Admitting: Oncology

## 2023-06-04 ENCOUNTER — Ambulatory Visit: Payer: Medicare Other

## 2023-06-04 DIAGNOSIS — Z8673 Personal history of transient ischemic attack (TIA), and cerebral infarction without residual deficits: Secondary | ICD-10-CM

## 2023-06-09 ENCOUNTER — Encounter: Payer: Self-pay | Admitting: Oncology

## 2023-06-09 ENCOUNTER — Ambulatory Visit: Payer: Medicare Other | Attending: Internal Medicine

## 2023-06-11 ENCOUNTER — Telehealth: Payer: Self-pay | Admitting: Adult Health

## 2023-06-11 NOTE — Telephone Encounter (Signed)
 Spoke with Dawn Prince and considering that this is new symptoms and potential for new stroke and they have not had hospital work up, they would need to come in and see Dr Janett Medin. The advised phone staff a MRI was completed, I don't have access to those images and the MD would need to be able to view the MRI. The husband states they have the MRI disc.  Dr Janett Medin is out of the office this week since he is in hospital and will not be back until next week. The first available at this time I see would be March 19 at 8 am with check in of 7:30 am. We can inform the pt and husband that we will write her name down and keep an eye out for cancellation as well as place on office wait list.  It would be helpful if they can bring the MRI disc by sometime next week and I can go ahead and have Dr Janett Medin review and compare to previous imaging. It would also be beneficial if the PCP will send us  their notes for us  to review as well.  Pt should go to ER if she should develop any further stroke like symptoms and that way she can complete further stroke work up.

## 2023-06-11 NOTE — Telephone Encounter (Signed)
 Contacted the patient informed of message and scheduled appointment on 08/13/23 at 8:00 am Also informed patient if you have any stoke like symptoms need to go the ED. Patient verbalized understand.

## 2023-06-11 NOTE — Telephone Encounter (Signed)
 Pt's husband, Ceil Blauer, Monday, January 30 during night and had slurred speech, tuck tongue went to the left and trouble forming words. Had an appt with PCP on 05/29/23. PCP ordered a MRI appt was on 06/08/22. Result showed possible stroke. Instructed to schedule a follow up appointment [facility]. Also showed corona radiata with surrounding restricted diffusion,   Discussed with Gabriel John, RN if can schedule with Camilo Cella or neurologist. Gabriel John checking with Camilo Cella to see if can schedule with her or need to be scheduled with Dr.Sethi. Nurse has put 06/11/23 at 1:15 pm on hold until discuss with [facility].

## 2023-06-12 ENCOUNTER — Telehealth: Payer: Self-pay | Admitting: *Deleted

## 2023-06-12 NOTE — Telephone Encounter (Signed)
Patient previously seen in 2022 for right thalamic stroke.  Patient now with new stroke symptoms, MRI brain completed at [facility] ordered by PCP with report below showing possible subacute stroke in the right corona radiata vs less likely focus of demyelination. Patient will have disc with her for appointment which would be best to be reviewed by you and be able to compare to prior MRI brain imaging and make further recommendations for further stroke workup/treatment if indicated. I also do not have any availability until April.

## 2023-06-12 NOTE — Telephone Encounter (Signed)
Patient is scheduled with Dr. Pearlean Brownie on 3/19.

## 2023-06-12 NOTE — Telephone Encounter (Signed)
Dr.Sethi patient is having new symptoms( see telephone encounter on 06/11/23) , PCP ordered MRI.   Pt scheduled to see you on 08/13/23 does patient need to be seen sooner?  Please advise

## 2023-06-16 ENCOUNTER — Encounter: Payer: Self-pay | Admitting: Oncology

## 2023-06-16 NOTE — Telephone Encounter (Signed)
Sooner apt available for this thur. Pt also brought the MRI disc in for Dr Pearlean Brownie to review.

## 2023-06-16 NOTE — Telephone Encounter (Signed)
Called husband and was able to get the patient worked in for sooner apt. She will come in on Thursday. They have brought in her MRI brain from [facility] so that Dr Pearlean Brownie can review.

## 2023-06-16 NOTE — Telephone Encounter (Signed)
FYI

## 2023-06-17 ENCOUNTER — Encounter: Payer: Self-pay | Admitting: Neurology

## 2023-06-17 ENCOUNTER — Ambulatory Visit: Payer: Medicare Other | Admitting: Neurology

## 2023-06-17 VITALS — BP 141/84 | HR 68 | Ht 61.0 in | Wt 120.0 lb

## 2023-06-17 DIAGNOSIS — Z8673 Personal history of transient ischemic attack (TIA), and cerebral infarction without residual deficits: Secondary | ICD-10-CM | POA: Diagnosis not present

## 2023-06-17 DIAGNOSIS — R202 Paresthesia of skin: Secondary | ICD-10-CM

## 2023-06-17 DIAGNOSIS — I6381 Other cerebral infarction due to occlusion or stenosis of small artery: Secondary | ICD-10-CM

## 2023-06-17 NOTE — Progress Notes (Signed)
[facility] [address] [place]. [place] [address] [phone]       OFFICE CONSULT NOTE  [name] Date of Birth:  1961-03-06 Medical Record Number:  [id number]   Referring MD: Philemon Kingdom  Reason for Referral: Stroke  HPI: [name] is a pleasant 63 year old Caucasian lady seen today for initial office consultation visit for stroke.  She is accompanied by her husband.  History is obtained from the patient and review of electronic medical records.  I personally viewed pertinent available imaging films in PACS.  She has past medical history of hyperlipidemia, bipolar disorder, gastroesophageal reflux disease, obstructive sleep apnea, osteopenia, thyroid disease, vitamin D deficiency.  Patient states on 05/26/2023 she woke up from sleep with slurred speech and tongue deviated to the left.  She did not go to the ER despite her symptoms persisting.  She eventually saw her primary care physician 3 days later.  She denies any extremity weakness numbness tingling or gait or balance difficulties at this time.  An outpatient MRI scan of the brain was done at [facility] on 06/09/2023 which I personally reviewed and reviewed films which were brought to me in disc.  There is a 1 cm right corona radiata subacute infarct which shows postcontrast enhancement.  No other stroke related neurovascular imaging on testing was done.  Patient was previously on aspirin and Plavix was added which she has been taking for the last 3 weeks and tolerating well without significant bleeding or bruising she feels her speech has improved and tongue movements have also improved.  She is currently wearing a 30-day heart monitor.  Patient does have a prior history of right thalamic infarct in September 2022 from small vessel disease.  She was seen by me in the office at that time and last office visit was on 3 /1/23 with Dawn Prince. Prior visit 03/29/21 Dawn Bumps, Dawn Prince  ): Dawn Prince is a 63 y.o. 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  Personally reviewed hospitalization pertinent progress notes, lab work and imaging.  Evaluated by Dr. Pearlean Brownie for right thalamic infarct secondary to small vessel disease.  CTA head/neck unremarkable.  2D echo EF 60 to 65% although echodensity noted therefore TEE completed which showed normal biventricular function without evidence of hemodynamically significant valvular heart disease, no evidence of thrombus and negative bubble study for intra-arterial shunt.  LDL 78.  A1c 6.1.  Recommended DAPT for 3 weeks and aspirin alone.  Increase home dose atorvastatin from 20 mg to 40 mg daily.  Evidence of pre-DM -no prior DM diagnosis.  No prior stroke history. CT spine showed diffuse lymphadenopathy throughout the neck consistent with B-cell lymphoma grossly stable since prior CT 09/2020.  Today, 03/29/2021, patient being seen for stroke follow up accompanied by her husband.   Overall stable since discharge. Denies new stroke/TIA symptoms.  Fatigues quick - present prior to her stroke with slight worsening post stroke Left outer hand and outer foot numbness - greatly improving  Short term memory difficulties and occasional difficulty finding the correct word but has been gradually improving Balance has been gradually improving - no longer uses AD  Chronic back pain - [facility] recently received right hip injection for bursitis  Continues on aspirin and plavix despite 3 week recommendations - mild bruising otherwise no side effects Continues on atorvastatin 40mg  daily - denies side  effects Blood pressure 122/78 - occasionally monitors at home - usually stable   No further concerns at this time ROS:   14 system review of systems is positive for tongue deviation , heaviness, numbness all other systems negative  PMH:  Past Medical History:  Diagnosis Date   Allergy    seasonal   Arthritis    Bipolar 1 disorder (HCC)    Cancer (HCC)    non hodgkins lymphoma   Depression    GERD (gastroesophageal reflux disease)    History of degenerative disc disease    Hyperlipidemia    Increased risk of breast cancer 04/25/2021   Obstructive sleep apnea    Osteopenia    Scoliosis    Thyroid disease    hypothyroidism   Urticaria    Vitamin D deficiency     Social History:  Social History   Socioeconomic History   Marital status: Married    Spouse name: Not on file   Number of children: 2   Years of education: Not on file   Highest education level: Associate degree: academic program  Occupational History   Not on file  Tobacco Use   Smoking status: Never   Smokeless tobacco: Never  Vaping Use   Vaping status: Never Used  Substance and Sexual Activity   Alcohol use: Not Currently   Drug use: Never   Sexual activity: Not on file  Other Topics Concern   Not on file  Social History Narrative   Right handed    1-2 cups of coffee per week   Lives at home with husband    Social Drivers of Corporate investment banker Strain: Not on file  Food Insecurity: Not on file  Transportation Needs: Not on file  Physical Activity: Not on file  Stress: Not on file  Social Connections: Unknown (10/09/2021)   Received from [facility]   Social Network    Social Network: Not on file  Intimate Partner Violence: Unknown (08/31/2021)   Received from [facility], [facility]   HITS    Physically Hurt: Not on file    Insult or Talk Down To: Not on file    Threaten Physical Harm: Not on file    Scream or Curse: Not on file    Medications:   Current  Outpatient Medications on File Prior to Visit  Medication Sig Dispense Refill   Calcium Carb-Cholecalciferol (CALCIUM 600 + D PO) Take by mouth. Twice daily     cetirizine (ZYRTEC) 10 MG tablet Take 10 mg by mouth daily.     Cholecalciferol (VITAMIN D) 50 MCG (2000 UT) CAPS Take by mouth.     estradiol (ESTRACE) 0.1 MG/GM vaginal cream 2 (two) times a week.     famotidine (PEPCID) 40 MG tablet Take 1 tablet (40 mg total) by mouth daily. 32 tablet 5   FLUoxetine HCl (PROZAC PO) Take 60 mg by mouth daily.     lansoprazole (PREVACID) 30 MG capsule      Multiple Vitamin (MULTIVITAMIN ADULT PO) Take by mouth. Vision MD once a day     Multiple Vitamins-Minerals (WOMENS MULTIVITAMIN PO) Take by mouth at bedtime.     PLAVIX 75 MG tablet Take 75 mg by mouth daily.     Probiotic Product (PROBIOTIC  BLEND PO) Take by mouth at bedtime.     ramipril (ALTACE) 2.5 MG capsule Take by mouth. 1 in am in 1 in pm     loratadine (CLARITIN) 10 MG tablet Take 1 tablet (10 mg total) by mouth daily. 32 tablet 5   No current facility-administered medications on file prior to visit.    Allergies:   Allergies  Allergen Reactions   Diclofenac Sodium     Thought it had something to do with her having a stroke   Voltaren [Diclofenac Sodium]     Thought it had something to do with her having a stroke   Nsaids     Had a stroke and worries it was related to the Diclofenac she took.    Physical Exam General: well developed, well nourished pleasant middle-age Caucasian lady, seated, in no evident distress Head: head normocephalic and atraumatic.   Neck: supple with no carotid or supraclavicular bruits Cardiovascular: regular rate and rhythm, no murmurs Musculoskeletal: no deformity Skin:  no rash/petichiae Vascular:  Normal pulses all extremities  Neurologic Exam Mental Status: Awake and fully alert. Oriented to place and time. Recent and remote memory intact. Attention span, concentration and fund of knowledge  appropriate. Mood and affect appropriate.  Cranial Nerves: Fundoscopic exam reveals sharp disc margins. Pupils equal, briskly reactive to light. Extraocular movements full without nystagmus. Visual fields full to confrontation. Hearing intact. Facial sensation intact.  Mild left nasolabial fold asymmetry., tongue, palate moves normally and symmetrically.  Motor: Normal bulk and tone. Normal strength in all tested extremity muscles.  Diminished fine finger movements on the left.  Orbits right over left upper extremity. Sensory.: intact to touch , pinprick , position and vibratory sensation.  Coordination: Rapid alternating movements normal in all extremities. Finger-to-nose and heel-to-shin performed accurately bilaterally. Gait and Station: Arises from chair without difficulty. Stance is normal. Gait demonstrates normal stride length and balance . Able to heel, toe and tandem walk without difficulty.  Reflexes: 1+ and symmetric. Toes downgoing.   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  Prior history of right thalamic infarct in September 2022 also from small vessel disease.  Vascular risk factors of hyperlipidemia and hypertension and prior strokes     PLAN:I had a long d/w patient and her husband about her recent lacunar stroke, risk for recurrent stroke/TIAs, personally independently reviewed imaging studies and stroke evaluation results and answered questions.Continue Plavix 75 mg daily alone and discontinue aspirin now for secondary stroke prevention and maintain strict control of hypertension with blood pressure goal below 130/90, diabetes with hemoglobin A1c goal below 6.5% and lipids with LDL cholesterol goal below 70 mg/dL. I also advised the patient to eat a healthy diet with plenty of whole grains, cereals, fruits  and vegetables, exercise regularly and maintain ideal body weight .check echocardiogram, continue ongoing cardiac monitoring for paroxysmal A-fib, check lipid profile, hemoglobin A1c, MR angiogram of the brain and neck.  Followup in the future with my nurse Prince in 6 months or call earlier if necessary. Greater than 50% time during this 45-minute consultation visit was spent in counseling and coordination of care about a lacunar stroke and discussion about stroke evaluation prevention and treatment and answering questions. Delia Heady, MD Note: This document was prepared with digital dictation and possible smart phrase technology. Any transcriptional errors that result  from this process are unintentional.

## 2023-06-17 NOTE — Patient Instructions (Signed)
I had a long d/w patient and her husband about her recent lacunar stroke, risk for recurrent stroke/TIAs, personally independently reviewed imaging studies and stroke evaluation results and answered questions.Continue Plavix 75 mg daily alone and discontinue aspirin now for secondary stroke prevention and maintain strict control of hypertension with blood pressure goal below 130/90, diabetes with hemoglobin A1c goal below 6.5% and lipids with LDL cholesterol goal below 70 mg/dL. I also advised the patient to eat a healthy diet with plenty of whole grains, cereals, fruits and vegetables, exercise regularly and maintain ideal body weight .check echocardiogram, continue ongoing cardiac monitoring for paroxysmal A-fib, check lipid profile, hemoglobin A1c, MR angiogram of the brain and neck.  Followup in the future with my nurse practitioner in 6 months or call earlier if necessary.  Stroke Prevention Some medical conditions and behaviors can lead to a higher chance of having a stroke. You can help prevent a stroke by eating healthy, exercising, not smoking, and managing any medical conditions you have. Stroke is a leading cause of functional impairment. Primary prevention is particularly important because a majority of strokes are first-time events. Stroke changes the lives of not only those who experience a stroke but also their family and other caregivers. How can this condition affect me? A stroke is a medical emergency and should be treated right away. A stroke can lead to brain damage and can sometimes be life-threatening. If a person gets medical treatment right away, there is a better chance of surviving and recovering from a stroke. What can increase my risk? The following medical conditions may increase your risk of a stroke: Cardiovascular disease. High blood pressure (hypertension). Diabetes. High cholesterol. Sickle cell disease. Blood clotting disorders (hypercoagulable  state). Obesity. Sleep disorders (obstructive sleep apnea). Other risk factors include: Being older than age 81. Having a history of blood clots, stroke, or mini-stroke (transient ischemic attack, TIA). Genetic factors, such as race, ethnicity, or a family history of stroke. Smoking cigarettes or using other tobacco products. Taking birth control pills, especially if you also use tobacco. Heavy use of alcohol or drugs, especially cocaine and methamphetamine. Physical inactivity. What actions can I take to prevent this? Manage your health conditions High cholesterol levels. Eating a healthy diet is important for preventing high cholesterol. If cholesterol cannot be managed through diet alone, you may need to take medicines. Take any prescribed medicines to control your cholesterol as told by your health care provider. Hypertension. To reduce your risk of stroke, try to keep your blood pressure below 130/80. Eating a healthy diet and exercising regularly are important for controlling blood pressure. If these steps are not enough to manage your blood pressure, you may need to take medicines. Take any prescribed medicines to control hypertension as told by your health care provider. Ask your health care provider if you should monitor your blood pressure at home. Have your blood pressure checked every year, even if your blood pressure is normal. Blood pressure increases with age and some medical conditions. Diabetes. Eating a healthy diet and exercising regularly are important parts of managing your blood sugar (glucose). If your blood sugar cannot be managed through diet and exercise, you may need to take medicines. Take any prescribed medicines to control your diabetes as told by your health care provider. Get evaluated for obstructive sleep apnea. Talk to your health care provider about getting a sleep evaluation if you snore a lot or have excessive sleepiness. Make sure that any other  medical conditions you have,  such as atrial fibrillation or atherosclerosis, are managed. Nutrition Follow instructions from your health care provider about what to eat or drink to help manage your health condition. These instructions may include: Reducing your daily calorie intake. Limiting how much salt (sodium) you use to 1,500 milligrams (mg) each day. Using only healthy fats for cooking, such as olive oil, canola oil, or sunflower oil. Eating healthy foods. You can do this by: Choosing foods that are high in fiber, such as whole grains, and fresh fruits and vegetables. Eating at least 5 servings of fruits and vegetables a day. Try to fill one-half of your plate with fruits and vegetables at each meal. Choosing lean protein foods, such as lean cuts of meat, poultry without skin, fish, tofu, beans, and nuts. Eating low-fat dairy products. Avoiding foods that are high in sodium. This can help lower blood pressure. Avoiding foods that have saturated fat, trans fat, and cholesterol. This can help prevent high cholesterol. Avoiding processed and prepared foods. Counting your daily carbohydrate intake.  Lifestyle If you drink alcohol: Limit how much you have to: 0-1 drink a day for women who are not pregnant. 0-2 drinks a day for men. Know how much alcohol is in your drink. In the [place] one drink equals one 12 oz bottle of beer ( ), one 5 oz glass of wine ( ), or one 1 oz glass of hard liquor (44mL). Do not use any products that contain nicotine or tobacco. These products include cigarettes, chewing tobacco, and vaping devices, such as e-cigarettes. If you need help quitting, ask your health care provider. Avoid secondhand smoke. Do not use drugs. Activity  Try to stay at a healthy weight. Get at least 30 minutes of exercise on most days, such as: Fast walking. Biking. Swimming. Medicines Take over-the-counter and prescription medicines only as told by your health care  provider. Aspirin or blood thinners (antiplatelets or anticoagulants) may be recommended to reduce your risk of forming blood clots that can lead to stroke. Avoid taking birth control pills. Talk to your health care provider about the risks of taking birth control pills if: You are over 26 years old. You smoke. You get very bad headaches. You have had a blood clot. Where to find more information American Stroke Association: www.strokeassociation.org Get help right away if: You or a loved one has any symptoms of a stroke. "BE FAST" is an easy way to remember the main warning signs of a stroke: B - Balance. Signs are dizziness, sudden trouble walking, or loss of balance. E - Eyes. Signs are trouble seeing or a sudden change in vision. F - Face. Signs are sudden weakness or numbness of the face, or the face or eyelid drooping on one side. A - Arms. Signs are weakness or numbness in an arm. This happens suddenly and usually on one side of the body. S - Speech. Signs are sudden trouble speaking, slurred speech, or trouble understanding what people say. T - Time. Time to call emergency services. Write down what time symptoms started. You or a loved one has other signs of a stroke, such as: A sudden, severe headache with no known cause. Nausea or vomiting. Seizure. These symptoms may represent a serious problem that is an emergency. Do not wait to see if the symptoms will go away. Get medical help right away. Call your local emergency services (911 in the [place] Do not drive yourself to the hospital. Summary You can help to prevent a stroke by eating healthy, exercising,  not smoking, limiting alcohol intake, and managing any medical conditions you may have. Do not use any products that contain nicotine or tobacco. These include cigarettes, chewing tobacco, and vaping devices, such as e-cigarettes. If you need help quitting, ask your health care provider. Remember "BE FAST" for warning signs of a  stroke. Get help right away if you or a loved one has any of these signs. This information is not intended to replace advice given to you by your health care provider. Make sure you discuss any questions you have with your health care provider. Document Revised: 04/15/2022 Document Reviewed: 04/15/2022 Elsevier Patient Education  2024 [employer].

## 2023-06-17 NOTE — Addendum Note (Signed)
Addended by: Jeannette Corpus on: 06/17/2023 02:18 PM   Modules accepted: Orders

## 2023-06-18 LAB — LIPID PANEL
Chol/HDL Ratio: 4.3 {ratio} (ref 0.0–4.4)
Cholesterol, Total: 238 mg/dL — ABNORMAL HIGH (ref 100–199)
HDL: 56 mg/dL (ref >39)
LDL Chol Calc (NIH): 162 mg/dL — ABNORMAL HIGH (ref 0–99)
Triglycerides: 111 mg/dL (ref 0–149)
VLDL Cholesterol Cal: 20 mg/dL (ref 5–40)

## 2023-06-18 LAB — HEMOGLOBIN A1C
Est. average glucose Bld gHb Est-mCnc: 111 mg/dL
Hgb A1c MFr Bld: 5.5 % (ref 4.8–5.6)

## 2023-06-19 ENCOUNTER — Telehealth: Payer: Self-pay | Admitting: Neurology

## 2023-06-19 ENCOUNTER — Encounter: Payer: Self-pay | Admitting: Neurology

## 2023-06-19 ENCOUNTER — Ambulatory Visit: Payer: Medicare Other | Admitting: Neurology

## 2023-06-19 NOTE — Telephone Encounter (Signed)
Spouse is asking if the echocardiogram can be scheduled in [place] and not [place].

## 2023-06-19 NOTE — Telephone Encounter (Signed)
Please see the below.

## 2023-06-23 ENCOUNTER — Encounter (HOSPITAL_BASED_OUTPATIENT_CLINIC_OR_DEPARTMENT_OTHER): Payer: Self-pay | Admitting: Radiology

## 2023-06-23 ENCOUNTER — Telehealth: Payer: Self-pay | Admitting: Neurology

## 2023-06-23 ENCOUNTER — Ambulatory Visit (INDEPENDENT_AMBULATORY_CARE_PROVIDER_SITE_OTHER)
Admission: RE | Admit: 2023-06-23 | Discharge: 2023-06-23 | Disposition: A | Discharge status: Home / Self Care | Payer: Medicare Other | Source: Ambulatory Visit | Attending: Oncology | Admitting: Oncology

## 2023-06-23 DIAGNOSIS — Z1239 Encounter for other screening for malignant neoplasm of breast: Secondary | ICD-10-CM

## 2023-06-23 DIAGNOSIS — Z1231 Encounter for screening mammogram for malignant neoplasm of breast: Secondary | ICD-10-CM

## 2023-06-23 NOTE — Telephone Encounter (Signed)
Spoke to Dawn Prince informed her it takes at least 2 weeks for insurance to approve test and set up appointments . Informed Dawn Prince Dr Pearlean Brownie is at hospital this week however will let him know she is waiting for him to review lab work and give his recommendations. Dawn Prince expressed understanding and thanked me for calling

## 2023-06-23 NOTE — Telephone Encounter (Signed)
[employer] medicare NPR sent to GI [phone]

## 2023-06-23 NOTE — Telephone Encounter (Signed)
I sent her a message on another phone note.

## 2023-06-24 ENCOUNTER — Ambulatory Visit: Payer: Medicare Other | Attending: Cardiology

## 2023-06-24 DIAGNOSIS — I6381 Other cerebral infarction due to occlusion or stenosis of small artery: Secondary | ICD-10-CM

## 2023-06-24 LAB — ECHOCARDIOGRAM COMPLETE
AR max vel: 2.12 cm2
AV Area VTI: 2.07 cm2
AV Area mean vel: 2.02 cm2
AV Mean grad: 5.3 mmHg
AV Peak grad: 10.2 mmHg
Ao pk vel: 1.6 m/s
Area-P 1/2: 3.39 cm2
S' Lateral: 2.2 cm

## 2023-06-25 ENCOUNTER — Encounter: Payer: Self-pay | Admitting: Oncology

## 2023-06-25 ENCOUNTER — Other Ambulatory Visit: Payer: Self-pay | Admitting: Neurology

## 2023-06-25 MED ORDER — ATORVASTATIN CALCIUM 80 MG PO TABS: 80.0000 mg | ORAL_TABLET | Freq: Every day | ORAL | 5 refills | Status: DC

## 2023-06-25 NOTE — Progress Notes (Signed)
Kindly inform the patient that screening test for diabetes was satisfactory.  Cholesterol profile was not with bad cholesterol being quite high.  Given that she is previously also had strokes I recommend she start taking atorvastatin 80 mg daily and I will send in the prescription to her pharmacy

## 2023-06-25 NOTE — Progress Notes (Signed)
Kindly inform the patient that echocardiogram study shows normal pumping function of the heart.  Normal valvular function.  Nothing to worry about

## 2023-06-27 ENCOUNTER — Ambulatory Visit
Admission: RE | Admit: 2023-06-27 | Discharge: 2023-06-27 | Disposition: A | Discharge status: Home / Self Care | Payer: Medicare Other | Source: Ambulatory Visit | Attending: Neurology | Admitting: Neurology

## 2023-06-27 DIAGNOSIS — I6381 Other cerebral infarction due to occlusion or stenosis of small artery: Secondary | ICD-10-CM | POA: Diagnosis not present

## 2023-06-27 MED ORDER — GADOPICLENOL 0.5 MMOL/ML IV SOLN
5.0000 mL | Freq: Once | INTRAVENOUS | Status: AC | PRN
  Administered 2023-06-27: 5 mL via INTRAVENOUS

## 2023-06-27 NOTE — Progress Notes (Signed)
 Dawn Prince Dawn Prince  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  She has been on observation only. She has had stable bilateral cervical and inguinal lymphadenopathy.  MRI imaging from February 2023 revealed extensive inguinal and retroperitoneal lymphadenopathy.  Her physical exam reveals a modest change in her cervical and supraclavicular adenopathy. PET scan in March of 2023 revealed continued stability of mild adenopathy in the neck, chest, abdomen and pelvis with low level FDG and Deauville 2-3 category uptake. The CT in September of 2023 shows the numerous mildly enlarged nodes to be unchanged. Her lymphadenopathy is relatively stable at this time but just mildly increased. Her CT chest, abdomen and pelvis done on 11/06/22 revealed stable bilateral axillary and supraclavicular adenopathy, no mediastinal lymphadenopathy, a new band of linear consolidation in the LEFT upper lobe, which is probably post infectious consolidation. She has stable retroperitoneal and proximal iliac lymphadenopathy, and no new adenopathy in the abdomen and pelvis.  There is a normal spleen and no skeletal lesions. She has just mild progression of her lymphadenopathy by exam.    B12 deficiency anemia She was treated with B12 injections then transitioned to oral B12.  She is no longer taking B12 as her B12 level has been elevated at the [facility].  On May 11, B12 was greater than 2000, iron studies and folate were normal.  She remains off B12 as her last level in June was over 1,000. It dropped to 350 in March, 2024 and so I had advised her to get back on oral B-12 and the level was back over 1,000 in June, 2024. We will continue to monitor this.  Pneumonia Left  upper lobe, lobar pneumonia in May, 2024, has been treated with multiple antibiotics and is clinically improving. CT scan shows resolution with a band of linear consolidation in the left upper lobe.  CVA She had another stroke in December, 2024 and is now on Plavix  75 mg. She has seen the neurologist and they are working on better control of her blood pressure and cholesterol.    Plan: On December 30th she noticed she was having slurred speech and a couple days later she noticed her tongue was laying more to the left. She went to neurology for follow-up and she had an echocardiogram, heart monitor, and MRI/MRA scans done. She was taken off of Aspirin  and placed on Plavix  75 mg and restarted Lipitor 80 mg. MRI of the head done on 06/09/2023 revealed small focus of enhancement in the right corona radiata with surrounding restricted diffusion and she was recommended a follow-up in 2-3 weeks. MR Angiogram of the neck done on 06/27/2023 that was clear and MR Angiogram of the intracranial arteries done on the same day revealed moderate stenosis in the P2 segment and mild stenosis in the P1 segment of the left posterior cerebral artery.  Normal flow noted in other intracranial arteries and the stenosis was not noted on the CT angiogram from 02/09/2021. Her screening bilateral mammogram done on 06/23/2023 was clear. She has a WBC of 5.0 with an ANC of 1400 with 29% neutrophile and 60% lymphocytes, hemoglobin of 12.3, platelet count of 157,000, and normal CMP. Her LDH is normal at 162 and her and vitamin B12 is pending. She had a lipid panel done  on 06/17/2023 with an elevated cholesterol of 238, elevated LDL of 162, and normal triglycerides of 111. We will continue surveillance. I will see her back in 3 months with CBC and CMP. The patient and her husband understand the plans discussed today and are in agreement with them.  She knows to contact our office if she develops concerns prior to her next appointment.  I  provided 16 minutes of face-to-face time during this encounter and > 50% was spent counseling as documented under my assessment and plan.   Dawn Prince  Dawn Prince Dawn Prince Dawn Prince [address] [place] [address] Dept: [phone] Dept Fax: [phone]   No orders of the defined types were placed in this encounter.   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  Staging CT chest revealed adenopathy of the bilateral neck measuring up to 18 mm in diameter with left subclavian, bilateral axillary and subpectoral adenopathy, but no mediastinal nodes.  CT abdomen was  negative.  CT pelvis revealed bilateral external iliac nodes up to 11 mm in diameter.  She had B symptoms with severe night sweats and weight loss.  She has been on  observation only.  Due to her family history of breast cancer, she underwent testing for hereditary breast and ovarian cancer with the [facility] myRisk Hereditary Cancer Gene panel test.  This did not reveal any clinically significant mutation.  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  Her [facility] breast cancer risk assessment showed her lifetime risk of breast cancer to be 29.7%, so annual breast MRI, in addition to mammogram is recommended.  Mammogram and MRI breast done in August 2019 did not reveal any evidence of malignancy.  She did go for a second opinion to [facility] regarding her lymphoma and they concurred with the approach of watchful waiting.    She presented to the emergency room in mid April 2021 due to increased urinary frequency and discomfort.  CT imaging revealed left sided obstructive uropathy with 3 mm calculus in the distal left ureter just proximal to  the ureterovesical unction causing moderate hydroureteronephrosis and perinephric stranding.  Left greater than right iliac and pelvic lymphadenopathy, slightly greater than on 10/26/18, consistent with known history of lymphoma.  We did not repeat a scan in June as scheduled.  Annual screening bilateral mammogram from August 2021 was clear.  MRI breast has not been repeated due to her comorbidities.   CT neck in May 2022 revealed multiple lymph nodes in the neck bilaterally, with mild progression of lymph nodes on the right. There has been more significant progression of left level 4 and left supraclavicular lymph nodes compared to the prior study. Largest lymph node in the left supraclavicular region measures 35 x 17 mm. Findings were compatible with lymphoma. CT chest/abdomen/pelvis revealed no substantial interval change in exam. Bilateral supraclavicular, subpectoral, axillary, retroperitoneal, and pelvic lymphadenopathy was similar to prior. There were no definite findings of progression.  At her visit in September, the lymphoma remained stable. She continued to have sciatic pain unrelieved with gabapentin  800 mg TID. She had steroid injections of the spine with improvement.    Bilateral screening mammogram in January of 2023 revealed a possible asymmetry in  the left breast.  She underwent left diagnostic mammogram and the asymmetry disperses with compression, so it is felt to be dense fibroglandular tissue.  There is no evidence of malignancy.  Bilateral screening in 1 year was recommended.  MRI of the right hip from February 2023 revealed extensive inguinal and retroperitoneal lymphadenopathy. There are high-grade tears of the right gluteus numbness greater than medius with background tendinosis and trochanteric bursitis, and bilateral hamstring origin tendinosis with mild partial tearing. She has mild bilateral hip osteoarthritis, and  right sided labral tear. She states that she has chronic palpable  lymph nodes of the left neck and supraclavicular area.  She therefore underwent PET scan in March of 2023, which revealed stable disease when compared to previous CT images.   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)  11/11/2015 Cancer Staging   Staging form: Hodgkin and Non-Hodgkin Lymphoma, AJCC 8th Edition - Clinical stage from 11/11/2015: Stage III (Small lymphocytic leukemia) - Signed by Cornelius Dawn DEL, Prince on 01/28/2021 Histopathologic type: Malignant lymphoma, small B lymphocytic, NOS (see also M-9823/3) Stage prefix: Initial diagnosis Diagnostic confirmation: Positive histology PLUS positive immunophenotyping and/or positive genetic studies Specimen type: Core Needle Biopsy Staged by: Managing physician Stage used in treatment planning: Yes National guidelines used in treatment planning: Yes Type of national guideline used in treatment planning: NCCN Staging comments: Watchful waiting   06/20/2020 Initial Diagnosis   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)   Chronic lymphocytic leukemia (CLL), B-cell (HCC)  10/31/2015 Initial Diagnosis   Chronic lymphocytic leukemia (CLL), B-cell (HCC)   11/11/2015 Cancer Staging   Staging form: Chronic Lymphocytic Leukemia / Small Lymphocytic Lymphoma, AJCC 8th Edition - Clinical stage from 11/11/2015: Modified Rai Stage III (Modified Rai risk: High, Binet: Stage B, Lugano: Stage III, Lymphocytosis: Absent, Adenopathy: Present, Organomegaly: Absent, Anemia: Absent, Thrombocytopenia: Absent) - Signed by Cornelius Dawn DEL, Prince on 04/05/2021 Histopathologic type: B-cell lymphocytic leukemia/small lymphocytic lymphoma (see also M-9670/3) Stage prefix: Initial diagnosis Stage used in treatment planning: Yes National guidelines used in treatment planning: Yes Type of national guideline used in treatment planning: NCCN       INTERVAL HISTORY:  Dawn Prince  is here today for repeat clinical  assessment for small cell B cell lymphoma. She has never required treatment for this but she does have slow progression of her lymphadenopathy and lymphocytosis. Patient states that she feels well and has no complaints of pain. She informed me that she had another stroke in December. On December 30th she noticed she was having slurred speech and a couple days later she noticed her tongue was laying more to the left. She went to neurology for follow-up and she had an echocardiogram, heart monitor, and MRI/MRA scans done. She was taken off of Aspirin  and placed on Plavix  75 mg and restarted Lipitor 80 mg. MRI of the head done on 06/09/2023 revealed a small focus of enhancement in the right corona radiata with surrounding restricted diffusion and she was recommended a follow-up in 2-3 weeks. MR Angiogram of the neck done on 06/27/2023 was clear and MR Angiogram of the intracranial arteries done on the same day revealed moderate stenosis in the P2 segment and mild stenosis in the P1 segment of the left posterior cerebral artery.  Normal flow noted in other intracranial arteries and the stenosis was not noted on the CT angiogram from 02/09/2021. Her screening bilateral mammogram done on 06/23/2023 was clear. She has a WBC of 5.0  with ANC of 1400 with 29% neutrophile and 60% lymphocytes, a  hemoglobin of 12.3, platelet count of 157,000, and normal CMP. Her LDH is normal at 162 and her and vitamin B12 is pending. She had a lipid panel done on 06/17/2023 with a elevated cholesterol of 238, an LDL of 162, and normal triglycerides of 111. We will continue surveillance. I will see her back in 3 months with CBC and CMP.  She denies signs of infection such as sore throat, sinus drainage, cough, or urinary symptoms.  She denies fevers or recurrent chills. She denies pain. She denies nausea, vomiting, chest pain, dyspnea or cough. Her appetite is good and her weight has increased 5 pounds over last 3.5 months    REVIEW OF  SYSTEMS:  Review of Systems  Constitutional:  Positive for fatigue. Negative for appetite change, chills, diaphoresis, fever and unexpected weight change.  HENT:  Negative.  Negative for hearing loss, lump/mass, mouth sores, nosebleeds, sore throat, tinnitus, trouble swallowing and voice change.   Eyes: Negative.  Negative for eye problems and icterus.  Respiratory:  Negative for chest tightness, cough, hemoptysis, shortness of breath and wheezing.   Cardiovascular:  Negative for chest pain, leg swelling and palpitations.  Gastrointestinal:  Negative for abdominal distention, abdominal pain, blood in stool, constipation, diarrhea, nausea, rectal pain and vomiting.  Endocrine: Negative.   Genitourinary:  Negative for bladder incontinence, difficulty urinating, dyspareunia, dysuria, frequency, hematuria, menstrual problem, nocturia, pelvic pain, vaginal bleeding and vaginal discharge.   Musculoskeletal:  Positive for arthralgias (right shoulder improved but still hurts). Negative for back pain, flank pain, gait problem, myalgias, neck pain and neck stiffness.       Hip pain  Skin: Negative.  Negative for itching, rash and wound.  Neurological:  Positive for headaches and speech difficulty (in december, transient with CVA). Negative for dizziness, extremity weakness, gait problem, light-headedness, numbness and seizures.  Hematological:  Negative for adenopathy. Bruises/bleeds easily.  Psychiatric/Behavioral:  Positive for sleep disturbance. Negative for confusion, decreased concentration, depression and suicidal ideas. The patient is not nervous/anxious.     VITALS:  Blood pressure 135/72, pulse 66, temperature 97.7 F (36.5 C), temperature source Oral, resp. rate 18, height 5' 1 (1.549 m), weight 115 lb 9.6 oz (52.4 kg), SpO2 100%.  Wt Readings from Last 3 Encounters:  07/01/23 115 lb 9.6 oz (52.4 kg)  06/17/23 120 lb (54.4 kg)  03/07/23 110 lb 14.4 oz (50.3 kg)    Body mass index is 21.84  kg/m.  Performance status (ECOG): 2 - Symptomatic, <50% confined to bed  PHYSICAL EXAM:  Physical Exam Vitals and nursing note reviewed. Exam conducted with a chaperone present.  Constitutional:      General: She is not in acute distress.    Appearance: Normal appearance. She is normal weight. She is not ill-appearing, toxic-appearing or diaphoretic.  HENT:     Head: Normocephalic and atraumatic.     Right Ear: Tympanic membrane, ear canal and external ear normal. There is no impacted cerumen.     Left Ear: Tympanic membrane, ear canal and external ear normal. There is no impacted cerumen.     Nose: Nose normal. No congestion or rhinorrhea.     Mouth/Throat:     Mouth: Mucous membranes are moist.     Pharynx: Oropharynx is clear. No oropharyngeal exudate or posterior oropharyngeal erythema.  Eyes:     General: No scleral icterus.       Right eye: No discharge.  Left eye: No discharge.     Extraocular Movements: Extraocular movements intact.     Conjunctiva/sclera: Conjunctivae normal.     Pupils: Pupils are equal, round, and reactive to light.  Neck:     Vascular: No carotid bruit.     Comments:   Cardiovascular:     Rate and Rhythm: Normal rate and regular rhythm.     Pulses: Normal pulses.     Heart sounds: Normal heart sounds. No murmur heard.    No friction rub. No gallop.  Pulmonary:     Effort: Pulmonary effort is normal. No respiratory distress.     Breath sounds: Normal breath sounds. No stridor. No wheezing, rhonchi or rales.  Chest:     Chest wall: No tenderness.  Abdominal:     General: Bowel sounds are normal. There is no distension.     Palpations: Abdomen is soft. There is no hepatomegaly, splenomegaly or mass.     Tenderness: There is no abdominal tenderness. There is no right CVA tenderness, left CVA tenderness, guarding or rebound.     Hernia: No hernia is present.  Musculoskeletal:        General: No swelling, tenderness, deformity or signs of  injury. Normal range of motion.     Cervical back: Normal range of motion and neck supple. No rigidity or tenderness.     Right lower leg: No edema.     Left lower leg: No edema.  Lymphadenopathy:     Cervical: Cervical adenopathy present.     Right cervical: Superficial cervical adenopathy and posterior cervical adenopathy present.     Left cervical: Posterior cervical adenopathy present.     Upper Body:     Right upper body: Supraclavicular adenopathy and axillary adenopathy present. No pectoral adenopathy.     Left upper body: Supraclavicular adenopathy and axillary adenopathy present. No pectoral adenopathy.     Lower Body: Right inguinal adenopathy present. Left inguinal adenopathy present.     Comments: 2cm left posterior cervical node. Several bilateral 1cm cervical nodes. Bilateral supraclavicular nodes also mostly 1cm.  Left axillary node which is 2-3cm.  Three nodes in the right axilla that measure about 1cm.  2cm node in the left inguinal area and additional node lateral to that.   Skin:    General: Skin is warm and dry.     Coloration: Skin is not jaundiced or pale.     Findings: No bruising, erythema, lesion or rash.  Neurological:     General: No focal deficit present.     Mental Status: She is alert and oriented to person, place, and time. Mental status is at baseline.     Cranial Nerves: No cranial nerve deficit.     Sensory: No sensory deficit.     Motor: No weakness.     Coordination: Coordination normal.     Gait: Gait normal.     Deep Tendon Reflexes: Reflexes normal.  Psychiatric:        Mood and Affect: Mood normal.        Behavior: Behavior normal.        Thought Content: Thought content normal.        Judgment: Judgment normal.     LABS:      Latest Ref Rng & Units 07/01/2023    8:58 AM 02/26/2023    8:31 AM 02/17/2023    9:22 AM  CBC  WBC 4.0 - 10.5 K/uL 5.0  6.0  5.4   Hemoglobin 12.0 - 15.0  g/dL 87.6  87.2  86.9   Hematocrit 36.0 - 46.0 % 36.5   39.9  40.4   Platelets 150 - 400 K/uL 157  197  210       Latest Ref Rng & Units 07/01/2023    8:58 AM 02/26/2023    8:31 AM 02/17/2023    9:22 AM  CMP  Glucose 70 - 99 mg/dL 890  886  95   BUN 8 - 23 mg/dL 13  16  10    Creatinine 0.44 - 1.00 mg/dL 9.14  9.21  9.28   Sodium 135 - 145 mmol/L 140  139  142   Potassium 3.5 - 5.1 mmol/L 4.2  4.4  5.0   Chloride 98 - 111 mmol/L 104  104  102   CO2 22 - 32 mmol/L 28  27  26    Calcium  8.9 - 10.3 mg/dL 9.4  9.1  9.9   Total Protein 6.5 - 8.1 g/dL 6.7  7.3  6.8   Total Bilirubin 0.0 - 1.2 mg/dL 0.2  0.2  <9.7   Alkaline Phos 38 - 126 U/L 63  49  59   AST 15 - 41 U/L 25  22  23    ALT 0 - 44 U/L 19  20  15     Lab Results  Component Value Date   TSH 3.350 02/17/2023   T4TOTAL 6.9 02/17/2023   No results found for: CEA1, CEA / No results found for: CEA1, CEA No results found for: PSA1 No results found for: CAN199 No results found for: CAN125  No results found for: STEPHANY RINGS, A1GS, A2GS, BETS, BETA2SER, GAMS, MSPIKE, SPEI Lab Results  Component Value Date   TIBC 322 12/12/2022   TIBC 331 07/23/2021   FERRITIN 19 12/12/2022   FERRITIN 22 07/23/2021   IRONPCTSAT 30 12/12/2022   IRONPCTSAT 20 07/23/2021   Lab Results  Component Value Date   LDH 162 07/01/2023   LDH 136 10/30/2021   LDH 142 07/20/2021    STUDIES:   EXAM: 06/27/2023 MR angiogram of the intracranial arteries  IMPRESSION:  This MR angiogram of the head shows the following: 1.  Moderate stenosis in the P2 segment and mild stenosis in the P1 segment of the left posterior cerebral artery.  Will distal flow appeared normal.  The stenosis was not noted on the CT angiogram from 02/09/2021. 2.  Normal flow noted in other intracranial arteries.  EXAM: 06/27/2023 MR Angiogram of the Neck Arteries with and without contrast  IMPRESSION: This is a normal MR angiogram of the neck arteries with and without contrast   EXAM:  06/23/2023 DIGITAL SCREENING BILATERAL MAMMOGRAM WITH TOMOSYNTHESIS AND CAD IMPRESSION: No mammographic evidence of malignancy.    HISTORY:   Past Medical History:  Diagnosis Date   Allergy    seasonal   Arthritis    Bipolar 1 disorder (HCC)    Cancer (HCC)    non hodgkins lymphoma   Depression    GERD (gastroesophageal reflux disease)    History of degenerative disc disease    Hyperlipidemia    Increased risk of breast cancer 04/25/2021   Obstructive sleep apnea    Osteopenia    Scoliosis    Thyroid  disease    hypothyroidism   Urticaria    Vitamin D  deficiency     Past Surgical History:  Procedure Laterality Date   BREAST BIOPSY     BUBBLE STUDY  02/13/2021   Procedure: BUBBLE STUDY;  Surgeon: Raford Riggs, Prince;  Location: [facility];  Service: Cardiovascular;;   CARPAL TUNNEL RELEASE Bilateral 2002   CESAREAN SECTION     x2   COLONOSCOPY  07/26/2008   Melanosis coli. Small internal hemorrhoids.    ENDOSCOPIC PLANTAR FASCIOTOMY     ESOPHAGOGASTRODUODENOSCOPY  03/17/2013   Mild gastritis. Status post esophageal dilatation.   LYMPH NODE BIOPSY     right hip repair torn tendon Right 09/2021   TEE WITHOUT CARDIOVERSION N/A 02/13/2021   Procedure: TRANSESOPHAGEAL ECHOCARDIOGRAM (TEE);  Surgeon: Raford Riggs, Prince;  Location: [facility];  Service: Cardiovascular;  Laterality: N/A;   WISDOM TOOTH EXTRACTION      Family History  Problem Relation Age of Onset   Prostate cancer Father 15   Melanoma Father 59   High blood pressure Father    Breast cancer Maternal Aunt    Multiple myeloma Maternal Aunt    Breast cancer Maternal Aunt    Colon cancer Neg Hx    Colon polyps Neg Hx    Esophageal cancer Neg Hx    Rectal cancer Neg Hx    Stomach cancer Neg Hx     Social History:  reports that she has never smoked. She has never used smokeless tobacco. She reports that she does not currently use alcohol. She reports that she does not use drugs.The patient is  accompanied by her husband today.  Allergies:  Allergies  Allergen Reactions   Diclofenac Sodium     Thought it had something to do with her having a stroke   Voltaren [Diclofenac Sodium]     Thought it had something to do with her having a stroke   Nsaids     Had a stroke and worries it was related to the Diclofenac she took.    Current Medications: Current Outpatient Medications  Medication Sig Dispense Refill   atorvastatin  (LIPITOR) 80 MG tablet Take 1 tablet (80 mg total) by mouth daily. 30 tablet 5   Calcium  Carb-Cholecalciferol (CALCIUM  600 + D PO) Take by mouth. Twice daily     cetirizine (ZYRTEC) 10 MG tablet Take 10 mg by mouth daily.     Cholecalciferol (VITAMIN D ) 50 MCG (2000 UT) CAPS Take by mouth.     estradiol (ESTRACE) 0.1 MG/GM vaginal cream 2 (two) times a week.     famotidine  (PEPCID ) 40 MG tablet Take 1 tablet (40 mg total) by mouth daily. 32 tablet 5   FLUoxetine  HCl (PROZAC  PO) Take 60 mg by mouth daily.     lansoprazole (PREVACID) 30 MG capsule      Multiple Vitamin (MULTIVITAMIN ADULT PO) Take by mouth. [facility] once a day     Multiple Vitamins-Minerals (WOMENS MULTIVITAMIN PO) Take by mouth at bedtime.     PLAVIX  75 MG tablet Take 75 mg by mouth daily.     Probiotic Product (PROBIOTIC BLEND PO) Take by mouth at bedtime.     ramipril  (ALTACE ) 2.5 MG capsule Take by mouth. 1 in am in 1 in pm     No current facility-administered medications for this visit.    I,[name],acting as a scribe for Dawn Prince.,have documented all relevant documentation on the behalf of Dawn Prince,as directed by  Dawn Prince while in the presence of Dawn Prince.

## 2023-06-30 NOTE — Telephone Encounter (Signed)
Spoke to pt states was not on Atorvastatin 80mg  when blood work was taken . Pt states only was taking fish oil Pt states only started taking Atorvastatin 80mg  after blood work and recommendation from Dr Pearlean Brownie . Per Casey,RN please continue taking Atorvastatin 80mg  and f/u with PCP in 2 months and get cholesterol panel redrawn . Will send results to PCP . Pt has  concerns about MRA results informed pt will forward results to work in [place] so they can review due to Dr Pearlean Brownie is out the office this week .Will call back after MD reviews and gives recommendation. Pt expressed understanding and thanked me for calling .

## 2023-07-01 ENCOUNTER — Telehealth: Payer: Self-pay | Admitting: Oncology

## 2023-07-01 ENCOUNTER — Other Ambulatory Visit: Payer: Self-pay | Admitting: Oncology

## 2023-07-01 ENCOUNTER — Inpatient Hospital Stay: Payer: Medicare Other

## 2023-07-01 ENCOUNTER — Inpatient Hospital Stay: Payer: Medicare Other | Attending: Oncology | Admitting: Oncology

## 2023-07-01 ENCOUNTER — Encounter: Payer: Self-pay | Admitting: Oncology

## 2023-07-01 VITALS — BP 135/72 | HR 66 | Temp 97.7°F | Resp 18 | Ht 61.0 in | Wt 115.6 lb

## 2023-07-01 DIAGNOSIS — C8302 Small cell B-cell lymphoma, intrathoracic lymph nodes: Secondary | ICD-10-CM

## 2023-07-01 DIAGNOSIS — R590 Localized enlarged lymph nodes: Secondary | ICD-10-CM

## 2023-07-01 DIAGNOSIS — D519 Vitamin B12 deficiency anemia, unspecified: Secondary | ICD-10-CM

## 2023-07-01 DIAGNOSIS — C9110 Chronic lymphocytic leukemia of B-cell type not having achieved remission: Secondary | ICD-10-CM

## 2023-07-01 DIAGNOSIS — Z7902 Long term (current) use of antithrombotics/antiplatelets: Secondary | ICD-10-CM

## 2023-07-01 DIAGNOSIS — Z79899 Other long term (current) drug therapy: Secondary | ICD-10-CM | POA: Diagnosis not present

## 2023-07-01 DIAGNOSIS — Z8701 Personal history of pneumonia (recurrent): Secondary | ICD-10-CM

## 2023-07-01 DIAGNOSIS — Z8673 Personal history of transient ischemic attack (TIA), and cerebral infarction without residual deficits: Secondary | ICD-10-CM | POA: Diagnosis not present

## 2023-07-01 DIAGNOSIS — Z8572 Personal history of non-Hodgkin lymphomas: Secondary | ICD-10-CM

## 2023-07-01 LAB — CBC WITH DIFFERENTIAL (CANCER CENTER ONLY)
Abs Immature Granulocytes: 0.01 10*3/uL (ref 0.00–0.07)
Basophils Absolute: 0 10*3/uL (ref 0.0–0.1)
Basophils Relative: 0 %
Eosinophils Absolute: 0.1 10*3/uL (ref 0.0–0.5)
Eosinophils Relative: 1 %
HCT: 36.5 % (ref 36.0–46.0)
Hemoglobin: 12.3 g/dL (ref 12.0–15.0)
Immature Granulocytes: 0 %
Lymphocytes Relative: 60 %
Lymphs Abs: 3 10*3/uL (ref 0.7–4.0)
MCH: 33.8 pg (ref 26.0–34.0)
MCHC: 33.7 g/dL (ref 30.0–36.0)
MCV: 100.3 fL — ABNORMAL HIGH (ref 80.0–100.0)
Monocytes Absolute: 0.5 10*3/uL (ref 0.1–1.0)
Monocytes Relative: 10 %
Neutro Abs: 1.4 10*3/uL — ABNORMAL LOW (ref 1.7–7.7)
Neutrophils Relative %: 29 %
Platelet Count: 157 10*3/uL (ref 150–400)
RBC: 3.64 MIL/uL — ABNORMAL LOW (ref 3.87–5.11)
RDW: 13.3 % (ref 11.5–15.5)
WBC Count: 5 10*3/uL (ref 4.0–10.5)
nRBC: 0 % (ref 0.0–0.2)
nRBC: 0 /100{WBCs}

## 2023-07-01 LAB — CMP (CANCER CENTER ONLY)
ALT: 19 U/L (ref 0–44)
AST: 25 U/L (ref 15–41)
Albumin: 4.1 g/dL (ref 3.5–5.0)
Alkaline Phosphatase: 63 U/L (ref 38–126)
Anion gap: 8 (ref 5–15)
BUN: 13 mg/dL (ref 8–23)
CO2: 28 mmol/L (ref 22–32)
Calcium: 9.4 mg/dL (ref 8.9–10.3)
Chloride: 104 mmol/L (ref 98–111)
Creatinine: 0.85 mg/dL (ref 0.44–1.00)
GFR, Estimated: >60 mL/min (ref >60)
Glucose, Bld: 109 mg/dL — ABNORMAL HIGH (ref 70–99)
Potassium: 4.2 mmol/L (ref 3.5–5.1)
Sodium: 140 mmol/L (ref 135–145)
Total Bilirubin: 0.2 mg/dL (ref 0.0–1.2)
Total Protein: 6.7 g/dL (ref 6.5–8.1)

## 2023-07-01 LAB — VITAMIN B12: Vitamin B-12: 533 pg/mL (ref 180–914)

## 2023-07-01 LAB — LACTATE DEHYDROGENASE: LDH: 162 U/L (ref 98–192)

## 2023-07-01 NOTE — Telephone Encounter (Signed)
07/01/23 Next appt scheduled and confirmed with patient.

## 2023-07-06 ENCOUNTER — Encounter: Payer: Self-pay | Admitting: Oncology

## 2023-07-07 ENCOUNTER — Other Ambulatory Visit: Payer: Self-pay

## 2023-07-07 ENCOUNTER — Encounter: Payer: Self-pay | Admitting: Internal Medicine

## 2023-07-07 MED ORDER — FAMOTIDINE 20 MG PO TABS: 20.0000 mg | ORAL_TABLET | Freq: Two times a day (BID) | ORAL | 5 refills | Status: DC

## 2023-07-08 ENCOUNTER — Other Ambulatory Visit: Payer: Medicare Other

## 2023-07-11 DIAGNOSIS — Z8673 Personal history of transient ischemic attack (TIA), and cerebral infarction without residual deficits: Secondary | ICD-10-CM | POA: Diagnosis not present

## 2023-07-15 ENCOUNTER — Telehealth: Payer: Self-pay | Admitting: Cardiology

## 2023-07-15 NOTE — Telephone Encounter (Signed)
 Please send zio monitor results to [phone] [facility]

## 2023-07-17 NOTE — Telephone Encounter (Signed)
 Called patient to inform her that her zio monitor results had been faxed to the number provided with attention to [facility]. Patient did not answer the phone and a detailed message was left on her voice mail explaining that the zio report had been faxed and if she had any questions to call Dr. Hulen Shouts office.

## 2023-07-17 NOTE — Telephone Encounter (Signed)
 Called the patient's husband and informed him that the patient's heart monitor results had been faxed to Dr. Sheryle Hail office. The patient's husband was appreciative for the call and had no further questions at this time.

## 2023-07-17 NOTE — Telephone Encounter (Signed)
Patient's husband is returning call. 

## 2023-08-13 ENCOUNTER — Ambulatory Visit: Payer: BC Managed Care – PPO | Admitting: Neurology

## 2023-08-18 ENCOUNTER — Ambulatory Visit: Payer: BC Managed Care – PPO | Admitting: Internal Medicine

## 2023-08-18 ENCOUNTER — Encounter: Payer: Self-pay | Admitting: Internal Medicine

## 2023-08-18 VITALS — BP 124/68 | HR 67 | Temp 97.5°F | Resp 16 | Wt 115.8 lb

## 2023-08-18 DIAGNOSIS — C8302 Small cell B-cell lymphoma, intrathoracic lymph nodes: Secondary | ICD-10-CM

## 2023-08-18 DIAGNOSIS — Z8673 Personal history of transient ischemic attack (TIA), and cerebral infarction without residual deficits: Secondary | ICD-10-CM

## 2023-08-18 DIAGNOSIS — L501 Idiopathic urticaria: Secondary | ICD-10-CM

## 2023-08-18 MED ORDER — EPINEPHRINE 0.3 MG/0.3ML IJ SOAJ: 0.3000 mg | INTRAMUSCULAR | 1 refills | Status: DC | PRN

## 2023-08-18 NOTE — Progress Notes (Signed)
 FOLLOW UP Date of Service/Encounter:  08/18/23  Subjective:  Dawn Prince (DOB: 1960-08-10) is a 63 y.o. female who [facility] in re-evaluation of the following: urticaria  History obtained from: chart review and patient.  For Review, LV was on 02/17/23  with Dr. Marlynn Perking seen for intial visit for urticaria  . See below for summary of history and diagnostics.   Therapeutic plans/changes recommended: urticaria labs ordered, increase Claritin to twice daily added Pepcid twice daily ----------------------------------------------------- Pertinent History/Diagnostics:  Allergic Rhinitis:   sneezing, rhinnorhea, in spring and fall; no animal triggers; usually treats with as needed claritin  - sIgE (02/17/23): negative  Urticaria:   claritin 10mg  twice daily, pepcid 20mg  twice daily - started 02/17/23  - labs (08/17/22): normal/negative, low MCV -> referred back to PCP Other: She was diagnosed with [place]- lymphoma (2017) and hypothyroidism (2022) and known iron deficiency   --------------------------------------------------- Today presents for follow-up. Discussed the use of AI scribe software for clinical note transcription with the patient, who gave verbal consent to proceed.  History of Present Illness Dawn Prince is a 63 year old female with a history of lymphoma who presents with persistent hives.  She experiences persistent hives, which have shown some improvement with the use of Zyrtec. She takes Zyrtec at a dose of twice a day, but experiences breakthrough hives between doses. She has also been using Pepcid as part of her treatment regimen. During the review of symptoms, she mentioned that she has had to increase her Zyrtec dosage to up to 3 times a day on occasion to manage flare-ups, although she is cautious about using high doses due to potential sedative effects.  She has a history of lymphoma, which is  currently being monitored with a 'watch and wait' approach, and she is not on any specific treatment for it. She takes various supplements but did not specify which ones.  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 She is currently on Plavix as part of her management for stroke prevention.     All medications reviewed by clinical staff and updated in chart. No new pertinent medical or surgical history except as noted in HPI.  ROS: All others negative except as noted per HPI.   Objective:  BP 124/68   Pulse 67   Temp (!) 97.5 F (36.4 C) (Temporal)   Resp 16   Wt 115 lb 12.8 oz (52.5 kg)   SpO2 100%   BMI 21.88 kg/m  Body mass index is 21.88 kg/m. Physical Exam: General Appearance:  Alert, cooperative, no distress, appears stated age  Head:  Normocephalic, without obvious abnormality, atraumatic  Eyes:  Conjunctiva clear, EOM's intact  Ears EACs normal bilaterally  Nose: Nares normal,     Throat: Lips, tongue normal; teeth and gums normal,     Neck: Supple, symmetrical  Lungs:     , Respirations unlabored, no coughing  Heart:    , Appears well perfused  Extremities: No edema  Skin: Skin color, texture, turgor normal and no rashes or lesions on visualized portions of skin  Neurologic: No gross deficits   Labs:  Lab Orders  No laboratory test(s) ordered today     Assessment/Plan   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 Wait until hives free for at least one  month prior to decreasing dose.     Follow up: 6 months   Other:     Thank you so much for letting me partake in your care today.  Don't hesitate to reach out if you have any additional concerns!  Ferol Luz, MD  [facility] , [place]

## 2023-08-18 NOTE — Patient Instructions (Addendum)
 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 Wait until hives free for at least one month prior to decreasing dose.     Follow up: 6 months   Thank you so much for letting me partake in your care today.  Don't hesitate to reach out if you have any additional concerns!  Ferol Luz, MD  [facility]- [place], [place]

## 2023-08-25 ENCOUNTER — Telehealth: Payer: Self-pay | Admitting: *Deleted

## 2023-08-25 NOTE — Telephone Encounter (Signed)
-----   Message from Ferol Luz sent at 08/18/2023  1:28 PM EDT ----- I would like to start this patient on xolair for hives.  300mg  every 4 weeks

## 2023-08-25 NOTE — Telephone Encounter (Signed)
 Called patient and discussed PPP vs OOP for Xolair since her income excludes her from patient assistance coverage. She will discuss with husband and reach back out to me

## 2023-08-28 ENCOUNTER — Telehealth: Payer: Self-pay

## 2023-08-28 ENCOUNTER — Encounter: Payer: Self-pay | Admitting: Oncology

## 2023-08-28 NOTE — Telephone Encounter (Signed)
 Patient has called to let us know that her provider she sees for her chronic hives is wanting to start her on Xolair. Patient would like you opinion and approval before she starts this medication.

## 2023-09-01 ENCOUNTER — Encounter: Payer: Self-pay | Admitting: Oncology

## 2023-09-01 DIAGNOSIS — C9110 Chronic lymphocytic leukemia of B-cell type not having achieved remission: Secondary | ICD-10-CM | POA: Diagnosis not present

## 2023-09-01 DIAGNOSIS — M48061 Spinal stenosis, lumbar region without neurogenic claudication: Secondary | ICD-10-CM | POA: Diagnosis not present

## 2023-09-01 DIAGNOSIS — E785 Hyperlipidemia, unspecified: Secondary | ICD-10-CM | POA: Diagnosis not present

## 2023-09-01 DIAGNOSIS — L509 Urticaria, unspecified: Secondary | ICD-10-CM | POA: Diagnosis not present

## 2023-09-01 DIAGNOSIS — I10 Essential (primary) hypertension: Secondary | ICD-10-CM | POA: Diagnosis not present

## 2023-09-01 DIAGNOSIS — Z6821 Body mass index (BMI) 21.0-21.9, adult: Secondary | ICD-10-CM | POA: Diagnosis not present

## 2023-09-01 DIAGNOSIS — Z8673 Personal history of transient ischemic attack (TIA), and cerebral infarction without residual deficits: Secondary | ICD-10-CM | POA: Diagnosis not present

## 2023-09-02 ENCOUNTER — Other Ambulatory Visit: Payer: Self-pay | Admitting: Internal Medicine

## 2023-09-02 DIAGNOSIS — M419 Scoliosis, unspecified: Secondary | ICD-10-CM

## 2023-09-02 DIAGNOSIS — H353131 Nonexudative age-related macular degeneration, bilateral, early dry stage: Secondary | ICD-10-CM | POA: Diagnosis not present

## 2023-09-02 DIAGNOSIS — H35363 Drusen (degenerative) of macula, bilateral: Secondary | ICD-10-CM | POA: Diagnosis not present

## 2023-09-02 DIAGNOSIS — H2513 Age-related nuclear cataract, bilateral: Secondary | ICD-10-CM | POA: Diagnosis not present

## 2023-09-02 DIAGNOSIS — C9110 Chronic lymphocytic leukemia of B-cell type not having achieved remission: Secondary | ICD-10-CM

## 2023-09-02 DIAGNOSIS — M545 Low back pain, unspecified: Secondary | ICD-10-CM

## 2023-09-02 DIAGNOSIS — H43811 Vitreous degeneration, right eye: Secondary | ICD-10-CM | POA: Diagnosis not present

## 2023-09-02 DIAGNOSIS — H43391 Other vitreous opacities, right eye: Secondary | ICD-10-CM | POA: Diagnosis not present

## 2023-09-02 DIAGNOSIS — H47323 Drusen of optic disc, bilateral: Secondary | ICD-10-CM | POA: Diagnosis not present

## 2023-09-08 DIAGNOSIS — M9903 Segmental and somatic dysfunction of lumbar region: Secondary | ICD-10-CM | POA: Diagnosis not present

## 2023-09-08 DIAGNOSIS — M62830 Muscle spasm of back: Secondary | ICD-10-CM | POA: Diagnosis not present

## 2023-09-08 DIAGNOSIS — M9902 Segmental and somatic dysfunction of thoracic region: Secondary | ICD-10-CM | POA: Diagnosis not present

## 2023-09-08 DIAGNOSIS — M4125 Other idiopathic scoliosis, thoracolumbar region: Secondary | ICD-10-CM | POA: Diagnosis not present

## 2023-09-08 DIAGNOSIS — M5442 Lumbago with sciatica, left side: Secondary | ICD-10-CM | POA: Diagnosis not present

## 2023-09-19 ENCOUNTER — Ambulatory Visit
Admission: RE | Admit: 2023-09-19 | Discharge: 2023-09-19 | Disposition: A | Discharge status: Home / Self Care | Source: Ambulatory Visit | Attending: Internal Medicine | Admitting: Internal Medicine

## 2023-09-19 DIAGNOSIS — M5126 Other intervertebral disc displacement, lumbar region: Secondary | ICD-10-CM | POA: Diagnosis not present

## 2023-09-19 DIAGNOSIS — M419 Scoliosis, unspecified: Secondary | ICD-10-CM

## 2023-09-19 DIAGNOSIS — C9110 Chronic lymphocytic leukemia of B-cell type not having achieved remission: Secondary | ICD-10-CM

## 2023-09-19 DIAGNOSIS — M47816 Spondylosis without myelopathy or radiculopathy, lumbar region: Secondary | ICD-10-CM | POA: Diagnosis not present

## 2023-09-19 DIAGNOSIS — M545 Low back pain, unspecified: Secondary | ICD-10-CM

## 2023-09-19 MED ORDER — GADOPICLENOL 0.5 MMOL/ML IV SOLN
5.0000 mL | Freq: Once | INTRAVENOUS | Status: AC | PRN
  Administered 2023-09-19: 5 mL via INTRAVENOUS

## 2023-09-22 DIAGNOSIS — M9903 Segmental and somatic dysfunction of lumbar region: Secondary | ICD-10-CM | POA: Diagnosis not present

## 2023-09-22 DIAGNOSIS — M62830 Muscle spasm of back: Secondary | ICD-10-CM | POA: Diagnosis not present

## 2023-09-22 DIAGNOSIS — M5442 Lumbago with sciatica, left side: Secondary | ICD-10-CM | POA: Diagnosis not present

## 2023-09-22 DIAGNOSIS — M4125 Other idiopathic scoliosis, thoracolumbar region: Secondary | ICD-10-CM | POA: Diagnosis not present

## 2023-09-22 DIAGNOSIS — M9902 Segmental and somatic dysfunction of thoracic region: Secondary | ICD-10-CM | POA: Diagnosis not present

## 2023-09-25 NOTE — Progress Notes (Signed)
 [facility]  [address] 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  She has been on observation only. She has had stable bilateral cervical and inguinal lymphadenopathy.  MRI imaging from February 2023 revealed extensive inguinal and retroperitoneal lymphadenopathy.  Her physical exam reveals a modest change in her cervical and supraclavicular adenopathy. PET scan in March of 2023 revealed continued stability of mild adenopathy in the neck, chest, abdomen and pelvis with low level FDG and Deauville 2-3 category uptake. The CT in September of 2023 shows the numerous mildly enlarged nodes to be unchanged. Her lymphadenopathy is relatively stable at this time but just mildly increased. Her CT chest, abdomen and pelvis done on 11/06/22 revealed stable bilateral axillary and supraclavicular adenopathy, no mediastinal lymphadenopathy, a new band of linear consolidation in the LEFT upper lobe, which is probably post infectious consolidation. She has stable retroperitoneal and proximal iliac lymphadenopathy, and no new adenopathy in the abdomen and pelvis.  There is a normal spleen and no skeletal lesions. She has progression of her lymphadenopathy by exam and may need treatment soon.    B12 deficiency anemia She was treated with B12 injections then transitioned to oral B12.  She is no longer taking B12 as her B12 level has been elevated at the [facility] office.  On May 11, B12 was greater than 2000, iron studies and folate were normal.  She remains off B12 as her last level in June was over 1,000. It dropped to 350 in March, 2024 and so I had advised her to get back on oral B-12 and the level was back over 1,000 in June, 2024. We will continue to monitor  this.  Pneumonia Left upper lobe, lobar pneumonia in May, 2024, has been treated with multiple antibiotics and is clinically improving. CT scan shows resolution with a band of linear consolidation in the left upper lobe.  CVA She had another stroke in December, 2024 and is now on Plavix  75 mg. She has seen the neurologist and they are working on better control of her blood pressure and cholesterol.    Plan: Her BP today is elevated at 148/92 repeat was 136/74, she continues Altace  2.5 mg BID, with this being one extra tablet than usual. She had a MRI of the lumbar spine done on 09/19/2023 which has not been resulted yet. She ha a WBC of 6.7, hemoglobin of 12.2, and platelet count of 163,000. Her CMP is completley normal. During physical exam I palpated multiple lymph nodes throughout her body which have increased in number and size. I informed her that I believe her lymphoma is getting worse. I will order a CT chest, abdomen, and pelvis soon. I will see her back in 3 weeks to review her results. The patient and her husband understand the plans discussed today and are in agreement with them.  She knows to contact our office if she develops concerns prior to her next appointment.  I provided 22 minutes of face-to-face time during this encounter and > 50% was spent counseling as documented under my assessment and plan.   Nolia Baumgartner, MD  [facility] - A DEPT OF [facility] [address] [place] [address] Dept: [phone] Dept Fax: [phone]   No  orders of the defined types were placed in this encounter.   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  Staging CT chest revealed adenopathy of the bilateral neck measuring up to 18 mm in  diameter with left subclavian, bilateral axillary and subpectoral adenopathy, but no mediastinal nodes.  CT abdomen was  negative.  CT pelvis revealed bilateral external iliac nodes up to 11 mm in diameter.  She had B symptoms with severe night sweats and weight loss.  She has been on  observation only.  Due to her family history of breast cancer, she underwent testing for hereditary breast and ovarian cancer with the My[employer] myRisk Hereditary Cancer Gene panel test.  This did not reveal any clinically significant mutation.  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  Her [facility] breast cancer risk assessment showed her lifetime risk of breast cancer to be 29.7%, so annual breast MRI, in addition to mammogram is recommended.  Mammogram and MRI breast done in August 2019 did not reveal any evidence of malignancy.  She did go for a second opinion to [facility] regarding her lymphoma and they concurred with the approach of watchful waiting.    She presented to the emergency room in mid April 2021 due to increased urinary frequency and discomfort.  CT imaging revealed left sided obstructive uropathy with 3 mm calculus in the distal left ureter just proximal to the ureterovesical unction causing moderate hydroureteronephrosis and perinephric stranding.  Left greater than right iliac and pelvic lymphadenopathy, slightly greater than on 10/26/18, consistent with known history of lymphoma.  We did not repeat a scan in June as scheduled.  Annual screening bilateral mammogram from August 2021 was clear.  MRI breast has not been repeated due to her comorbidities.   CT neck in May 2022 revealed multiple lymph nodes in the neck bilaterally, with mild progression of lymph nodes on the right. There has been more significant progression of left level 4 and left supraclavicular lymph nodes compared to the prior study. Largest lymph node in the left supraclavicular region measures 35 x 17 mm.  Findings were compatible with lymphoma. CT chest/abdomen/pelvis revealed no substantial interval change in exam. Bilateral supraclavicular, subpectoral, axillary, retroperitoneal, and pelvic lymphadenopathy was similar to prior. There were no definite findings of progression.  At her visit in September, the lymphoma remained stable. She continued to have sciatic pain unrelieved with gabapentin  800 mg TID. She had steroid injections of the spine with improvement.    Bilateral screening mammogram in January of 2023 revealed a possible asymmetry in the left breast.  She underwent left diagnostic mammogram and the asymmetry disperses with compression, so it is felt to be dense fibroglandular tissue.  There is no evidence of malignancy.  Bilateral screening in 1 year was recommended.  MRI of the right hip from February 2023 revealed extensive inguinal and retroperitoneal lymphadenopathy. There are high-grade tears of the right gluteus numbness greater than medius with background tendinosis and trochanteric bursitis, and bilateral hamstring origin tendinosis with mild partial tearing. She has mild bilateral hip osteoarthritis, and  right sided labral tear. She states that she has chronic palpable lymph nodes of the left neck and supraclavicular area.  She therefore underwent PET scan in March of 2023, which revealed stable disease when compared to previous  CT images.   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)  11/11/2015 Cancer Staging   Staging form: Hodgkin and Non-Hodgkin Lymphoma, AJCC 8th Edition - Clinical stage from 11/11/2015: Stage III (Small lymphocytic leukemia) - Signed by Nolia Baumgartner, MD on 01/28/2021 Histopathologic type: Malignant lymphoma, small B lymphocytic, NOS (see also M-9823/3) Stage prefix: Initial diagnosis Diagnostic confirmation: Positive histology PLUS positive immunophenotyping and/or positive genetic studies Specimen type: Core Needle  Biopsy Staged by: Managing physician Stage used in treatment planning: Yes National guidelines used in treatment planning: Yes Type of national guideline used in treatment planning: NCCN Staging comments: Watchful waiting   06/20/2020 Initial Diagnosis   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)   Chronic lymphocytic leukemia (CLL), B-cell (HCC)  10/31/2015 Initial Diagnosis   Chronic lymphocytic leukemia (CLL), B-cell (HCC)   11/11/2015 Cancer Staging   Staging form: Chronic Lymphocytic Leukemia / Small Lymphocytic Lymphoma, AJCC 8th Edition - Clinical stage from 11/11/2015: Modified Rai Stage III (Modified Rai risk: High, Binet: Stage B, Lugano: Stage III, Lymphocytosis: Absent, Adenopathy: Present, Organomegaly: Absent, Anemia: Absent, Thrombocytopenia: Absent) - Signed by Nolia Baumgartner, MD on 04/05/2021 Histopathologic type: B-cell lymphocytic leukemia/small lymphocytic lymphoma (see also M-9670/3) Stage prefix: Initial diagnosis Stage used in treatment planning: Yes National guidelines used in treatment planning: Yes Type of national guideline used in treatment planning: NCCN       INTERVAL HISTORY:  Cheney  is here today for repeat clinical assessment for small cell B cell lymphoma. She has never required treatment for this but she does have slow progression of her lymphadenopathy and lymphocytosis. Patient states that she feels poorly and complains of a lingering cough that worsens at night, fatigue, mild light headedness, and left hip/leg pain rating 4/10. Her BP today is elevated at 148/92 repeat was 136/74, she continues Altace  2.5 mg BID, with this being one extra tablet than usual. She had a MRI of the lumbar spine done on 09/19/2023 which has not been resulted yet. She ha a WBC of 6.7, hemoglobin of 12.2, and platelet count of 163,000. Her CMP is completley normal. During physical exam I palpated multiple lymph nodes throughout her body which have  increased in number and size. I informed her that I believe her lymphoma is getting worse. I will order a CT chest, abdomen, and pelvis soon. I will see her back in 3 weeks to review her results. She denies fever, chills, night sweats, or other signs of infection. She denies cardiorespiratory and gastrointestinal issues. She  denies pain. Her appetite is good and her weight has decreased 2 pounds over last 3 months. This patient is accompanied in the office by her husband.    REVIEW OF SYSTEMS:  Review of Systems  Constitutional:  Positive for fatigue. Negative for appetite change, chills, diaphoresis, fever and unexpected weight change.  HENT:  Negative.  Negative for hearing loss, lump/mass, mouth sores, nosebleeds, sore throat, tinnitus, trouble swallowing and voice change.   Eyes: Negative.  Negative for eye problems and icterus.  Respiratory:  Positive for cough (worse at night). Negative for chest tightness, hemoptysis, shortness of breath and wheezing.   Cardiovascular:  Negative for chest pain, leg swelling and palpitations.  Gastrointestinal:  Negative for abdominal distention, abdominal pain, blood in stool, constipation, diarrhea, nausea, rectal pain and vomiting.  Endocrine: Negative.   Genitourinary:  Negative for bladder incontinence, difficulty urinating, dyspareunia, dysuria, frequency, hematuria, menstrual problem, nocturia, pelvic pain, vaginal bleeding  and vaginal discharge.   Musculoskeletal:  Positive for arthralgias (right shoulder improved but still hurts). Negative for back pain, flank pain, gait problem, myalgias, neck pain and neck stiffness.       Left hip/leg pain rating 4/10  Skin: Negative.  Negative for itching, rash and wound.  Neurological:  Positive for headaches, light-headedness (mild) and speech difficulty (in december, transient with CVA). Negative for dizziness, extremity weakness, gait problem, numbness and seizures.  Hematological:  Negative for adenopathy.  Bruises/bleeds easily.  Psychiatric/Behavioral:  Negative for confusion, decreased concentration, depression, sleep disturbance and suicidal ideas. The patient is not nervous/anxious.     VITALS:  Blood pressure 136/74, pulse 67, temperature 97.6 F (36.4 C), temperature source Oral, resp. rate 16, height 5' 0.24" (1.53 m), weight 113 lb 11.2 oz (51.6 kg), SpO2 100%.  Wt Readings from Last 3 Encounters:  09/30/23 113 lb 11.2 oz (51.6 kg)  08/18/23 115 lb 12.8 oz (52.5 kg)  07/01/23 115 lb 9.6 oz (52.4 kg)    Body mass index is 22.03 kg/m.  Performance status (ECOG): 1 - Symptomatic but completely ambulatory  PHYSICAL EXAM:  Physical Exam Vitals and nursing note reviewed. Exam conducted with a chaperone present.  Constitutional:      General: She is not in acute distress.    Appearance: Normal appearance. She is normal weight. She is not ill-appearing, toxic-appearing or diaphoretic.  HENT:     Head: Normocephalic and atraumatic.     Right Ear: Tympanic membrane, ear canal and external ear normal. There is no impacted cerumen.     Left Ear: Tympanic membrane, ear canal and external ear normal. There is no impacted cerumen.     Nose: Nose normal. No congestion or rhinorrhea.     Mouth/Throat:     Mouth: Mucous membranes are moist.     Pharynx: Oropharynx is clear. No oropharyngeal exudate or posterior oropharyngeal erythema.  Eyes:     General: No scleral icterus.       Right eye: No discharge.        Left eye: No discharge.     Extraocular Movements: Extraocular movements intact.     Conjunctiva/sclera: Conjunctivae normal.     Pupils: Pupils are equal, round, and reactive to light.  Neck:     Vascular: No carotid bruit.  Cardiovascular:     Rate and Rhythm: Normal rate and regular rhythm.     Pulses: Normal pulses.     Heart sounds: Normal heart sounds. No murmur heard.    No friction rub. No gallop.  Pulmonary:     Effort: Pulmonary effort is normal. No respiratory  distress.     Breath sounds: Normal breath sounds. No stridor. No wheezing, rhonchi or rales.  Chest:     Chest wall: No tenderness.  Abdominal:     General: Bowel sounds are normal. There is no distension.     Palpations: Abdomen is soft. There is no hepatomegaly, splenomegaly or mass.     Tenderness: There is no abdominal tenderness. There is no right CVA tenderness, left CVA tenderness, guarding or rebound.     Hernia: No hernia is present.  Musculoskeletal:        General: No swelling, tenderness, deformity or signs of injury. Normal range of motion.     Cervical back: Normal range of motion and neck supple. No rigidity or tenderness.     Right lower leg: No edema.     Left lower leg: No edema.  Lymphadenopathy:  Head:     Right side of head: Submandibular adenopathy present.     Cervical: Cervical adenopathy present.     Left cervical: Posterior cervical adenopathy present.     Upper Body:     Right upper body: Supraclavicular adenopathy and axillary adenopathy present. No pectoral adenopathy.     Left upper body: Supraclavicular adenopathy and axillary adenopathy present. No pectoral adenopathy.     Lower Body: Right inguinal adenopathy present. Left inguinal adenopathy present.     Comments: 1cm node in the left anterior neck Another node in the right submandibular 1cm in the midline submandibular area She has some scattered nodes in the bilateral cervical area and bilateral supraclavicular area, that is up to 1.5cm on the left but most are about 1cm Left posterior cervical node is 1-2cm with another one that is about 1.5cm 2-cm node in the left axilla 1cm node high in the right axilla Several other nodes in the right axilla 2cm node in the left inguinal area Adenopathy in the right inguinal area  Skin:    General: Skin is warm and dry.     Coloration: Skin is not jaundiced or pale.     Findings: No bruising, erythema, lesion or rash.  Neurological:     General: No  focal deficit present.     Mental Status: She is alert and oriented to person, place, and time. Mental status is at baseline.     Cranial Nerves: No cranial nerve deficit.     Sensory: No sensory deficit.     Motor: No weakness.     Coordination: Coordination normal.     Gait: Gait normal.     Deep Tendon Reflexes: Reflexes normal.  Psychiatric:        Mood and Affect: Mood normal.        Behavior: Behavior normal.        Thought Content: Thought content normal.        Judgment: Judgment normal.     LABS:      Latest Ref Rng & Units 09/30/2023    9:12 AM 07/01/2023    8:58 AM 02/26/2023    8:31 AM  CBC  WBC 4.0 - 10.5 K/uL 6.7  5.0  6.0   Hemoglobin 12.0 - 15.0 g/dL 40.9  81.1  91.4   Hematocrit 36.0 - 46.0 % 37.0  36.5  39.9   Platelets 150 - 400 K/uL 163  157  197       Latest Ref Rng & Units 09/30/2023    9:12 AM 07/01/2023    8:58 AM 02/26/2023    8:31 AM  CMP  Glucose 70 - 99 mg/dL 782  956  213   BUN 8 - 23 mg/dL 13  13  16    Creatinine 0.44 - 1.00 mg/dL 0.86  5.78  4.69   Sodium 135 - 145 mmol/L 140  140  139   Potassium 3.5 - 5.1 mmol/L 4.5  4.2  4.4   Chloride 98 - 111 mmol/L 101  104  104   CO2 22 - 32 mmol/L 27  28  27    Calcium  8.9 - 10.3 mg/dL 9.6  9.4  9.1   Total Protein 6.5 - 8.1 g/dL 6.7  6.7  7.3   Total Bilirubin 0.0 - 1.2 mg/dL 0.4  0.2  0.2   Alkaline Phos 38 - 126 U/L 69  63  49   AST 15 - 41 U/L 36  25  22  ALT 0 - 44 U/L 42  19  20    Lab Results  Component Value Date   TSH 3.350 02/17/2023   T4TOTAL 6.9 02/17/2023   No results found for: "CEA1", "CEA" / No results found for: "CEA1", "CEA" No results found for: "PSA1" No results found for: "AVW098" No results found for: "CAN125"  No results found for: "TOTALPROTELP", "ALBUMINELP", "A1GS", "A2GS", "BETS", "BETA2SER", "GAMS", "MSPIKE", "SPEI" Lab Results  Component Value Date   TIBC 322 12/12/2022   TIBC 331 07/23/2021   FERRITIN 19 12/12/2022   FERRITIN 22 07/23/2021   IRONPCTSAT 30  12/12/2022   IRONPCTSAT 20 07/23/2021   Lab Results  Component Value Date   LDH 162 07/01/2023   LDH 136 10/30/2021   LDH 142 07/20/2021   Lab Results  Component Value Date   VITAMINB12 533 07/01/2023   STUDIES:   EXAM: 06/27/2023 MR angiogram of the intracranial arteries  IMPRESSION:  This MR angiogram of the head shows the following: 1.  Moderate stenosis in the P2 segment and mild stenosis in the P1 segment of the left posterior cerebral artery.  Will distal flow appeared normal.  The stenosis was not noted on the CT angiogram from 02/09/2021. 2.  Normal flow noted in other intracranial arteries.  EXAM: 06/27/2023 MR Angiogram of the Neck Arteries with and without contrast  IMPRESSION: This is a normal MR angiogram of the neck arteries with and without contrast   EXAM: 06/23/2023 DIGITAL SCREENING BILATERAL MAMMOGRAM WITH TOMOSYNTHESIS AND CAD IMPRESSION: No mammographic evidence of malignancy.    HISTORY:   Past Medical History:  Diagnosis Date   Allergy    seasonal   Arthritis    Bipolar 1 disorder (HCC)    Cancer (HCC)    non hodgkins lymphoma   Depression    GERD (gastroesophageal reflux disease)    History of degenerative disc disease    Hyperlipidemia    Increased risk of breast cancer 04/25/2021   Obstructive sleep apnea    Osteopenia    Scoliosis    Thyroid  disease    hypothyroidism   Urticaria    Vitamin D deficiency     Past Surgical History:  Procedure Laterality Date   BREAST BIOPSY     BUBBLE STUDY  02/13/2021   Procedure: BUBBLE STUDY;  Surgeon: Maudine Sos, MD;  Location: [facility] ENDOSCOPY;  Service: Cardiovascular;;   CARPAL TUNNEL RELEASE Bilateral 2002   CESAREAN SECTION     x2   COLONOSCOPY  07/26/2008   Melanosis coli. Small internal hemorrhoids.    ENDOSCOPIC PLANTAR FASCIOTOMY     ESOPHAGOGASTRODUODENOSCOPY  03/17/2013   Mild gastritis. Status post esophageal dilatation.   LYMPH NODE BIOPSY     right hip repair torn  tendon Right 09/2021   TEE WITHOUT CARDIOVERSION N/A 02/13/2021   Procedure: TRANSESOPHAGEAL ECHOCARDIOGRAM (TEE);  Surgeon: Maudine Sos, MD;  Location: [facility] ENDOSCOPY;  Service: Cardiovascular;  Laterality: N/A;   WISDOM TOOTH EXTRACTION      Family History  Problem Relation Age of Onset   Prostate cancer Father 63   Melanoma Father 91   High blood pressure Father    Breast cancer Maternal Aunt    Multiple myeloma Maternal Aunt    Breast cancer Maternal Aunt    Colon cancer Neg Hx    Colon polyps Neg Hx    Esophageal cancer Neg Hx    Rectal cancer Neg Hx    Stomach cancer Neg Hx     Social History:  reports that she has never smoked. She has never used smokeless tobacco. She reports that she does not currently use alcohol. She reports that she does not use drugs.The patient is accompanied by her husband today.  Allergies:  Allergies  Allergen Reactions   Diclofenac Sodium     Thought it had something to do with her having a stroke   Voltaren [Diclofenac Sodium]     Thought it had something to do with her having a stroke   Nsaids     Had a stroke and worries it was related to the Diclofenac she took.    Current Medications: Current Outpatient Medications  Medication Sig Dispense Refill   atorvastatin  (LIPITOR) 80 MG tablet Take 1 tablet (80 mg total) by mouth daily. 30 tablet 5   cetirizine (ZYRTEC) 10 MG tablet Take 10 mg by mouth 2 (two) times daily.     estradiol (ESTRACE) 0.1 MG/GM vaginal cream 2 (two) times a week.     famotidine  (PEPCID ) 20 MG tablet Take 1 tablet (20 mg total) by mouth 2 (two) times daily. 60 tablet 5   FLUoxetine  HCl (PROZAC  PO) Take 60 mg by mouth daily.     Multiple Vitamin (MULTIVITAMIN ADULT PO) Take by mouth. Vision MD once a day     PLAVIX  75 MG tablet Take 75 mg by mouth daily.     Probiotic Product (PROBIOTIC BLEND PO) Take by mouth at bedtime.     ramipril  (ALTACE ) 2.5 MG capsule Take by mouth. 1 in am in 1 in pm     No current  facility-administered medications for this visit.    I,[name],acting as a scribe for Nolia Baumgartner, MD.,have documented all relevant documentation on the behalf of Nolia Baumgartner, MD,as directed by  Nolia Baumgartner, MD while in the presence of Nolia Baumgartner, MD.

## 2023-09-30 ENCOUNTER — Encounter: Payer: Self-pay | Admitting: Oncology

## 2023-09-30 ENCOUNTER — Inpatient Hospital Stay: Payer: Medicare Other

## 2023-09-30 ENCOUNTER — Other Ambulatory Visit: Payer: Self-pay | Admitting: Oncology

## 2023-09-30 ENCOUNTER — Inpatient Hospital Stay: Payer: Medicare Other | Attending: Oncology | Admitting: Oncology

## 2023-09-30 VITALS — BP 136/74 | HR 67 | Temp 97.6°F | Resp 16 | Ht 60.24 in | Wt 113.7 lb

## 2023-09-30 DIAGNOSIS — C8302 Small cell B-cell lymphoma, intrathoracic lymph nodes: Secondary | ICD-10-CM

## 2023-09-30 DIAGNOSIS — C9110 Chronic lymphocytic leukemia of B-cell type not having achieved remission: Secondary | ICD-10-CM | POA: Insufficient documentation

## 2023-09-30 DIAGNOSIS — Z79899 Other long term (current) drug therapy: Secondary | ICD-10-CM | POA: Insufficient documentation

## 2023-09-30 DIAGNOSIS — D519 Vitamin B12 deficiency anemia, unspecified: Secondary | ICD-10-CM | POA: Insufficient documentation

## 2023-09-30 LAB — CBC WITH DIFFERENTIAL (CANCER CENTER ONLY)
Abs Immature Granulocytes: 0.02 10*3/uL (ref 0.00–0.07)
Basophils Absolute: 0 10*3/uL (ref 0.0–0.1)
Basophils Relative: 0 %
Eosinophils Absolute: 0.1 10*3/uL (ref 0.0–0.5)
Eosinophils Relative: 1 %
HCT: 37 % (ref 36.0–46.0)
Hemoglobin: 12.2 g/dL (ref 12.0–15.0)
Immature Granulocytes: 0 %
Lymphocytes Relative: 69 %
Lymphs Abs: 4.6 10*3/uL — ABNORMAL HIGH (ref 0.7–4.0)
MCH: 33.6 pg (ref 26.0–34.0)
MCHC: 33 g/dL (ref 30.0–36.0)
MCV: 101.9 fL — ABNORMAL HIGH (ref 80.0–100.0)
Monocytes Absolute: 0.5 10*3/uL (ref 0.1–1.0)
Monocytes Relative: 8 %
Neutro Abs: 1.5 10*3/uL — ABNORMAL LOW (ref 1.7–7.7)
Neutrophils Relative %: 22 %
Platelet Count: 163 10*3/uL (ref 150–400)
RBC: 3.63 MIL/uL — ABNORMAL LOW (ref 3.87–5.11)
RDW: 14.6 % (ref 11.5–15.5)
WBC Count: 6.7 10*3/uL (ref 4.0–10.5)
nRBC: 0 % (ref 0.0–0.2)
nRBC: 0 /100{WBCs}

## 2023-09-30 LAB — CMP (CANCER CENTER ONLY)
ALT: 42 U/L (ref 0–44)
AST: 36 U/L (ref 15–41)
Albumin: 3.9 g/dL (ref 3.5–5.0)
Alkaline Phosphatase: 69 U/L (ref 38–126)
Anion gap: 11 (ref 5–15)
BUN: 13 mg/dL (ref 8–23)
CO2: 27 mmol/L (ref 22–32)
Calcium: 9.6 mg/dL (ref 8.9–10.3)
Chloride: 101 mmol/L (ref 98–111)
Creatinine: 0.85 mg/dL (ref 0.44–1.00)
GFR, Estimated: >60 mL/min (ref >60)
Glucose, Bld: 104 mg/dL — ABNORMAL HIGH (ref 70–99)
Potassium: 4.5 mmol/L (ref 3.5–5.1)
Sodium: 140 mmol/L (ref 135–145)
Total Bilirubin: 0.4 mg/dL (ref 0.0–1.2)
Total Protein: 6.7 g/dL (ref 6.5–8.1)

## 2023-10-02 ENCOUNTER — Ambulatory Visit (HOSPITAL_BASED_OUTPATIENT_CLINIC_OR_DEPARTMENT_OTHER)
Admission: RE | Admit: 2023-10-02 | Discharge: 2023-10-02 | Disposition: A | Discharge status: Home / Self Care | Source: Ambulatory Visit | Attending: Oncology | Admitting: Oncology

## 2023-10-02 DIAGNOSIS — R590 Localized enlarged lymph nodes: Secondary | ICD-10-CM | POA: Diagnosis not present

## 2023-10-02 DIAGNOSIS — C8302 Small cell B-cell lymphoma, intrathoracic lymph nodes: Secondary | ICD-10-CM | POA: Diagnosis not present

## 2023-10-02 MED ORDER — IOHEXOL 300 MG/ML  SOLN
100.0000 mL | Freq: Once | INTRAMUSCULAR | Status: AC | PRN
  Administered 2023-10-02: 100 mL via INTRAVENOUS

## 2023-10-10 ENCOUNTER — Encounter: Payer: Self-pay | Admitting: Oncology

## 2023-10-10 NOTE — Progress Notes (Signed)
 [facility]  [address] 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  She has been on observation only. She has had stable bilateral cervical and inguinal lymphadenopathy.  MRI imaging from February 2023 revealed extensive inguinal and retroperitoneal lymphadenopathy.  Her physical exam reveals a modest change in her cervical and supraclavicular adenopathy. PET scan in March of 2023 revealed continued stability of mild adenopathy in the neck, chest, abdomen and pelvis with low level FDG and Deauville 2-3 category uptake. The CT in September of 2023 shows the numerous mildly enlarged nodes to be unchanged. Her lymphadenopathy is relatively stable at this time but just mildly increased. Her CT chest, abdomen and pelvis done on 11/06/22 revealed stable bilateral axillary and supraclavicular adenopathy, no mediastinal lymphadenopathy, a new band of linear consolidation in the LEFT upper lobe, which is probably post infectious consolidation. She has stable retroperitoneal and proximal iliac lymphadenopathy, and no new adenopathy in the abdomen and pelvis.  There is a normal spleen and no skeletal lesions. She has progression of her lymphadenopathy by exam and by CT scan.  We have therefore discussed treatment.  Initially I did talk about a Bruton kinase inhibitor but there is an increased risk of bleeding and she is on Plavix  for prior strokes.  We therefore feel venetoclax would be a better choice and she will have chemotherapy education tomorrow.  We will start with a very slowly escalating dose of 20 mg daily for 1 week, 50 mg daily for 1 week, 100 mg daily for 1 week and then 200 mg daily.  She will need labs at least once or twice weekly including phosphorus and uric  acid and I will get a baseline of those when she comes in tomorrow in addition to LDH TSH and quantitative immunoglobulins.   B12 deficiency anemia She was treated with B12 injections then transitioned to oral B12.  She is no longer taking B12 as her B12 level has been elevated at the [facility].  On May 11, B12 was greater than 2000, iron studies and folate were normal.  She remains off B12 as her last level in June was over 1,000. It dropped to 350 in March, 2024 and so I had advised her to get back on oral B-12 and the level was back over 1,000 in June, 2024. We will continue to monitor this.  Pneumonia Left upper lobe, lobar pneumonia in May, 2024, has been treated with multiple antibiotics and is clinically improving. CT scan shows resolution with a band of linear consolidation in the left upper lobe.  CVA She had another stroke in December, 2024 and is now on Plavix  75 mg. She has seen the neurologist and they are working on better control of her blood pressure and cholesterol.    Plan: She is here today for repeat clinical assessment for small cell B cell lymphoma, diagnosed in June, 2017. She has never required treatment for this but she does have slow progression of her lymphadenopathy and lymphocytosis. Patient states that she feels ok but complains of lower back pain, left hip, and leg pain rating 3/10. She had a CT chest, abdomen, and pelvis done on 10/02/2023 which revealed increased size of retroperitoneal, iliac side chain, pelvic sidewall and inguinal lymph nodes with new prominent thoracic inlet/upper  mediastinal lymph nodes which are compatible with worsening lymphoma. However, an overall stable prominent bilateral supraclavicular and axillary adenopathy, no splenomegaly, and colonic stool burden compatible with constipation. After reviewing the report it seems that some lesions have slightly increased and some have increased. Due to this I explained she has the option of starting  therapy, we discussed some of the options and I originally considered single agent bruton-kinase inhibitor.  However there is an increased risk of bleeding complications with any of these agents and she is already on Plavix  and over 54 years old, so a safer approach might be venetoclax.  She will have a formal education with the pharmacist Kaitlyn tomorrow.  We will start with a very slowly escalating dose of 20 mg daily for 1 week, 50 mg daily for 1 week, 100 mg daily for 1 week and then 200 mg daily.  She will need labs at least once or twice weekly including phosphorus and uric acid and I will get a baseline of those when she comes in tomorrow in addition to LDH TSH and quantitative immunoglobulins.  If this is not sufficient for response, we can add in a monoclonal antibody, but she has fairly indolent disease and so I feel the oral agent should be sufficient.  Next week she will have 2 lab appointments and then I will see her back in 2 weeks with CBC and CMP.  We will continue frequent follow-up with weekly labs as she escalates her dose. The patient and her husband understand the plans discussed today and are in agreement with them.  She knows to contact our office if she develops concerns prior to her next appointment.  I provided 32 minutes of face-to-face time during this encounter and > 50% was spent counseling as documented under my assessment and plan.   Dawn Baumgartner, MD  [facility] - A DEPT OF [facility] [address] [place] [address] Dept: [phone] Dept Fax: [phone]   No orders of the defined types were placed in this encounter.  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  Staging CT chest revealed adenopathy of the  bilateral neck measuring up to 18 mm in diameter with left subclavian, bilateral axillary and subpectoral adenopathy, but no mediastinal nodes.  CT abdomen was  negative.  CT pelvis revealed bilateral external iliac nodes up to 11 mm in diameter.  She had B symptoms with severe night sweats and weight loss.  She has been on  observation only.  Due to her family history of breast cancer, she underwent testing for hereditary breast and ovarian cancer with the [employer] myRisk Hereditary Cancer Gene panel test.  This did not reveal any clinically significant mutation.  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  Her [facility] breast cancer risk assessment showed her lifetime risk of breast cancer to be 29.7%, so annual breast MRI, in addition to mammogram is recommended.  Mammogram and MRI breast done in August 2019 did not reveal any evidence of malignancy.  She did go for a second opinion to [facility] regarding her lymphoma and they concurred with the approach of watchful waiting.    She presented to the emergency room in mid April 2021 due to increased urinary  frequency and discomfort.  CT imaging revealed left sided obstructive uropathy with 3 mm calculus in the distal left ureter just proximal to the ureterovesical unction causing moderate hydroureteronephrosis and perinephric stranding.  Left greater than right iliac and pelvic lymphadenopathy, slightly greater than on 10/26/18, consistent with known history of lymphoma.  We did not repeat a scan in June as scheduled.  Annual screening bilateral mammogram from August 2021 was clear.  MRI breast has not been repeated due to her comorbidities.   CT neck in May 2022 revealed multiple lymph nodes in the neck bilaterally, with mild progression of lymph nodes on the right. There has been more significant progression of left level 4 and left supraclavicular lymph nodes compared to the prior study. Largest lymph node in the left  supraclavicular region measures 35 x 17 mm. Findings were compatible with lymphoma. CT chest/abdomen/pelvis revealed no substantial interval change in exam. Bilateral supraclavicular, subpectoral, axillary, retroperitoneal, and pelvic lymphadenopathy was similar to prior. There were no definite findings of progression.  At her visit in September, the lymphoma remained stable. She continued to have sciatic pain unrelieved with gabapentin  800 mg TID. She had steroid injections of the spine with improvement.    Bilateral screening mammogram in January of 2023 revealed a possible asymmetry in the left breast.  She underwent left diagnostic mammogram and the asymmetry disperses with compression, so it is felt to be dense fibroglandular tissue.  There is no evidence of malignancy.  Bilateral screening in 1 year was recommended.  MRI of the right hip from February 2023 revealed extensive inguinal and retroperitoneal lymphadenopathy. There are high-grade tears of the right gluteus numbness greater than medius with background tendinosis and trochanteric bursitis, and bilateral hamstring origin tendinosis with mild partial tearing. She has mild bilateral hip osteoarthritis, and  right sided labral tear. She states that she has chronic palpable lymph nodes of the left neck and supraclavicular area.  She therefore underwent PET scan in March of 2023, which revealed stable disease when compared to previous CT images.  After 8 years of follow-up, she had significant progression of disease in May 2025 and so will be placed on treatment with venetoclax.  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)  11/11/2015 Cancer Staging   Staging form: Hodgkin and Non-Hodgkin Lymphoma, AJCC 8th Edition - Clinical stage from 11/11/2015: Stage III (Small lymphocytic leukemia) - Signed by Dawn Baumgartner, MD on 01/28/2021 Histopathologic type: Malignant lymphoma, small B lymphocytic, NOS (see also  [id number]/3) Stage prefix: Initial diagnosis Diagnostic confirmation: Positive histology PLUS positive immunophenotyping and/or positive genetic studies Specimen type: Core Needle Biopsy Staged by: Managing physician Stage used in treatment planning: Yes National guidelines used in treatment planning: Yes Type of national guideline used in treatment planning: NCCN Staging comments: Watchful waiting   06/20/2020 Initial Diagnosis   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)   Chronic lymphocytic leukemia (CLL), B-cell (HCC)  10/31/2015 Initial Diagnosis   Chronic lymphocytic leukemia (CLL), B-cell (HCC)   11/11/2015 Cancer Staging   Staging form: Chronic Lymphocytic Leukemia / Small Lymphocytic Lymphoma, AJCC 8th Edition - Clinical stage from 11/11/2015: Modified Rai Stage III (Modified Rai risk: High, Binet: Stage B, Lugano: Stage III, Lymphocytosis: Absent, Adenopathy: Present, Organomegaly: Absent, Anemia: Absent, Thrombocytopenia: Absent) - Signed by Dawn Baumgartner, MD on 04/05/2021 Histopathologic type: B-cell lymphocytic leukemia/small lymphocytic lymphoma (see also M-9670/3) Stage prefix: Initial diagnosis Stage used in treatment planning: Yes National guidelines used in  treatment planning: Yes Type of national guideline used in treatment planning: NCCN     INTERVAL HISTORY:  Dawn Prince  is here today for repeat clinical assessment for small cell B cell lymphoma, diagnosed in June,  2017. She has never required treatment for this but she does have slow progression of her lymphadenopathy and lymphocytosis. Patient states that she feels ok but complains of lower back pain, left hip, and leg pain rating 3/10. She had a CT chest, abdomen, and pelvis done on 10/02/2023 which revealed increased size of retroperitoneal, iliac side chain, pelvic sidewall and inguinal lymph nodes with new prominent thoracic inlet/upper mediastinal lymph nodes which are compatible with worsening  lymphoma. However, an overall stable prominent bilateral supraclavicular and axillary adenopathy, no splenomegaly, and colonic stool burden compatible with constipation. After reviewing the report it seems that some lesions have slightly increased and some have increased. Due to this I explained she has the option of starting therapy, we discussed some of the options and I originally considered single agent bruton-kinase inhibitor.  However there is an increased risk of bleeding complications with any of these agents and she is already on Plavix  and over 33 years old, so a safer approach might be venetoclax.  She will have a formal education with the pharmacist Kaitlyn tomorrow.  We will start with a very slowly escalating dose of 20 mg daily for 1 week, 50 mg daily for 1 week, 100 mg daily for 1 week and then 200 mg daily.  She will need labs at least once or twice weekly including phosphorus and uric acid and I will get a baseline of those when she comes in tomorrow in addition to LDH TSH and quantitative immunoglobulins.  If this is not sufficient for response, we can add in a monoclonal antibody, but she has fairly indolent disease and so I feel the oral agent should be sufficient.  Next week she will have 2 lab appointments and then I will see her back in 2 weeks with CBC and CMP.  We will continue frequent follow-up with weekly labs as she escalates her dose. She denies fever, chills, night sweats, or other signs of infection. She denies cardiorespiratory and gastrointestinal issues. She  denies pain. Her appetite is good and Her weight has been stable. This patient is accompanied in the office by her husband.   REVIEW OF SYSTEMS:  Review of Systems  Constitutional:  Positive for fatigue. Negative for appetite change, chills, diaphoresis, fever and unexpected weight change.  HENT:  Negative.  Negative for hearing loss, lump/mass, mouth sores, nosebleeds, sore throat, tinnitus, trouble swallowing and voice  change.   Eyes: Negative.  Negative for eye problems and icterus.  Respiratory:  Negative for chest tightness, cough, hemoptysis, shortness of breath and wheezing.   Cardiovascular:  Negative for chest pain, leg swelling and palpitations.  Gastrointestinal:  Positive for nausea (occasional). Negative for abdominal distention, abdominal pain, blood in stool, constipation, diarrhea, rectal pain and vomiting.  Endocrine: Negative.   Genitourinary:  Negative for bladder incontinence, difficulty urinating, dyspareunia, dysuria, frequency, hematuria, menstrual problem, nocturia, pelvic pain, vaginal bleeding and vaginal discharge.   Musculoskeletal:  Positive for arthralgias (right shoulder improved but still hurts) and back pain (lower3/10). Negative for flank pain, gait problem, myalgias, neck pain and neck stiffness.       Left hip pain and leg pain, 3/10  Skin: Negative.  Negative for itching, rash and wound.  Neurological:  Positive for speech difficulty (in december, transient with  CVA). Negative for dizziness, extremity weakness, gait problem, headaches, light-headedness, numbness and seizures.  Hematological:  Negative for adenopathy. Bruises/bleeds easily.  Psychiatric/Behavioral:  Positive for sleep disturbance. Negative for confusion, decreased concentration, depression and suicidal ideas. The patient is not nervous/anxious.     VITALS:  Blood pressure 133/79, pulse 69, temperature 97.9 F (36.6 C), temperature source Oral, resp. rate 18, height 5' 0.24" (1.53 m), weight 113 lb 6.4 oz (51.4 kg), SpO2 100%.  Wt Readings from Last 3 Encounters:  10/14/23 113 lb 6.4 oz (51.4 kg)  09/30/23 113 lb 11.2 oz (51.6 kg)  08/18/23 115 lb 12.8 oz (52.5 kg)    Body mass index is 21.97 kg/m.  Performance status (ECOG): 1 - Symptomatic but completely ambulatory  PHYSICAL EXAM:  Physical Exam Vitals and nursing note reviewed. Exam conducted with a chaperone present.  Constitutional:      General:  She is not in acute distress.    Appearance: Normal appearance. She is normal weight. She is not ill-appearing, toxic-appearing or diaphoretic.  HENT:     Head: Normocephalic and atraumatic.     Right Ear: Tympanic membrane, ear canal and external ear normal. There is no impacted cerumen.     Left Ear: Tympanic membrane, ear canal and external ear normal. There is no impacted cerumen.     Nose: Nose normal. No congestion or rhinorrhea.     Mouth/Throat:     Mouth: Mucous membranes are moist.     Pharynx: Oropharynx is clear. No oropharyngeal exudate or posterior oropharyngeal erythema.  Eyes:     General: No scleral icterus.       Right eye: No discharge.        Left eye: No discharge.     Extraocular Movements: Extraocular movements intact.     Conjunctiva/sclera: Conjunctivae normal.     Pupils: Pupils are equal, round, and reactive to light.  Neck:     Vascular: No carotid bruit.     Comments:   Cardiovascular:     Rate and Rhythm: Normal rate and regular rhythm.     Pulses: Normal pulses.     Heart sounds: Normal heart sounds. No murmur heard.    No friction rub. No gallop.  Pulmonary:     Effort: Pulmonary effort is normal. No respiratory distress.     Breath sounds: Normal breath sounds. No stridor. No wheezing, rhonchi or rales.  Chest:     Chest wall: No tenderness.  Abdominal:     General: Bowel sounds are normal. There is no distension.     Palpations: Abdomen is soft. There is no hepatomegaly, splenomegaly or mass.     Tenderness: There is no abdominal tenderness. There is no right CVA tenderness, left CVA tenderness, guarding or rebound.     Hernia: No hernia is present.  Musculoskeletal:        General: No swelling, tenderness, deformity or signs of injury. Normal range of motion.     Cervical back: Normal range of motion and neck supple. No rigidity or tenderness.     Right lower leg: No edema.     Left lower leg: No edema.  Lymphadenopathy:     Cervical:  Cervical adenopathy present.     Right cervical: Superficial cervical adenopathy and posterior cervical adenopathy present.     Left cervical: Posterior cervical adenopathy present.     Upper Body:     Right upper body: Supraclavicular adenopathy and axillary adenopathy present. No pectoral adenopathy.  Left upper body: Supraclavicular adenopathy and axillary adenopathy present. No pectoral adenopathy.     Lower Body: Right inguinal adenopathy present. Left inguinal adenopathy present.     Comments: 2cm left posterior cervical node. Several bilateral 1cm cervical nodes. Bilateral supraclavicular nodes also mostly 1cm.  Left axillary node which is 2-3cm.  Three nodes in the right axilla that measure about 1cm.  2cm node in the left inguinal area and additional node lateral to that.   Skin:    General: Skin is warm and dry.     Coloration: Skin is not jaundiced or pale.     Findings: No bruising, erythema, lesion or rash.  Neurological:     General: No focal deficit present.     Mental Status: She is alert and oriented to person, place, and time. Mental status is at baseline.     Cranial Nerves: No cranial nerve deficit.     Sensory: No sensory deficit.     Motor: No weakness.     Coordination: Coordination normal.     Gait: Gait normal.     Deep Tendon Reflexes: Reflexes normal.  Psychiatric:        Mood and Affect: Mood normal.        Behavior: Behavior normal.        Thought Content: Thought content normal.        Judgment: Judgment normal.    LABS:      Latest Ref Rng & Units 10/15/2023   10:23 AM 09/30/2023    9:12 AM 07/01/2023    8:58 AM  CBC  WBC 4.0 - 10.5 K/uL 7.0  6.7  5.0   Hemoglobin 12.0 - 15.0 g/dL 16.1  09.6  04.5   Hematocrit 36.0 - 46.0 % 35.0  37.0  36.5   Platelets 150 - 400 K/uL 139  163  157       Latest Ref Rng & Units 10/15/2023   10:23 AM 09/30/2023    9:12 AM 07/01/2023    8:58 AM  CMP  Glucose 70 - 99 mg/dL 409  811  914   BUN 8 - 23 mg/dL 19  13   13    Creatinine 0.44 - 1.00 mg/dL 7.82  9.56  2.13   Sodium 135 - 145 mmol/L 138  140  140   Potassium 3.5 - 5.1 mmol/L 4.5  4.5  4.2   Chloride 98 - 111 mmol/L 104  101  104   CO2 22 - 32 mmol/L 25  27  28    Calcium  8.9 - 10.3 mg/dL 9.3  9.6  9.4   Total Protein 6.5 - 8.1 g/dL 6.4  6.7  6.7   Total Bilirubin 0.0 - 1.2 mg/dL 0.3  0.4  0.2   Alkaline Phos 38 - 126 U/L 69  69  63   AST 15 - 41 U/L 38  36  25   ALT 0 - 44 U/L 49  42  19    Lab Results  Component Value Date   TSH 1.531 10/15/2023   T4TOTAL 6.9 02/17/2023   No results found for: "CEA1", "CEA" / No results found for: "CEA1", "CEA" No results found for: "PSA1" No results found for: "YQM578" No results found for: "CAN125"  No results found for: "TOTALPROTELP", "ALBUMINELP", "A1GS", "A2GS", "BETS", "BETA2SER", "GAMS", "MSPIKE", "SPEI" Lab Results  Component Value Date   TIBC 322 12/12/2022   TIBC 331 07/23/2021   FERRITIN 19 12/12/2022   FERRITIN 22 07/23/2021   IRONPCTSAT  30 12/12/2022   IRONPCTSAT 20 07/23/2021   Lab Results  Component Value Date   LDH 163 10/15/2023   LDH 162 07/01/2023   LDH 136 10/30/2021   Lab Results  Component Value Date   VITAMINB12 533 07/01/2023   STUDIES:  EXAM: 10/02/2023 CT CHEST, ABDOMEN, AND PELVIS WITH CONTRAST IMPRESSION: 1. Increased size of retroperitoneal, iliac side chain, pelvic sidewall and inguinal lymph nodes with new prominent thoracic inlet/upper mediastinal lymph nodes, compatible with worsening lymphoma. 2. Overall stable prominent bilateral supraclavicular and axillary adenopathy. 3. No splenomegaly. 4. Colonic stool burden compatible with constipation.  EXAM: 06/27/2023 MR angiogram of the intracranial arteries  IMPRESSION:  1.  Moderate stenosis in the P2 segment and mild stenosis in the P1 segment of the left posterior cerebral artery.  Will distal flow appeared normal.  The stenosis was not noted on the CT angiogram from 02/09/2021. 2.  Normal flow  noted in other intracranial arteries.  EXAM: 06/27/2023 MR Angiogram of the Neck Arteries with and without contrast  IMPRESSION: This is a normal MR angiogram of the neck arteries with and without contrast   EXAM: 06/23/2023 DIGITAL SCREENING BILATERAL MAMMOGRAM WITH TOMOSYNTHESIS AND CAD IMPRESSION: No mammographic evidence of malignancy.   HISTORY:   Past Medical History:  Diagnosis Date   Allergy    seasonal   Arthritis    Bipolar 1 disorder (HCC)    Cancer (HCC)    non hodgkins lymphoma   Depression    GERD (gastroesophageal reflux disease)    History of degenerative disc disease    Hyperlipidemia    Increased risk of breast cancer 04/25/2021   Obstructive sleep apnea    Osteopenia    Scoliosis    Thyroid  disease    hypothyroidism   Urticaria    Vitamin D deficiency     Past Surgical History:  Procedure Laterality Date   BREAST BIOPSY     BUBBLE STUDY  02/13/2021   Procedure: BUBBLE STUDY;  Surgeon: Maudine Sos, MD;  Location[facility] ENDOSCOPY;  Service: Cardiovascular;;   CARPAL TUNNEL RELEASE Bilateral 2002   CESAREAN SECTION     x2   COLONOSCOPY  07/26/2008   Melanosis coli. Small internal hemorrhoids.    ENDOSCOPIC PLANTAR FASCIOTOMY     ESOPHAGOGASTRODUODENOSCOPY  03/17/2013   Mild gastritis. Status post esophageal dilatation.   LYMPH NODE BIOPSY     right hip repair torn tendon Right 09/2021   TEE WITHOUT CARDIOVERSION N/A 02/13/2021   Procedure: TRANSESOPHAGEAL ECHOCARDIOGRAM (TEE);  Surgeon: Maudine Sos, MD;  Location: [facility] ENDOSCOPY;  Service: Cardiovascular;  Laterality: N/A;   WISDOM TOOTH EXTRACTION      Family History  Problem Relation Age of Onset   Prostate cancer Father 6   Melanoma Father 3   High blood pressure Father    Breast cancer Maternal Aunt    Multiple myeloma Maternal Aunt    Breast cancer Maternal Aunt    Colon cancer Neg Hx    Colon polyps Neg Hx    Esophageal cancer Neg Hx    Rectal cancer Neg Hx    Stomach  cancer Neg Hx     Social History:  reports that she has never smoked. She has never used smokeless tobacco. She reports that she does not currently use alcohol. She reports that she does not use drugs.The patient is accompanied by her husband today.  Allergies:  Allergies  Allergen Reactions   Diclofenac Sodium     Thought it had something to do  with her having a stroke   Voltaren [Diclofenac Sodium]     Thought it had something to do with her having a stroke   Nsaids     Had a stroke and worries it was related to the Diclofenac she took.    Current Medications: Current Outpatient Medications  Medication Sig Dispense Refill   atorvastatin  (LIPITOR) 80 MG tablet Take 1 tablet (80 mg total) by mouth daily. 30 tablet 5   cetirizine (ZYRTEC) 10 MG tablet Take 10 mg by mouth 2 (two) times daily.     estradiol (ESTRACE) 0.1 MG/GM vaginal cream 2 (two) times a week.     famotidine  (PEPCID ) 20 MG tablet Take 1 tablet (20 mg total) by mouth 2 (two) times daily. 60 tablet 5   FLUoxetine  HCl (PROZAC  PO) Take 60 mg by mouth daily.     Multiple Vitamin (MULTIVITAMIN ADULT PO) Take by mouth. Vision MD once a day     PLAVIX  75 MG tablet Take 75 mg by mouth daily.     Probiotic Product (PROBIOTIC BLEND PO) Take by mouth at bedtime.     ramipril  (ALTACE ) 2.5 MG capsule Take by mouth. 1 in am in 1 in pm     venetoclax (VENCLEXTA) 10 & 50 & 100 MG Starter Pack Take 20 mg daily for 7 days, then 50 mg daily x 7d, then 100 mg daily x 7d, then 200 mg daily x 7d. Take with food & water. 42 tablet 0   No current facility-administered medications for this visit.    I,[name],acting as a scribe for Dawn Baumgartner, MD.,have documented all relevant documentation on the behalf of Dawn Baumgartner, MD,as directed by  Dawn Baumgartner, MD while in the presence of Dawn Baumgartner, MD.

## 2023-10-14 ENCOUNTER — Inpatient Hospital Stay: Admitting: Oncology

## 2023-10-14 ENCOUNTER — Telehealth: Payer: Self-pay | Admitting: Oncology

## 2023-10-14 ENCOUNTER — Other Ambulatory Visit: Payer: Self-pay | Admitting: Oncology

## 2023-10-14 ENCOUNTER — Encounter: Payer: Self-pay | Admitting: Oncology

## 2023-10-14 VITALS — BP 133/79 | HR 69 | Temp 97.9°F | Resp 18 | Ht 60.24 in | Wt 113.4 lb

## 2023-10-14 DIAGNOSIS — D696 Thrombocytopenia, unspecified: Secondary | ICD-10-CM

## 2023-10-14 DIAGNOSIS — C8302 Small cell B-cell lymphoma, intrathoracic lymph nodes: Secondary | ICD-10-CM | POA: Diagnosis not present

## 2023-10-14 DIAGNOSIS — D519 Vitamin B12 deficiency anemia, unspecified: Secondary | ICD-10-CM

## 2023-10-14 DIAGNOSIS — C9110 Chronic lymphocytic leukemia of B-cell type not having achieved remission: Secondary | ICD-10-CM

## 2023-10-14 DIAGNOSIS — Z79899 Other long term (current) drug therapy: Secondary | ICD-10-CM | POA: Diagnosis not present

## 2023-10-14 NOTE — Telephone Encounter (Signed)
 Patient has been scheduled for follow-up visit per 10/13/23 LOS.  Pt given an appt calendar with date and time.

## 2023-10-15 ENCOUNTER — Other Ambulatory Visit: Payer: Self-pay

## 2023-10-15 ENCOUNTER — Other Ambulatory Visit: Payer: Self-pay | Admitting: Oncology

## 2023-10-15 ENCOUNTER — Telehealth: Payer: Self-pay

## 2023-10-15 ENCOUNTER — Other Ambulatory Visit (HOSPITAL_COMMUNITY): Payer: Self-pay

## 2023-10-15 ENCOUNTER — Encounter: Payer: Self-pay | Admitting: Oncology

## 2023-10-15 ENCOUNTER — Inpatient Hospital Stay

## 2023-10-15 DIAGNOSIS — C8302 Small cell B-cell lymphoma, intrathoracic lymph nodes: Secondary | ICD-10-CM

## 2023-10-15 DIAGNOSIS — Z79899 Other long term (current) drug therapy: Secondary | ICD-10-CM | POA: Diagnosis not present

## 2023-10-15 DIAGNOSIS — C9110 Chronic lymphocytic leukemia of B-cell type not having achieved remission: Secondary | ICD-10-CM

## 2023-10-15 DIAGNOSIS — D519 Vitamin B12 deficiency anemia, unspecified: Secondary | ICD-10-CM | POA: Diagnosis not present

## 2023-10-15 DIAGNOSIS — D696 Thrombocytopenia, unspecified: Secondary | ICD-10-CM | POA: Insufficient documentation

## 2023-10-15 LAB — CBC WITH DIFFERENTIAL (CANCER CENTER ONLY)
Abs Immature Granulocytes: 0.01 10*3/uL (ref 0.00–0.07)
Basophils Absolute: 0 10*3/uL (ref 0.0–0.1)
Basophils Relative: 0 %
Eosinophils Absolute: 0.1 10*3/uL (ref 0.0–0.5)
Eosinophils Relative: 1 %
HCT: 35 % — ABNORMAL LOW (ref 36.0–46.0)
Hemoglobin: 11.4 g/dL — ABNORMAL LOW (ref 12.0–15.0)
Immature Granulocytes: 0 %
Immature Platelet Fraction: 3.3 % (ref 1.2–8.6)
Lymphocytes Relative: 69 %
Lymphs Abs: 4.8 10*3/uL — ABNORMAL HIGH (ref 0.7–4.0)
MCH: 33.5 pg (ref 26.0–34.0)
MCHC: 32.6 g/dL (ref 30.0–36.0)
MCV: 102.9 fL — ABNORMAL HIGH (ref 80.0–100.0)
Monocytes Absolute: 0.2 10*3/uL (ref 0.1–1.0)
Monocytes Relative: 2 %
Neutro Abs: 1.9 10*3/uL (ref 1.7–7.7)
Neutrophils Relative %: 28 %
Platelet Count: 139 10*3/uL — ABNORMAL LOW (ref 150–400)
RBC: 3.4 MIL/uL — ABNORMAL LOW (ref 3.87–5.11)
RDW: 14.5 % (ref 11.5–15.5)
WBC Count: 7 10*3/uL (ref 4.0–10.5)
nRBC: 0 % (ref 0.0–0.2)

## 2023-10-15 LAB — CMP (CANCER CENTER ONLY)
ALT: 49 U/L — ABNORMAL HIGH (ref 0–44)
AST: 38 U/L (ref 15–41)
Albumin: 4 g/dL (ref 3.5–5.0)
Alkaline Phosphatase: 69 U/L (ref 38–126)
Anion gap: 10 (ref 5–15)
BUN: 19 mg/dL (ref 8–23)
CO2: 25 mmol/L (ref 22–32)
Calcium: 9.3 mg/dL (ref 8.9–10.3)
Chloride: 104 mmol/L (ref 98–111)
Creatinine: 0.77 mg/dL (ref 0.44–1.00)
GFR, Estimated: >60 mL/min (ref >60)
Glucose, Bld: 104 mg/dL — ABNORMAL HIGH (ref 70–99)
Potassium: 4.5 mmol/L (ref 3.5–5.1)
Sodium: 138 mmol/L (ref 135–145)
Total Bilirubin: 0.3 mg/dL (ref 0.0–1.2)
Total Protein: 6.4 g/dL — ABNORMAL LOW (ref 6.5–8.1)

## 2023-10-15 LAB — PHOSPHORUS: Phosphorus: 4.1 mg/dL (ref 2.5–4.6)

## 2023-10-15 LAB — LACTATE DEHYDROGENASE: LDH: 163 U/L (ref 98–192)

## 2023-10-15 LAB — TSH: TSH: 1.531 u[IU]/mL (ref 0.350–4.500)

## 2023-10-15 LAB — URIC ACID: Uric Acid, Serum: 2.6 mg/dL (ref 2.5–7.1)

## 2023-10-15 MED ORDER — VENETOCLAX 10 & 50 & 100 MG PO TBPK
ORAL_TABLET | ORAL | 0 refills | Status: DC
  Filled 2023-10-15: qty 42, 28d supply, fill #0

## 2023-10-15 NOTE — Progress Notes (Signed)
 Patient counseled on venetoclax in clinic encounter on 10/15/23.  Marykathryn Carboni, PharmD Hematology/Oncology Clinical Pharmacist [facility] [phone]

## 2023-10-15 NOTE — Addendum Note (Signed)
 Addended by: Earma Gloss on: 10/15/2023 09:31 AM   Modules accepted: Orders

## 2023-10-15 NOTE — Progress Notes (Signed)
 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 Planned duration until disease progression or unacceptable toxicity.   Venclexta dose will be titrated up per manufacturer recommendations and rituximab will be added to regimen.  Counseled patient on administration, dosing, side effects, monitoring, drug-food interactions, safe handling, storage, and disposal.  Patient will take Venclexta 10mg  tablets, 2 tablets (20 mg) by mouth once daily with food and water for 7 days. Patient will then take venclexta 50mg  tablets, 1 tablet by mouth once daily with food for 7 days.   Venclexta start date: 10/20/2023  Patient was instructed to increase fluid intake to 1.5 - 2L of water per day starting 2 days prior to Venclexta initiation.   Baseline labs were assessed at this visit. Patient will have CBC, uric acid, and electrolytes monitored 24 hours after initiation of Venclexta and at daily x2 days at each weekly ramp up of the medication. Patient has been scheduled for labs.   Latest Reference Range & Units 10/15/23 10:23  Sodium 135 - 145 mmol/L 138  Potassium 3.5 - 5.1 mmol/L 4.5  Chloride 98 - 111 mmol/L 104  CO2 22 - 32 mmol/L 25  Glucose 70 - 99 mg/dL 478 (H)  BUN 8 - 23 mg/dL 19  Creatinine 2.95 - 6.21 mg/dL 3.08  Calcium  8.9 - 10.3 mg/dL 9.3  Anion gap 5 - 15  10  Phosphorus 2.5 - 4.6 mg/dL 4.1  Alkaline Phosphatase 38 - 126 U/L 69  Albumin 3.5 - 5.0 g/dL 4.0  Uric Acid, Serum 2.5 - 7.1 mg/dL 2.6  AST 15 - 41 U/L 38  ALT 0 - 44 U/L 49 (H)  Total Protein 6.5 - 8.1 g/dL 6.4 (L)  Total Bilirubin 0.0 - 1.2 mg/dL 0.3    Latest Reference Range & Units 10/15/23 10:23  WBC 4.0 - 10.5 K/uL 7.0  RBC 3.87 - 5.11 MIL/uL 3.40 (L)  Hemoglobin 12.0 - 15.0 g/dL 65.7 (L)  HCT 84.6 - 96.2 % 35.0  (L)  MCV 80.0 - 100.0 fL 102.9 (H)  MCH 26.0 - 34.0 pg 33.5  MCHC 30.0 - 36.0 g/dL 95.2  RDW 84.1 - 32.4 % 14.5  Platelets 150 - 400 K/uL 139 (L)   Subsequent ramp-up doses of 100mg  daily x 7 days, 200mg  daily x 7 days, and 400mg  daily (target maintenance dose) will be monitored per protocol.  Adverse effects include but are not limited to: TLS, decreased blood counts, electrolyte abnormalities, diarrhea, nausea, fatigue, arthralgias/myalgias, and upper respiratory tract infection.    Patient will have anti-emetic on hand and knows to take it if nausea develops. Requested medication be sent in by MD.  Patient will obtain anti diarrheal and alert the office of 4 or more loose stools above baseline.  Reviewed with patient importance of keeping a medication schedule and plan for any missed doses. No barriers to medication adherence identified.  Medication reconciliation performed and medication/allergy list updated.  All questions answered. Patient and patients husband voiced understanding and appreciation.  Medication education handout given to patient in clinic. Patient knows to call the office with questions or concerns. Oral [facility]otherapy Clinic phone number provided to patient.   Diavian Furgason, PharmD Hematology/Oncology Clinical Pharmacist [facility] [phone] 10/15/2023   3:01 PM

## 2023-10-15 NOTE — Telephone Encounter (Signed)
 Patient successfully OnBoarded and drug education provided by pharmacist. Medication scheduled to be shipped on Thursday, 10/16/23, for delivery on Friday, 10/17/23, from [facility] to patient's address. Patient also knows to call me at [phone] with any questions or concerns regarding receiving medication or if there is any unexpected change in co-pay.    Hansel Ley, CPhT Pharmacy Technician Coordinator [facility] [phone] (Ph) 10/15/2023 3:53 PM

## 2023-10-15 NOTE — Telephone Encounter (Signed)
 Oral Oncology Patient Advocate Encounter   Was successful in securing patient an $4,000.00 grant from [employer] [facility]) to provide copayment coverage for Venclexta.  This will keep the out of pocket expense at $0.     The billing information is as follows and has been shared with [facility].   Member ID: [id number] Group ID: [id number] RxBin: [id number] Dates of Eligibility: 07/16/23 through 10/12/24  Fund:  Chronic Lymphocytic Leukemia   Hansel Ley, CPhT Pharmacy Technician Coordinator [facility] [phone] (Ph) 10/15/2023 10:47 AM

## 2023-10-15 NOTE — Telephone Encounter (Signed)
 Oral Oncology Patient Advocate Encounter  New authorization   Received notification that prior authorization for Venclexta is required.   PA submitted on 10/15/23  Key [id number]  Status is pending     Hansel Ley, CPhT Oncology Pharmacy Patient Advocate  [facility]  [phone] (phone) [phone] (fax) 10/15/2023 9:37 AM

## 2023-10-15 NOTE — Addendum Note (Signed)
 Addended by: Talaysia Pinheiro M on: 10/15/2023 09:29 AM   Modules accepted: Orders

## 2023-10-15 NOTE — Progress Notes (Signed)
 Specialty Pharmacy Initial Fill Coordination Note  Dawn Prince is a 63 y.o. female contacted today regarding initial fill of specialty medication(s) Venetoclax Stephani Ege)  Patient requested Delivery   Delivery date: 10/17/23   Verified address: [address], [place], [place] [address]  Medication will be filled on 10/16/23.   Patient is aware of $0.00 copayment. Bill PANF Grant secondary.    Hansel Ley, CPhT Pharmacy Technician Coordinator [facility] [phone] (Ph) 10/15/2023 3:40 PM

## 2023-10-15 NOTE — Telephone Encounter (Addendum)
 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  Patient will receive labs today in clinic visit. Discussed with MD, will hold off on initiating allopurinol (pending uric acid) as patient does not have bulky disease and is relatively an indolent disease. MD will add the ramp up prescription.   Current medication list in Epic reviewed, no significant/ relevant DDIs with Venetoclax identified.  Evaluated chart and no patient barriers to medication adherence noted. 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   Oral Oncology Clinic will continue to follow for insurance authorization, copayment issues, initial counseling and start date.  Sonna Lipsky, PharmD Hematology/Oncology Clinical Pharmacist [facility] [phone] 10/15/2023 9:05 AM

## 2023-10-15 NOTE — Telephone Encounter (Addendum)
 Oral Oncology Patient Advocate Encounter  Prior Authorization for Venclexta has been approved.    PA# [id number]  Effective dates: 10/15/23 through 05/26/24  Patients co-pay is $1,194.00.   [employer] [place]) Norberta Beans obtained to make co-pay $0.00.   Hansel Ley, CPhT Pharmacy Technician Coordinator [facility] [phone] (Ph) 10/15/2023 10:33 AM

## 2023-10-16 ENCOUNTER — Other Ambulatory Visit: Payer: Self-pay

## 2023-10-16 ENCOUNTER — Other Ambulatory Visit: Payer: Self-pay | Admitting: Hematology and Oncology

## 2023-10-16 LAB — IGG, IGA, IGM
IgA: 104 mg/dL (ref 87–352)
IgG (Immunoglobin G), Serum: 1024 mg/dL (ref 586–1602)
IgM (Immunoglobulin M), Srm: 28 mg/dL (ref 26–217)

## 2023-10-16 LAB — T4: T4, Total: 7 ug/dL (ref 4.5–12.0)

## 2023-10-16 MED ORDER — ONDANSETRON 4 MG PO TBDP: 4.0000 mg | ORAL_TABLET | Freq: Three times a day (TID) | ORAL | 2 refills | Status: DC | PRN

## 2023-10-17 ENCOUNTER — Telehealth: Payer: Self-pay

## 2023-10-17 NOTE — Telephone Encounter (Signed)
 Patient notified of message

## 2023-10-17 NOTE — Telephone Encounter (Signed)
-----   Message from Nolia Baumgartner sent at 10/15/2023  6:27 PM EDT ----- Regarding: call Tell her baseline labs look good but she does have a mild decrease and her platelets and red cells so we will be monitoring closely.

## 2023-10-21 ENCOUNTER — Other Ambulatory Visit: Payer: Self-pay

## 2023-10-21 ENCOUNTER — Inpatient Hospital Stay

## 2023-10-21 DIAGNOSIS — C9110 Chronic lymphocytic leukemia of B-cell type not having achieved remission: Secondary | ICD-10-CM | POA: Diagnosis not present

## 2023-10-21 DIAGNOSIS — C8302 Small cell B-cell lymphoma, intrathoracic lymph nodes: Secondary | ICD-10-CM

## 2023-10-21 DIAGNOSIS — Z79899 Other long term (current) drug therapy: Secondary | ICD-10-CM | POA: Diagnosis not present

## 2023-10-21 DIAGNOSIS — D519 Vitamin B12 deficiency anemia, unspecified: Secondary | ICD-10-CM | POA: Diagnosis not present

## 2023-10-21 LAB — CMP (CANCER CENTER ONLY)
ALT: 67 U/L — ABNORMAL HIGH (ref 0–44)
AST: 69 U/L — ABNORMAL HIGH (ref 15–41)
Albumin: 3.8 g/dL (ref 3.5–5.0)
Alkaline Phosphatase: 73 U/L (ref 38–126)
Anion gap: 11 (ref 5–15)
BUN: 15 mg/dL (ref 8–23)
CO2: 26 mmol/L (ref 22–32)
Calcium: 9.5 mg/dL (ref 8.9–10.3)
Chloride: 102 mmol/L (ref 98–111)
Creatinine: 0.75 mg/dL (ref 0.44–1.00)
GFR, Estimated: >60 mL/min (ref >60)
Glucose, Bld: 84 mg/dL (ref 70–99)
Potassium: 4.4 mmol/L (ref 3.5–5.1)
Sodium: 139 mmol/L (ref 135–145)
Total Bilirubin: 0.3 mg/dL (ref 0.0–1.2)
Total Protein: 6.1 g/dL — ABNORMAL LOW (ref 6.5–8.1)

## 2023-10-21 LAB — URIC ACID: Uric Acid, Serum: 4.1 mg/dL (ref 2.5–7.1)

## 2023-10-21 LAB — PHOSPHORUS: Phosphorus: 3.8 mg/dL (ref 2.5–4.6)

## 2023-10-22 ENCOUNTER — Other Ambulatory Visit: Payer: Self-pay | Admitting: Oncology

## 2023-10-22 DIAGNOSIS — C8302 Small cell B-cell lymphoma, intrathoracic lymph nodes: Secondary | ICD-10-CM

## 2023-10-22 NOTE — Progress Notes (Signed)
 [facility]  [address] 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  She has been on observation only. She has had stable bilateral cervical and inguinal lymphadenopathy.  MRI imaging from February 2023 revealed extensive inguinal and retroperitoneal lymphadenopathy.  Her physical exam reveals a modest change in her cervical and supraclavicular adenopathy. PET scan in March of 2023 revealed continued stability of mild adenopathy in the neck, chest, abdomen and pelvis with low level FDG and Deauville 2-3 category uptake. The CT in September of 2023 shows the numerous mildly enlarged nodes to be unchanged. Her lymphadenopathy is relatively stable at this time but just mildly increased. Her CT chest, abdomen and pelvis done on 11/06/22 revealed stable bilateral axillary and supraclavicular adenopathy, no mediastinal lymphadenopathy, a new band of linear consolidation in the LEFT upper lobe, which is probably post infectious consolidation. She has stable retroperitoneal and proximal iliac lymphadenopathy, and no new adenopathy in the abdomen and pelvis.  There is a normal spleen and no skeletal lesions. She has progression of her lymphadenopathy by exam and by CT scan.  We have started her on venetoclax  with a very slowly escalating dose of 20 mg daily for 1 week, 50 mg daily for 1 week, 100 mg daily for 1 week and then 200 mg daily.  She will need labs weekly including phosphorus and uric acid. Her quantitative immunoglobulins and baseline TSH were normal.    B12 deficiency anemia She was treated with B12 injections then transitioned to oral B12.  She is no longer taking B12 as her B12 level has been elevated at the Wellness office.  On May 11, B12 was greater than  2000, iron studies and folate were normal.  She remains off B12 as her last level in June was over 1,000. It dropped to 350 in March, 2024 and so I had advised her to get back on oral B-12 and the level was back over 1,000 in June, 2024. We will continue to monitor this.  Pneumonia Left upper lobe, lobar pneumonia in May, 2024, has been treated with multiple antibiotics and is clinically improving. CT scan shows resolution with a band of linear consolidation in the left upper lobe.  CVA She had another stroke in December, 2024 and is now on Plavix  75 mg. She has seen the neurologist and they are working on better control of her blood pressure and cholesterol.    Plan: She continues Venetoclax  and is tolerating this well. We reviewed neutropenic precaution together. She has a low WBC of 2.4 with an ANC of 1100, hemoglobin of 11.2 down from 11.4, and platelet count of 133,000. Her CMP is normal other than a lot total protein of 6.3 improved from 6.1. Her uric acid and phosphorus are pending. I will see her back in 1 week with CBC, CMP, uric acid and phosphorus. The patient and her husband understand the plans discussed today and are in agreement with them.  She knows to contact our office if she develops concerns prior to her next appointment.  I provided 15 minutes of face-to-face time during this encounter and > 50% was spent counseling as documented under my assessment and plan.   Nolia Baumgartner, MD  [facility] OF [facility]  Katheryn Pandy [address] [place] [address] Dept: [phone] Dept Fax: [phone]   No orders of the defined types were placed in this encounter.  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  Staging CT chest  revealed adenopathy of the bilateral neck measuring up to 18 mm in diameter with left subclavian, bilateral axillary and subpectoral adenopathy, but no mediastinal nodes.  CT abdomen was  negative.  CT pelvis revealed bilateral external iliac nodes up to 11 mm in diameter.  She had B symptoms with severe night sweats and weight loss.  She has been on  observation only.  Due to her family history of breast cancer, she underwent testing for hereditary breast and ovarian cancer with the My[employer] myRisk Hereditary Cancer Gene panel test.  This did not reveal any clinically significant mutation.  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  Her [facility] breast cancer risk assessment showed her lifetime risk of breast cancer to be 29.7%, so annual breast MRI, in addition to mammogram is recommended.  Mammogram and MRI breast done in August 2019 did not reveal any evidence of malignancy.  She did go for a second opinion to [facility] regarding her lymphoma and they concurred with the approach of watchful waiting.    She presented to the emergency room in mid April 2021 due to increased urinary frequency and discomfort.  CT imaging revealed left sided obstructive uropathy with 3 mm calculus in the distal left ureter just proximal to the ureterovesical unction causing moderate hydroureteronephrosis and perinephric stranding.  Left greater than right iliac and pelvic lymphadenopathy, slightly greater than on 10/26/18, consistent with known history of lymphoma.  We did not repeat a scan in June as scheduled.  Annual screening bilateral mammogram from August 2021 was clear.  MRI breast has not been repeated due to her comorbidities.   CT neck in May 2022 revealed multiple lymph nodes in the neck bilaterally, with mild progression of lymph nodes on the right. There has been more significant progression of left level 4 and left supraclavicular lymph nodes compared to the prior study. Largest  lymph node in the left supraclavicular region measures 35 x 17 mm. Findings were compatible with lymphoma. CT chest/abdomen/pelvis revealed no substantial interval change in exam. Bilateral supraclavicular, subpectoral, axillary, retroperitoneal, and pelvic lymphadenopathy was similar to prior. There were no definite findings of progression.  At her visit in September, the lymphoma remained stable. She continued to have sciatic pain unrelieved with gabapentin  800 mg TID. She had steroid injections of the spine with improvement.    Bilateral screening mammogram in January of 2023 revealed a possible asymmetry in the left breast.  She underwent left diagnostic mammogram and the asymmetry disperses with compression, so it is felt to be dense fibroglandular tissue.  There is no evidence of malignancy.  Bilateral screening in 1 year was recommended.  MRI of the right hip from February 2023 revealed extensive inguinal and retroperitoneal lymphadenopathy. There are high-grade tears of the right gluteus numbness greater than medius with background tendinosis and trochanteric bursitis, and bilateral hamstring origin tendinosis with mild partial tearing. She has mild bilateral hip osteoarthritis, and  right sided labral tear. She states that she has chronic palpable lymph nodes of the left neck and supraclavicular area.  She therefore underwent PET scan in March of 2023, which revealed stable disease when compared to previous CT images.  After 8 years of follow-up, she had significant progression of disease in May 2025 and so will be placed on treatment with venetoclax .  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)  11/11/2015 Cancer Staging   Staging form: Hodgkin and Non-Hodgkin Lymphoma, AJCC 8th Edition - Clinical stage from 11/11/2015: Stage III (Small lymphocytic leukemia) - Signed by Nolia Baumgartner, MD on 01/28/2021 Histopathologic type: Malignant lymphoma, small B  lymphocytic, NOS (see also M-9823/3) Stage prefix: Initial diagnosis Diagnostic confirmation: Positive histology PLUS positive immunophenotyping and/or positive genetic studies Specimen type: Core Needle Biopsy Staged by: Managing physician Stage used in treatment planning: Yes National guidelines used in treatment planning: Yes Type of national guideline used in treatment planning: NCCN Staging comments: Watchful waiting   06/20/2020 Initial Diagnosis   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)   Chronic lymphocytic leukemia (CLL), B-cell (HCC)  10/31/2015 Initial Diagnosis   Chronic lymphocytic leukemia (CLL), B-cell (HCC)   11/11/2015 Cancer Staging   Staging form: Chronic Lymphocytic Leukemia / Small Lymphocytic Lymphoma, AJCC 8th Edition - Clinical stage from 11/11/2015: Modified Rai Stage III (Modified Rai risk: High, Binet: Stage B, Lugano: Stage III, Lymphocytosis: Absent, Adenopathy: Present, Organomegaly: Absent, Anemia: Absent, Thrombocytopenia: Absent) - Signed by Nolia Baumgartner, MD on 04/05/2021 Histopathologic type: B-cell lymphocytic leukemia/small lymphocytic lymphoma (see also M-9670/3) Stage prefix: Initial diagnosis Stage used in treatment planning: Yes National guidelines used in treatment planning: Yes Type of national guideline used in treatment planning: NCCN     INTERVAL HISTORY:  Dorena  is here today for repeat clinical assessment for small cell B cell lymphoma, diagnosed in June,  2017. She has never required treatment for this but she does have slow progression of her lymphadenopathy and lymphocytosis. Patient states that she feels ok but complain of left hip pain rating 6/10, shortness of breath, and occasional nausea and diarrhea. She continues Venetoclax  and is tolerating this well. She has a low WBC of 2.4 with an ANC of 1100, hemoglobin of 11.2 down from 11.4, and platelet count of 133,000. Her CMP is normal other than a lot total  protein of 6.3 improved from 6.1. Her uric acid and phosphorus are pending. I will see her back in 1 week with CBC, CMP, uric acid, and phosphorus.   REVIEW OF SYSTEMS:  Review of Systems  Constitutional:  Positive for fatigue. Negative for appetite change, chills, diaphoresis, fever and unexpected weight change.  HENT:  Negative.  Negative for hearing loss, lump/mass, mouth sores, nosebleeds, sore throat, tinnitus, trouble swallowing and voice change.   Eyes: Negative.  Negative for eye problems and icterus.  Respiratory:  Positive for shortness of breath (occasional). Negative for chest tightness, cough, hemoptysis and wheezing.   Cardiovascular:  Negative for chest pain, leg swelling and palpitations.  Gastrointestinal:  Positive for diarrhea (1 episode) and nausea (1 episode). Negative for abdominal distention, abdominal pain, blood in stool, constipation, rectal pain and vomiting.  Endocrine: Negative.   Genitourinary:  Negative for bladder incontinence, difficulty urinating, dyspareunia, dysuria, frequency, hematuria, menstrual problem, nocturia, pelvic pain, vaginal bleeding and vaginal discharge.   Musculoskeletal:  Positive for arthralgias (right shoulder improved but still hurts) and back pain (lower). Negative for flank pain, gait problem, myalgias, neck pain and neck stiffness.       Left hip pain and leg pain, 6/10  Skin: Negative.  Negative for itching, rash and wound.  Neurological:  Positive for speech difficulty (in december, transient with CVA). Negative for dizziness, extremity weakness, gait problem, headaches, light-headedness, numbness and seizures.  Hematological:  Negative for adenopathy. Bruises/bleeds easily.  Psychiatric/Behavioral:  Positive for sleep disturbance. Negative for confusion, decreased concentration, depression and suicidal ideas. The patient is not nervous/anxious.     VITALS:  Blood pressure 105/68, pulse 70, temperature 97.8 F (36.6 C), temperature  source Oral, resp. rate 18, height 5' 0.24 (1.53 m), weight 115 lb 6.4 oz (52.3 kg), SpO2 100%.  Wt Readings from Last 3 Encounters:  11/11/23 113 lb (51.3 kg)  11/04/23 113 lb 8 oz (51.5 kg)  10/28/23 115 lb 6.4 oz (52.3 kg)    Body mass index is 22.36 kg/m.  Performance status (ECOG): 1 - Symptomatic but completely ambulatory  PHYSICAL EXAM:  Physical Exam Vitals and nursing note reviewed. Exam conducted with a chaperone present.  Constitutional:      General: She is not in acute distress.    Appearance: Normal appearance. She is normal weight. She is not ill-appearing, toxic-appearing or diaphoretic.  HENT:     Head: Normocephalic and atraumatic.     Right Ear: Tympanic membrane, ear canal and external ear normal. There is no impacted cerumen.     Left Ear: Tympanic membrane, ear canal and external ear normal. There is no impacted cerumen.     Nose: Nose normal. No congestion or rhinorrhea.     Mouth/Throat:     Mouth: Mucous membranes are moist.     Pharynx: Oropharynx is clear. No oropharyngeal exudate or posterior oropharyngeal erythema.   Eyes:     General: No scleral icterus.       Right eye: No discharge.        Left eye: No discharge.     Extraocular Movements: Extraocular movements intact.     Conjunctiva/sclera: Conjunctivae normal.     Pupils: Pupils are equal, round, and reactive to light.   Neck:     Vascular: No carotid bruit.     Comments:   Cardiovascular:     Rate and Rhythm: Normal rate and regular rhythm.     Pulses: Normal pulses.     Heart sounds: Normal heart sounds. No murmur heard.    No friction rub. No gallop.  Pulmonary:     Effort: Pulmonary effort is normal. No respiratory distress.     Breath sounds: Normal breath sounds. No stridor. No wheezing, rhonchi or rales.  Chest:     Chest wall: No tenderness.  Abdominal:     General: Bowel sounds are normal. There is no distension.     Palpations: Abdomen is soft. There is no hepatomegaly,  splenomegaly or mass.     Tenderness: There is no abdominal tenderness. There is no right CVA tenderness, left CVA tenderness, guarding or rebound.     Hernia: No hernia is present.   Musculoskeletal:        General: No swelling, tenderness, deformity or signs of injury. Normal range of motion.     Cervical back: Normal range of motion and neck supple. No rigidity or tenderness.     Right lower leg: No edema.     Left lower leg: No edema.  Lymphadenopathy:     Cervical: Cervical adenopathy present.     Right cervical: Superficial cervical adenopathy and posterior cervical adenopathy present.     Left cervical: Posterior cervical adenopathy present.  Upper Body:     Right upper body: Supraclavicular adenopathy and axillary adenopathy present. No pectoral adenopathy.     Left upper body: Supraclavicular adenopathy and axillary adenopathy present. No pectoral adenopathy.     Lower Body: Right inguinal adenopathy present. Left inguinal adenopathy present.     Comments: 2cm left posterior cervical node. Several bilateral 1cm cervical nodes. Bilateral supraclavicular nodes also mostly 1cm.  Left axillary node which is 2-3cm.  Three nodes in the right axilla that measure about 1cm.  2cm node in the left inguinal area and additional node lateral to that.   On 10/28/2023 the examination was very similar ubt a couple of the nodes were slightly smaller in the left cervical area and the left axilla   Skin:    General: Skin is warm and dry.     Coloration: Skin is not jaundiced or pale.     Findings: No bruising, erythema, lesion or rash.   Neurological:     General: No focal deficit present.     Mental Status: She is alert and oriented to person, place, and time. Mental status is at baseline.     Cranial Nerves: No cranial nerve deficit.     Sensory: No sensory deficit.     Motor: No weakness.     Coordination: Coordination normal.     Gait: Gait normal.     Deep Tendon Reflexes:  Reflexes normal.   Psychiatric:        Mood and Affect: Mood normal.        Behavior: Behavior normal.        Thought Content: Thought content normal.        Judgment: Judgment normal.    LABS:      Latest Ref Rng & Units 11/11/2023    8:38 AM 11/04/2023   11:13 AM 10/28/2023    2:20 PM  CBC  WBC 4.0 - 10.5 K/uL 1.1  2.5  2.4   Hemoglobin 12.0 - 15.0 g/dL 16.1  09.6  04.5   Hematocrit 36.0 - 46.0 % 33.2  33.7  33.0   Platelets 150 - 400 K/uL 125  150  133       Latest Ref Rng & Units 11/11/2023    8:38 AM 11/04/2023   11:13 AM 10/28/2023    2:20 PM  CMP  Glucose 70 - 99 mg/dL 409  811  914   BUN 8 - 23 mg/dL 13  17  17    Creatinine 0.44 - 1.00 mg/dL 7.82  9.56  2.13   Sodium 135 - 145 mmol/L 135  136  135   Potassium 3.5 - 5.1 mmol/L 4.2  4.3  4.9   Chloride 98 - 111 mmol/L 101  103  100   CO2 22 - 32 mmol/L 25  24  27    Calcium  8.9 - 10.3 mg/dL 9.3  9.4  9.5   Total Protein 6.5 - 8.1 g/dL 6.6  6.7  6.3   Total Bilirubin 0.0 - 1.2 mg/dL 0.7  0.3  0.4   Alkaline Phos 38 - 126 U/L 69  67  66   AST 15 - 41 U/L 36  41  33   ALT 0 - 44 U/L 52  56  44    Lab Results  Component Value Date   TSH 1.531 10/15/2023   T4TOTAL 7.0 10/15/2023   No results found for: CEA1, CEA / No results found for: CEA1, CEA No results found for: PSA1 No results  found for: ZOX096 No results found for: CAN125  No results found for: Tanner Fanny, A1GS, A2GS, BETS, BETA2SER, GAMS, MSPIKE, SPEI Lab Results  Component Value Date   TIBC 322 12/12/2022   TIBC 331 07/23/2021   FERRITIN 19 12/12/2022   FERRITIN 22 07/23/2021   IRONPCTSAT 30 12/12/2022   IRONPCTSAT 20 07/23/2021   Lab Results  Component Value Date   LDH 163 10/15/2023   LDH 162 07/01/2023   LDH 136 10/30/2021   Lab Results  Component Value Date   VITAMINB12 330 11/11/2023   STUDIES:  EXAM: 10/02/2023 CT CHEST, ABDOMEN, AND PELVIS WITH CONTRAST IMPRESSION: 1. Increased size of  retroperitoneal, iliac side chain, pelvic sidewall and inguinal lymph nodes with new prominent thoracic inlet/upper mediastinal lymph nodes, compatible with worsening lymphoma. 2. Overall stable prominent bilateral supraclavicular and axillary adenopathy. 3. No splenomegaly. 4. Colonic stool burden compatible with constipation.  EXAM: 06/27/2023 MR angiogram of the intracranial arteries  IMPRESSION:  1.  Moderate stenosis in the P2 segment and mild stenosis in the P1 segment of the left posterior cerebral artery.  Will distal flow appeared normal.  The stenosis was not noted on the CT angiogram from 02/09/2021. 2.  Normal flow noted in other intracranial arteries.  EXAM: 06/27/2023 MR Angiogram of the Neck Arteries with and without contrast  IMPRESSION: This is a normal MR angiogram of the neck arteries with and without contrast   EXAM: 06/23/2023 DIGITAL SCREENING BILATERAL MAMMOGRAM WITH TOMOSYNTHESIS AND CAD IMPRESSION: No mammographic evidence of malignancy.   HISTORY:   Past Medical History:  Diagnosis Date   Allergy    seasonal   Arthritis    Bipolar 1 disorder (HCC)    Cancer (HCC)    non hodgkins lymphoma   Depression    GERD (gastroesophageal reflux disease)    History of degenerative disc disease    Hyperlipidemia    Increased risk of breast cancer 04/25/2021   Obstructive sleep apnea    Osteopenia    Scoliosis    Thyroid  disease    hypothyroidism   Urticaria    Vitamin D deficiency     Past Surgical History:  Procedure Laterality Date   BREAST BIOPSY     BUBBLE STUDY  02/13/2021   Procedure: BUBBLE STUDY;  Surgeon: Maudine Sos, MD;  Location: [facility] ENDOSCOPY;  Service: Cardiovascular;;   CARPAL TUNNEL RELEASE Bilateral 2002   CESAREAN SECTION     x2   COLONOSCOPY  07/26/2008   Melanosis coli. Small internal hemorrhoids.    ENDOSCOPIC PLANTAR FASCIOTOMY     ESOPHAGOGASTRODUODENOSCOPY  03/17/2013   Mild gastritis. Status post esophageal  dilatation.   LYMPH NODE BIOPSY     right hip repair torn tendon Right 09/2021   TEE WITHOUT CARDIOVERSION N/A 02/13/2021   Procedure: TRANSESOPHAGEAL ECHOCARDIOGRAM (TEE);  Surgeon: Maudine Sos, MD;  Location: [facility] ENDOSCOPY;  Service: Cardiovascular;  Laterality: N/A;   WISDOM TOOTH EXTRACTION      Family History  Problem Relation Age of Onset   Prostate cancer Father 53   Melanoma Father 64   High blood pressure Father    Breast cancer Maternal Aunt    Multiple myeloma Maternal Aunt    Breast cancer Maternal Aunt    Colon cancer Neg Hx    Colon polyps Neg Hx    Esophageal cancer Neg Hx    Rectal cancer Neg Hx    Stomach cancer Neg Hx     Social History:  reports that she has never smoked.  She has never used smokeless tobacco. She reports that she does not currently use alcohol. She reports that she does not use drugs.The patient is accompanied by her husband today.  Allergies:  Allergies  Allergen Reactions   Diclofenac Sodium Other (See Comments)    Thought it had something to do with her having a stroke   Voltaren [Diclofenac Sodium]     Thought it had something to do with her having a stroke   Nsaids Other (See Comments)    Had a stroke and worries it was related to the Diclofenac she took.    Current Medications: Current Outpatient Medications  Medication Sig Dispense Refill   Ascorbic Acid (VITAMIN C) 1000 MG tablet Take 1,000 mg by mouth 2 (two) times daily.     atorvastatin  (LIPITOR) 80 MG tablet Take 1 tablet (80 mg total) by mouth daily. 30 tablet 5   cetirizine (ZYRTEC) 10 MG tablet Take 10 mg by mouth 2 (two) times daily.     estradiol (ESTRACE) 0.1 MG/GM vaginal cream 2 (two) times a week. (Patient not taking: Reported on 11/04/2023)     famotidine  (PEPCID ) 20 MG tablet Take 1 tablet (20 mg total) by mouth 2 (two) times daily. 60 tablet 5   FLUoxetine  HCl (PROZAC  PO) Take 60 mg by mouth daily.     Multiple Vitamin (MULTIVITAMIN ADULT PO) Take by mouth.  Vision MD once a day     ondansetron  (ZOFRAN -ODT) 4 MG disintegrating tablet Take 1 tablet (4 mg total) by mouth every 8 (eight) hours as needed for nausea or vomiting. 20 tablet 2   PLAVIX  75 MG tablet Take 75 mg by mouth daily.     Probiotic Product (PROBIOTIC BLEND PO) Take by mouth at bedtime.     ramipril  (ALTACE ) 2.5 MG capsule Take by mouth. 1 in am in 1 in pm     venetoclax  (VENCLEXTA ) 100 MG tablet Take 2 tablets (200 mg total) by mouth daily. Tablets should be swallowed whole with a meal and a full glass of water. 60 tablet 5   Zinc 50 MG TABS Take 1 tablet by mouth 2 (two) times daily.     No current facility-administered medications for this visit.    I,[name],acting as a scribe for Nolia Baumgartner, MD.,have documented all relevant documentation on the behalf of Nolia Baumgartner, MD,as directed by  Nolia Baumgartner, MD while in the presence of Nolia Baumgartner, MD.

## 2023-10-27 ENCOUNTER — Inpatient Hospital Stay

## 2023-10-28 ENCOUNTER — Telehealth: Payer: Self-pay

## 2023-10-28 ENCOUNTER — Inpatient Hospital Stay: Attending: Oncology | Admitting: Oncology

## 2023-10-28 ENCOUNTER — Inpatient Hospital Stay

## 2023-10-28 ENCOUNTER — Encounter: Payer: Self-pay | Admitting: Oncology

## 2023-10-28 VITALS — BP 105/68 | HR 70 | Temp 97.8°F | Resp 18 | Ht 60.24 in | Wt 115.4 lb

## 2023-10-28 DIAGNOSIS — C8302 Small cell B-cell lymphoma, intrathoracic lymph nodes: Secondary | ICD-10-CM | POA: Diagnosis not present

## 2023-10-28 DIAGNOSIS — D702 Other drug-induced agranulocytosis: Secondary | ICD-10-CM

## 2023-10-28 DIAGNOSIS — Z8572 Personal history of non-Hodgkin lymphomas: Secondary | ICD-10-CM | POA: Diagnosis not present

## 2023-10-28 DIAGNOSIS — Z7969 Long term (current) use of other immunomodulators and immunosuppressants: Secondary | ICD-10-CM | POA: Insufficient documentation

## 2023-10-28 DIAGNOSIS — Z79899 Other long term (current) drug therapy: Secondary | ICD-10-CM | POA: Diagnosis not present

## 2023-10-28 DIAGNOSIS — C9110 Chronic lymphocytic leukemia of B-cell type not having achieved remission: Secondary | ICD-10-CM | POA: Diagnosis not present

## 2023-10-28 DIAGNOSIS — D519 Vitamin B12 deficiency anemia, unspecified: Secondary | ICD-10-CM | POA: Diagnosis not present

## 2023-10-28 DIAGNOSIS — Z8673 Personal history of transient ischemic attack (TIA), and cerebral infarction without residual deficits: Secondary | ICD-10-CM | POA: Diagnosis not present

## 2023-10-28 LAB — URIC ACID: Uric Acid, Serum: 5.2 mg/dL (ref 2.5–7.1)

## 2023-10-28 LAB — CMP (CANCER CENTER ONLY)
ALT: 44 U/L (ref 0–44)
AST: 33 U/L (ref 15–41)
Albumin: 4 g/dL (ref 3.5–5.0)
Alkaline Phosphatase: 66 U/L (ref 38–126)
Anion gap: 8 (ref 5–15)
BUN: 17 mg/dL (ref 8–23)
CO2: 27 mmol/L (ref 22–32)
Calcium: 9.5 mg/dL (ref 8.9–10.3)
Chloride: 100 mmol/L (ref 98–111)
Creatinine: 0.85 mg/dL (ref 0.44–1.00)
GFR, Estimated: >60 mL/min (ref >60)
Glucose, Bld: 107 mg/dL — ABNORMAL HIGH (ref 70–99)
Potassium: 4.9 mmol/L (ref 3.5–5.1)
Sodium: 135 mmol/L (ref 135–145)
Total Bilirubin: 0.4 mg/dL (ref 0.0–1.2)
Total Protein: 6.3 g/dL — ABNORMAL LOW (ref 6.5–8.1)

## 2023-10-28 LAB — CBC WITH DIFFERENTIAL (CANCER CENTER ONLY)
Abs Immature Granulocytes: 0.01 10*3/uL (ref 0.00–0.07)
Basophils Absolute: 0 10*3/uL (ref 0.0–0.1)
Basophils Relative: 0 %
Eosinophils Absolute: 0 10*3/uL (ref 0.0–0.5)
Eosinophils Relative: 1 %
HCT: 33 % — ABNORMAL LOW (ref 36.0–46.0)
Hemoglobin: 11.2 g/dL — ABNORMAL LOW (ref 12.0–15.0)
Immature Granulocytes: 0 %
Lymphocytes Relative: 48 %
Lymphs Abs: 1.1 10*3/uL (ref 0.7–4.0)
MCH: 34.9 pg — ABNORMAL HIGH (ref 26.0–34.0)
MCHC: 33.9 g/dL (ref 30.0–36.0)
MCV: 102.8 fL — ABNORMAL HIGH (ref 80.0–100.0)
Monocytes Absolute: 0.1 10*3/uL (ref 0.1–1.0)
Monocytes Relative: 4 %
Neutro Abs: 1.1 10*3/uL — ABNORMAL LOW (ref 1.7–7.7)
Neutrophils Relative %: 47 %
Platelet Count: 133 10*3/uL — ABNORMAL LOW (ref 150–400)
RBC: 3.21 MIL/uL — ABNORMAL LOW (ref 3.87–5.11)
RDW: 14.5 % (ref 11.5–15.5)
WBC Count: 2.4 10*3/uL — ABNORMAL LOW (ref 4.0–10.5)
nRBC: 0 % (ref 0.0–0.2)

## 2023-10-28 LAB — PHOSPHORUS: Phosphorus: 4.6 mg/dL (ref 2.5–4.6)

## 2023-10-28 NOTE — Telephone Encounter (Signed)
 I spoke with Dawn Prince. She has noticed increased fatigue, feels like she gets full quicker, and maybe a little SOB sometimes when exerts self. She is taking the Venclexta   between 8a-830a. No missed doses. She states she is eating, drinking and urinating well. No emesis. Has rare nausea -"feels like pregnant nausea, once you eat a little something it goes away" per pt. She has never had to take antiemetic. No emesis. She has a rash that is cyclic, and was present before she started taking Venclexta . She had been to an allergy doctor, who had suggested some medication, but she didn't want to start anything new @ present. She does take zyrtec BID for the rash. She describes it as "it's like I get whelps on my leg and when I take the medication it goes away within 1 hour. I do better the closer I keep to the scheduled regular dose of Venclexta " Dr Almer Jacobson is aware of the rash- and has seen pictures from pt or spouse's phone. She rates her hip pain 6/10, this is baseline pain prior to start of treatment.  Pt mentioned she feels things are pretty much going as expected with this treatment. She began the Venclexta  50mg  qd dose yesterday, 10/27/2023. Pt reminded to call us  if she were to develop temp of 100.4, day or night. Spouse and pt voiced understanding.

## 2023-11-03 ENCOUNTER — Other Ambulatory Visit

## 2023-11-04 ENCOUNTER — Inpatient Hospital Stay: Admitting: Oncology

## 2023-11-04 ENCOUNTER — Inpatient Hospital Stay (HOSPITAL_BASED_OUTPATIENT_CLINIC_OR_DEPARTMENT_OTHER): Admitting: Hematology and Oncology

## 2023-11-04 ENCOUNTER — Inpatient Hospital Stay

## 2023-11-04 ENCOUNTER — Encounter: Payer: Self-pay | Admitting: Hematology and Oncology

## 2023-11-04 VITALS — BP 145/77 | HR 62 | Temp 98.5°F | Resp 18 | Ht 60.24 in | Wt 113.5 lb

## 2023-11-04 DIAGNOSIS — C9110 Chronic lymphocytic leukemia of B-cell type not having achieved remission: Secondary | ICD-10-CM | POA: Diagnosis not present

## 2023-11-04 DIAGNOSIS — Z7969 Long term (current) use of other immunomodulators and immunosuppressants: Secondary | ICD-10-CM | POA: Diagnosis not present

## 2023-11-04 DIAGNOSIS — Z8673 Personal history of transient ischemic attack (TIA), and cerebral infarction without residual deficits: Secondary | ICD-10-CM | POA: Diagnosis not present

## 2023-11-04 DIAGNOSIS — Z8572 Personal history of non-Hodgkin lymphomas: Secondary | ICD-10-CM | POA: Diagnosis not present

## 2023-11-04 DIAGNOSIS — D519 Vitamin B12 deficiency anemia, unspecified: Secondary | ICD-10-CM

## 2023-11-04 DIAGNOSIS — C8302 Small cell B-cell lymphoma, intrathoracic lymph nodes: Secondary | ICD-10-CM | POA: Diagnosis not present

## 2023-11-04 DIAGNOSIS — Z79899 Other long term (current) drug therapy: Secondary | ICD-10-CM | POA: Diagnosis not present

## 2023-11-04 LAB — CBC WITH DIFFERENTIAL (CANCER CENTER ONLY)
Abs Immature Granulocytes: 0 10*3/uL (ref 0.00–0.07)
Basophils Absolute: 0 10*3/uL (ref 0.0–0.1)
Basophils Relative: 0 %
Eosinophils Absolute: 0 10*3/uL (ref 0.0–0.5)
Eosinophils Relative: 1 %
HCT: 33.7 % — ABNORMAL LOW (ref 36.0–46.0)
Hemoglobin: 11.4 g/dL — ABNORMAL LOW (ref 12.0–15.0)
Immature Granulocytes: 0 %
Lymphocytes Relative: 26 %
Lymphs Abs: 0.7 10*3/uL (ref 0.7–4.0)
MCH: 34.9 pg — ABNORMAL HIGH (ref 26.0–34.0)
MCHC: 33.8 g/dL (ref 30.0–36.0)
MCV: 103.1 fL — ABNORMAL HIGH (ref 80.0–100.0)
Monocytes Absolute: 0.1 10*3/uL (ref 0.1–1.0)
Monocytes Relative: 5 %
Neutro Abs: 1.7 10*3/uL (ref 1.7–7.7)
Neutrophils Relative %: 68 %
Platelet Count: 150 10*3/uL (ref 150–400)
RBC: 3.27 MIL/uL — ABNORMAL LOW (ref 3.87–5.11)
RDW: 14.3 % (ref 11.5–15.5)
WBC Count: 2.5 10*3/uL — ABNORMAL LOW (ref 4.0–10.5)
nRBC: 0 % (ref 0.0–0.2)

## 2023-11-04 LAB — CMP (CANCER CENTER ONLY)
ALT: 56 U/L — ABNORMAL HIGH (ref 0–44)
AST: 41 U/L (ref 15–41)
Albumin: 4.3 g/dL (ref 3.5–5.0)
Alkaline Phosphatase: 67 U/L (ref 38–126)
Anion gap: 9 (ref 5–15)
BUN: 17 mg/dL (ref 8–23)
CO2: 24 mmol/L (ref 22–32)
Calcium: 9.4 mg/dL (ref 8.9–10.3)
Chloride: 103 mmol/L (ref 98–111)
Creatinine: 0.71 mg/dL (ref 0.44–1.00)
GFR, Estimated: >60 mL/min (ref >60)
Glucose, Bld: 112 mg/dL — ABNORMAL HIGH (ref 70–99)
Potassium: 4.3 mmol/L (ref 3.5–5.1)
Sodium: 136 mmol/L (ref 135–145)
Total Bilirubin: 0.3 mg/dL (ref 0.0–1.2)
Total Protein: 6.7 g/dL (ref 6.5–8.1)

## 2023-11-04 LAB — URIC ACID: Uric Acid, Serum: 2.5 mg/dL (ref 2.5–7.1)

## 2023-11-04 LAB — PHOSPHORUS: Phosphorus: 3.7 mg/dL (ref 2.5–4.6)

## 2023-11-04 NOTE — Assessment & Plan Note (Addendum)
 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  She had been on observation only. She has had stable bilateral cervical and inguinal lymphadenopathy.  MRI imaging from February 2023 revealed extensive inguinal and retroperitoneal lymphadenopathy.  Her physical exam reveals a modest change in her cervical and supraclavicular adenopathy. PET scan in March 2023 revealed continued stability of mild adenopathy in the neck, chest, abdomen and pelvis with low level FDG and Deauville 2-3 category uptake. CT imaging in September 2023 shows the numerous mildly enlarged nodes to be unchanged. Her lymphadenopathy is relatively stable at this time but just mildly increased. CT chest, abdomen and pelvis in June 2024 revealed stable bilateral axillary and supraclavicular adenopathy, no mediastinal lymphadenopathy, with a new band of linear consolidation in the left upper lobe, felt to be post infectious consolidation. She has stable retroperitoneal and proximal iliac lymphadenopathy, and no new adenopathy in the abdomen and pelvis.  There is a normal spleen and no skeletal lesions. She has progression of her lymphadenopathy by exam and by CT scan.  We have started her on venetoclax  with a very slowly escalating dose of 20 mg daily for 1 week, 50 mg daily for 1 week, 100 mg daily for 1 week, and then 200 mg daily.  She is seen weekly with labs including phosphorus and uric acid.  Baseline quantitative immunoglobulins and TSH were normal.   She increased the venetoclax  to 100 mg yesterday without difficulty.  WBCs remain low, but ANC normal. Hemoglobin is stable. Platelets are improved. Phosphorus and uric acid remain normal.  I will plan to see her back in 1 weeks with repeat labs.

## 2023-11-04 NOTE — Assessment & Plan Note (Signed)
 B12 deficiency diagnosed in February 2023.  She was treated with B12 injections then transitioned to oral B12.  B12 was discontinued in May 2023 as her B12 level was greater than 2000 at her Wellness visit.  She remained off B12, until the B12 level dropped to 350 in March 2024. We then resumed oral B12. By June 2024 the B12 level was over 1000, so B12 was discontinued.  Her B12 level was 533 in February 2025.

## 2023-11-04 NOTE — Progress Notes (Signed)
 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  She had been on observation only. She has had stable bilateral cervical and inguinal lymphadenopathy.  MRI imaging from February 2023 revealed extensive inguinal and retroperitoneal lymphadenopathy.  Her physical exam reveals a modest change in her cervical and supraclavicular adenopathy. PET scan in March 2023 revealed continued stability of mild adenopathy in the neck, chest, abdomen and pelvis with low level FDG and Deauville 2-3 category uptake. CT imaging in September 2023 shows the numerous mildly enlarged nodes to be unchanged. Her lymphadenopathy is relatively stable at this time but just mildly increased. CT chest, abdomen and pelvis in June 2024 revealed stable bilateral axillary and supraclavicular adenopathy, no mediastinal lymphadenopathy, with a new band of linear consolidation in the left upper lobe, felt to be post infectious consolidation. She has stable retroperitoneal and proximal iliac lymphadenopathy, and no new adenopathy in the abdomen and pelvis.  There is a normal spleen and no skeletal lesions. She has progression of her lymphadenopathy by exam and by CT scan.  We have started her on venetoclax  with a very slowly escalating dose of 20 mg daily for 1 week, 50 mg daily for 1 week, 100 mg daily for 1 week, and then 200 mg daily.  She is seen weekly with labs including phosphorus and uric acid.  Baseline quantitative immunoglobulins and TSH were normal.   She increased the venetoclax  to 100 mg yesterday without difficulty.  WBCs remain low, but ANC normal. Hemoglobin is stable. Platelets are improved. Phosphorus and uric acid remain normal.  I will plan to see  her back in 1 weeks with repeat labs.   B12 deficiency anemia B12 deficiency diagnosed in February 2023.  She was treated with B12 injections then transitioned to oral B12.  B12 was discontinued in May 2023 as her B12 level was greater than 2000 at her Wellness visit.  She remained off B12, until the B12 level dropped to 350 in March 2024. We then resumed oral B12. By June 2024 the B12 level was over 1000, so B12 was discontinued.  Her B12 level was 533 in February 2025.    The patient understands the plans discussed today and is in agreement with them.  She knows to contact our office if she develops concerns prior to her next appointment.   I provided 15 minutes of face-to-face time during this encounter and > 50% was spent counseling as documented under my assessment and plan.    Dawn Prince A Dawn Barhorst, PA-C  [facility] [place] - [facility] DE[facility] OF [facility] [address] [place] [address] Dept: [phone] Dept Fax: [phone]   No orders of the defined types were placed in this encounter.     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)  11/11/2015 Cancer Staging   Staging form: Hodgkin and Non-Hodgkin Lymphoma, AJCC 8th Edition - Clinical stage from 11/11/2015: Stage III (Small lymphocytic leukemia) - Signed by Nolia Baumgartner, MD on 01/28/2021 Histopathologic type: Malignant lymphoma, small B lymphocytic, NOS (see also M-9823/3) Stage prefix: Initial diagnosis  Diagnostic confirmation: Positive histology PLUS positive immunophenotyping and/or positive genetic studies Specimen type: Core Needle Biopsy Staged by: Managing physician Stage used in treatment planning: Yes National guidelines used in treatment planning: Yes Type of national guideline used in treatment planning:  NCCN Staging comments: Watchful waiting   06/20/2020 Initial Diagnosis   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)   Chronic lymphocytic leukemia (CLL), B-cell (HCC)  10/31/2015 Initial Diagnosis   Chronic lymphocytic leukemia (CLL), B-cell (HCC)   11/11/2015 Cancer Staging   Staging form: Chronic Lymphocytic Leukemia / Small Lymphocytic Lymphoma, AJCC 8th Edition - Clinical stage from 11/11/2015: Modified Rai Stage III (Modified Rai risk: High, Binet: Stage B, Lugano: Stage III, Lymphocytosis: Absent, Adenopathy: Present, Organomegaly: Absent, Anemia: Absent, Thrombocytopenia: Absent) - Signed by Nolia Baumgartner, MD on 04/05/2021 Histopathologic type: B-cell lymphocytic leukemia/small lymphocytic lymphoma (see also M-9670/3) Stage prefix: Initial diagnosis Stage used in treatment planning: Yes National guidelines used in treatment planning: Yes Type of national guideline used in treatment planning: NCCN       INTERVAL HISTORY:   Dawn Prince is here today for repeat clinical assessment. She increased venetoclax  to 100 mg daily yesterday without difficulty. She has intermittent mild diarrhea. She reports occasional nausea which resolves with eating.  She reports continued night sweats and palpable adenopathy.  She denies fevers or chills. She states her left hip pain is better this week. Her appetite is good. Her weight has been stable.  She had pre-existing recurrent rashes which are controlled with Pepcid .  REVIEW OF SYSTEMS:   Review of Systems  Constitutional:  Positive for diaphoresis (night sweats). Negative for appetite change, chills, fatigue, fever and unexpected weight change.  HENT:   Negative for lump/mass, mouth sores and sore throat.   Respiratory:  Negative for cough and shortness of breath.   Cardiovascular:  Negative for chest pain and leg swelling.  Gastrointestinal:  Positive for diarrhea. Negative for abdominal pain, constipation, nausea and  vomiting.  Genitourinary:  Negative for difficulty urinating, dysuria, frequency, hematuria and vaginal bleeding.   Musculoskeletal:  Positive for arthralgias (left hip). Negative for back pain, gait problem, myalgias and neck pain.  Skin:  Positive for itching and rash (controlled with medication).  Neurological:  Negative for dizziness, extremity weakness, gait problem, headaches, light-headedness and numbness.  Hematological:  Negative for adenopathy. Does not bruise/bleed easily.  Psychiatric/Behavioral:  Negative for depression and sleep disturbance. The patient is not nervous/anxious.      VITALS:   Blood pressure (!) 145/77, pulse 62, temperature 98.5 F (36.9 C), temperature source Oral, resp. rate 18, height 5' 0.24" (1.53 m), weight 113 lb 8 oz (51.5 kg), SpO2 99%.  Wt Readings from Last 3 Encounters:  11/04/23 113 lb 8 oz (51.5 kg)  10/28/23 115 lb 6.4 oz (52.3 kg)  10/14/23 113 lb 6.4 oz (51.4 kg)    Body mass index is 21.99 kg/m.  Performance status (ECOG): 0 - Asymptomatic  PHYSICAL EXAM:   Physical Exam Vitals and nursing note reviewed.  Constitutional:      General: She is not in acute distress.    Appearance: Normal appearance.  HENT:     Head: Normocephalic and atraumatic.     Mouth/Throat:     Mouth: Mucous membranes are moist.     Pharynx: Oropharynx is clear. No oropharyngeal exudate or posterior oropharyngeal erythema.  Eyes:     General: No scleral icterus.    Extraocular Movements: Extraocular movements intact.     Conjunctiva/sclera:  Conjunctivae normal.     Pupils: Pupils are equal, round, and reactive to light.  Cardiovascular:     Rate and Rhythm: Normal rate and regular rhythm.     Heart sounds: Normal heart sounds. No murmur heard.    No friction rub. No gallop.  Pulmonary:     Effort: Pulmonary effort is normal.     Breath sounds: Normal breath sounds. No wheezing, rhonchi or rales.  Abdominal:     General: There is no distension.      Palpations: Abdomen is soft. There is no hepatomegaly, splenomegaly or mass.     Tenderness: There is no abdominal tenderness.  Musculoskeletal:        General: Normal range of motion.     Cervical back: Normal range of motion and neck supple. No tenderness.     Right lower leg: No edema.     Left lower leg: No edema.  Lymphadenopathy:     Cervical: Cervical adenopathy present.     Upper Body:     Right upper body: Supraclavicular adenopathy and axillary adenopathy present.     Left upper body: Supraclavicular adenopathy and axillary adenopathy present.     Lower Body: No right inguinal adenopathy. Left inguinal adenopathy present.     Comments: Decreased left posterior cervical node measuring about 1.5 cm.    Persistent multiple bilateral cervical and supraclavicular nodes measuring 1 cm. Left axillary node measuring about 2 cm.  Three nodes in the right axilla measuring about 1 cm.  Two left inguinal lymph nodes, the largest decreased measuring about 1.5, the 2nd measuring about 1 cm.  Skin:    General: Skin is warm and dry.     Coloration: Skin is not jaundiced.     Findings: No rash.  Neurological:     Mental Status: She is alert and oriented to person, place, and time.     Cranial Nerves: No cranial nerve deficit.  Psychiatric:        Mood and Affect: Mood normal.        Behavior: Behavior normal.        Thought Content: Thought content normal.     LABS:      Latest Ref Rng & Units 11/04/2023   11:13 AM 10/28/2023    2:20 PM 10/15/2023   10:23 AM  CBC  WBC 4.0 - 10.5 K/uL 2.5  2.4  7.0   Hemoglobin 12.0 - 15.0 g/dL 82.9  56.2  13.0   Hematocrit 36.0 - 46.0 % 33.7  33.0  35.0   Platelets 150 - 400 K/uL 150  133  139       Latest Ref Rng & Units 11/04/2023   11:13 AM 10/28/2023    2:20 PM 10/21/2023    2:51 PM  CMP  Glucose 70 - 99 mg/dL 865  784  84   BUN 8 - 23 mg/dL 17  17  15    Creatinine 0.44 - 1.00 mg/dL 6.96  2.95  2.84   Sodium 135 - 145 mmol/L 136  135  139    Potassium 3.5 - 5.1 mmol/L 4.3  4.9  4.4   Chloride 98 - 111 mmol/L 103  100  102   CO2 22 - 32 mmol/L 24  27  26    Calcium  8.9 - 10.3 mg/dL 9.4  9.5  9.5   Total Protein 6.5 - 8.1 g/dL 6.7  6.3  6.1   Total Bilirubin 0.0 - 1.2 mg/dL 0.3  0.4  0.3  Alkaline Phos 38 - 126 U/L 67  66  73   AST 15 - 41 U/L 41  33  69   ALT 0 - 44 U/L 56  44  67      Lab Results  Component Value Date   TIBC 322 12/12/2022   TIBC 331 07/23/2021   FERRITIN 19 12/12/2022   FERRITIN 22 07/23/2021   IRONPCTSAT 30 12/12/2022   IRONPCTSAT 20 07/23/2021   Lab Results  Component Value Date   LDH 163 10/15/2023   LDH 162 07/01/2023   LDH 136 10/30/2021    STUDIES:   No results found.    HISTORY:   Past Medical History:  Diagnosis Date   Allergy    seasonal   Arthritis    Bipolar 1 disorder (HCC)    Cancer (HCC)    non hodgkins lymphoma   Depression    GERD (gastroesophageal reflux disease)    History of degenerative disc disease    Hyperlipidemia    Increased risk of breast cancer 04/25/2021   Obstructive sleep apnea    Osteopenia    Scoliosis    Thyroid  disease    hypothyroidism   Urticaria    Vitamin D deficiency     Past Surgical History:  Procedure Laterality Date   BREAST BIOPSY     BUBBLE STUDY  02/13/2021   Procedure: BUBBLE STUDY;  Surgeon: Maudine Sos, MD;  Location[facility] ENDOSCOPY;  Service: Cardiovascular;;   CARPAL TUNNEL RELEASE Bilateral 2002   CESAREAN SECTION     x2   COLONOSCOPY  07/26/2008   Melanosis coli. Small internal hemorrhoids.    ENDOSCOPIC PLANTAR FASCIOTOMY     ESOPHAGOGASTRODUODENOSCOPY  03/17/2013   Mild gastritis. Status post esophageal dilatation.   LYMPH NODE BIOPSY     right hip repair torn tendon Right 09/2021   TEE WITHOUT CARDIOVERSION N/A 02/13/2021   Procedure: TRANSESOPHAGEAL ECHOCARDIOGRAM (TEE);  Surgeon: Maudine Sos, MD;  Location: [facility] ENDOSCOPY;  Service: Cardiovascular;  Laterality: N/A;   WISDOM TOOTH EXTRACTION       Family History  Problem Relation Age of Onset   Prostate cancer Father 22   Melanoma Father 63   High blood pressure Father    Breast cancer Maternal Aunt    Multiple myeloma Maternal Aunt    Breast cancer Maternal Aunt    Colon cancer Neg Hx    Colon polyps Neg Hx    Esophageal cancer Neg Hx    Rectal cancer Neg Hx    Stomach cancer Neg Hx     Social History:  reports that she has never smoked. She has never used smokeless tobacco. She reports that she does not currently use alcohol. She reports that she does not use drugs.The patient is accompanied by her husband today.  Allergies:  Allergies  Allergen Reactions   Diclofenac Sodium     Thought it had something to do with her having a stroke   Voltaren [Diclofenac Sodium]     Thought it had something to do with her having a stroke   Nsaids     Had a stroke and worries it was related to the Diclofenac she took.    Current Medications: Current Outpatient Medications  Medication Sig Dispense Refill   Ascorbic Acid (VITAMIN C) 1000 MG tablet Take 1,000 mg by mouth 2 (two) times daily.     Zinc 50 MG TABS Take 1 tablet by mouth 2 (two) times daily.     atorvastatin  (LIPITOR) 80 MG  tablet Take 1 tablet (80 mg total) by mouth daily. 30 tablet 5   cetirizine (ZYRTEC) 10 MG tablet Take 10 mg by mouth 2 (two) times daily.     estradiol (ESTRACE) 0.1 MG/GM vaginal cream 2 (two) times a week. (Patient not taking: Reported on 11/04/2023)     famotidine  (PEPCID ) 20 MG tablet Take 1 tablet (20 mg total) by mouth 2 (two) times daily. 60 tablet 5   FLUoxetine  HCl (PROZAC  PO) Take 60 mg by mouth daily.     Multiple Vitamin (MULTIVITAMIN ADULT PO) Take by mouth. Vision MD once a day     ondansetron  (ZOFRAN -ODT) 4 MG disintegrating tablet Take 1 tablet (4 mg total) by mouth every 8 (eight) hours as needed for nausea or vomiting. 20 tablet 2   PLAVIX  75 MG tablet Take 75 mg by mouth daily.     Probiotic Product (PROBIOTIC BLEND PO) Take by  mouth at bedtime.     ramipril  (ALTACE ) 2.5 MG capsule Take by mouth. 1 in am in 1 in pm     venetoclax  (VENCLEXTA ) 10 & 50 & 100 MG Starter Pack Take 20 mg daily for 7 days, then 50 mg daily x 7d, then 100 mg daily x 7d, then 200 mg daily x 7d. Take with food & water. 42 tablet 0   No current facility-administered medications for this visit.

## 2023-11-10 ENCOUNTER — Other Ambulatory Visit: Payer: Self-pay

## 2023-11-10 ENCOUNTER — Other Ambulatory Visit (HOSPITAL_COMMUNITY): Payer: Self-pay

## 2023-11-10 ENCOUNTER — Encounter (INDEPENDENT_AMBULATORY_CARE_PROVIDER_SITE_OTHER): Payer: Self-pay

## 2023-11-10 ENCOUNTER — Other Ambulatory Visit: Payer: Self-pay | Admitting: Oncology

## 2023-11-10 ENCOUNTER — Other Ambulatory Visit

## 2023-11-10 DIAGNOSIS — C8302 Small cell B-cell lymphoma, intrathoracic lymph nodes: Secondary | ICD-10-CM

## 2023-11-10 NOTE — Progress Notes (Signed)
 Specialty Pharmacy Refill Coordination Note  MyChart Questionnaire Submission  Dawn Prince is a 63 y.o. female contacted today regarding refills of specialty medication(s) Venclexta .   Patient requested (Patient-Rptd) Delivery   Delivery date: 11/12/23   Verified address: (Patient-Rptd) Dawn Prince [address] [place], [place] [address]  Medication will be filled on 11/11/23.  This fill date is pending response to refill request from provider. Patient is aware and if they have not received fill by intended date, they must follow up with pharmacy.

## 2023-11-11 ENCOUNTER — Other Ambulatory Visit: Payer: Self-pay

## 2023-11-11 ENCOUNTER — Telehealth: Payer: Self-pay

## 2023-11-11 ENCOUNTER — Encounter: Payer: Self-pay | Admitting: Hematology and Oncology

## 2023-11-11 ENCOUNTER — Inpatient Hospital Stay: Admitting: Hematology and Oncology

## 2023-11-11 ENCOUNTER — Inpatient Hospital Stay

## 2023-11-11 ENCOUNTER — Other Ambulatory Visit

## 2023-11-11 ENCOUNTER — Telehealth: Payer: Self-pay | Admitting: Hematology and Oncology

## 2023-11-11 ENCOUNTER — Other Ambulatory Visit (HOSPITAL_COMMUNITY): Payer: Self-pay

## 2023-11-11 VITALS — BP 116/73 | HR 71 | Temp 98.7°F | Resp 18 | Ht 60.24 in | Wt 113.0 lb

## 2023-11-11 DIAGNOSIS — D519 Vitamin B12 deficiency anemia, unspecified: Secondary | ICD-10-CM | POA: Diagnosis not present

## 2023-11-11 DIAGNOSIS — Z79899 Other long term (current) drug therapy: Secondary | ICD-10-CM | POA: Diagnosis not present

## 2023-11-11 DIAGNOSIS — C8302 Small cell B-cell lymphoma, intrathoracic lymph nodes: Secondary | ICD-10-CM | POA: Diagnosis not present

## 2023-11-11 DIAGNOSIS — Z8572 Personal history of non-Hodgkin lymphomas: Secondary | ICD-10-CM | POA: Diagnosis not present

## 2023-11-11 DIAGNOSIS — Z8673 Personal history of transient ischemic attack (TIA), and cerebral infarction without residual deficits: Secondary | ICD-10-CM | POA: Diagnosis not present

## 2023-11-11 DIAGNOSIS — Z7969 Long term (current) use of other immunomodulators and immunosuppressants: Secondary | ICD-10-CM | POA: Diagnosis not present

## 2023-11-11 DIAGNOSIS — C9110 Chronic lymphocytic leukemia of B-cell type not having achieved remission: Secondary | ICD-10-CM | POA: Diagnosis not present

## 2023-11-11 LAB — CMP (CANCER CENTER ONLY)
ALT: 52 U/L — ABNORMAL HIGH (ref 0–44)
AST: 36 U/L (ref 15–41)
Albumin: 4.3 g/dL (ref 3.5–5.0)
Alkaline Phosphatase: 69 U/L (ref 38–126)
Anion gap: 10 (ref 5–15)
BUN: 13 mg/dL (ref 8–23)
CO2: 25 mmol/L (ref 22–32)
Calcium: 9.3 mg/dL (ref 8.9–10.3)
Chloride: 101 mmol/L (ref 98–111)
Creatinine: 0.82 mg/dL (ref 0.44–1.00)
GFR, Estimated: >60 mL/min (ref >60)
Glucose, Bld: 101 mg/dL — ABNORMAL HIGH (ref 70–99)
Potassium: 4.2 mmol/L (ref 3.5–5.1)
Sodium: 135 mmol/L (ref 135–145)
Total Bilirubin: 0.7 mg/dL (ref 0.0–1.2)
Total Protein: 6.6 g/dL (ref 6.5–8.1)

## 2023-11-11 LAB — VITAMIN B12: Vitamin B-12: 330 pg/mL (ref 180–914)

## 2023-11-11 LAB — CBC WITH DIFFERENTIAL (CANCER CENTER ONLY)
Abs Immature Granulocytes: 0.01 10*3/uL (ref 0.00–0.07)
Basophils Absolute: 0 10*3/uL (ref 0.0–0.1)
Basophils Relative: 1 %
Eosinophils Absolute: 0 10*3/uL (ref 0.0–0.5)
Eosinophils Relative: 2 %
HCT: 33.2 % — ABNORMAL LOW (ref 36.0–46.0)
Hemoglobin: 11 g/dL — ABNORMAL LOW (ref 12.0–15.0)
Immature Granulocytes: 1 %
Immature Platelet Fraction: 2.9 % (ref 1.2–8.6)
Lymphocytes Relative: 19 %
Lymphs Abs: 0.2 10*3/uL — ABNORMAL LOW (ref 0.7–4.0)
MCH: 34.1 pg — ABNORMAL HIGH (ref 26.0–34.0)
MCHC: 33.1 g/dL (ref 30.0–36.0)
MCV: 102.8 fL — ABNORMAL HIGH (ref 80.0–100.0)
Monocytes Absolute: 0.1 10*3/uL (ref 0.1–1.0)
Monocytes Relative: 6 %
Neutro Abs: 0.8 10*3/uL — ABNORMAL LOW (ref 1.7–7.7)
Neutrophils Relative %: 71 %
Platelet Count: 125 10*3/uL — ABNORMAL LOW (ref 150–400)
RBC: 3.23 MIL/uL — ABNORMAL LOW (ref 3.87–5.11)
RDW: 13.8 % (ref 11.5–15.5)
WBC Count: 1.1 10*3/uL — ABNORMAL LOW (ref 4.0–10.5)
nRBC: 0 % (ref 0.0–0.2)

## 2023-11-11 LAB — PHOSPHORUS: Phosphorus: 3.7 mg/dL (ref 2.5–4.6)

## 2023-11-11 LAB — URIC ACID: Uric Acid, Serum: 4.1 mg/dL (ref 2.5–7.1)

## 2023-11-11 MED ORDER — VENETOCLAX 100 MG PO TABS
200.0000 mg | ORAL_TABLET | Freq: Every day | ORAL | 5 refills | Status: DC
  Filled 2023-11-11: qty 56, 28d supply, fill #0
  Filled 2023-12-02: qty 56, 28d supply, fill #1
  Filled 2024-01-01: qty 56, 28d supply, fill #2
  Filled 2024-01-23: qty 56, 28d supply, fill #3
  Filled 2024-02-20: qty 56, 28d supply, fill #4
  Filled 2024-03-15 (×2): qty 56, 28d supply, fill #5

## 2023-11-11 MED ORDER — VENETOCLAX 100 MG PO TABS: 200.0000 mg | ORAL_TABLET | Freq: Every day | ORAL | 5 refills | Status: DC

## 2023-11-11 NOTE — Progress Notes (Signed)
 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  She had been on observation only. She has had stable bilateral cervical and inguinal lymphadenopathy.  MRI imaging from February 2023 revealed extensive inguinal and retroperitoneal lymphadenopathy.  Her physical exam reveals a modest change in her cervical and supraclavicular adenopathy. PET scan in March 2023 revealed continued stability of mild adenopathy in the neck, chest, abdomen and pelvis with low level FDG and Deauville 2-3 category uptake. CT imaging in September 2023 shows the numerous mildly enlarged nodes to be unchanged. Her lymphadenopathy is relatively stable at this time but just mildly increased. CT chest, abdomen and pelvis in June 2024 revealed stable bilateral axillary and supraclavicular adenopathy, no mediastinal lymphadenopathy, with a new band of linear consolidation in the left upper lobe, felt to be post infectious consolidation. She has stable retroperitoneal and proximal iliac lymphadenopathy, and no new adenopathy in the abdomen and pelvis.  There is a normal spleen and no skeletal lesions. She has progression of her lymphadenopathy by exam and by CT scan.  We have started her on venetoclax  with a very slowly escalating dose of 20 mg daily for 1 week, 50 mg daily for 1 week, 100 mg daily for 1 week, and then 200 mg daily.  She is currently being seen weekly with labs including phosphorus and uric acid.  Baseline quantitative immunoglobulins and TSH were normal.   The patient continued ramping up her dose, so increased the venetoclax  to 200 mg yesterday without difficulty.  She has significant neutropenia, but without fever or sign of infection, so will  continue venetoclax  200 mg daily.  She knows to contact our office if she develops temperature 100.4 or higher or other signs of infection. We will otherwise plan to see her back in 1 week with a CBC, comprehensive metabolic panel, phosphorus and uric acid.  She and her husband are planning a trip to the beach after her appointment next week if possible.   B12 deficiency anemia B12 deficiency diagnosed in February 2023.  She was treated with B12 injections then transitioned to oral B12.  B12 was discontinued in May 2023 as her B12 level was greater than 2000 at her Wellness visit.  She remained off B12, until the B12 level dropped to 350 in March 2024. We then resumed oral B12. By June 2024, the B12 level was over 1000, so B12 was discontinued.  Her B12 level was 533 in February 2025.  I will add a B12 level today.    The patient understands the plans discussed today and is in agreement with them.  She knows to contact our office if she develops concerns prior to her next appointment.   I provided 20 minutes of face-to-face time during this encounter and > 50% was spent counseling as documented under my assessment and plan.    Dawn Prince A Darroll Bredeson, PA-C  [facility] DE[facility] OF 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)  11/11/2015 Cancer Staging   Staging form: Hodgkin and Non-Hodgkin Lymphoma, AJCC 8th Edition - Clinical stage from 11/11/2015: Stage III (Small lymphocytic leukemia) - Signed  by Nolia Baumgartner, MD on 01/28/2021 Histopathologic type: Malignant lymphoma, small B lymphocytic, NOS (see also M-9823/3) Stage prefix: Initial diagnosis Diagnostic confirmation: Positive histology PLUS positive immunophenotyping and/or positive genetic studies Specimen type: Core Needle Biopsy Staged by: Managing physician Stage used in treatment planning: Yes National guidelines used in treatment planning: Yes Type of national guideline used in treatment planning: NCCN Staging comments: Watchful waiting   06/20/2020 Initial Diagnosis   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)   Chronic lymphocytic leukemia (CLL), B-cell (HCC)  10/31/2015 Initial Diagnosis   Chronic lymphocytic leukemia (CLL), B-cell (HCC)   11/11/2015 Cancer Staging   Staging form: Chronic Lymphocytic Leukemia / Small Lymphocytic Lymphoma, AJCC 8th Edition - Clinical stage from 11/11/2015: Modified Rai Stage III (Modified Rai risk: High, Binet: Stage B, Lugano: Stage III, Lymphocytosis: Absent, Adenopathy: Present, Organomegaly: Absent, Anemia: Absent, Thrombocytopenia: Absent) - Signed by Nolia Baumgartner, MD on 04/05/2021 Histopathologic type: B-cell lymphocytic leukemia/small lymphocytic lymphoma (see also M-9670/3) Stage prefix: Initial diagnosis Stage used in treatment planning: Yes National guidelines used in treatment planning: Yes Type of national guideline used in treatment planning: NCCN       INTERVAL HISTORY:   Dawn Prince is here today for repeat clinical assessment. She states she increased venetoclax  to 200 mg daily yesterday.  She states her night sweats improving.  She states her rash recurred when she missed cetirizine last week, however, she denies breakthrough rash while on cetirizine.  She denies fevers, chills or other sign of infection. She denies pain. Her appetite is good. Her weight has been stable.  REVIEW OF SYSTEMS:   Review of Systems  Constitutional:  Positive  for diaphoresis (decreasing). Negative for appetite change, chills, fatigue, fever and unexpected weight change.  HENT:   Negative for lump/mass, mouth sores and sore throat.   Respiratory:  Negative for cough and shortness of breath.   Cardiovascular:  Negative for chest pain and leg swelling.  Gastrointestinal:  Negative for abdominal pain, constipation, diarrhea, nausea and vomiting.  Endocrine: Negative for hot flashes.  Genitourinary:  Negative for difficulty urinating, dysuria, frequency and hematuria.   Musculoskeletal:  Negative for arthralgias, back pain and myalgias.  Skin:  Negative for rash.  Neurological:  Negative for dizziness and headaches.  Hematological:  Negative for adenopathy. Does not bruise/bleed easily.  Psychiatric/Behavioral:  Negative for depression and sleep disturbance. The patient is not nervous/anxious.      VITALS:   Blood pressure 116/73, pulse 71, temperature 98.7 F (37.1 C), temperature source Oral, resp. rate 18, height 5' 0.24 (1.53 m), weight 113 lb (51.3 kg), SpO2 100%.  Wt Readings from Last 3 Encounters:  11/11/23 113 lb (51.3 kg)  11/04/23 113 lb 8 oz (51.5 kg)  10/28/23 115 lb 6.4 oz (52.3 kg)    Body mass index is 21.89 kg/m.  Performance status (ECOG): 0 - Asymptomatic    PHYSICAL EXAM:   Physical Exam Vitals and nursing note reviewed.  Constitutional:      General: She is not in acute distress.  Appearance: Normal appearance.  HENT:     Head: Normocephalic and atraumatic.     Mouth/Throat:     Mouth: Mucous membranes are moist.     Pharynx: Oropharynx is clear. No oropharyngeal exudate or posterior oropharyngeal erythema.   Eyes:     General: No scleral icterus.    Extraocular Movements: Extraocular movements intact.     Conjunctiva/sclera: Conjunctivae normal.     Pupils: Pupils are equal, round, and reactive to light.    Cardiovascular:     Rate and Rhythm: Normal rate and regular rhythm.     Heart sounds: Normal  heart sounds. No murmur heard.    No friction rub. No gallop.  Pulmonary:     Effort: Pulmonary effort is normal.     Breath sounds: Normal breath sounds. No wheezing, rhonchi or rales.  Abdominal:     General: There is no distension.     Palpations: Abdomen is soft. There is no hepatomegaly, splenomegaly or mass.     Tenderness: There is no abdominal tenderness.   Musculoskeletal:        General: Normal range of motion.     Cervical back: Normal range of motion and neck supple. No tenderness.     Right lower leg: No edema.     Left lower leg: No edema.  Lymphadenopathy:     Cervical: Cervical adenopathy present.     Upper Body:     Right upper body: Supraclavicular adenopathy present. No axillary adenopathy.     Left upper body: Supraclavicular adenopathy and axillary adenopathy present.     Lower Body: No right inguinal adenopathy. Left inguinal adenopathy present.     Comments: Stable left posterior cervical node measuring about 1.5 cm.    Bilateral cervical and supraclavicular nodes either resolved or less than 1 cm. Left axillary node measuring about 2 cm.  Right axillary nodes not easily palpable. Stable left inguinal lymph nodes, the largest decreased measuring about 1.5, the 2nd measuring about 1 cm.     Skin:    General: Skin is warm and dry.     Coloration: Skin is not jaundiced.     Findings: No rash.   Neurological:     Mental Status: She is alert and oriented to person, place, and time.     Cranial Nerves: No cranial nerve deficit.   Psychiatric:        Mood and Affect: Mood normal.        Behavior: Behavior normal.        Thought Content: Thought content normal.    LABS:      Latest Ref Rng & Units 11/11/2023    8:38 AM 11/04/2023   11:13 AM 10/28/2023    2:20 PM  CBC  WBC 4.0 - 10.5 K/uL 1.1  2.5  2.4   Hemoglobin 12.0 - 15.0 g/dL 69.6  29.5  28.4   Hematocrit 36.0 - 46.0 % 33.2  33.7  33.0   Platelets 150 - 400 K/uL 125  150  133       Latest Ref  Rng & Units 11/11/2023    8:38 AM 11/04/2023   11:13 AM 10/28/2023    2:20 PM  CMP  Glucose 70 - 99 mg/dL 132  440  102   BUN 8 - 23 mg/dL 13  17  17    Creatinine 0.44 - 1.00 mg/dL 7.25  3.66  4.40   Sodium 135 - 145 mmol/L 135  136  135   Potassium  3.5 - 5.1 mmol/L 4.2  4.3  4.9   Chloride 98 - 111 mmol/L 101  103  100   CO2 22 - 32 mmol/L 25  24  27    Calcium  8.9 - 10.3 mg/dL 9.3  9.4  9.5   Total Protein 6.5 - 8.1 g/dL 6.6  6.7  6.3   Total Bilirubin 0.0 - 1.2 mg/dL 0.7  0.3  0.4   Alkaline Phos 38 - 126 U/L 69  67  66   AST 15 - 41 U/L 36  41  33   ALT 0 - 44 U/L 52  56  44      Lab Results  Component Value Date   TIBC 322 12/12/2022   TIBC 331 07/23/2021   FERRITIN 19 12/12/2022   FERRITIN 22 07/23/2021   IRONPCTSAT 30 12/12/2022   IRONPCTSAT 20 07/23/2021   Lab Results  Component Value Date   LDH 163 10/15/2023   LDH 162 07/01/2023   LDH 136 10/30/2021    STUDIES:   No results found.    HISTORY:   Past Medical History:  Diagnosis Date   Allergy    seasonal   Arthritis    Bipolar 1 disorder (HCC)    Cancer (HCC)    non hodgkins lymphoma   Depression    GERD (gastroesophageal reflux disease)    History of degenerative disc disease    Hyperlipidemia    Increased risk of breast cancer 04/25/2021   Obstructive sleep apnea    Osteopenia    Scoliosis    Thyroid  disease    hypothyroidism   Urticaria    Vitamin D deficiency     Past Surgical History:  Procedure Laterality Date   BREAST BIOPSY     BUBBLE STUDY  02/13/2021   Procedure: BUBBLE STUDY;  Surgeon: Maudine Sos, MD;  Location: [facility] ENDOSCOPY;  Service: Cardiovascular;;   CARPAL TUNNEL RELEASE Bilateral 2002   CESAREAN SECTION     x2   COLONOSCOPY  07/26/2008   Melanosis coli. Small internal hemorrhoids.    ENDOSCOPIC PLANTAR FASCIOTOMY     ESOPHAGOGASTRODUODENOSCOPY  03/17/2013   Mild gastritis. Status post esophageal dilatation.   LYMPH NODE BIOPSY     right hip repair torn tendon  Right 09/2021   TEE WITHOUT CARDIOVERSION N/A 02/13/2021   Procedure: TRANSESOPHAGEAL ECHOCARDIOGRAM (TEE);  Surgeon: Maudine Sos, MD;  Location: [facility] ENDOSCOPY;  Service: Cardiovascular;  Laterality: N/A;   WISDOM TOOTH EXTRACTION      Family History  Problem Relation Age of Onset   Prostate cancer Father 73   Melanoma Father 16   High blood pressure Father    Breast cancer Maternal Aunt    Multiple myeloma Maternal Aunt    Breast cancer Maternal Aunt    Colon cancer Neg Hx    Colon polyps Neg Hx    Esophageal cancer Neg Hx    Rectal cancer Neg Hx    Stomach cancer Neg Hx     Social History:  reports that she has never smoked. She has never used smokeless tobacco. She reports that she does not currently use alcohol. She reports that she does not use drugs.The patient is accompanied by her husband today.  Allergies:  Allergies  Allergen Reactions   Diclofenac Sodium Other (See Comments)    Thought it had something to do with her having a stroke   Voltaren [Diclofenac Sodium]     Thought it had something to do with her having a stroke  Nsaids Other (See Comments)    Had a stroke and worries it was related to the Diclofenac she took.    Current Medications: Current Outpatient Medications  Medication Sig Dispense Refill   Ascorbic Acid (VITAMIN C) 1000 MG tablet Take 1,000 mg by mouth 2 (two) times daily.     atorvastatin  (LIPITOR) 80 MG tablet Take 1 tablet (80 mg total) by mouth daily. 30 tablet 5   cetirizine (ZYRTEC) 10 MG tablet Take 10 mg by mouth 2 (two) times daily.     estradiol (ESTRACE) 0.1 MG/GM vaginal cream 2 (two) times a week. (Patient not taking: Reported on 11/04/2023)     famotidine  (PEPCID ) 20 MG tablet Take 1 tablet (20 mg total) by mouth 2 (two) times daily. 60 tablet 5   FLUoxetine  HCl (PROZAC  PO) Take 60 mg by mouth daily.     Multiple Vitamin (MULTIVITAMIN ADULT PO) Take by mouth. Vision MD once a day     ondansetron  (ZOFRAN -ODT) 4 MG  disintegrating tablet Take 1 tablet (4 mg total) by mouth every 8 (eight) hours as needed for nausea or vomiting. 20 tablet 2   PLAVIX  75 MG tablet Take 75 mg by mouth daily.     Probiotic Product (PROBIOTIC BLEND PO) Take by mouth at bedtime.     ramipril  (ALTACE ) 2.5 MG capsule Take by mouth. 1 in am in 1 in pm     venetoclax  (VENCLEXTA ) 100 MG tablet Take 2 tablets (200 mg total) by mouth daily. Tablets should be swallowed whole with a meal and a full glass of water. 60 tablet 5   Zinc 50 MG TABS Take 1 tablet by mouth 2 (two) times daily.     No current facility-administered medications for this visit.

## 2023-11-11 NOTE — Telephone Encounter (Signed)
 Pt taking 2 -100mg  tab w/food between 8a-830a daily. No missed doses. She denies mouth sores, N/V, cough, SOB, chest pain, constipation, diarrhea, fever and chills. She reports the hip pain is ok, gets worse if she stands a lot. She was without rash for a few days, so she stopped the zyrtec and Pepcid . However, the rash returned below her waist as before. She restarted the Zyrtec BID, and Pepcid  BID. Night sweats are not as bad per pt. Weight is stable, drinking well. Fatigue continues to be the biggest side effect for her. She does take a nap in the evenings. I reminded them to call if she were to develop temp of 100.4 or higher, day or night. Next shipment of Venclexta  has been scheduled.

## 2023-11-11 NOTE — Telephone Encounter (Signed)
 Patient has been scheduled for follow-up visit per 11/11/23 LOS.  Pt given an appt calendar with date and time.

## 2023-11-11 NOTE — Assessment & Plan Note (Addendum)
 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  She had been on observation only. She has had stable bilateral cervical and inguinal lymphadenopathy.  MRI imaging from February 2023 revealed extensive inguinal and retroperitoneal lymphadenopathy.  Her physical exam reveals a modest change in her cervical and supraclavicular adenopathy. PET scan in March 2023 revealed continued stability of mild adenopathy in the neck, chest, abdomen and pelvis with low level FDG and Deauville 2-3 category uptake. CT imaging in September 2023 shows the numerous mildly enlarged nodes to be unchanged. Her lymphadenopathy is relatively stable at this time but just mildly increased. CT chest, abdomen and pelvis in June 2024 revealed stable bilateral axillary and supraclavicular adenopathy, no mediastinal lymphadenopathy, with a new band of linear consolidation in the left upper lobe, felt to be post infectious consolidation. She has stable retroperitoneal and proximal iliac lymphadenopathy, and no new adenopathy in the abdomen and pelvis.  There is a normal spleen and no skeletal lesions. She has progression of her lymphadenopathy by exam and by CT scan.  We have started her on venetoclax  with a very slowly escalating dose of 20 mg daily for 1 week, 50 mg daily for 1 week, 100 mg daily for 1 week, and then 200 mg daily.  She is currently being seen weekly with labs including phosphorus and uric acid.  Baseline quantitative immunoglobulins and TSH were normal.   The patient continued ramping up her dose, so increased the venetoclax  to 200 mg yesterday without difficulty.  She has significant neutropenia, but without fever or sign of infection, so will continue venetoclax  200 mg daily.  She knows to contact our office if she develops temperature 100.4 or higher or other signs of infection. We will otherwise plan to see her back in 1 week with a CBC, comprehensive metabolic panel, phosphorus and uric acid.  She and her  husband are planning a trip to the beach after her appointment next week if possible.

## 2023-11-11 NOTE — Progress Notes (Signed)
 Specialty Pharmacy Ongoing Clinical Assessment Note  Dawn Prince is a 63 y.o. female who is being followed by the specialty pharmacy service for RxSp Oncology   Patient's specialty medication(s) reviewed today: Venetoclax  (VENCLEXTA )   Missed doses in the last 4 weeks: 0   Patient/Caregiver did not have any additional questions or concerns.   Therapeutic benefit summary: Unable to assess   Adverse events/side effects summary: Experienced adverse events/side effects (fatigue)   Patient's therapy is appropriate to: Continue    Goals Addressed             This Visit's Progress    Maintain optimal adherence to therapy   On track    Patient is initiating therapy. Patient will maintain adherence         Follow up: 3 months  [facility] Specialty Pharmacist

## 2023-11-11 NOTE — Assessment & Plan Note (Addendum)
 B12 deficiency diagnosed in February 2023.  She was treated with B12 injections then transitioned to oral B12.  B12 was discontinued in May 2023 as her B12 level was greater than 2000 at her Wellness visit.  She remained off B12, until the B12 level dropped to 350 in March 2024. We then resumed oral B12. By June 2024, the B12 level was over 1000, so B12 was discontinued.  Her B12 level was 533 in February 2025.  I will add a B12 level today.

## 2023-11-13 ENCOUNTER — Encounter: Payer: Self-pay | Admitting: Oncology

## 2023-11-13 DIAGNOSIS — D702 Other drug-induced agranulocytosis: Secondary | ICD-10-CM | POA: Insufficient documentation

## 2023-11-13 DIAGNOSIS — H903 Sensorineural hearing loss, bilateral: Secondary | ICD-10-CM | POA: Diagnosis not present

## 2023-11-16 NOTE — Progress Notes (Signed)
 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  She has been on observation only. She has had stable bilateral cervical and inguinal lymphadenopathy.  MRI imaging from February 2023 revealed extensive inguinal and retroperitoneal lymphadenopathy.  Her physical exam reveals a modest change in her cervical and supraclavicular adenopathy. PET scan in March of 2023 revealed continued stability of mild adenopathy in the neck, chest, abdomen and pelvis with low level FDG and Deauville 2-3 category uptake. The CT in September of 2023 shows the numerous mildly enlarged nodes to be unchanged. Her lymphadenopathy is relatively stable at this time but just mildly increased. Her CT chest, abdomen and pelvis done on 11/06/22 revealed stable bilateral axillary and supraclavicular adenopathy, no mediastinal lymphadenopathy, a new band of linear consolidation in the LEFT upper lobe, which is probably post infectious consolidation. She has stable retroperitoneal and proximal iliac lymphadenopathy, and no new adenopathy in the abdomen and pelvis.  There is a normal spleen and no skeletal lesions. She has progression of her lymphadenopathy by exam and by CT scan.  We have started her on venetoclax  with a very slowly escalating dose of 20 mg daily for 1 week, 50 mg daily for 1 week, 100 mg daily for 1 week and then 200 mg daily as of last week.  She will need labs weekly including phosphorus and uric acid. Her quantitative immunoglobulins and baseline TSH were normal.    B12 deficiency anemia She was treated with B12 injections then transitioned to oral B12.  She is no longer taking B12 as her B12 level has been elevated at the [facility].   On May 11, B12 was greater than 2000, iron studies and folate were normal.  She remains off B12 as her last level in June was over 1,000. It dropped to 350 in March, 2024 and so I had advised her to get back on oral B-12 and the level was back over 1,000 in June, 2024. We will continue to monitor this.   Pneumonia Left upper lobe, lobar pneumonia in May, 2024, has been treated with multiple antibiotics and is clinically improving. CT scan shows resolution with a band of linear consolidation in the left upper lobe.   CVA She had another stroke in December, 2024 and is now on Plavix  75 mg. She has seen the neurologist and they are working on better control of her blood pressure and cholesterol.     Plan: She has low-grade lymphoma which has finally required treatment after 8 years. She is now up to venetoclax  200 mg daily as of last week.  She had 1 bout of nausea and vomiting but her antiemetic helped.  She states her night sweats are improving.  She states her rash recurred when she missed cetirizine last week, however, she denies breakthrough rash while on cetirizine.  Her white count is down to 1.0 but stable with an ANC of 800.  The hemoglobin did drop from 11-10.4 but her platelet count increased from 125,000-132,000.  She is going on vacation next week and so I think we can skip labs for 1 week CMP is normal other than an SGPT of 47, down from 52.  Physical exam does reveal some decrease in her adenopathy.  I will  see her back in 2 weeks with CBC, CMP, uric acid, and phosphorus.  We will continue weekly appointments for the month of July to monitor her labs and tolerance of treatment.  The patient understands the plans discussed today and is in agreement with them.  She knows to contact our office if she develops concerns prior to her next appointment.   I provided 20 minutes of face-to-face time during this encounter and > 50% was spent counseling as documented under my assessment and plan.     Dawn VEAR Cornish, MD  [facility] DEPT OF [facility] [address] [place] [address] Dept: [phone] Dept Fax: [phone]   No orders of the defined types were placed in this encounter.     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)  11/11/2015 Cancer Staging   Staging form: Hodgkin and Non-Hodgkin Lymphoma, AJCC 8th Edition - Clinical stage from 11/11/2015: Stage III (Small lymphocytic leukemia) - Signed by Prince Dawn VEAR, MD on 01/28/2021 Histopathologic type: Malignant lymphoma, small B lymphocytic, NOS (see also M-9823/3) Stage prefix: Initial diagnosis Diagnostic confirmation: Positive histology PLUS positive immunophenotyping and/or positive genetic studies Specimen type: Core Needle Biopsy Staged by: Managing physician Stage used in treatment planning: Yes National guidelines used in treatment planning: Yes Type of national guideline used in treatment planning: NCCN Staging comments: Watchful waiting   06/20/2020 Initial Diagnosis   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)   Chronic lymphocytic leukemia (CLL), B-cell (HCC)  10/31/2015 Initial Diagnosis   Chronic lymphocytic leukemia (CLL), B-cell (HCC)   11/11/2015 Cancer Staging   Staging form: Chronic Lymphocytic Leukemia / Small Lymphocytic Lymphoma, AJCC 8th Edition - Clinical stage from 11/11/2015: Modified Rai Stage III (Modified Rai risk: High, Binet: Stage B, Lugano: Stage III, Lymphocytosis: Absent, Adenopathy: Present, Organomegaly: Absent, Anemia: Absent, Thrombocytopenia: Absent) - Signed by Prince Dawn VEAR, MD on 04/05/2021 Histopathologic type: B-cell lymphocytic leukemia/small lymphocytic lymphoma (see also M-9670/3) Stage prefix:  Initial diagnosis Stage used in treatment planning: Yes National guidelines used in treatment planning: Yes Type of national guideline used in treatment planning: NCCN       INTERVAL HISTORY:   Moe is here today for repeat clinical assessment for her low-grade lymphoma which has finally required treatment after 8 years. She is now up to venetoclax  200 mg daily yesterday.  She had 1 bout of nausea and vomiting but her antiemetic helped.  She states her night sweats are improving.  She states her rash recurred when she missed cetirizine last week, however, she denies breakthrough rash while on cetirizine.  Her white count is down to 1.0 but stable with an ANC of 800.  The hemoglobin did drop from 11-10.4 but her platelet count increased from 125,000-132,000.  She is going on vacation next week and so I think we can skip labs for 1 week CMP is normal other than an SGPT of 47, down from 52.  Physical exam does reveal some decrease in her adenopathy.  I will see her back in 2 weeks with CBC, CMP, uric acid, and phosphorus.  We will continue weekly appointments for the month of July to monitor her labs and tolerance of treatment.  She denies fevers, chills or other sign of infection. She denies pain.  Her appetite is good. Her weight has been stable.  REVIEW OF SYSTEMS:   Review of Systems  Constitutional:  Negative for appetite change, chills, fatigue, fever and unexpected weight change. Diaphoresis: decreasing. HENT:   Negative for lump/mass, mouth sores and sore throat.   Respiratory:  Negative for cough and shortness of breath.   Cardiovascular:  Negative for chest pain and leg swelling.  Gastrointestinal:  Negative for abdominal pain, constipation, diarrhea, nausea and vomiting.  Endocrine: Negative for hot flashes.  Genitourinary:  Negative for difficulty urinating, dysuria, frequency and hematuria.   Musculoskeletal:  Negative for arthralgias, back pain and myalgias.  Skin:  Negative for  rash.  Neurological:  Negative for dizziness and headaches.  Hematological:  Negative for adenopathy. Does not bruise/bleed easily.  Psychiatric/Behavioral:  Negative for depression and sleep disturbance. The patient is not nervous/anxious.      VITALS:   Blood pressure 122/63, pulse 67, temperature 97.9 F (36.6 C), temperature source Oral, resp. rate 16, height 5' 0.24 (1.53 m), weight 113 lb 3.2 oz (51.3 kg), SpO2 100%.  Wt Readings from Last 3 Encounters:  12/02/23 113 lb 8 oz (51.5 kg)  11/18/23 113 lb 3.2 oz (51.3 kg)  11/11/23 113 lb (51.3 kg)    Body mass index is 21.93 kg/m.  Performance status (ECOG): 0 - Asymptomatic    PHYSICAL EXAM:   Physical Exam Vitals and nursing note reviewed.  Constitutional:      General: She is not in acute distress.    Appearance: Normal appearance.  HENT:     Head: Normocephalic and atraumatic.     Mouth/Throat:     Mouth: Mucous membranes are moist.     Pharynx: Oropharynx is clear. No oropharyngeal exudate or posterior oropharyngeal erythema.  Eyes:     General: No scleral icterus.    Extraocular Movements: Extraocular movements intact.     Conjunctiva/sclera: Conjunctivae normal.     Pupils: Pupils are equal, round, and reactive to light.  Cardiovascular:     Rate and Rhythm: Normal rate and regular rhythm.     Heart sounds: Normal heart sounds. No murmur heard.    No friction rub. No gallop.  Pulmonary:     Effort: Pulmonary effort is normal.     Breath sounds: Normal breath sounds. No wheezing, rhonchi or rales.  Abdominal:     General: There is no distension.     Palpations: Abdomen is soft. There is no hepatomegaly, splenomegaly or mass.     Tenderness: There is no abdominal tenderness.  Musculoskeletal:        General: Normal range of motion.     Cervical back: Normal range of motion and neck supple. No tenderness.     Right lower leg: No edema.     Left lower leg: No edema.  Lymphadenopathy:     Cervical:  Cervical adenopathy present.     Upper Body:     Right upper body: Supraclavicular adenopathy present. No axillary adenopathy.     Left upper body: Supraclavicular adenopathy and axillary adenopathy present.     Lower Body: No right inguinal adenopathy. Left inguinal adenopathy present.     Comments: Stable left posterior cervical node measuring about 1.5 cm.    Bilateral cervical and supraclavicular nodes either resolved or less than 1 cm. Left axillary node measuring about 2 cm.  Right axillary node is down to 1 cm or less. Stable left inguinal lymph nodes, the largest decreased measuring about 1.5, the 2nd measuring  about 1 cm.    Skin:    General: Skin is warm and dry.     Coloration: Skin is not jaundiced.     Findings: No rash.  Neurological:     Mental Status: She is alert and oriented to person, place, and time.     Cranial Nerves: No cranial nerve deficit.  Psychiatric:        Mood and Affect: Mood normal.        Behavior: Behavior normal.        Thought Content: Thought content normal.    LABS:      Latest Ref Rng & Units 12/02/2023   10:29 AM 11/18/2023    9:25 AM 11/11/2023    8:38 AM  CBC  WBC 4.0 - 10.5 K/uL 1.5  1.0  1.1   Hemoglobin 12.0 - 15.0 g/dL 88.2  89.5  88.9   Hematocrit 36.0 - 46.0 % 35.6  31.1  33.2   Platelets 150 - 400 K/uL 153  132  125       Latest Ref Rng & Units 12/02/2023   10:29 AM 11/18/2023    9:25 AM 11/11/2023    8:38 AM  CMP  Glucose 70 - 99 mg/dL 889  88  898   BUN 8 - 23 mg/dL 12  13  13    Creatinine 0.44 - 1.00 mg/dL 9.14  9.28  9.17   Sodium 135 - 145 mmol/L 139  135  135   Potassium 3.5 - 5.1 mmol/L 3.9  4.1  4.2   Chloride 98 - 111 mmol/L 103  100  101   CO2 22 - 32 mmol/L 25  25  25    Calcium  8.9 - 10.3 mg/dL 9.4  9.0  9.3   Total Protein 6.5 - 8.1 g/dL 6.9  6.4  6.6   Total Bilirubin 0.0 - 1.2 mg/dL 0.5  0.4  0.7   Alkaline Phos 38 - 126 U/L 78  70  69   AST 15 - 41 U/L 44  38  36   ALT 0 - 44 U/L 72  47  52      Lab  Results  Component Value Date   TIBC 322 12/12/2022   TIBC 331 07/23/2021   FERRITIN 19 12/12/2022   FERRITIN 22 07/23/2021   IRONPCTSAT 30 12/12/2022   IRONPCTSAT 20 07/23/2021   Lab Results  Component Value Date   LDH 163 10/15/2023   LDH 162 07/01/2023   LDH 136 10/30/2021    STUDIES:   No results found.    HISTORY:   Past Medical History:  Diagnosis Date  . Allergy    seasonal  . Arthritis   . Bipolar 1 disorder (HCC)   . Cancer (HCC)    non hodgkins lymphoma  . Depression   . GERD (gastroesophageal reflux disease)   . History of degenerative disc disease   . Hyperlipidemia   . Increased risk of breast cancer 04/25/2021  . Obstructive sleep apnea   . Osteopenia   . Scoliosis   . Thyroid  disease    hypothyroidism  . Urticaria   . Vitamin D deficiency     Past Surgical History:  Procedure Laterality Date  . BREAST BIOPSY    . BUBBLE STUDY  02/13/2021   Procedure: BUBBLE STUDY;  Surgeon: Raford Riggs, MD;  Location: [facility] ENDOSCOPY;  Service: Cardiovascular;;  . CARPAL TUNNEL RELEASE Bilateral 2002  . CESAREAN SECTION     x2  . COLONOSCOPY  07/26/2008   Melanosis coli. Small internal hemorrhoids.   . ENDOSCOPIC PLANTAR FASCIOTOMY    . ESOPHAGOGASTRODUODENOSCOPY  03/17/2013   Mild gastritis. Status post esophageal dilatation.  SABRA LYMPH NODE BIOPSY    . right hip repair torn tendon Right 09/2021  . TEE WITHOUT CARDIOVERSION N/A 02/13/2021   Procedure: TRANSESOPHAGEAL ECHOCARDIOGRAM (TEE);  Surgeon: Raford Riggs, MD;  Location: [facility] ENDOSCOPY;  Service: Cardiovascular;  Laterality: N/A;  . WISDOM TOOTH EXTRACTION      Family History  Problem Relation Age of Onset  . Prostate cancer Father 48  . Melanoma Father 65  . High blood pressure Father   . Breast cancer Maternal Aunt   . Multiple myeloma Maternal Aunt   . Breast cancer Maternal Aunt   . Colon cancer Neg Hx   . Colon polyps Neg Hx   . Esophageal cancer Neg Hx   . Rectal cancer Neg Hx    . Stomach cancer Neg Hx     Social History:  reports that she has never smoked. She has never used smokeless tobacco. She reports that she does not currently use alcohol. She reports that she does not use drugs.The patient is accompanied by her husband today.  Allergies:  Allergies  Allergen Reactions  . Diclofenac Sodium Other (See Comments)    Thought it had something to do with her having a stroke  . Voltaren [Diclofenac Sodium]     Thought it had something to do with her having a stroke  . Nsaids Other (See Comments)    Had a stroke and worries it was related to the Diclofenac she took.    Current Medications: Current Outpatient Medications  Medication Sig Dispense Refill  . Ascorbic Acid (VITAMIN C) 1000 MG tablet Take 1,000 mg by mouth 2 (two) times daily.    . atorvastatin  (LIPITOR) 80 MG tablet Take 1 tablet (80 mg total) by mouth daily. 30 tablet 5  . cetirizine (ZYRTEC) 10 MG tablet Take 10 mg by mouth 2 (two) times daily.    SABRA estradiol (ESTRACE) 0.1 MG/GM vaginal cream 2 (two) times a week. (Patient not taking: Reported on 11/04/2023)    . famotidine  (PEPCID ) 20 MG tablet Take 1 tablet (20 mg total) by mouth 2 (two) times daily. 60 tablet 2  . FLUoxetine  HCl (PROZAC  PO) Take 60 mg by mouth daily.    . Multiple Vitamin (MULTIVITAMIN ADULT PO) Take by mouth. Vision MD once a day    . ondansetron  (ZOFRAN -ODT) 4 MG disintegrating tablet Take 1 tablet (4 mg total) by mouth every 8 (eight) hours as needed for nausea or vomiting. 20 tablet 2  . PLAVIX  75 MG tablet Take 75 mg by mouth daily.    . Probiotic Product (PROBIOTIC BLEND PO) Take by mouth at bedtime.    . ramipril  (ALTACE ) 2.5 MG capsule Take by mouth. 1 in am in 1 in pm    . venetoclax  (VENCLEXTA ) 100 MG tablet Take 2 tablets (200 mg total) by mouth daily. Tablets should be swallowed whole with a meal and a full glass of water. 60 tablet 5  . Zinc 50 MG TABS Take 1 tablet by mouth 2 (two) times daily.     No current  facility-administered medications for this visit.

## 2023-11-18 ENCOUNTER — Inpatient Hospital Stay: Admitting: Oncology

## 2023-11-18 ENCOUNTER — Encounter: Payer: Self-pay | Admitting: Oncology

## 2023-11-18 ENCOUNTER — Inpatient Hospital Stay

## 2023-11-18 VITALS — BP 122/63 | HR 67 | Temp 97.9°F | Resp 16 | Ht 60.24 in | Wt 113.2 lb

## 2023-11-18 DIAGNOSIS — Z8673 Personal history of transient ischemic attack (TIA), and cerebral infarction without residual deficits: Secondary | ICD-10-CM | POA: Diagnosis not present

## 2023-11-18 DIAGNOSIS — D519 Vitamin B12 deficiency anemia, unspecified: Secondary | ICD-10-CM | POA: Diagnosis not present

## 2023-11-18 DIAGNOSIS — Z8572 Personal history of non-Hodgkin lymphomas: Secondary | ICD-10-CM | POA: Diagnosis not present

## 2023-11-18 DIAGNOSIS — Z79899 Other long term (current) drug therapy: Secondary | ICD-10-CM | POA: Diagnosis not present

## 2023-11-18 DIAGNOSIS — C9110 Chronic lymphocytic leukemia of B-cell type not having achieved remission: Secondary | ICD-10-CM | POA: Diagnosis not present

## 2023-11-18 DIAGNOSIS — Z7969 Long term (current) use of other immunomodulators and immunosuppressants: Secondary | ICD-10-CM | POA: Diagnosis not present

## 2023-11-18 DIAGNOSIS — C8302 Small cell B-cell lymphoma, intrathoracic lymph nodes: Secondary | ICD-10-CM | POA: Diagnosis not present

## 2023-11-18 LAB — CBC WITH DIFFERENTIAL (CANCER CENTER ONLY)
Abs Immature Granulocytes: 0 10*3/uL (ref 0.00–0.07)
Basophils Absolute: 0 10*3/uL (ref 0.0–0.1)
Basophils Relative: 0 %
Eosinophils Absolute: 0 10*3/uL (ref 0.0–0.5)
Eosinophils Relative: 1 %
HCT: 31.1 % — ABNORMAL LOW (ref 36.0–46.0)
Hemoglobin: 10.4 g/dL — ABNORMAL LOW (ref 12.0–15.0)
Immature Granulocytes: 0 %
Lymphocytes Relative: 12 %
Lymphs Abs: 0.1 10*3/uL — ABNORMAL LOW (ref 0.7–4.0)
MCH: 35.1 pg — ABNORMAL HIGH (ref 26.0–34.0)
MCHC: 33.4 g/dL (ref 30.0–36.0)
MCV: 105.1 fL — ABNORMAL HIGH (ref 80.0–100.0)
Monocytes Absolute: 0.1 10*3/uL (ref 0.1–1.0)
Monocytes Relative: 8 %
Neutro Abs: 0.8 10*3/uL — ABNORMAL LOW (ref 1.7–7.7)
Neutrophils Relative %: 79 %
Platelet Count: 132 10*3/uL — ABNORMAL LOW (ref 150–400)
RBC: 2.96 MIL/uL — ABNORMAL LOW (ref 3.87–5.11)
RDW: 13.7 % (ref 11.5–15.5)
Smear Review: NORMAL
WBC Count: 1 10*3/uL — ABNORMAL LOW (ref 4.0–10.5)
nRBC: 0 % (ref 0.0–0.2)

## 2023-11-18 LAB — CMP (CANCER CENTER ONLY)
ALT: 47 U/L — ABNORMAL HIGH (ref 0–44)
AST: 38 U/L (ref 15–41)
Albumin: 4.1 g/dL (ref 3.5–5.0)
Alkaline Phosphatase: 70 U/L (ref 38–126)
Anion gap: 10 (ref 5–15)
BUN: 13 mg/dL (ref 8–23)
CO2: 25 mmol/L (ref 22–32)
Calcium: 9 mg/dL (ref 8.9–10.3)
Chloride: 100 mmol/L (ref 98–111)
Creatinine: 0.71 mg/dL (ref 0.44–1.00)
GFR, Estimated: >60 mL/min (ref >60)
Glucose, Bld: 88 mg/dL (ref 70–99)
Potassium: 4.1 mmol/L (ref 3.5–5.1)
Sodium: 135 mmol/L (ref 135–145)
Total Bilirubin: 0.4 mg/dL (ref 0.0–1.2)
Total Protein: 6.4 g/dL — ABNORMAL LOW (ref 6.5–8.1)

## 2023-11-18 LAB — URIC ACID: Uric Acid, Serum: 3.2 mg/dL (ref 2.5–7.1)

## 2023-11-18 LAB — PHOSPHORUS: Phosphorus: 3.7 mg/dL (ref 2.5–4.6)

## 2023-11-26 ENCOUNTER — Other Ambulatory Visit: Payer: Self-pay | Admitting: *Deleted

## 2023-11-26 MED ORDER — FAMOTIDINE 20 MG PO TABS: 20.0000 mg | ORAL_TABLET | Freq: Two times a day (BID) | ORAL | 2 refills | Status: DC

## 2023-12-01 ENCOUNTER — Other Ambulatory Visit: Payer: Self-pay

## 2023-12-01 MED ORDER — ATORVASTATIN CALCIUM 80 MG PO TABS: 80.0000 mg | ORAL_TABLET | Freq: Every day | ORAL | 5 refills | Status: DC

## 2023-12-01 NOTE — Assessment & Plan Note (Addendum)
 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  She had been on observation only. She has had stable bilateral cervical and inguinal lymphadenopathy.  MRI imaging from February 2023 revealed extensive inguinal and retroperitoneal lymphadenopathy.  Her physical exam reveals a modest change in her cervical and supraclavicular adenopathy. PET scan in March 2023 revealed continued stability of mild adenopathy in the neck, chest, abdomen and pelvis with low level FDG and Deauville 2-3 category uptake. CT imaging in September 2023 shows the numerous mildly enlarged nodes to be unchanged. Her lymphadenopathy is relatively stable at this time but just mildly increased. CT chest, abdomen and pelvis in June 2024 revealed stable bilateral axillary and supraclavicular adenopathy, no mediastinal lymphadenopathy, with a new band of linear consolidation in the left upper lobe, felt to be post infectious consolidation. She has stable retroperitoneal and proximal iliac lymphadenopathy, and no new adenopathy in the abdomen and pelvis.  There is a normal spleen and no skeletal lesions. She has progression of her lymphadenopathy by exam and by CT scan.  We have started her on venetoclax  with a very slowly escalating dose of 20 mg daily for 1 week, 50 mg daily for 1 week, 100 mg daily for 1 week, and then 200 mg daily.  She is currently being seen weekly with labs including phosphorus and uric acid.  Baseline quantitative immunoglobulins and TSH were normal.   She has been taking venetoclax  200 mg since June 16.  She initially had significant neutropenia, which has improved.  Her hemoglobin and platelets are also improved.  She also continues to have decreasing lymphadenopathy.  She has increase in the transaminases, which has fluctuated up and down.  We will continue to monitor this.  She will continue venetoclax  200 mg daily.  She knows to contact our office if she develops temperature 100.4 or higher or other signs of  infection. We will otherwise plan to see her back in 1 week with a CBC, comprehensive metabolic panel, phosphorus and uric acid.

## 2023-12-01 NOTE — Progress Notes (Signed)
 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  She had been on observation only. She has had stable bilateral cervical and inguinal lymphadenopathy.  MRI imaging from February 2023 revealed extensive inguinal and retroperitoneal lymphadenopathy.  Her physical exam reveals a modest change in her cervical and supraclavicular adenopathy. PET scan in March 2023 revealed continued stability of mild adenopathy in the neck, chest, abdomen and pelvis with low level FDG and Deauville 2-3 category uptake. CT imaging in September 2023 shows the numerous mildly enlarged nodes to be unchanged. Her lymphadenopathy is relatively stable at this time but just mildly increased. CT chest, abdomen and pelvis in June 2024 revealed stable bilateral axillary and supraclavicular adenopathy, no mediastinal lymphadenopathy, with a new band of linear consolidation in the left upper lobe, felt to be post infectious consolidation. She has stable retroperitoneal and proximal iliac lymphadenopathy, and no new adenopathy in the abdomen and pelvis.  There is a normal spleen and no skeletal lesions. She has progression of her lymphadenopathy by exam and by CT scan.  We have started her on venetoclax  with a very slowly escalating dose of 20 mg daily for 1 week, 50 mg daily for 1 week, 100 mg daily for 1 week, and then 200 mg daily.  She is currently being seen weekly with labs including phosphorus and uric acid.  Baseline quantitative immunoglobulins and TSH were normal.   She has been taking venetoclax  200 mg since June 16.  She initially had significant neutropenia, which has improved.  Her hemoglobin and platelets are also improved.  She also continues to have  decreasing lymphadenopathy.  She has increase in the transaminases, which has fluctuated up and down.  We will continue to monitor this.  She will continue venetoclax  200 mg daily.  She knows to contact our office if she develops temperature 100.4 or higher or other signs of infection. We will otherwise plan to see her back in 1 week with a CBC, comprehensive metabolic panel, phosphorus and uric acid.    The patient understands the plans discussed today and is in agreement with them.  She knows to contact our office if she develops concerns prior to her next appointment.   I provided 15 minutes of face-to-face time during this encounter and > 50% was spent counseling as documented under my assessment and plan.    Dawn Jimmerson A Charlissa Petros, PA-C  [facility] [place] [facility] DEPT OF [facility]. [facility] [address] [place] [address] Dept: [phone] Dept Fax: [phone]   No orders of the defined types were placed in this encounter.     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)  11/11/2015 Cancer Staging   Staging form: Hodgkin and Non-Hodgkin Lymphoma, AJCC 8th Edition - Clinical stage from 11/11/2015: Stage III (Small lymphocytic leukemia) - Signed by Cornelius Wanda VEAR, MD on 01/28/2021 Histopathologic type: Malignant lymphoma, small B lymphocytic, NOS (see also M-9823/3) Stage prefix: Initial diagnosis Diagnostic confirmation: Positive histology PLUS positive immunophenotyping and/or positive genetic studies Specimen type: Core Needle Biopsy Staged  by: Managing physician Stage used in treatment planning: Yes National guidelines used in treatment planning: Yes Type of national guideline used in treatment planning: NCCN Staging comments: Watchful waiting   06/20/2020 Initial  Diagnosis   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)   Chronic lymphocytic leukemia (CLL), B-cell (HCC)  10/31/2015 Initial Diagnosis   Chronic lymphocytic leukemia (CLL), B-cell (HCC)   11/11/2015 Cancer Staging   Staging form: Chronic Lymphocytic Leukemia / Small Lymphocytic Lymphoma, AJCC 8th Edition - Clinical stage from 11/11/2015: Modified Rai Stage III (Modified Rai risk: High, Binet: Stage B, Lugano: Stage III, Lymphocytosis: Absent, Adenopathy: Present, Organomegaly: Absent, Anemia: Absent, Thrombocytopenia: Absent) - Signed by Cornelius Wanda DEL, MD on 04/05/2021 Histopathologic type: B-cell lymphocytic leukemia/small lymphocytic lymphoma (see also M-9670/3) Stage prefix: Initial diagnosis Stage used in treatment planning: Yes National guidelines used in treatment planning: Yes Type of national guideline used in treatment planning: NCCN       INTERVAL HISTORY:   Dawn Prince is here today for repeat clinical assessment. She reports easy fatigability.  She was able to enjoy her beach trip.  Her skin rash seems to have resolved, as she has not required medication in 4 days.  She denies fevers, chills or night sweats.   She reports occasional sinus drainage and dry cough, which she attributes to allergies.  She otherwise denies symptoms of infection.  She denies pain. Her appetite is good. Her weight has been stable.  REVIEW OF SYSTEMS:   Review of Systems  Constitutional:  Positive for fever. Negative for appetite change, chills, diaphoresis, fatigue and unexpected weight change.  HENT:   Negative for lump/mass, mouth sores, nosebleeds and sore throat.   Respiratory:  Negative for cough, hemoptysis and shortness of breath.   Cardiovascular:  Negative for chest pain and leg swelling.  Gastrointestinal:  Negative for abdominal pain, blood in stool, constipation, diarrhea, nausea and vomiting.  Endocrine: Negative for hot flashes.  Genitourinary:  Negative for  difficulty urinating, dysuria, frequency, hematuria and vaginal bleeding.   Musculoskeletal:  Positive for arthralgias (left hip). Negative for back pain, gait problem, myalgias and neck pain.  Skin:  Negative for rash.  Neurological:  Positive for headaches (intermittent). Negative for dizziness, extremity weakness, gait problem, light-headedness and numbness.  Hematological:  Negative for adenopathy. Does not bruise/bleed easily.  Psychiatric/Behavioral:  Negative for depression and sleep disturbance. The patient is not nervous/anxious.      VITALS:   Blood pressure 136/80, pulse 68, temperature 97.7 F (36.5 C), temperature source Oral, resp. rate 16, height 5' 0.24 (1.53 m), weight 113 lb 8 oz (51.5 kg), SpO2 100%.  Wt Readings from Last 3 Encounters:  12/02/23 113 lb 8 oz (51.5 kg)  11/18/23 113 lb 3.2 oz (51.3 kg)  11/11/23 113 lb (51.3 kg)    Body mass index is 21.99 kg/m.  Performance status (ECOG): 1 - Symptomatic but completely ambulatory    PHYSICAL EXAM:   Physical Exam Vitals and nursing note reviewed.  Constitutional:      General: She is not in acute distress.    Appearance: Normal appearance.  HENT:     Head: Normocephalic and atraumatic.     Mouth/Throat:     Mouth: Mucous membranes are moist.     Pharynx: Oropharynx is clear. No oropharyngeal exudate or posterior oropharyngeal erythema.  Eyes:     General: No scleral icterus.    Extraocular Movements: Extraocular movements intact.     Conjunctiva/sclera: Conjunctivae normal.  Pupils: Pupils are equal, round, and reactive to light.  Cardiovascular:     Rate and Rhythm: Normal rate and regular rhythm.     Heart sounds: Normal heart sounds. No murmur heard.    No friction rub. No gallop.  Pulmonary:     Effort: Pulmonary effort is normal.     Breath sounds: Normal breath sounds. No wheezing, rhonchi or rales.  Abdominal:     General: There is no distension.     Palpations: Abdomen is soft. There is  no mass.     Tenderness: There is no abdominal tenderness.  Musculoskeletal:        General: Normal range of motion.     Cervical back: Normal range of motion and neck supple. No tenderness.     Right lower leg: No edema.     Left lower leg: No edema.  Lymphadenopathy:     Cervical: No cervical adenopathy.     Upper Body:     Right upper body: No supraclavicular or axillary adenopathy.     Left upper body: Supraclavicular adenopathy present. No axillary adenopathy.     Lower Body: No right inguinal adenopathy. Left inguinal adenopathy present.  Skin:    General: Skin is warm and dry.     Coloration: Skin is not jaundiced.     Findings: No rash.  Neurological:     Mental Status: She is alert and oriented to person, place, and time.     Cranial Nerves: No cranial nerve deficit.  Psychiatric:        Mood and Affect: Mood normal.        Behavior: Behavior normal.        Thought Content: Thought content normal.     LABS:      Latest Ref Rng & Units 12/02/2023   10:29 AM 11/18/2023    9:25 AM 11/11/2023    8:38 AM  CBC  WBC 4.0 - 10.5 K/uL 1.5  1.0  1.1   Hemoglobin 12.0 - 15.0 g/dL 88.2  89.5  88.9   Hematocrit 36.0 - 46.0 % 35.6  31.1  33.2   Platelets 150 - 400 K/uL 153  132  125       Latest Ref Rng & Units 12/02/2023   10:29 AM 11/18/2023    9:25 AM 11/11/2023    8:38 AM  CMP  Glucose 70 - 99 mg/dL 889  88  898   BUN 8 - 23 mg/dL 12  13  13    Creatinine 0.44 - 1.00 mg/dL 9.14  9.28  9.17   Sodium 135 - 145 mmol/L 139  135  135   Potassium 3.5 - 5.1 mmol/L 3.9  4.1  4.2   Chloride 98 - 111 mmol/L 103  100  101   CO2 22 - 32 mmol/L 25  25  25    Calcium  8.9 - 10.3 mg/dL 9.4  9.0  9.3   Total Protein 6.5 - 8.1 g/dL 6.9  6.4  6.6   Total Bilirubin 0.0 - 1.2 mg/dL 0.5  0.4  0.7   Alkaline Phos 38 - 126 U/L 78  70  69   AST 15 - 41 U/L 44  38  36   ALT 0 - 44 U/L 72  47  52      Lab Results  Component Value Date   TIBC 322 12/12/2022   TIBC 331 07/23/2021   FERRITIN  19 12/12/2022   FERRITIN 22 07/23/2021   IRONPCTSAT 30  12/12/2022   IRONPCTSAT 20 07/23/2021   Lab Results  Component Value Date   LDH 163 10/15/2023   LDH 162 07/01/2023   LDH 136 10/30/2021    STUDIES:   No results found.    HISTORY:   Past Medical History:  Diagnosis Date   Allergy    seasonal   Arthritis    Bipolar 1 disorder (HCC)    Cancer (HCC)    non hodgkins lymphoma   Depression    GERD (gastroesophageal reflux disease)    History of degenerative disc disease    Hyperlipidemia    Increased risk of breast cancer 04/25/2021   Obstructive sleep apnea    Osteopenia    Scoliosis    Thyroid  disease    hypothyroidism   Urticaria    Vitamin D deficiency     Past Surgical History:  Procedure Laterality Date   BREAST BIOPSY     BUBBLE STUDY  02/13/2021   Procedure: BUBBLE STUDY;  Surgeon: Raford Riggs, MD;  Location[facility] ENDOSCOPY;  Service: Cardiovascular;;   CARPAL TUNNEL RELEASE Bilateral 2002   CESAREAN SECTION     x2   COLONOSCOPY  07/26/2008   Melanosis coli. Small internal hemorrhoids.    ENDOSCOPIC PLANTAR FASCIOTOMY     ESOPHAGOGASTRODUODENOSCOPY  03/17/2013   Mild gastritis. Status post esophageal dilatation.   LYMPH NODE BIOPSY     right hip repair torn tendon Right 09/2021   TEE WITHOUT CARDIOVERSION N/A 02/13/2021   Procedure: TRANSESOPHAGEAL ECHOCARDIOGRAM (TEE);  Surgeon: Raford Riggs, MD;  Location: [facility] ENDOSCOPY;  Service: Cardiovascular;  Laterality: N/A;   WISDOM TOOTH EXTRACTION      Family History  Problem Relation Age of Onset   Prostate cancer Father 7   Melanoma Father 65   High blood pressure Father    Breast cancer Maternal Aunt    Multiple myeloma Maternal Aunt    Breast cancer Maternal Aunt    Colon cancer Neg Hx    Colon polyps Neg Hx    Esophageal cancer Neg Hx    Rectal cancer Neg Hx    Stomach cancer Neg Hx     Social History:  reports that she has never smoked. She has never used smokeless tobacco.  She reports that she does not currently use alcohol. She reports that she does not use drugs.The patient is accompanied by her husband today.  Allergies:  Allergies  Allergen Reactions   Diclofenac Sodium Other (See Comments)    Thought it had something to do with her having a stroke   Voltaren [Diclofenac Sodium]     Thought it had something to do with her having a stroke   Nsaids Other (See Comments)    Had a stroke and worries it was related to the Diclofenac she took.    Current Medications: Current Outpatient Medications  Medication Sig Dispense Refill   Ascorbic Acid (VITAMIN C) 1000 MG tablet Take 1,000 mg by mouth 2 (two) times daily.     atorvastatin  (LIPITOR) 80 MG tablet Take 1 tablet (80 mg total) by mouth daily. 30 tablet 5   cetirizine (ZYRTEC) 10 MG tablet Take 10 mg by mouth 2 (two) times daily.     estradiol (ESTRACE) 0.1 MG/GM vaginal cream 2 (two) times a week. (Patient not taking: Reported on 11/04/2023)     famotidine  (PEPCID ) 20 MG tablet Take 1 tablet (20 mg total) by mouth 2 (two) times daily. 60 tablet 2   FLUoxetine  HCl (PROZAC  PO) Take 60 mg  by mouth daily.     Multiple Vitamin (MULTIVITAMIN ADULT PO) Take by mouth. Vision MD once a day     ondansetron  (ZOFRAN -ODT) 4 MG disintegrating tablet Take 1 tablet (4 mg total) by mouth every 8 (eight) hours as needed for nausea or vomiting. 20 tablet 2   PLAVIX  75 MG tablet Take 75 mg by mouth daily.     Probiotic Product (PROBIOTIC BLEND PO) Take by mouth at bedtime.     ramipril  (ALTACE ) 2.5 MG capsule Take by mouth. 1 in am in 1 in pm     venetoclax  (VENCLEXTA ) 100 MG tablet Take 2 tablets (200 mg total) by mouth daily. Tablets should be swallowed whole with a meal and a full glass of water. 60 tablet 5   Zinc 50 MG TABS Take 1 tablet by mouth 2 (two) times daily.     No current facility-administered medications for this visit.

## 2023-12-02 ENCOUNTER — Other Ambulatory Visit: Payer: Self-pay | Admitting: Pharmacy Technician

## 2023-12-02 ENCOUNTER — Other Ambulatory Visit: Payer: Self-pay

## 2023-12-02 ENCOUNTER — Encounter (INDEPENDENT_AMBULATORY_CARE_PROVIDER_SITE_OTHER): Payer: Self-pay

## 2023-12-02 ENCOUNTER — Inpatient Hospital Stay: Attending: Hematology and Oncology

## 2023-12-02 ENCOUNTER — Encounter: Payer: Self-pay | Admitting: Hematology and Oncology

## 2023-12-02 ENCOUNTER — Inpatient Hospital Stay (HOSPITAL_BASED_OUTPATIENT_CLINIC_OR_DEPARTMENT_OTHER): Admitting: Hematology and Oncology

## 2023-12-02 VITALS — BP 136/80 | HR 68 | Temp 97.7°F | Resp 16 | Ht 60.24 in | Wt 113.5 lb

## 2023-12-02 DIAGNOSIS — Z79899 Other long term (current) drug therapy: Secondary | ICD-10-CM | POA: Insufficient documentation

## 2023-12-02 DIAGNOSIS — C8302 Small cell B-cell lymphoma, intrathoracic lymph nodes: Secondary | ICD-10-CM

## 2023-12-02 DIAGNOSIS — C9110 Chronic lymphocytic leukemia of B-cell type not having achieved remission: Secondary | ICD-10-CM | POA: Diagnosis not present

## 2023-12-02 DIAGNOSIS — M8580 Other specified disorders of bone density and structure, unspecified site: Secondary | ICD-10-CM | POA: Diagnosis not present

## 2023-12-02 DIAGNOSIS — Z7969 Long term (current) use of other immunomodulators and immunosuppressants: Secondary | ICD-10-CM | POA: Diagnosis not present

## 2023-12-02 DIAGNOSIS — Z7902 Long term (current) use of antithrombotics/antiplatelets: Secondary | ICD-10-CM | POA: Insufficient documentation

## 2023-12-02 LAB — CMP (CANCER CENTER ONLY)
ALT: 72 U/L — ABNORMAL HIGH (ref 0–44)
AST: 44 U/L — ABNORMAL HIGH (ref 15–41)
Albumin: 4.1 g/dL (ref 3.5–5.0)
Alkaline Phosphatase: 78 U/L (ref 38–126)
Anion gap: 11 (ref 5–15)
BUN: 12 mg/dL (ref 8–23)
CO2: 25 mmol/L (ref 22–32)
Calcium: 9.4 mg/dL (ref 8.9–10.3)
Chloride: 103 mmol/L (ref 98–111)
Creatinine: 0.85 mg/dL (ref 0.44–1.00)
GFR, Estimated: >60 mL/min (ref >60)
Glucose, Bld: 110 mg/dL — ABNORMAL HIGH (ref 70–99)
Potassium: 3.9 mmol/L (ref 3.5–5.1)
Sodium: 139 mmol/L (ref 135–145)
Total Bilirubin: 0.5 mg/dL (ref 0.0–1.2)
Total Protein: 6.9 g/dL (ref 6.5–8.1)

## 2023-12-02 LAB — CBC WITH DIFFERENTIAL (CANCER CENTER ONLY)
Abs Immature Granulocytes: 0 K/uL (ref 0.00–0.07)
Basophils Absolute: 0 K/uL (ref 0.0–0.1)
Basophils Relative: 0 %
Eosinophils Absolute: 0 K/uL (ref 0.0–0.5)
Eosinophils Relative: 0 %
HCT: 35.6 % — ABNORMAL LOW (ref 36.0–46.0)
Hemoglobin: 11.7 g/dL — ABNORMAL LOW (ref 12.0–15.0)
Immature Granulocytes: 0 %
Lymphocytes Relative: 8 %
Lymphs Abs: 0.1 K/uL — ABNORMAL LOW (ref 0.7–4.0)
MCH: 35 pg — ABNORMAL HIGH (ref 26.0–34.0)
MCHC: 32.9 g/dL (ref 30.0–36.0)
MCV: 106.6 fL — ABNORMAL HIGH (ref 80.0–100.0)
Monocytes Absolute: 0.2 K/uL (ref 0.1–1.0)
Monocytes Relative: 11 %
Neutro Abs: 1.2 K/uL — ABNORMAL LOW (ref 1.7–7.7)
Neutrophils Relative %: 81 %
Platelet Count: 153 K/uL (ref 150–400)
RBC: 3.34 MIL/uL — ABNORMAL LOW (ref 3.87–5.11)
RDW: 13.5 % (ref 11.5–15.5)
WBC Count: 1.5 K/uL — ABNORMAL LOW (ref 4.0–10.5)
nRBC: 0 % (ref 0.0–0.2)

## 2023-12-02 LAB — URIC ACID: Uric Acid, Serum: 2.8 mg/dL (ref 2.5–7.1)

## 2023-12-02 LAB — PHOSPHORUS: Phosphorus: 4 mg/dL (ref 2.5–4.6)

## 2023-12-02 NOTE — Progress Notes (Signed)
 Specialty Pharmacy Refill Coordination Note  Dawn Prince is a 63 y.o. female contacted today regarding refills of specialty medication(s) Venetoclax  (VENCLEXTA )   Patient requested (Patient-Rptd) Delivery   Delivery date: 12/05/2023 Verified address: (Patient-Rptd) [address]  [place]. [address]   Medication will be filled on 12/04/2023.

## 2023-12-03 ENCOUNTER — Other Ambulatory Visit: Payer: Self-pay

## 2023-12-03 ENCOUNTER — Encounter: Payer: Self-pay | Admitting: Oncology

## 2023-12-04 ENCOUNTER — Telehealth: Payer: Self-pay

## 2023-12-04 NOTE — Telephone Encounter (Signed)
 Spoke with pt to see how she is feeling. She replied, This is the worse week I've had. I just don't feel well. I'm trying to rest and eat bland foods. I'm not sure if I over did it last week at the beach. I haven't had any nausea or vomiting. I had a little diarrhea this morning, but nothing I can't handle. She continues to take the Venclexta  @between  8a-830a. No missed doses. She denies fever, chills, rash, and mouth sores. She mentioned she enjoyed her beach trip last week and felt fine. I reminded her to call us  if she were to develop temp of 100.4 or higher, day or night.

## 2023-12-05 NOTE — Progress Notes (Signed)
 [facility]  [address] 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  She had been on observation only. She has had stable bilateral cervical and inguinal lymphadenopathy.  MRI imaging from February 2023 revealed extensive inguinal and retroperitoneal lymphadenopathy.  Her physical exam reveals a modest change in her cervical and supraclavicular adenopathy. PET scan in March 2023 revealed continued stability of mild adenopathy in the neck, chest, abdomen and pelvis with low level FDG and Deauville 2-3 category uptake. CT imaging in September 2023 shows the numerous mildly enlarged nodes to be unchanged. Her lymphadenopathy is relatively stable at this time but just mildly increased. CT chest, abdomen and pelvis in June 2024 revealed stable bilateral axillary and supraclavicular adenopathy, no mediastinal lymphadenopathy, with a new band of linear consolidation in the left upper lobe, felt to be post infectious consolidation. She has stable retroperitoneal and proximal iliac lymphadenopathy, and no new adenopathy in the abdomen and pelvis.  There is a normal spleen and no skeletal lesions. She has progression of her lymphadenopathy by exam and by CT scan.  We have started her on venetoclax  with a very slowly escalating dose of 20 mg daily for 1 week, 50 mg daily for 1 week, 100 mg daily for 1 week, and then 200 mg daily.  She is currently being seen weekly with labs including phosphorus and uric acid.  Baseline quantitative immunoglobulins and TSH were normal. She has been taking venetoclax  200 mg since November 10, 2023.   B12 deficiency anemia She was treated with B12 injections then transitioned to oral B12.  She is no longer taking B12 as her B12 level has been elevated  at the [facility] office.  On May 11, B12 was greater than 2000, iron studies and folate were normal.  She remains off B12 as her last level in June was over 1,000. It dropped to 350 in March, 2024 and so I had advised her to get back on oral B-12 and the level was back over 1,000 in June, 2024. We will continue to monitor this.   Pneumonia Left upper lobe, lobar pneumonia in May, 2024, has been treated with multiple antibiotics. CT scan showed resolution with a band of linear consolidation in the left upper lobe.   CVA She had another stroke in December, 2024 and is now on Plavix  75 mg. She has seen the neurologist and they are working on better control of her blood pressure and cholesterol.   Elevated Liver Transaminases  This has been present for 2 weeks now and slowly worsening. I would recommend we consider decreasing her Lipitor dose.    Plan: I advised her to discuss decreasing her Lipitor dose by half with her NP Harlene Bogaert due to persistent elevation of her liver transaminases which are slightly worse today. She notes not taking her Zofran  as often due to constipation. Overall I think she is tolerating treatment fairly well but I have switched her to compazine  10 mg Q6hrs prn. If she finds this too sedating we can lower the dose. She has a WBC of 1.9 with an ANC of 1400 up from 1.5 with an ANC of 1200, hemoglobin of 12.8 up from 11.7, and platelet count of 154,000. Her CMP is fairly normal other than a low sodium of 133 which is a  new finding, an elevated AST of 46, and ALT of 77. I will see her back in 1 week with CBC and CMP. The patient understands the plans discussed today and is in agreement with them.  She knows to contact our office if she develops concerns prior to her next appointment.  I provided 18 minutes of face-to-face time during this encounter and > 50% was spent counseling as documented under my assessment and plan.   Dawn VEAR Cornish, MD  [facility]  [facility] OF [facility] [address] [place] [address] Dept: [phone] Dept Fax: [phone]   No orders of the defined types were placed in this encounter.   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)  11/11/2015 Cancer Staging   Staging form: Hodgkin and Non-Hodgkin Lymphoma, AJCC 8th Edition - Clinical stage from 11/11/2015: Stage III (Small lymphocytic leukemia) - Signed by Prince Dawn VEAR, MD on 01/28/2021 Histopathologic type: Malignant lymphoma, small B lymphocytic, NOS (see also M-9823/3) Stage prefix: Initial diagnosis Diagnostic confirmation: Positive histology PLUS positive immunophenotyping and/or positive genetic studies Specimen type: Core Needle Biopsy Staged by: Managing physician Stage used in treatment planning: Yes National guidelines used in treatment planning: Yes Type of national guideline used in treatment planning: NCCN Staging comments: Watchful waiting   06/20/2020 Initial Diagnosis   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)   Chronic lymphocytic leukemia (CLL), B-cell (HCC)  10/31/2015 Initial Diagnosis   Chronic lymphocytic leukemia (CLL), B-cell (HCC)   11/11/2015 Cancer Staging   Staging form: Chronic Lymphocytic Leukemia / Small Lymphocytic Lymphoma, AJCC 8th Edition - Clinical stage from 11/11/2015: Modified Rai Stage III (Modified Rai risk: High, Binet: Stage B, Lugano: Stage III, Lymphocytosis: Absent, Adenopathy: Present, Organomegaly: Absent, Anemia: Absent, Thrombocytopenia: Absent) - Signed by Prince Dawn VEAR, MD on 04/05/2021 Histopathologic type: B-cell lymphocytic leukemia/small lymphocytic lymphoma (see also M-9670/3) Stage prefix: Initial diagnosis Stage used in treatment planning: Yes National  guidelines used in treatment planning: Yes Type of national guideline used in treatment planning: NCCN    INTERVAL HISTORY:  Luzelena is here today for repeat clinical assessment of her stage III small cell B-cell lymphoma/CLL. She was placed on venetoclax  oral therapy on June 16th, 2025. She has had some expected toxicities including fairly severe pancytopenias.  Patient states that she feels ok but complains of increased fatigue, left hip pain, diarrhea (1 episode), and nausea. She notes not taking her Zofran  as often due to constipation. I advised her to discuss decreasing her Lipitor dose by half with her NP Harlene Bogaert due to persistent elevation of her liver transaminases which are slightly worse today. Overall I think she is tolerating treatment fairly well but I have switched her to compazine  10 mg Q6hrs prn. If she finds this too sedating we can lower the dose. She has a WBC of 1.9 with an ANC of 1400 up from 1.5 with an ANC of 1200, hemoglobin of 12.8 up from 11.7, and platelet count of 154,000. Her CMP is fairly normal other than a low sodium of 133 which is a new finding, an elevated AST of 46, and ALT of 77. I will see her back in 1 week with CBC and CMP. She denies fever, chills, night sweats, or other signs of  infection. She denies cardiorespiratory issues. She  denies pain. Her appetite is poor but eating and Her weight has decreased 3 pounds over last week.This patient is accompanied in the office by her husband.    REVIEW OF SYSTEMS:   Review of Systems  Constitutional:  Positive for appetite change (decreased) and fatigue (increased). Negative for chills, diaphoresis, fever and unexpected weight change.  HENT:  Negative.  Negative for lump/mass, mouth sores, nosebleeds and sore throat.   Eyes: Negative.   Respiratory: Negative.  Negative for chest tightness, cough, hemoptysis, shortness of breath and wheezing.   Cardiovascular: Negative.  Negative for chest pain, leg swelling and  palpitations.  Gastrointestinal:  Positive for constipation, diarrhea (1 episode) and nausea. Negative for abdominal distention, abdominal pain, blood in stool and vomiting.  Endocrine: Negative.  Negative for hot flashes.  Genitourinary: Negative.  Negative for difficulty urinating, dysuria, frequency, hematuria and vaginal bleeding.   Musculoskeletal:  Positive for arthralgias (left hip). Negative for back pain, flank pain, gait problem, myalgias and neck pain.  Skin: Negative.  Negative for rash.  Neurological:  Negative for dizziness, extremity weakness, gait problem, headaches, light-headedness, numbness, seizures and speech difficulty.  Hematological: Negative.  Negative for adenopathy. Does not bruise/bleed easily.  Psychiatric/Behavioral: Negative.  Negative for depression and sleep disturbance. The patient is not nervous/anxious.      VITALS:   Blood pressure 110/73, pulse 72, temperature 98 F (36.7 C), temperature source Oral, resp. rate 16, height 5' 0.24 (1.53 m), weight 110 lb 6.4 oz (50.1 kg), SpO2 100%.  Wt Readings from Last 3 Encounters:  12/24/23 110 lb 8 oz (50.1 kg)  12/16/23 108 lb 6.4 oz (49.2 kg)  12/11/23 110 lb 6.4 oz (50.1 kg)    Body mass index is 21.39 kg/m.  Performance status (ECOG): 1 - Symptomatic but completely ambulatory  PHYSICAL EXAM:   Physical Exam Vitals and nursing note reviewed. Exam conducted with a chaperone present.  Constitutional:      General: She is not in acute distress.    Appearance: Normal appearance. She is normal weight. She is not ill-appearing, toxic-appearing or diaphoretic.  HENT:     Head: Normocephalic and atraumatic.     Right Ear: Tympanic membrane, ear canal and external ear normal. There is no impacted cerumen.     Left Ear: Tympanic membrane, ear canal and external ear normal. There is no impacted cerumen.     Nose: Nose normal. No congestion or rhinorrhea.     Mouth/Throat:     Mouth: Mucous membranes are moist.      Pharynx: Oropharynx is clear. No oropharyngeal exudate or posterior oropharyngeal erythema.  Eyes:     General: No scleral icterus.       Right eye: No discharge.        Left eye: No discharge.     Extraocular Movements: Extraocular movements intact.     Conjunctiva/sclera: Conjunctivae normal.     Pupils: Pupils are equal, round, and reactive to light.  Neck:     Vascular: No carotid bruit.  Cardiovascular:     Rate and Rhythm: Normal rate and regular rhythm.     Pulses: Normal pulses.     Heart sounds: Normal heart sounds. No murmur heard.    No friction rub. No gallop.  Pulmonary:     Effort: Pulmonary effort is normal. No respiratory distress.     Breath sounds: Normal breath sounds. No stridor. No wheezing, rhonchi or rales.  Chest:  Chest wall: No tenderness.  Abdominal:     General: Bowel sounds are normal. There is no distension.     Palpations: Abdomen is soft. There is no mass.     Tenderness: There is no abdominal tenderness. There is no right CVA tenderness, left CVA tenderness, guarding or rebound.     Hernia: No hernia is present.  Musculoskeletal:        General: No swelling, tenderness, deformity or signs of injury. Normal range of motion.     Cervical back: Normal range of motion and neck supple. No rigidity or tenderness.     Right lower leg: No edema.     Left lower leg: No edema.  Lymphadenopathy:     Cervical: No cervical adenopathy.     Upper Body:     Right upper body: No supraclavicular or axillary adenopathy.     Left upper body: Supraclavicular adenopathy present. No axillary adenopathy.     Lower Body: No right inguinal adenopathy. Left inguinal adenopathy present.     Comments: Her supraclavicular areas have mild firmness but I can no longer palpate adenopathy. She has 1 small left posterior node which is barely palpable and softer. Even her left inguinal adenopathy is nearly resolved.   Skin:    General: Skin is warm and dry.     Coloration:  Skin is not jaundiced or pale.     Findings: No bruising, erythema, lesion or rash.  Neurological:     General: No focal deficit present.     Mental Status: She is alert and oriented to person, place, and time. Mental status is at baseline.     Cranial Nerves: No cranial nerve deficit.     Sensory: No sensory deficit.     Motor: No weakness.     Coordination: Coordination normal.     Gait: Gait normal.     Deep Tendon Reflexes: Reflexes normal.  Psychiatric:        Mood and Affect: Mood normal.        Behavior: Behavior normal.        Thought Content: Thought content normal.        Judgment: Judgment normal.     LABS:      Latest Ref Rng & Units 12/24/2023   12:55 PM 12/16/2023    9:13 AM 12/11/2023   10:00 AM  CBC  WBC 4.0 - 10.5 K/uL 3.6  2.9  1.9   Hemoglobin 12.0 - 15.0 g/dL 86.5  86.9  87.1   Hematocrit 36.0 - 46.0 % 40.4  39.0  38.0   Platelets 150 - 400 K/uL 175  152  154       Latest Ref Rng & Units 12/24/2023   12:55 PM 12/16/2023    9:13 AM 12/11/2023   10:00 AM  CMP  Glucose 70 - 99 mg/dL 91  99  889   BUN 8 - 23 mg/dL 11  14  10    Creatinine 0.44 - 1.00 mg/dL 9.23  9.24  9.23   Sodium 135 - 145 mmol/L 137  135  133   Potassium 3.5 - 5.1 mmol/L 3.8  4.2  4.3   Chloride 98 - 111 mmol/L 99  100  98   CO2 22 - 32 mmol/L 27  25  24    Calcium  8.9 - 10.3 mg/dL 9.6  9.6  9.1   Total Protein 6.5 - 8.1 g/dL 7.2  7.2  7.1   Total Bilirubin 0.0 - 1.2 mg/dL 0.3  0.5  0.4   Alkaline Phos 38 - 126 U/L 90  76  78   AST 15 - 41 U/L 41  33  46   ALT 0 - 44 U/L 67  49  77    Lab Results  Component Value Date   TIBC 322 12/12/2022   TIBC 331 07/23/2021   FERRITIN 19 12/12/2022   FERRITIN 22 07/23/2021   IRONPCTSAT 30 12/12/2022   IRONPCTSAT 20 07/23/2021   Lab Results  Component Value Date   LDH 163 10/15/2023   LDH 162 07/01/2023   LDH 136 10/30/2021    STUDIES:  EXAM: 10/02/2023 CT CHEST, ABDOMEN, AND PELVIS WITH CONTRAST  IMPRESSION: 1. Increased size of  retroperitoneal, iliac side chain, pelvic sidewall and inguinal lymph nodes with new prominent thoracic inlet/upper mediastinal lymph nodes, compatible with worsening lymphoma. 2. Overall stable prominent bilateral supraclavicular and axillary adenopathy. 3. No splenomegaly. 4. Colonic stool burden compatible with constipation.  EXAM: 09/19/2023 MRI LUMBAR SPINE WITHOUT AND WITH CONTRAST IMPRESSION: 1. No significant change since 2021. 2. Scoliotic curvature convex to the left with the apex at L2-3. 3. L4-5: Shallow disc protrusion more prominent in the left posterolateral direction. Facet and ligamentous hypertrophy. Moderate stenosis at this level, more severe on the left. Potential for neural compression in the left lateral recess. 4. L5-S1: Disc degeneration more pronounced on the left with loss of disc height. Endplate osteophytes and bulging of the disc more towards the left. Facet hypertrophy on the left. Stenosis of the foramen and lateral recess on the left that could cause left-sided neural compression.  HISTORY:   Past Medical History:  Diagnosis Date   Allergy    seasonal   Arthritis    Bipolar 1 disorder (HCC)    Cancer (HCC)    non hodgkins lymphoma   Depression    GERD (gastroesophageal reflux disease)    History of degenerative disc disease    Hyperlipidemia    Increased risk of breast cancer 04/25/2021   Obstructive sleep apnea    Osteopenia    Scoliosis    Thyroid  disease    hypothyroidism   Urticaria    Vitamin D deficiency     Past Surgical History:  Procedure Laterality Date   BREAST BIOPSY     BUBBLE STUDY  02/13/2021   Procedure: BUBBLE STUDY;  Surgeon: Raford Riggs, MD;  Location: [facility] ENDOSCOPY;  Service: Cardiovascular;;   CARPAL TUNNEL RELEASE Bilateral 2002   CESAREAN SECTION     x2   COLONOSCOPY  07/26/2008   Melanosis coli. Small internal hemorrhoids.    ENDOSCOPIC PLANTAR FASCIOTOMY     ESOPHAGOGASTRODUODENOSCOPY  03/17/2013    Mild gastritis. Status post esophageal dilatation.   LYMPH NODE BIOPSY     right hip repair torn tendon Right 09/2021   TEE WITHOUT CARDIOVERSION N/A 02/13/2021   Procedure: TRANSESOPHAGEAL ECHOCARDIOGRAM (TEE);  Surgeon: Raford Riggs, MD;  Location: [facility] ENDOSCOPY;  Service: Cardiovascular;  Laterality: N/A;   WISDOM TOOTH EXTRACTION      Family History  Problem Relation Age of Onset   Prostate cancer Father 12   Melanoma Father 50   High blood pressure Father    Breast cancer Maternal Aunt    Multiple myeloma Maternal Aunt    Breast cancer Maternal Aunt    Colon cancer Neg Hx    Colon polyps Neg Hx    Esophageal cancer Neg Hx    Rectal cancer Neg Hx    Stomach cancer Neg Hx  Social History:  reports that she has never smoked. She has never used smokeless tobacco. She reports that she does not currently use alcohol. She reports that she does not use drugs.The patient is accompanied by her husband today.  Allergies:  Allergies  Allergen Reactions   Diclofenac Sodium Other (See Comments)    Thought it had something to do with her having a stroke   Voltaren [Diclofenac Sodium]     Thought it had something to do with her having a stroke   Nsaids Other (See Comments)    Had a stroke and worries it was related to the Diclofenac she took.    Current Medications: Current Outpatient Medications  Medication Sig Dispense Refill   Ascorbic Acid (VITAMIN C) 1000 MG tablet Take 1,000 mg by mouth 2 (two) times daily.     atorvastatin  (LIPITOR) 80 MG tablet Take 1 tablet (80 mg total) by mouth daily. 30 tablet 5   cetirizine (ZYRTEC) 10 MG tablet Take 10 mg by mouth 2 (two) times daily.     estradiol (ESTRACE) 0.1 MG/GM vaginal cream 2 (two) times a week.     famotidine  (PEPCID ) 20 MG tablet Take 1 tablet (20 mg total) by mouth 2 (two) times daily. 60 tablet 2   FLUoxetine  HCl (PROZAC  PO) Take 60 mg by mouth daily.     Multiple Vitamin (MULTIVITAMIN ADULT PO) Take by mouth. Vision  MD once a day     ondansetron  (ZOFRAN -ODT) 4 MG disintegrating tablet Take 1 tablet (4 mg total) by mouth every 8 (eight) hours as needed for nausea or vomiting. 20 tablet 2   PLAVIX  75 MG tablet Take 75 mg by mouth daily.     Probiotic Product (PROBIOTIC BLEND PO) Take by mouth at bedtime.     prochlorperazine  (COMPAZINE ) 10 MG tablet Take 1 tablet (10 mg total) by mouth every 6 (six) hours as needed for nausea or vomiting. 30 tablet 5   ramipril  (ALTACE ) 2.5 MG capsule Take by mouth. 1 in am in 1 in pm     venetoclax  (VENCLEXTA ) 100 MG tablet Take 2 tablets (200 mg total) by mouth daily. Tablets should be swallowed whole with a meal and a full glass of water. 60 tablet 5   No current facility-administered medications for this visit.    I,[name],acting as a scribe for Dawn VEAR Cornish, MD.,have documented all relevant documentation on the behalf of Dawn VEAR Cornish, MD,as directed by  Dawn VEAR Cornish, MD while in the presence of Dawn VEAR Cornish, MD.

## 2023-12-08 DIAGNOSIS — Z6821 Body mass index (BMI) 21.0-21.9, adult: Secondary | ICD-10-CM | POA: Diagnosis not present

## 2023-12-10 ENCOUNTER — Telehealth: Payer: Self-pay

## 2023-12-10 NOTE — Telephone Encounter (Signed)
 Oncology Pharmacist Encounter   Message sent to MD below for patients visit on 12/11/23.   I received an email from this patient regarding a request about a supplement. The supplement is Sciatiease vitamin - I went through the ingredients and there is nothing significant with the serving with vitamins and the other medications she is on.   The supplement I found based on the information was a Sciatiease sciatic nerve formula.   To note, there is an herbal blend in it with ginger root - ginger root has anticoagulant/ antiplatelet effects. This ginger root may increase antiplatelet effects with the fluoxetine  and plavix . Recommended to closely monitor patient if the agents are co-administered.   I was not able to find any information on chinese skullcap (root) extract or benfotiamine.   I emailed the patient back to notify them that I have sent you this message for you all to discuss pros and cons at tomorrows visit.   Lowell Mcgurk, PharmD Hematology/Oncology Clinical Pharmacist [facility] [phone]

## 2023-12-11 ENCOUNTER — Inpatient Hospital Stay

## 2023-12-11 ENCOUNTER — Inpatient Hospital Stay: Admitting: Oncology

## 2023-12-11 ENCOUNTER — Telehealth: Payer: Self-pay

## 2023-12-11 ENCOUNTER — Other Ambulatory Visit: Payer: Self-pay | Admitting: Oncology

## 2023-12-11 ENCOUNTER — Encounter: Payer: Self-pay | Admitting: Oncology

## 2023-12-11 VITALS — BP 110/73 | HR 72 | Temp 98.0°F | Resp 16 | Ht 60.24 in | Wt 110.4 lb

## 2023-12-11 DIAGNOSIS — C9110 Chronic lymphocytic leukemia of B-cell type not having achieved remission: Secondary | ICD-10-CM

## 2023-12-11 DIAGNOSIS — Z79899 Other long term (current) drug therapy: Secondary | ICD-10-CM | POA: Diagnosis not present

## 2023-12-11 DIAGNOSIS — C8302 Small cell B-cell lymphoma, intrathoracic lymph nodes: Secondary | ICD-10-CM

## 2023-12-11 DIAGNOSIS — M8580 Other specified disorders of bone density and structure, unspecified site: Secondary | ICD-10-CM | POA: Diagnosis not present

## 2023-12-11 DIAGNOSIS — Z7902 Long term (current) use of antithrombotics/antiplatelets: Secondary | ICD-10-CM | POA: Diagnosis not present

## 2023-12-11 DIAGNOSIS — Z7969 Long term (current) use of other immunomodulators and immunosuppressants: Secondary | ICD-10-CM | POA: Diagnosis not present

## 2023-12-11 LAB — CBC WITH DIFFERENTIAL (CANCER CENTER ONLY)
Abs Immature Granulocytes: 0.01 K/uL (ref 0.00–0.07)
Basophils Absolute: 0 K/uL (ref 0.0–0.1)
Basophils Relative: 0 %
Eosinophils Absolute: 0 K/uL (ref 0.0–0.5)
Eosinophils Relative: 0 %
HCT: 38 % (ref 36.0–46.0)
Hemoglobin: 12.8 g/dL (ref 12.0–15.0)
Immature Granulocytes: 1 %
Lymphocytes Relative: 8 %
Lymphs Abs: 0.1 K/uL — ABNORMAL LOW (ref 0.7–4.0)
MCH: 35.2 pg — ABNORMAL HIGH (ref 26.0–34.0)
MCHC: 33.7 g/dL (ref 30.0–36.0)
MCV: 104.4 fL — ABNORMAL HIGH (ref 80.0–100.0)
Monocytes Absolute: 0.3 K/uL (ref 0.1–1.0)
Monocytes Relative: 14 %
Neutro Abs: 1.4 K/uL — ABNORMAL LOW (ref 1.7–7.7)
Neutrophils Relative %: 77 %
Platelet Count: 154 K/uL (ref 150–400)
RBC: 3.64 MIL/uL — ABNORMAL LOW (ref 3.87–5.11)
RDW: 13.1 % (ref 11.5–15.5)
WBC Count: 1.9 K/uL — ABNORMAL LOW (ref 4.0–10.5)
nRBC: 0 % (ref 0.0–0.2)

## 2023-12-11 LAB — CMP (CANCER CENTER ONLY)
ALT: 77 U/L — ABNORMAL HIGH (ref 0–44)
AST: 46 U/L — ABNORMAL HIGH (ref 15–41)
Albumin: 4.1 g/dL (ref 3.5–5.0)
Alkaline Phosphatase: 78 U/L (ref 38–126)
Anion gap: 11 (ref 5–15)
BUN: 10 mg/dL (ref 8–23)
CO2: 24 mmol/L (ref 22–32)
Calcium: 9.1 mg/dL (ref 8.9–10.3)
Chloride: 98 mmol/L (ref 98–111)
Creatinine: 0.76 mg/dL (ref 0.44–1.00)
GFR, Estimated: >60 mL/min (ref >60)
Glucose, Bld: 110 mg/dL — ABNORMAL HIGH (ref 70–99)
Potassium: 4.3 mmol/L (ref 3.5–5.1)
Sodium: 133 mmol/L — ABNORMAL LOW (ref 135–145)
Total Bilirubin: 0.4 mg/dL (ref 0.0–1.2)
Total Protein: 7.1 g/dL (ref 6.5–8.1)

## 2023-12-11 LAB — PHOSPHORUS: Phosphorus: 3.7 mg/dL (ref 2.5–4.6)

## 2023-12-11 LAB — URIC ACID: Uric Acid, Serum: 2.8 mg/dL (ref 2.5–7.1)

## 2023-12-11 MED ORDER — PROCHLORPERAZINE MALEATE 10 MG PO TABS: 10.0000 mg | ORAL_TABLET | Freq: Four times a day (QID) | ORAL | 5 refills | Status: DC | PRN

## 2023-12-11 NOTE — Telephone Encounter (Signed)
 Pt in to see Dr Cornelius today. She continues to take the Venclexta  between 8a-830a daily. No missed doses. She is noticing more fatigue. She finds herself lying on couch more often. Her hot flashes are worse. She is down 3#, she has to make herself eat. No desire to eat, intermittent nausea, no emesis- tolerating bland diet. Drinking plenty of fluids however. She denies fever, mouth sores, and constipation. Had 1 episode of diarrhea yesterday. Her rash has completely left for the last 2 weeks, without Pepcid , Zofran , or Claritin  medications used.

## 2023-12-16 ENCOUNTER — Inpatient Hospital Stay: Admitting: Oncology

## 2023-12-16 ENCOUNTER — Inpatient Hospital Stay

## 2023-12-16 ENCOUNTER — Encounter: Payer: Self-pay | Admitting: Oncology

## 2023-12-16 VITALS — BP 142/87 | HR 69 | Temp 97.8°F | Resp 16 | Ht 60.24 in | Wt 108.4 lb

## 2023-12-16 DIAGNOSIS — C9110 Chronic lymphocytic leukemia of B-cell type not having achieved remission: Secondary | ICD-10-CM

## 2023-12-16 DIAGNOSIS — Z7969 Long term (current) use of other immunomodulators and immunosuppressants: Secondary | ICD-10-CM | POA: Diagnosis not present

## 2023-12-16 DIAGNOSIS — C8302 Small cell B-cell lymphoma, intrathoracic lymph nodes: Secondary | ICD-10-CM

## 2023-12-16 DIAGNOSIS — M8580 Other specified disorders of bone density and structure, unspecified site: Secondary | ICD-10-CM | POA: Diagnosis not present

## 2023-12-16 DIAGNOSIS — Z79899 Other long term (current) drug therapy: Secondary | ICD-10-CM | POA: Diagnosis not present

## 2023-12-16 DIAGNOSIS — Z7902 Long term (current) use of antithrombotics/antiplatelets: Secondary | ICD-10-CM | POA: Diagnosis not present

## 2023-12-16 LAB — CBC WITH DIFFERENTIAL (CANCER CENTER ONLY)
Abs Immature Granulocytes: 0.01 K/uL (ref 0.00–0.07)
Basophils Absolute: 0 K/uL (ref 0.0–0.1)
Basophils Relative: 0 %
Eosinophils Absolute: 0 K/uL (ref 0.0–0.5)
Eosinophils Relative: 0 %
HCT: 39 % (ref 36.0–46.0)
Hemoglobin: 13 g/dL (ref 12.0–15.0)
Immature Granulocytes: 0 %
Lymphocytes Relative: 4 %
Lymphs Abs: 0.1 K/uL — ABNORMAL LOW (ref 0.7–4.0)
MCH: 34.7 pg — ABNORMAL HIGH (ref 26.0–34.0)
MCHC: 33.3 g/dL (ref 30.0–36.0)
MCV: 104 fL — ABNORMAL HIGH (ref 80.0–100.0)
Monocytes Absolute: 0.3 K/uL (ref 0.1–1.0)
Monocytes Relative: 11 %
Neutro Abs: 2.4 K/uL (ref 1.7–7.7)
Neutrophils Relative %: 85 %
Platelet Count: 152 K/uL (ref 150–400)
RBC: 3.75 MIL/uL — ABNORMAL LOW (ref 3.87–5.11)
RDW: 12.6 % (ref 11.5–15.5)
WBC Count: 2.9 K/uL — ABNORMAL LOW (ref 4.0–10.5)
nRBC: 0 % (ref 0.0–0.2)

## 2023-12-16 LAB — CMP (CANCER CENTER ONLY)
ALT: 49 U/L — ABNORMAL HIGH (ref 0–44)
AST: 33 U/L (ref 15–41)
Albumin: 4.1 g/dL (ref 3.5–5.0)
Alkaline Phosphatase: 76 U/L (ref 38–126)
Anion gap: 11 (ref 5–15)
BUN: 14 mg/dL (ref 8–23)
CO2: 25 mmol/L (ref 22–32)
Calcium: 9.6 mg/dL (ref 8.9–10.3)
Chloride: 100 mmol/L (ref 98–111)
Creatinine: 0.75 mg/dL (ref 0.44–1.00)
GFR, Estimated: >60 mL/min (ref >60)
Glucose, Bld: 99 mg/dL (ref 70–99)
Potassium: 4.2 mmol/L (ref 3.5–5.1)
Sodium: 135 mmol/L (ref 135–145)
Total Bilirubin: 0.5 mg/dL (ref 0.0–1.2)
Total Protein: 7.2 g/dL (ref 6.5–8.1)

## 2023-12-16 LAB — PHOSPHORUS: Phosphorus: 3.6 mg/dL (ref 2.5–4.6)

## 2023-12-16 LAB — URIC ACID: Uric Acid, Serum: 2.9 mg/dL (ref 2.5–7.1)

## 2023-12-16 NOTE — Progress Notes (Signed)
 [facility]  [address] 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  She had been on observation only for 8 years. She has had stable bilateral cervical and inguinal lymphadenopathy.  MRI imaging from February 2023 revealed extensive inguinal and retroperitoneal lymphadenopathy.  Her physical exam revealed a modest change in her cervical and supraclavicular adenopathy. PET scan in March 2023 revealed continued stability of mild adenopathy in the neck, chest, abdomen and pelvis with low level FDG and Deauville 2-3 category uptake. CT imaging in September 2023 shows the numerous mildly enlarged nodes to be mildly increased. She had progression of her lymphadenopathy by exam and by CT scan in May, 2025.  We have started her on venetoclax  with a very slowly escalating dose of 20 mg daily for 1 week, 50 mg daily for 1 week, 100 mg daily for 1 week, and now 200 mg daily since November 10, 2023. She has experienced significant cytopenias but these are slowly improving. She still has nausea, vomiting and diarrhea but is clearly responding with her adenopathy nearly resolved on exam.    B12 deficiency anemia She was treated with B12 injections then transitioned to oral B12.  She is no longer taking B12 as her B12 level has been elevated at the [facility] office.  On May 11, B12 was greater than 2000, iron studies and folate were normal.  She remains off B12 as her last level in June was over 1,000. It dropped to 350 in March, 2024 and so I had advised her to get back on oral B-12 and the level was back over 1,000 in June, 2024. We will continue to monitor this.   Pneumonia Left upper lobe, lobar pneumonia in May, 2024, has been treated with multiple antibiotics and is clinically  improving. CT scan shows resolution with a band of linear consolidation in the left upper lobe.   CVA She had another stroke in December, 2024 and is now on Plavix  75 mg. She has seen the neurologist and they are working on better control of her blood pressure and cholesterol.    Elevated Liver Transaminases  This has been present for 2 weeks now and was slowly worsening but now is improving. I recommended decreasing her Lipitor dose to 40mg  daily.    Plan: She continues venetoclax  100 mg BID and complains of nausea, vomiting, diarrhea. I recommended she try imodium to see if this alleviates her symptoms. She has had cytopenias which are slowly improving. She has a low WBC of 2.9 with an ANC of 2400 improved from 1.9 with an ANC of 1400, hemoglobin of 13.0, and platelet count of 152,000. Her CMP is normal other than a elevated ALT of 49 improved from 77, and her AST is normal. Her phosphorus and uric acid level today are pending. We will plan to repeat scans in the fall. She will return on the 30th with CBC, CMP, phosphorus, and uric acid. We will then go to 2 week follow-up. I will see her in 3 weeks with CBC, CMP, phosphorus, and uric acid. The patient understands the plans discussed today and is in agreement with them.  She knows to contact our office if she develops concerns prior to her next appointment.  I provided 16 minutes of face-to-face time during this encounter and >  50% was spent counseling as documented under my assessment and plan.   Dawn VEAR Cornish, MD  [facility] - A DEPT OF [facility] [address] [place] [address] Dept: [phone] Dept Fax: [phone]   No orders of the defined types were placed in this encounter.   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  Current Treatment: Venetoclax  200 mg daily  HISTORY OF PRESENT ILLNESS:   Oncology History  Small cell B-cell lymphoma of  intrathoracic lymph nodes (HCC)  11/11/2015 Cancer Staging   Staging form: Hodgkin and Non-Hodgkin Lymphoma, AJCC 8th Edition - Clinical stage from 11/11/2015: Stage III (Small lymphocytic leukemia) - Signed by Prince Dawn VEAR, MD on 01/28/2021 Histopathologic type: Malignant lymphoma, small B lymphocytic, NOS (see also M-9823/3) Stage prefix: Initial diagnosis Diagnostic confirmation: Positive histology PLUS positive immunophenotyping and/or positive genetic studies Specimen type: Core Needle Biopsy Staged by: Managing physician Stage used in treatment planning: Yes National guidelines used in treatment planning: Yes Type of national guideline used in treatment planning: NCCN Staging comments: Watchful waiting   06/20/2020 Initial Diagnosis   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)   Chronic lymphocytic leukemia (CLL), B-cell (HCC)  10/31/2015 Initial Diagnosis   Chronic lymphocytic leukemia (CLL), B-cell (HCC)   11/11/2015 Cancer Staging   Staging form: Chronic Lymphocytic Leukemia / Small Lymphocytic Lymphoma, AJCC 8th Edition - Clinical stage from 11/11/2015: Modified Rai Stage III (Modified Rai risk: High, Binet: Stage B, Lugano: Stage III, Lymphocytosis: Absent, Adenopathy: Present, Organomegaly: Absent, Anemia: Absent, Thrombocytopenia: Absent) - Signed by Prince Dawn VEAR, MD on 04/05/2021 Histopathologic type: B-cell lymphocytic leukemia/small lymphocytic lymphoma (see also M-9670/3) Stage prefix: Initial diagnosis Stage used in treatment planning: Yes National guidelines used in treatment planning: Yes Type of national guideline used in treatment planning: NCCN     INTERVAL HISTORY:  Dawn Prince is here today for repeat clinical assessment stage III small cell B-cell lymphoma. She was placed on venetoclax  oral chemotherapy on June 16th, 2025. Patient states that she feels well and has no complaints of pain. She continues venetoclax  100 mg BID and complains  of nausea, vomiting, diarrhea. I recommended she try imodium to see if this alleviates her symptoms. She is hesitant to do so due to possible constipation.  She has had cytopenias which are slowly improving. She has a low WBC of 2.9 with an ANC of 2400 improved from 1.9 with an ANC of 1400, hemoglobin of 13.0, and platelet count of 152,000. Her CMP is normal other than a elevated ALT of 49 improved from 77, and her AST is normal. Her phosphorus and uric acid level today are pending. We will plan to repeat scans in the fall. She will return on the 30th with CBC, CMP, phosphorus, and uric acid. We will then go to 2 week follow-up. I will see her in 3 weeks with CBC, CMP, phosphorus, and uric acid. She denies fever, chills, night sweats, or other signs of infection. She denies cardiorespiratory issues. She  denies pain. Her appetite has decreased by half and Her weight has decreased 2 pounds over last week. This patient is accompanied in the office by her husband.   REVIEW OF SYSTEMS:  Review of Systems  Constitutional:  Positive for appetite change (poor). Negative for chills, diaphoresis, fatigue, fever and unexpected weight change.  HENT:  Negative.  Negative for lump/mass, mouth sores, nosebleeds and sore throat.   Eyes: Negative.  Respiratory: Negative.  Negative for chest tightness, cough, hemoptysis, shortness of breath and wheezing.   Cardiovascular: Negative.  Negative for chest pain, leg swelling and palpitations.  Gastrointestinal:  Positive for diarrhea, nausea and vomiting. Negative for abdominal distention, abdominal pain, blood in stool and constipation.  Endocrine: Negative.  Negative for hot flashes.  Genitourinary: Negative.  Negative for difficulty urinating, dysuria, frequency, hematuria and vaginal bleeding.   Musculoskeletal:  Positive for arthralgias (left hip). Negative for back pain, flank pain, gait problem, myalgias and neck pain.  Skin: Negative.  Negative for rash.   Neurological:  Positive for headaches (intermittent). Negative for dizziness, extremity weakness, gait problem, light-headedness, numbness, seizures and speech difficulty.  Hematological: Negative.  Negative for adenopathy. Does not bruise/bleed easily.  Psychiatric/Behavioral: Negative.  Negative for depression and sleep disturbance. The patient is not nervous/anxious.      VITALS:  Blood pressure (!) 142/87, pulse 69, temperature 97.8 F (36.6 C), temperature source Oral, resp. rate 16, height 5' 0.24 (1.53 m), weight 108 lb 6.4 oz (49.2 kg), SpO2 100%.  Wt Readings from Last 3 Encounters:  12/24/23 110 lb 8 oz (50.1 kg)  12/16/23 108 lb 6.4 oz (49.2 kg)  12/11/23 110 lb 6.4 oz (50.1 kg)    Body mass index is 21 kg/m.  Performance status (ECOG): 1 - Symptomatic but completely ambulatory  PHYSICAL EXAM:   Physical Exam Vitals and nursing note reviewed. Exam conducted with a chaperone present.  Constitutional:      General: She is not in acute distress.    Appearance: Normal appearance. She is normal weight. She is not ill-appearing, toxic-appearing or diaphoretic.  HENT:     Head: Normocephalic and atraumatic.     Right Ear: Tympanic membrane, ear canal and external ear normal. There is no impacted cerumen.     Left Ear: Tympanic membrane, ear canal and external ear normal. There is no impacted cerumen.     Nose: Nose normal. No congestion or rhinorrhea.     Mouth/Throat:     Mouth: Mucous membranes are moist.     Pharynx: Oropharynx is clear. No oropharyngeal exudate or posterior oropharyngeal erythema.  Eyes:     General: No scleral icterus.       Right eye: No discharge.        Left eye: No discharge.     Extraocular Movements: Extraocular movements intact.     Conjunctiva/sclera: Conjunctivae normal.     Pupils: Pupils are equal, round, and reactive to light.  Neck:     Vascular: No carotid bruit.  Cardiovascular:     Rate and Rhythm: Normal rate and regular rhythm.      Pulses: Normal pulses.     Heart sounds: Normal heart sounds. No murmur heard.    No friction rub. No gallop.  Pulmonary:     Effort: Pulmonary effort is normal. No respiratory distress.     Breath sounds: Normal breath sounds. No stridor. No wheezing, rhonchi or rales.  Chest:     Chest wall: No tenderness.  Abdominal:     General: Bowel sounds are normal. There is no distension.     Palpations: Abdomen is soft. There is no hepatomegaly, splenomegaly or mass.     Tenderness: There is no abdominal tenderness. There is no right CVA tenderness, left CVA tenderness, guarding or rebound.     Hernia: No hernia is present.  Musculoskeletal:        General: Normal range of motion.  Cervical back: Normal range of motion and neck supple. No rigidity or tenderness.     Right lower leg: No edema.     Left lower leg: No edema.  Lymphadenopathy:     Cervical: No cervical adenopathy.     Right cervical: No superficial, deep or posterior cervical adenopathy.    Left cervical: No superficial, deep or posterior cervical adenopathy.     Upper Body:     Right upper body: No supraclavicular, axillary or pectoral adenopathy.     Left upper body: Supraclavicular adenopathy and axillary adenopathy present. No pectoral adenopathy.     Lower Body: No right inguinal adenopathy. Left inguinal adenopathy present.     Comments: Barely feel a small soft node in the left cervical area Tiny nodes in the left supraclavicular area 1cm node in the anterior left axilla Firmness in the right axilla  Skin:    General: Skin is warm and dry.     Coloration: Skin is not jaundiced.     Findings: No rash.  Neurological:     General: No focal deficit present.     Mental Status: She is alert and oriented to person, place, and time. Mental status is at baseline.     Cranial Nerves: No cranial nerve deficit.  Psychiatric:        Mood and Affect: Mood normal.        Behavior: Behavior normal.        Thought  Content: Thought content normal.        Judgment: Judgment normal.     LABS:      Latest Ref Rng & Units 12/24/2023   12:55 PM 12/16/2023    9:13 AM 12/11/2023   10:00 AM  CBC  WBC 4.0 - 10.5 K/uL 3.6  2.9  1.9   Hemoglobin 12.0 - 15.0 g/dL 86.5  86.9  87.1   Hematocrit 36.0 - 46.0 % 40.4  39.0  38.0   Platelets 150 - 400 K/uL 175  152  154       Latest Ref Rng & Units 12/24/2023   12:55 PM 12/16/2023    9:13 AM 12/11/2023   10:00 AM  CMP  Glucose 70 - 99 mg/dL 91  99  889   BUN 8 - 23 mg/dL 11  14  10    Creatinine 0.44 - 1.00 mg/dL 9.23  9.24  9.23   Sodium 135 - 145 mmol/L 137  135  133   Potassium 3.5 - 5.1 mmol/L 3.8  4.2  4.3   Chloride 98 - 111 mmol/L 99  100  98   CO2 22 - 32 mmol/L 27  25  24    Calcium  8.9 - 10.3 mg/dL 9.6  9.6  9.1   Total Protein 6.5 - 8.1 g/dL 7.2  7.2  7.1   Total Bilirubin 0.0 - 1.2 mg/dL 0.3  0.5  0.4   Alkaline Phos 38 - 126 U/L 90  76  78   AST 15 - 41 U/L 41  33  46   ALT 0 - 44 U/L 67  49  77    Lab Results  Component Value Date   TIBC 322 12/12/2022   TIBC 331 07/23/2021   FERRITIN 19 12/12/2022   FERRITIN 22 07/23/2021   IRONPCTSAT 30 12/12/2022   IRONPCTSAT 20 07/23/2021   Lab Results  Component Value Date   LDH 163 10/15/2023   LDH 162 07/01/2023   LDH 136 10/30/2021    STUDIES:  EXAM: 10/02/2023 CT CHEST, ABDOMEN, AND PELVIS WITH CONTRAST  IMPRESSION: 1. Increased size of retroperitoneal, iliac side chain, pelvic sidewall and inguinal lymph nodes with new prominent thoracic inlet/upper mediastinal lymph nodes, compatible with worsening lymphoma. 2. Overall stable prominent bilateral supraclavicular and axillary adenopathy. 3. No splenomegaly. 4. Colonic stool burden compatible with constipation.   EXAM: 09/19/2023 MRI LUMBAR SPINE WITHOUT AND WITH CONTRAST IMPRESSION: 1. No significant change since 2021. 2. Scoliotic curvature convex to the left with the apex at L2-3. 3. L4-5: Shallow disc protrusion more  prominent in the left posterolateral direction. Facet and ligamentous hypertrophy. Moderate stenosis at this level, more severe on the left. Potential for neural compression in the left lateral recess. 4. L5-S1: Disc degeneration more pronounced on the left with loss of disc height. Endplate osteophytes and bulging of the disc more towards the left. Facet hypertrophy on the left. Stenosis of the foramen and lateral recess on the left that could cause left-sided neural compression.  HISTORY:   Past Medical History:  Diagnosis Date   Allergy    seasonal   Arthritis    Bipolar 1 disorder (HCC)    Cancer (HCC)    non hodgkins lymphoma   Depression    GERD (gastroesophageal reflux disease)    History of degenerative disc disease    Hyperlipidemia    Increased risk of breast cancer 04/25/2021   Obstructive sleep apnea    Osteopenia    Scoliosis    Thyroid  disease    hypothyroidism   Urticaria    Vitamin D deficiency     Past Surgical History:  Procedure Laterality Date   BREAST BIOPSY     BUBBLE STUDY  02/13/2021   Procedure: BUBBLE STUDY;  Surgeon: Raford Riggs, MD;  Location: [facility] ENDOSCOPY;  Service: Cardiovascular;;   CARPAL TUNNEL RELEASE Bilateral 2002   CESAREAN SECTION     x2   COLONOSCOPY  07/26/2008   Melanosis coli. Small internal hemorrhoids.    ENDOSCOPIC PLANTAR FASCIOTOMY     ESOPHAGOGASTRODUODENOSCOPY  03/17/2013   Mild gastritis. Status post esophageal dilatation.   LYMPH NODE BIOPSY     right hip repair torn tendon Right 09/2021   TEE WITHOUT CARDIOVERSION N/A 02/13/2021   Procedure: TRANSESOPHAGEAL ECHOCARDIOGRAM (TEE);  Surgeon: Raford Riggs, MD;  Location[facility] ENDOSCOPY;  Service: Cardiovascular;  Laterality: N/A;   WISDOM TOOTH EXTRACTION      Family History  Problem Relation Age of Onset   Prostate cancer Father 12   Melanoma Father 79   High blood pressure Father    Breast cancer Maternal Aunt    Multiple myeloma Maternal Aunt    Breast  cancer Maternal Aunt    Colon cancer Neg Hx    Colon polyps Neg Hx    Esophageal cancer Neg Hx    Rectal cancer Neg Hx    Stomach cancer Neg Hx     Social History:  reports that she has never smoked. She has never used smokeless tobacco. She reports that she does not currently use alcohol. She reports that she does not use drugs.The patient is accompanied by her husband today.  Allergies:  Allergies  Allergen Reactions   Diclofenac Sodium Other (See Comments)    Thought it had something to do with her having a stroke   Voltaren [Diclofenac Sodium]     Thought it had something to do with her having a stroke   Nsaids Other (See Comments)    Had a stroke and worries it was  related to the Diclofenac she took.    Current Medications: Current Outpatient Medications  Medication Sig Dispense Refill   Ascorbic Acid (VITAMIN C) 1000 MG tablet Take 1,000 mg by mouth 2 (two) times daily.     atorvastatin  (LIPITOR) 80 MG tablet Take 1 tablet (80 mg total) by mouth daily. 30 tablet 5   cetirizine (ZYRTEC) 10 MG tablet Take 10 mg by mouth 2 (two) times daily.     estradiol (ESTRACE) 0.1 MG/GM vaginal cream 2 (two) times a week.     famotidine  (PEPCID ) 20 MG tablet Take 1 tablet (20 mg total) by mouth 2 (two) times daily. 60 tablet 2   FLUoxetine  HCl (PROZAC  PO) Take 60 mg by mouth daily.     Multiple Vitamin (MULTIVITAMIN ADULT PO) Take by mouth. Vision MD once a day     ondansetron  (ZOFRAN -ODT) 4 MG disintegrating tablet Take 1 tablet (4 mg total) by mouth every 8 (eight) hours as needed for nausea or vomiting. 20 tablet 2   PLAVIX  75 MG tablet Take 75 mg by mouth daily.     Probiotic Product (PROBIOTIC BLEND PO) Take by mouth at bedtime.     prochlorperazine  (COMPAZINE ) 10 MG tablet Take 1 tablet (10 mg total) by mouth every 6 (six) hours as needed for nausea or vomiting. 30 tablet 5   ramipril  (ALTACE ) 2.5 MG capsule Take by mouth. 1 in am in 1 in pm     venetoclax  (VENCLEXTA ) 100 MG tablet  Take 2 tablets (200 mg total) by mouth daily. Tablets should be swallowed whole with a meal and a full glass of water. 60 tablet 5   No current facility-administered medications for this visit.    I,[name],acting as a scribe for Dawn VEAR Cornish, MD.,have documented all relevant documentation on the behalf of Dawn VEAR Cornish, MD,as directed by  Dawn VEAR Cornish, MD while in the presence of Dawn VEAR Cornish, MD.

## 2023-12-18 ENCOUNTER — Ambulatory Visit

## 2023-12-23 ENCOUNTER — Telehealth: Payer: Self-pay | Admitting: Dietician

## 2023-12-23 ENCOUNTER — Other Ambulatory Visit

## 2023-12-23 ENCOUNTER — Ambulatory Visit: Admitting: Hematology and Oncology

## 2023-12-23 ENCOUNTER — Inpatient Hospital Stay: Admitting: Dietician

## 2023-12-23 NOTE — Progress Notes (Signed)
 NUTRITION FOLLOW UP:  Patient returned message left earlier in day.  She relayed that she had been struggling with nausea and diarrhea and that was why she was losing weight earlier this month.  She states she has a new routine with antiemetics to control nausea and the diarrhea has resolved.  She had been considering some herbal supplements to reduce her inflammation but she has reconsidered and isn't taking them.   PO lately: Ensure Max with frozen strawberries or banana sometimes at breakfast sometimes for evening meal Big meal at lunch time. Today had cube steak, tomato, cucumber onion and biscuits.  MEDICATIONS: taking compazine  daily in morning and Zofran  for breakthrough nausea.  Anthropometrics: Patient has lost 8# past month  Height: 60 Weight:  12/16/23  108.4# 12/02/23  113.5# UBW: 125-130, DBW: 120# BMI: 21.0  NUTRITION DIAGNOSIS: Inadequate PO intake to meet increased nutrient needs, r/t recent nausea and diarrhea  INTERVENTION:   Encouraged switching to 350 calorie ONS if using as meal replacement.  Suggested she ask Kelli for sample when in cancer center tomorrow.  Relayed it is safe for her to take 2-3 ONS daily if she needed for weight maintenance. Reviewed strategies to help with nausea. Suggested she d/c zinc supplement as she is getting it in [facility] and interferes with copper absorption. 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 Emailed Nutrition Tip sheet  for  nausea with contact information provided.   MONITORING, EVALUATION, GOAL: weight, PO intake, Nutrition Impact Symptoms, labs  Goal is weight maintenance or slow regain 2-4#/month to DBW  Next Visit: PRN at patient or provider request  Micheline Craven, RDN, LDN Registered Dietitian, [facility] Part Time Remote (Usual office hours: Tuesday-Thursday) Cell: [phone]

## 2023-12-23 NOTE — Telephone Encounter (Signed)
 Patient screened on MST. First attempt to reach. Provided my cell# on voice mail to return call to set up a nutrition consult.  Gennaro Africa, RDN, LDN Registered Dietitian, [facility] Part Time Remote (Usual office hours: Tuesday-Thursday) Cell: [phone]

## 2023-12-24 ENCOUNTER — Encounter: Payer: Self-pay | Admitting: Oncology

## 2023-12-24 ENCOUNTER — Encounter: Payer: Self-pay | Admitting: Hematology and Oncology

## 2023-12-24 ENCOUNTER — Inpatient Hospital Stay

## 2023-12-24 ENCOUNTER — Inpatient Hospital Stay (HOSPITAL_BASED_OUTPATIENT_CLINIC_OR_DEPARTMENT_OTHER): Admitting: Hematology and Oncology

## 2023-12-24 VITALS — BP 137/74 | HR 71 | Temp 98.1°F | Resp 14 | Ht 60.25 in | Wt 110.5 lb

## 2023-12-24 DIAGNOSIS — M8580 Other specified disorders of bone density and structure, unspecified site: Secondary | ICD-10-CM | POA: Diagnosis not present

## 2023-12-24 DIAGNOSIS — Z79899 Other long term (current) drug therapy: Secondary | ICD-10-CM | POA: Diagnosis not present

## 2023-12-24 DIAGNOSIS — R748 Abnormal levels of other serum enzymes: Secondary | ICD-10-CM

## 2023-12-24 DIAGNOSIS — C9110 Chronic lymphocytic leukemia of B-cell type not having achieved remission: Secondary | ICD-10-CM | POA: Diagnosis not present

## 2023-12-24 DIAGNOSIS — C8302 Small cell B-cell lymphoma, intrathoracic lymph nodes: Secondary | ICD-10-CM | POA: Diagnosis not present

## 2023-12-24 DIAGNOSIS — Z7969 Long term (current) use of other immunomodulators and immunosuppressants: Secondary | ICD-10-CM | POA: Diagnosis not present

## 2023-12-24 DIAGNOSIS — Z7902 Long term (current) use of antithrombotics/antiplatelets: Secondary | ICD-10-CM | POA: Diagnosis not present

## 2023-12-24 LAB — CMP (CANCER CENTER ONLY)
ALT: 67 U/L — ABNORMAL HIGH (ref 0–44)
AST: 41 U/L (ref 15–41)
Albumin: 4.3 g/dL (ref 3.5–5.0)
Alkaline Phosphatase: 90 U/L (ref 38–126)
Anion gap: 12 (ref 5–15)
BUN: 11 mg/dL (ref 8–23)
CO2: 27 mmol/L (ref 22–32)
Calcium: 9.6 mg/dL (ref 8.9–10.3)
Chloride: 99 mmol/L (ref 98–111)
Creatinine: 0.76 mg/dL (ref 0.44–1.00)
GFR, Estimated: >60 mL/min
Glucose, Bld: 91 mg/dL (ref 70–99)
Potassium: 3.8 mmol/L (ref 3.5–5.1)
Sodium: 137 mmol/L (ref 135–145)
Total Bilirubin: 0.3 mg/dL (ref 0.0–1.2)
Total Protein: 7.2 g/dL (ref 6.5–8.1)

## 2023-12-24 LAB — PHOSPHORUS: Phosphorus: 3.9 mg/dL (ref 2.5–4.6)

## 2023-12-24 LAB — URIC ACID: Uric Acid, Serum: 2.7 mg/dL (ref 2.5–7.1)

## 2023-12-24 LAB — CBC WITH DIFFERENTIAL (CANCER CENTER ONLY)
Abs Immature Granulocytes: 0.02 10*3/uL (ref 0.00–0.07)
Basophils Absolute: 0 10*3/uL (ref 0.0–0.1)
Basophils Relative: 0 %
Eosinophils Absolute: 0 10*3/uL (ref 0.0–0.5)
Eosinophils Relative: 0 %
HCT: 40.4 % (ref 36.0–46.0)
Hemoglobin: 13.4 g/dL (ref 12.0–15.0)
Immature Granulocytes: 1 %
Lymphocytes Relative: 5 %
Lymphs Abs: 0.2 10*3/uL — ABNORMAL LOW (ref 0.7–4.0)
MCH: 34.3 pg — ABNORMAL HIGH (ref 26.0–34.0)
MCHC: 33.2 g/dL (ref 30.0–36.0)
MCV: 103.3 fL — ABNORMAL HIGH (ref 80.0–100.0)
Monocytes Absolute: 0.3 10*3/uL (ref 0.1–1.0)
Monocytes Relative: 8 %
Neutro Abs: 3.1 10*3/uL (ref 1.7–7.7)
Neutrophils Relative %: 86 %
Platelet Count: 175 10*3/uL (ref 150–400)
RBC: 3.91 MIL/uL (ref 3.87–5.11)
RDW: 12.7 % (ref 11.5–15.5)
WBC Count: 3.6 10*3/uL — ABNORMAL LOW (ref 4.0–10.5)
nRBC: 0 % (ref 0.0–0.2)

## 2023-12-24 NOTE — Progress Notes (Signed)
 [facility]  [address] 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  She had been on observation only. She has had stable bilateral cervical and inguinal lymphadenopathy.  MRI imaging from February 2023 revealed extensive inguinal and retroperitoneal lymphadenopathy.  Her physical exam reveals a modest change in her cervical and supraclavicular adenopathy. PET scan in March 2023 revealed continued stability of mild adenopathy in the neck, chest, abdomen and pelvis with low level FDG and Deauville 2-3 category uptake. CT imaging in September 2023 shows the numerous mildly enlarged nodes to be unchanged. Her lymphadenopathy is relatively stable at this time but just mildly increased. CT chest, abdomen and pelvis in June 2024 revealed stable bilateral axillary and supraclavicular adenopathy, no mediastinal lymphadenopathy, with a new band of linear consolidation in the left upper lobe, felt to be post infectious consolidation. She has stable retroperitoneal and proximal iliac lymphadenopathy, and no new adenopathy in the abdomen and pelvis.  There is a normal spleen and no skeletal lesions. She has progression of her lymphadenopathy by exam and by CT scan.  We have started her on venetoclax  with a very slowly escalating dose of 20 mg daily for 1 week, 50 mg daily for 1 week, 100 mg daily for 1 week, and then 200 mg daily.  She is currently being seen weekly with labs including phosphorus and uric acid.  Baseline quantitative immunoglobulins and TSH were normal.   She has been taking venetoclax  200 mg since June 16.  She initially had significant neutropenia felt to be due to venetoclax , which continues to improve.  She has had a good response with  decreasing lymphadenopathy and normalization of her hemoglobin and platelets.  She has had and increase in the transaminases, which has fluctuated up and down.    At her visit last week, she had nausea, vomiting and diarrhea which she felt was due to venetoclax .  She started taking Compazine  in the morning with improvement in her nausea and vomiting the diarrhea resolved on its own.  She will continue venetoclax  200 mg daily.  We will plan to see her back in 2 weeks with a CBC, comprehensive metabolic panel, phosphorus and uric acid.  Abnormal transaminases Mild elevation of the transaminases.  Dr. Cornelius recommended she decrease atorvastatin  to 40 mg daily.  The ALT is slightly higher today.  The AST is normal.    The patient understands the plans discussed today and is in agreement with them.  She knows to contact our office if she develops concerns prior to her next appointment.   I provided 20 minutes of face-to-face time during this encounter and > 50% was spent counseling as documented under my assessment and plan.    Navreet Bolda A Jonthan Leite, PA-C  [facility] - A DEPT OF [facility] [address] [place] [address] Dept: [phone] Dept Fax: [phone]   No orders of the defined types were placed in this encounter.     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)  11/11/2015 Cancer Staging   Staging form: Hodgkin  and Non-Hodgkin Lymphoma, AJCC 8th Edition - Clinical stage from 11/11/2015: Stage III (Small lymphocytic leukemia) - Signed by Cornelius Wanda DEL, MD on 01/28/2021 Histopathologic type: Malignant lymphoma, small B lymphocytic, NOS (see also M-9823/3) Stage prefix: Initial diagnosis Diagnostic confirmation: Positive histology PLUS positive  immunophenotyping and/or positive genetic studies Specimen type: Core Needle Biopsy Staged by: Managing physician Stage used in treatment planning: Yes National guidelines used in treatment planning: Yes Type of national guideline used in treatment planning: NCCN Staging comments: Watchful waiting   06/20/2020 Initial Diagnosis   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)   Chronic lymphocytic leukemia (CLL), B-cell (HCC)  10/31/2015 Initial Diagnosis   Chronic lymphocytic leukemia (CLL), B-cell (HCC)   11/11/2015 Cancer Staging   Staging form: Chronic Lymphocytic Leukemia / Small Lymphocytic Lymphoma, AJCC 8th Edition - Clinical stage from 11/11/2015: Modified Rai Stage III (Modified Rai risk: High, Binet: Stage B, Lugano: Stage III, Lymphocytosis: Absent, Adenopathy: Present, Organomegaly: Absent, Anemia: Absent, Thrombocytopenia: Absent) - Signed by Cornelius Wanda DEL, MD on 04/05/2021 Histopathologic type: B-cell lymphocytic leukemia/small lymphocytic lymphoma (see also M-9670/3) Stage prefix: Initial diagnosis Stage used in treatment planning: Yes National guidelines used in treatment planning: Yes Type of national guideline used in treatment planning: NCCN       INTERVAL HISTORY:   Dawn Prince is here today for repeat clinical assessment.  She had nausea, vomiting and diarrhea last week.  She states her nausea is better and she has not had continued vomiting since taking compazine  daily.  The diarrhea resolved on it's own.  She reports fatigue in the afternoon. She continues to report intermittent night sweats, as well has hot flashes during the day.  She denies recurrent rashes.  She denies fevers or chills. She reports persistent left hip pain, for which Tylenol  is effective. Her appetite is good. Her weight has increased 2 pounds over last week.  She is planning a trip to the mountains for the next week.  Due for  REVIEW OF SYSTEMS:   Review of Systems   Constitutional:  Positive for diaphoresis (intermittent night sweats) and fatigue. Negative for appetite change, chills, fever and unexpected weight change.  HENT:   Negative for lump/mass, mouth sores and sore throat.   Respiratory:  Negative for cough and shortness of breath.   Cardiovascular:  Negative for chest pain and leg swelling.  Gastrointestinal:  Negative for abdominal pain, blood in stool, constipation, diarrhea, nausea and vomiting.  Endocrine: Positive for hot flashes.  Genitourinary:  Negative for difficulty urinating, dysuria, frequency, hematuria and vaginal bleeding.   Musculoskeletal:  Positive for arthralgias (left hip, chronic). Negative for back pain, gait problem, myalgias and neck pain.  Skin:  Negative for itching and rash.  Neurological:  Negative for dizziness, extremity weakness, gait problem, headaches, light-headedness and numbness.  Hematological:  Negative for adenopathy. Does not bruise/bleed easily.  Psychiatric/Behavioral:  Negative for depression and sleep disturbance. The patient is not nervous/anxious.      VITALS:   Blood pressure 137/74, pulse 71, temperature 98.1 F (36.7 C), temperature source Oral, resp. rate 14, height 5' 0.25 (1.53 m), weight 110 lb 8 oz (50.1 kg), SpO2 100%.  Wt Readings from Last 3 Encounters:  12/24/23 110 lb 8 oz (50.1 kg)  12/16/23 108 lb 6.4 oz (49.2 kg)  12/11/23 110 lb 6.4 oz (50.1 kg)    Body mass index is 21.4 kg/m.  Performance status (ECOG): 1 - Symptomatic but completely ambulatory    PHYSICAL EXAM:  Physical Exam Vitals and nursing note reviewed.  Constitutional:      General: She is not in acute distress.    Appearance: Normal appearance.  HENT:     Head: Normocephalic and atraumatic.     Mouth/Throat:     Mouth: Mucous membranes are moist.     Pharynx: Oropharynx is clear. No oropharyngeal exudate or posterior oropharyngeal erythema.  Eyes:     General: No scleral icterus.    Extraocular  Movements: Extraocular movements intact.     Conjunctiva/sclera: Conjunctivae normal.     Pupils: Pupils are equal, round, and reactive to light.  Cardiovascular:     Rate and Rhythm: Normal rate and regular rhythm.     Heart sounds: Normal heart sounds. No murmur heard.    No friction rub. No gallop.  Pulmonary:     Effort: Pulmonary effort is normal.     Breath sounds: Normal breath sounds. No wheezing, rhonchi or rales.  Abdominal:     General: There is no distension.     Palpations: Abdomen is soft. There is no hepatomegaly, splenomegaly or mass.     Tenderness: There is no abdominal tenderness.  Musculoskeletal:        General: Normal range of motion.     Cervical back: Normal range of motion and neck supple. No tenderness.     Right lower leg: No edema.     Left lower leg: No edema.  Lymphadenopathy:     Cervical: Cervical adenopathy present.     Upper Body:     Right upper body: No supraclavicular or axillary adenopathy.     Left upper body: Supraclavicular adenopathy present. No axillary adenopathy.     Lower Body: No right inguinal adenopathy. Left inguinal adenopathy present.     Comments: She has persistent mild lymphadenopathy in the left neck and supraclavicular area, as well as left inguinal area  Skin:    General: Skin is warm and dry.     Coloration: Skin is not jaundiced.     Findings: No rash.  Neurological:     Mental Status: She is alert and oriented to person, place, and time.     Cranial Nerves: No cranial nerve deficit.  Psychiatric:        Mood and Affect: Mood normal.        Behavior: Behavior normal.        Thought Content: Thought content normal.     LABS:      Latest Ref Rng & Units 12/24/2023   12:55 PM 12/16/2023    9:13 AM 12/11/2023   10:00 AM  CBC  WBC 4.0 - 10.5 K/uL 3.6  2.9  1.9   Hemoglobin 12.0 - 15.0 g/dL 86.5  86.9  87.1   Hematocrit 36.0 - 46.0 % 40.4  39.0  38.0   Platelets 150 - 400 K/uL 175  152  154     Latest Reference  Range & Units 12/24/23 12:55  Neutrophils % 86  Lymphocytes % 5  Monocytes Relative % 8  Eosinophil % 0  Basophil % 0  Immature Granulocytes % 1  NEUT# 1.7 - 7.7 K/uL 3.1  Lymphs Abs 0.7 - 4.0 K/uL 0.2 (L)  Monocyte # 0.1 - 1.0 K/uL 0.3  Eosinophils Absolute 0.0 - 0.5 K/uL 0.0  Basophils Absolute 0.0 - 0.1 K/uL 0.0  Abs Immature Granulocytes 0.00 - 0.07 K/uL 0.02  (L): Data is abnormally low     Latest Ref Rng & Units 12/24/2023  12:55 PM 12/16/2023    9:13 AM 12/11/2023   10:00 AM  CMP  Glucose 70 - 99 mg/dL 91  99  889   BUN 8 - 23 mg/dL 11  14  10    Creatinine 0.44 - 1.00 mg/dL 9.23  9.24  9.23   Sodium 135 - 145 mmol/L 137  135  133   Potassium 3.5 - 5.1 mmol/L 3.8  4.2  4.3   Chloride 98 - 111 mmol/L 99  100  98   CO2 22 - 32 mmol/L 27  25  24    Calcium  8.9 - 10.3 mg/dL 9.6  9.6  9.1   Total Protein 6.5 - 8.1 g/dL 7.2  7.2  7.1   Total Bilirubin 0.0 - 1.2 mg/dL 0.3  0.5  0.4   Alkaline Phos 38 - 126 U/L 90  76  78   AST 15 - 41 U/L 41  33  46   ALT 0 - 44 U/L 67  49  77     Lab Results  Component Value Date   TIBC 322 12/12/2022   TIBC 331 07/23/2021   FERRITIN 19 12/12/2022   FERRITIN 22 07/23/2021   IRONPCTSAT 30 12/12/2022   IRONPCTSAT 20 07/23/2021   Lab Results  Component Value Date   LDH 163 10/15/2023   LDH 162 07/01/2023   LDH 136 10/30/2021    STUDIES:   No results found.    HISTORY:   Past Medical History:  Diagnosis Date   Allergy    seasonal   Arthritis    Bipolar 1 disorder (HCC)    Cancer (HCC)    non hodgkins lymphoma   Depression    GERD (gastroesophageal reflux disease)    History of degenerative disc disease    Hyperlipidemia    Increased risk of breast cancer 04/25/2021   Obstructive sleep apnea    Osteopenia    Scoliosis    Thyroid  disease    hypothyroidism   Urticaria    Vitamin D deficiency     Past Surgical History:  Procedure Laterality Date   BREAST BIOPSY     BUBBLE STUDY  02/13/2021   Procedure: BUBBLE  STUDY;  Surgeon: Raford Riggs, MD;  Location: [facility] END[facility]COPY;  Service: Cardiovascular;;   CARPAL TUNNEL RELEASE Bilateral 2002   CESAREAN SECTION     x2   COLONOSCOPY  07/26/2008   Melanosis coli. Small internal hemorrhoids.    ENDOSCOPIC PLANTAR FASCIOTOMY     ESOPHAGOGASTRODUODENOSCOPY  03/17/2013   Mild gastritis. Status post esophageal dilatation.   LYMPH NODE BIOPSY     right hip repair torn tendon Right 09/2021   TEE WITHOUT CARDIOVERSION N/A 02/13/2021   Procedure: TRANSESOPHAGEAL ECHOCARDIOGRAM (TEE);  Surgeon: Raford Riggs, MD;  Location: [facility] ENDOSCOPY;  Service: Cardiovascular;  Laterality: N/A;   WISDOM TOOTH EXTRACTION      Family History  Problem Relation Age of Onset   Prostate cancer Father 49   Melanoma Father 12   High blood pressure Father    Breast cancer Maternal Aunt    Multiple myeloma Maternal Aunt    Breast cancer Maternal Aunt    Colon cancer Neg Hx    Colon polyps Neg Hx    Esophageal cancer Neg Hx    Rectal cancer Neg Hx    Stomach cancer Neg Hx     Social History:  reports that she has never smoked. She has never used smokeless tobacco. She reports that she does not currently use alcohol.  She reports that she does not use drugs.The patient is accompanied by husband today.  Allergies:  Allergies  Allergen Reactions   Diclofenac Sodium Other (See Comments)    Thought it had something to do with her having a stroke   Voltaren [Diclofenac Sodium]     Thought it had something to do with her having a stroke   Nsaids Other (See Comments)    Had a stroke and worries it was related to the Diclofenac she took.    Current Medications: Current Outpatient Medications  Medication Sig Dispense Refill   Ascorbic Acid (VITAMIN C) 1000 MG tablet Take 1,000 mg by mouth 2 (two) times daily.     atorvastatin  (LIPITOR) 80 MG tablet Take 1 tablet (80 mg total) by mouth daily. 30 tablet 5   cetirizine (ZYRTEC) 10 MG tablet Take 10 mg by mouth 2 (two) times  daily.     estradiol (ESTRACE) 0.1 MG/GM vaginal cream 2 (two) times a week.     famotidine  (PEPCID ) 20 MG tablet Take 1 tablet (20 mg total) by mouth 2 (two) times daily. 60 tablet 2   FLUoxetine  HCl (PROZAC  PO) Take 60 mg by mouth daily.     Multiple Vitamin (MULTIVITAMIN ADULT PO) Take by mouth. Vision MD once a day     ondansetron  (ZOFRAN -ODT) 4 MG disintegrating tablet Take 1 tablet (4 mg total) by mouth every 8 (eight) hours as needed for nausea or vomiting. 20 tablet 2   PLAVIX  75 MG tablet Take 75 mg by mouth daily.     Probiotic Product (PROBIOTIC BLEND PO) Take by mouth at bedtime.     prochlorperazine  (COMPAZINE ) 10 MG tablet Take 1 tablet (10 mg total) by mouth every 6 (six) hours as needed for nausea or vomiting. 30 tablet 5   ramipril  (ALTACE ) 2.5 MG capsule Take by mouth. 1 in am in 1 in pm     venetoclax  (VENCLEXTA ) 100 MG tablet Take 2 tablets (200 mg total) by mouth daily. Tablets should be swallowed whole with a meal and a full glass of water. 60 tablet 5   No current facility-administered medications for this visit.

## 2023-12-25 ENCOUNTER — Encounter: Payer: Self-pay | Admitting: Oncology

## 2023-12-25 ENCOUNTER — Other Ambulatory Visit (HOSPITAL_COMMUNITY): Payer: Self-pay

## 2023-12-25 ENCOUNTER — Other Ambulatory Visit: Payer: Self-pay | Admitting: Oncology

## 2023-12-25 DIAGNOSIS — E785 Hyperlipidemia, unspecified: Secondary | ICD-10-CM

## 2023-12-25 NOTE — Assessment & Plan Note (Signed)
 Mild elevation of the transaminases.  Dr. Cornelius recommended she decrease atorvastatin  to 40 mg daily.  The ALT is slightly higher today.  The AST is normal.

## 2023-12-25 NOTE — Assessment & Plan Note (Addendum)
 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  She had been on observation only. She has had stable bilateral cervical and inguinal lymphadenopathy.  MRI imaging from February 2023 revealed extensive inguinal and retroperitoneal lymphadenopathy.  Her physical exam reveals a modest change in her cervical and supraclavicular adenopathy. PET scan in March 2023 revealed continued stability of mild adenopathy in the neck, chest, abdomen and pelvis with low level FDG and Deauville 2-3 category uptake. CT imaging in September 2023 shows the numerous mildly enlarged nodes to be unchanged. Her lymphadenopathy is relatively stable at this time but just mildly increased. CT chest, abdomen and pelvis in June 2024 revealed stable bilateral axillary and supraclavicular adenopathy, no mediastinal lymphadenopathy, with a new band of linear consolidation in the left upper lobe, felt to be post infectious consolidation. She has stable retroperitoneal and proximal iliac lymphadenopathy, and no new adenopathy in the abdomen and pelvis.  There is a normal spleen and no skeletal lesions. She has progression of her lymphadenopathy by exam and by CT scan.  We have started her on venetoclax  with a very slowly escalating dose of 20 mg daily for 1 week, 50 mg daily for 1 week, 100 mg daily for 1 week, and then 200 mg daily.  She is currently being seen weekly with labs including phosphorus and uric acid.  Baseline quantitative immunoglobulins and TSH were normal.   She has been taking venetoclax  200 mg since June 16.  She initially had significant neutropenia felt to be due to venetoclax , which continues to improve.  She has had a good response with decreasing lymphadenopathy and normalization of her hemoglobin and platelets.  She has had and increase in the transaminases, which has fluctuated up and down.    At her visit last week, she had nausea, vomiting and diarrhea which she felt was due to venetoclax .  She started  taking Compazine  in the morning with improvement in her nausea and vomiting the diarrhea resolved on its own.  She will continue venetoclax  200 mg daily.  We will plan to see her back in 2 weeks with a CBC, comprehensive metabolic panel, phosphorus and uric acid.

## 2023-12-26 ENCOUNTER — Telehealth: Payer: Self-pay

## 2023-12-26 ENCOUNTER — Encounter: Payer: Self-pay | Admitting: Oncology

## 2023-12-26 NOTE — Telephone Encounter (Signed)
 Attempted to contact patient. No answer.

## 2023-12-26 NOTE — Telephone Encounter (Signed)
-----   Message from [name] sent at 12/25/2023 12:33 PM EDT ----- Regarding: call Tell her I heard back from Dr. Harlene Bogaert and she wanted to base decision on lipid panel results, makes sense. So when she comes in next time, would like her to be fasting and I will add lipid testing

## 2023-12-30 ENCOUNTER — Telehealth: Payer: Self-pay

## 2023-12-30 ENCOUNTER — Other Ambulatory Visit: Payer: Self-pay

## 2023-12-30 NOTE — Telephone Encounter (Signed)
 Attempted call to see how pt is feeling this week, no answer.

## 2023-12-31 ENCOUNTER — Encounter: Payer: Self-pay | Admitting: Oncology

## 2023-12-31 NOTE — Telephone Encounter (Signed)
 Spoke with Dawn Prince. She and spouse are in [place] [place], celebrating their 46th wedding anniversary. They have good weather and have enjoyed their time, plan to come home tomorrow. She states, the nausea, vomiting and diarrhea have finally turned around. The diarrhea stopped after last appt. I'm taking a compazine  in the morning and it sets me for the day. She continues to take the Venclexta  between 8a-830a. No missed doses. She denies mouth sores, fever, and chills. She is having insomnia, but dealing with as much as she can. She has told me in past that she usually has to take a nap during the day. She deals with fatigue daily. She is also drinking some protein shakes that she makes with fruit in her Ninja. Pt aware to call us  if she develops temp of 100.4 or higher, day or night.

## 2024-01-01 ENCOUNTER — Telehealth: Payer: Self-pay | Admitting: Adult Health

## 2024-01-01 ENCOUNTER — Encounter (INDEPENDENT_AMBULATORY_CARE_PROVIDER_SITE_OTHER): Payer: Self-pay

## 2024-01-01 ENCOUNTER — Other Ambulatory Visit: Payer: Self-pay

## 2024-01-01 NOTE — Telephone Encounter (Signed)
 Pt would like to speak to the nurse regarding blood work that she is needing done. Please advise.

## 2024-01-01 NOTE — Telephone Encounter (Signed)
 Patient returned call, transferred to POD3

## 2024-01-01 NOTE — Telephone Encounter (Signed)
 Attempted to call Pt. No answer, LVM for call back.

## 2024-01-01 NOTE — Telephone Encounter (Signed)
 Pt called back stating that she knew Harlene would want to see her Lipid panel before coming in and so she was asking should she have that drawn prior to the visit. Advised Dr Cornelius has ordered the lipid panel and advised the pt that she should be able to have it drawn and we will be able to access if if its resulted before her visit. She was appreciative for the information.

## 2024-01-01 NOTE — Progress Notes (Signed)
 Specialty Pharmacy Refill Coordination Note  Dawn Prince is a 63 y.o. female contacted today regarding refills of specialty medication(s) Venetoclax  (VENCLEXTA )   Patient requested Delivery   Delivery date: 01/05/24   Verified address: [address]   [place] [address]   Medication will be filled on 01/02/24.

## 2024-01-05 ENCOUNTER — Inpatient Hospital Stay: Attending: Hematology and Oncology

## 2024-01-05 DIAGNOSIS — M8580 Other specified disorders of bone density and structure, unspecified site: Secondary | ICD-10-CM | POA: Diagnosis not present

## 2024-01-05 DIAGNOSIS — Z807 Family history of other malignant neoplasms of lymphoid, hematopoietic and related tissues: Secondary | ICD-10-CM | POA: Diagnosis not present

## 2024-01-05 DIAGNOSIS — Z808 Family history of malignant neoplasm of other organs or systems: Secondary | ICD-10-CM | POA: Diagnosis not present

## 2024-01-05 DIAGNOSIS — C9110 Chronic lymphocytic leukemia of B-cell type not having achieved remission: Secondary | ICD-10-CM | POA: Insufficient documentation

## 2024-01-05 DIAGNOSIS — Z803 Family history of malignant neoplasm of breast: Secondary | ICD-10-CM | POA: Insufficient documentation

## 2024-01-05 DIAGNOSIS — E785 Hyperlipidemia, unspecified: Secondary | ICD-10-CM

## 2024-01-05 DIAGNOSIS — C8302 Small cell B-cell lymphoma, intrathoracic lymph nodes: Secondary | ICD-10-CM

## 2024-01-05 DIAGNOSIS — Z7969 Long term (current) use of other immunomodulators and immunosuppressants: Secondary | ICD-10-CM | POA: Diagnosis not present

## 2024-01-05 DIAGNOSIS — R7401 Elevation of levels of liver transaminase levels: Secondary | ICD-10-CM | POA: Insufficient documentation

## 2024-01-05 DIAGNOSIS — Z79899 Other long term (current) drug therapy: Secondary | ICD-10-CM | POA: Insufficient documentation

## 2024-01-05 DIAGNOSIS — Z7902 Long term (current) use of antithrombotics/antiplatelets: Secondary | ICD-10-CM | POA: Insufficient documentation

## 2024-01-05 LAB — CMP (CANCER CENTER ONLY)
ALT: 46 U/L — ABNORMAL HIGH (ref 0–44)
AST: 36 U/L (ref 15–41)
Albumin: 4.8 g/dL (ref 3.5–5.0)
Alkaline Phosphatase: 71 U/L (ref 38–126)
Anion gap: 13 (ref 5–15)
BUN: 15 mg/dL (ref 8–23)
CO2: 25 mmol/L (ref 22–32)
Calcium: 9.5 mg/dL (ref 8.9–10.3)
Chloride: 99 mmol/L (ref 98–111)
Creatinine: 0.68 mg/dL (ref 0.44–1.00)
GFR, Estimated: >60 mL/min (ref >60)
Glucose, Bld: 120 mg/dL — ABNORMAL HIGH (ref 70–99)
Potassium: 4 mmol/L (ref 3.5–5.1)
Sodium: 137 mmol/L (ref 135–145)
Total Bilirubin: 0.5 mg/dL (ref 0.0–1.2)
Total Protein: 7.3 g/dL (ref 6.5–8.1)

## 2024-01-05 LAB — CBC WITH DIFFERENTIAL (CANCER CENTER ONLY)
Abs Immature Granulocytes: 0 K/uL (ref 0.00–0.07)
Basophils Absolute: 0 K/uL (ref 0.0–0.1)
Basophils Relative: 1 %
Eosinophils Absolute: 0 K/uL (ref 0.0–0.5)
Eosinophils Relative: 0 %
HCT: 39.9 % (ref 36.0–46.0)
Hemoglobin: 13.3 g/dL (ref 12.0–15.0)
Immature Granulocytes: 0 %
Lymphocytes Relative: 8 %
Lymphs Abs: 0.2 K/uL — ABNORMAL LOW (ref 0.7–4.0)
MCH: 33.9 pg (ref 26.0–34.0)
MCHC: 33.3 g/dL (ref 30.0–36.0)
MCV: 101.8 fL — ABNORMAL HIGH (ref 80.0–100.0)
Monocytes Absolute: 0.3 K/uL (ref 0.1–1.0)
Monocytes Relative: 13 %
Neutro Abs: 1.5 K/uL — ABNORMAL LOW (ref 1.7–7.7)
Neutrophils Relative %: 78 %
Platelet Count: 167 K/uL (ref 150–400)
RBC: 3.92 MIL/uL (ref 3.87–5.11)
RDW: 12.4 % (ref 11.5–15.5)
WBC Count: 1.9 K/uL — ABNORMAL LOW (ref 4.0–10.5)
nRBC: 0 % (ref 0.0–0.2)

## 2024-01-05 LAB — LIPID PANEL
Cholesterol: 141 mg/dL (ref 0–200)
HDL: 71 mg/dL (ref >40)
LDL Cholesterol: 55 mg/dL (ref 0–99)
Total CHOL/HDL Ratio: 2 ratio
Triglycerides: 74 mg/dL (ref <150)
VLDL: 15 mg/dL (ref 0–40)

## 2024-01-06 NOTE — Progress Notes (Signed)
 [facility] [address] [place]. [place] [address] [phone]       STROKE FOLLOW UP NOTE  Dawn Prince Date of Birth:  January 06, 1961 Medical Record Number:  [id number]    Primary neurologist: Dr. Rosemarie Reason for visit: stroke follow up    SUBJECTIVE:   CHIEF COMPLAINT:  Chief Complaint  Patient presents with   Cerebrovascular Accident    Rm 8 with apouse Pt is well, reports on going concerns with L hand numbness, imbalance and and headaches.  No new concerns.     HPI:   Update 01/07/2024 JM: Patient returns for follow-up visit.  Further work up after prior visit completed. MRA head/neck showed L PCA P1 and P2 stenosis otherwise unremarkable.  2D echo normal.  LDL 162 at prior visit and Dr. Rosemarie recommended restarting atorvastatin  80 mg daily but dosage decreased by oncology to 40mg  due to elevated LFTs.  Had repeat labs yesterday with LDL 55 and gradually improving LFTs.  Currently, she has been overall stable from stroke standpoint without new stroke/TIA symptoms. She reports continued chronic left hand numbness which can fluctuate and balance issues. Ambulates with a cane, denies any recent falls. She also has low back pain and continued left hip pain likely contributing. She does admit to taking acetaminophen  frequently recently, usually 2-4x 4-6 days per week the past 2 to 3 months due to hip pain. She is scheduled to see orthopedics in a couple weeks.  She does complain of headaches, about 2-3x per week which can last several hours. Usually located right side with pressure sensation, denies photophobia, phonophobia or N/V. She has been experiencing insomnia which she attributes to her cancer medication. Reports compliance on Plavix  and atorvastatin  40mg  daily. At visit in 2023, she was on atorvastatin  and was switched to Crestor  back due to elevated LFTs but patient reports she never went on Crestor  and started a high potency omega-3  supplement.  Routinely follows with PCP, has follow-up visit in the next couple weeks.       History provided for reference purposes only Update 06/17/2023 Dr. Rosemarie: Dawn Prince is a pleasant 63 year old Caucasian lady seen today for initial office consultation visit for stroke.  She is accompanied by her husband.  History is obtained from the patient and review of electronic medical records.  I personally viewed pertinent available imaging films in PACS.  She has past medical history of hyperlipidemia, bipolar disorder, gastroesophageal reflux disease, obstructive sleep apnea, osteopenia, thyroid  disease, vitamin D deficiency.  Patient states on 05/26/2023 she woke up from sleep with slurred speech and tongue deviated to the left.  She did not go to the ER despite her symptoms persisting.  She eventually saw her primary care physician 3 days later.  She denies any extremity weakness numbness tingling or gait or balance difficulties at this time.  An outpatient MRI scan of the brain was done at [facility] on 06/09/2023 which I personally reviewed and reviewed films which were brought to me in disc.  There is a 1 cm right corona radiata subacute infarct which shows postcontrast enhancement.  No other stroke related neurovascular imaging on testing was done.  Patient was previously on aspirin  and Plavix  was added which she has been taking for the last 3 weeks and tolerating well without significant bleeding or bruising she feels her speech has improved and tongue movements have also improved.  She is currently wearing a 30-day heart monitor.  Patient does have a prior history  of right thalamic infarct in September 2022 from small vessel disease.  She was seen by me in the office at that time and last office visit was on 3 /1/23 with [facility] nurse practitioner.   Update 07/24/2021 JM: patient returns for stroke follow up accompanied by her husband. Overall stable from stroke standpoint. Reports residual  left hand outer hand pins/needle sensation. Denies any residual left foot symptoms. Does stilt have fatigue, possibly slightly worsened, working with oncology - scheduled for PET scan tomorrow, recent right hip imaging showed enlargement of groin lymph nodes and some abnormal lab work. Compliant on aspirin  without side effects. Remains on atorvastatin  - had decreased dose from 40 to 20 due to elevated LFT's back in December - since improved since decreasing dose, otherwise tolerating without side effects.  Blood pressure today 103/60. Does not routinely monitor at home. Reports recent labs by PCP including cholesterol levels on 1/23 which showed LDL 98 (pt showed results via her portal - unable to view via epic).  No further concerns at this time.  Update 03/29/2021 JM: patient being seen for stroke follow up accompanied by her husband.   Overall stable since discharge. Denies new stroke/TIA symptoms.  Fatigues quick - present prior to her stroke with slight worsening post stroke Left outer hand and outer foot numbness - greatly improving  Short term memory difficulties and occasional difficulty finding the correct word but has been gradually improving Balance has been gradually improving - no longer uses AD  Chronic back pain - [facility] recently received right hip injection for bursitis  Continues on aspirin  and plavix  despite 3 week recommendations - mild bruising otherwise no side effects Continues on atorvastatin  40mg  daily - denies side effects Blood pressure 122/78 - occasionally monitors at home - usually stable  No further concerns at this time  Stroke admission 02/08/2021 [name] is a 63 y.o. 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  Personally reviewed hospitalization pertinent progress notes, lab work and imaging.  Evaluated by Dr. Rosemarie for right thalamic infarct secondary to small vessel disease.  CTA head/neck unremarkable.  2D echo EF 60 to 65% although echodensity noted therefore TEE completed which showed normal biventricular function without evidence of hemodynamically significant valvular heart disease, no evidence of thrombus and negative bubble study for intra-arterial shunt.  LDL 78.  A1c 6.1.  Recommended DAPT for 3 weeks and aspirin  alone.  Increase home dose atorvastatin  from 20 mg to 40 mg daily.  Evidence of pre-DM -no prior DM diagnosis.  No prior stroke history. CT spine showed diffuse lymphadenopathy throughout the neck consistent with B-cell lymphoma grossly stable since prior CT 09/2020.      PERTINENT IMAGING  CT head No acute infarct or hemorrhage, no metastatic disease.  CT spine No acute cervical spine fracture. Mild anterolisthesis of C4 on C5, likely due to facet hypertrophy. Prominent spondylosis at C5-6 and C6-7 with minimal neural foraminal encroachment.  Diffuse lymphadenopathy throughout the neck, consistent with B-cell lymphoma. Grossly stable since prior CT 10/11/2020. CTA head/neck normal MRI brain Acute small vessel infarction of the posterolateral right thalamus. Mild chronic small-vessel ischemic changes elsewhere affecting the cerebral hemispheric white matter.  2D Echo EF 60 to 65% however aortic valve disease noted TEE EF 60 to 65%.  Normal biventricular function with no evidence of hemodynamically significant valvular heart disease, no evidence of thrombus negative bubble study for intra-arterial shunt    ROS:   14 system review of systems performed and negative with exception of those listed in HPI  PMH:  Past Medical History:  Diagnosis Date   Allergy     seasonal   Arthritis    Bipolar 1 disorder (HCC)    Cancer (HCC)    non hodgkins lymphoma   Depression    GERD (gastroesophageal reflux disease)    History of degenerative disc disease    Hyperlipidemia    Increased risk of breast cancer 04/25/2021   Obstructive sleep apnea    Osteopenia    Scoliosis    Thyroid  disease    hypothyroidism   Urticaria    Vitamin D deficiency     PSH:  Past Surgical History:  Procedure Laterality Date   BREAST BIOPSY     BUBBLE STUDY  02/13/2021   Procedure: BUBBLE STUDY;  Surgeon: Raford Riggs, MD;  Location: [facility] ENDOSCOPY;  Service: Cardiovascular;;   CARPAL TUNNEL RELEASE Bilateral 2002   CESAREAN SECTION     x2   COLONOSCOPY  07/26/2008   Melanosis coli. Small internal hemorrhoids.    ENDOSCOPIC PLANTAR FASCIOTOMY     ESOPHAGOGASTRODUODENOSCOPY  03/17/2013   Mild gastritis. Status post esophageal dilatation.   LYMPH NODE BIOPSY     right hip repair torn tendon Right 09/2021   TEE WITHOUT CARDIOVERSION N/A 02/13/2021   Procedure: TRANSESOPHAGEAL ECHOCARDIOGRAM (TEE);  Surgeon: Raford Riggs, MD;  Location: [facility] ENDOSCOPY;  Service: Cardiovascular;  Laterality: N/A;   WISDOM TOOTH EXTRACTION      Social History:  Social History   Socioeconomic History   Marital status: Married    Spouse name: Not on file   Number of children: 2   Years of education: Not on file   Highest education level: Associate degree: academic program  Occupational History   Not on file  Tobacco Use   Smoking status: Never   Smokeless tobacco: Never  Vaping Use   Vaping status: Never Used  Substance and Sexual Activity   Alcohol use: Not Currently   Drug use: Never   Sexual activity: Not on file  Other Topics Concern   Not on file  Social History Narrative   Right handed    1-2 cups of coffee per week   Lives at home with husband    Social Drivers of Corporate investment banker Strain: Not on file  Food Insecurity: Not on file   Transportation Needs: Not on file  Physical Activity: Not on file  Stress: Not on file  Social Connections: Unknown (10/09/2021)   [facility]   Social Network    Social Network: Not on file  Intimate Partner Violence: Unknown (08/31/2021)   Received from [facility]   HITS    Physically Hurt: Not on file    Insult or Talk Down To: Not on file    Threaten Physical Harm: Not on file    Scream or Curse: Not on file    Family History:  Family History  Problem Relation Age of Onset   Prostate cancer Father 45   Melanoma Father 64   High blood pressure Father    Breast cancer Maternal Aunt    Multiple myeloma Maternal Aunt    Breast cancer  Maternal Aunt    Colon cancer Neg Hx    Colon polyps Neg Hx    Esophageal cancer Neg Hx    Rectal cancer Neg Hx    Stomach cancer Neg Hx     Medications:   Current Outpatient Medications on File Prior to Visit  Medication Sig Dispense Refill   Ascorbic Acid (VITAMIN C) 1000 MG tablet Take 1,000 mg by mouth 2 (two) times daily.     atorvastatin  (LIPITOR) 80 MG tablet Take 1 tablet (80 mg total) by mouth daily. 30 tablet 5   estradiol (ESTRACE) 0.1 MG/GM vaginal cream 2 (two) times a week.     FLUoxetine  HCl (PROZAC  PO) Take 60 mg by mouth daily.     Multiple Vitamin (MULTIVITAMIN ADULT PO) Take by mouth. Vision MD once a day     ondansetron  (ZOFRAN -ODT) 4 MG disintegrating tablet Take 1 tablet (4 mg total) by mouth every 8 (eight) hours as needed for nausea or vomiting. 20 tablet 2   PLAVIX  75 MG tablet Take 75 mg by mouth daily.     Probiotic Product (PROBIOTIC BLEND PO) Take by mouth at bedtime.     prochlorperazine  (COMPAZINE ) 10 MG tablet Take 1 tablet (10 mg total) by mouth every 6 (six) hours as needed for nausea or vomiting. 30 tablet 5   ramipril  (ALTACE ) 2.5 MG capsule Take by mouth. 1 in am in 1 in pm     venetoclax  (VENCLEXTA ) 100 MG tablet Take 2 tablets (200 mg total) by mouth daily. Tablets should be swallowed  whole with a meal and a full glass of water. 60 tablet 5   No current facility-administered medications on file prior to visit.    Allergies:   Allergies  Allergen Reactions   Diclofenac Sodium Other (See Comments)    Thought it had something to do with her having a stroke   Voltaren [Diclofenac Sodium]     Thought it had something to do with her having a stroke   Nsaids Other (See Comments)    Had a stroke and worries it was related to the Diclofenac she took.      OBJECTIVE:  Physical Exam  Vitals:   01/07/24 1115  BP: 130/81  Pulse: 71  Weight: 110 lb (49.9 kg)  Height: 5' (1.524 m)   Body mass index is 21.48 kg/m. No results found.  General: Frail petite very pleasant middle-aged Caucasian female, seated, in no evident distress  Neurologic Exam Mental Status: Awake and fully alert.  Fluent speech and language.  Oriented to place and time. Recent memory subjectively mildly impaired and remote memory intact. Attention span, concentration and fund of knowledge appropriate. Mood and affect appropriate.  Cranial Nerves: Pupils equal, briskly reactive to light. Extraocular movements full without nystagmus. Visual fields full to confrontation. Hearing intact. Facial sensation intact. Face, tongue, palate moves normally and symmetrically.  Motor: Normal bulk and tone. Normal strength in all tested extremity muscles Sensory.:  Decreased/altered sensation left outer hand otherwise sensation intact Coordination: Rapid alternating movements normal in all extremities. Finger-to-nose and heel-to-shin performed accurately bilaterally. Slightly orbits right arm over left arm. Gait and Station: Arises from chair without difficulty. Stance is normal. Gait demonstrates antalgic gait with slow cautious steps and mild favoring of left leg with use of cane. Reflexes: 1+ and symmetric. Toes downgoing.        ASSESSMENT: Dawn Prince is a 63 y.o. year old female with right  subcortical lacunar stroke in 04/2023 2/2 SVD  after presenting with slurred speech and tongue deviation. Hx of right thalamic stroke in 01/2021 secondary to small vessel disease. 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     PLAN:  Right subcortical stroke: Hx of Right thalamic stroke:  Residual deficit: mild left hand sensory impairment and mild memory loss - stable Continue Plavix  75 mg daily and atorvastatin  40 mg daily for secondary stroke prevention managed/prescribed by PCP Stroke labs: LDL 55 (12/2023), A1c 5.5 (12/7972) Further workup for 04/2023 stroke - MRA head/neck  left PCA moderate P2 and mild P1 stenosis, 2D echo 60 to 65%, cardiac monitor negative for A-fib Discussed secondary stroke prevention measures and importance of close PCP follow up for aggressive stroke risk factor management including HTN with BP goal<130/90, HLD with LDL goal<70 and pre-DM with A1c goal<7.  I have gone over the pathophysiology of stroke, warning signs and symptoms, risk factors and their management in some detail with instructions to go to the closest emergency room for symptoms of concern.  Mixed headaches:  Reports chronic headaches, suspect current headaches mixed from frequent acetaminophen  use with analgesic rebound, insomnia and possible side effect from cancer medication. Recommend initiating amitriptyline  10 mg nightly -she was advised to call after 2 to 3 weeks if no benefit for dosage increase Advised to limit use of acetaminophen  to no more than 2 times per week to reduce risk of rebound headache. Also advised possible hepatic side effects with recent LFTs.      Follow-up in 6 months or call earlier if needed     CC:  PCP: Jefferey Fitch, MD    I personally spent a total of 45 minutes in the care of the patient today including preparing to see the patient, performing a medically appropriate exam/evaluation,  counseling and educating, placing orders, and documenting clinical information in the EHR.   Harlene Bogaert, AGNP-BC  [facility] [address] [place], [place] [address]  Phone [phone] Fax [phone] Note: This document was prepared with digital dictation and possible smart phrase technology. Any transcriptional errors that result from this process are unintentional.

## 2024-01-07 ENCOUNTER — Encounter: Payer: Self-pay | Admitting: Adult Health

## 2024-01-07 ENCOUNTER — Ambulatory Visit: Payer: Medicare Other | Admitting: Adult Health

## 2024-01-07 VITALS — BP 130/81 | HR 71 | Ht 60.0 in | Wt 110.0 lb

## 2024-01-07 DIAGNOSIS — I639 Cerebral infarction, unspecified: Secondary | ICD-10-CM | POA: Diagnosis not present

## 2024-01-07 DIAGNOSIS — R519 Headache, unspecified: Secondary | ICD-10-CM

## 2024-01-07 DIAGNOSIS — I6381 Other cerebral infarction due to occlusion or stenosis of small artery: Secondary | ICD-10-CM

## 2024-01-07 MED ORDER — AMITRIPTYLINE HCL 10 MG PO TABS: 10.0000 mg | ORAL_TABLET | Freq: Every day | ORAL | 11 refills | Status: DC

## 2024-01-07 NOTE — Patient Instructions (Signed)
 Start amitriptyline  10mg  nightly for headaches and possibly help with sleep and pain - if after 2 weeks, headaches persist, please let me know and we can consider increasing the dosage  Please try to limit tylenol  to no more than 2 times per week as frequent use can affect your liver and cause worsening headaches  Continue clopidogrel  75 mg daily  and atorvastatin   for secondary stroke prevention  Continue to follow up with PCP regarding blood pressure and cholesterol management  Maintain strict control of hypertension with blood pressure goal below 130/90, diabetes with hemoglobin A1c goal below 7.0 % and cholesterol with LDL cholesterol (bad cholesterol) goal below 70 mg/dL.   Signs of a Stroke? Follow the BEFAST method:  Balance Watch for a sudden loss of balance, trouble with coordination or vertigo Eyes Is there a sudden loss of vision in one or both eyes? Or double vision?  Face: Ask the person to smile. Does one side of the face droop or is it numb?  Arms: Ask the person to raise both arms. Does one arm drift downward? Is there weakness or numbness of a leg? Speech: Ask the person to repeat a simple phrase. Does the speech sound slurred/strange? Is the person confused ? Time: If you observe any of these signs, call 911.      Followup in the future with me in 6 months or call earlier if needed       Thank you for coming to see us  at [facility]. I hope we have been able to provide you high quality care today.  You may receive a patient satisfaction survey over the next few weeks. We would appreciate your feedback and comments so that we may continue to improve ourselves and the health of our patients.

## 2024-01-08 ENCOUNTER — Telehealth: Payer: Self-pay

## 2024-01-08 NOTE — Telephone Encounter (Signed)
 Makyah called to make us  aware that Oneil has tested positive for COVID. They feel most likely he got if from grandchildren over the weekend. She has tested negative, but has noticed little cough. Afebrile. Wants to cx appt for tomorrow with you. States she did see the neurologist, & labs were done. They started her on low dose elavil  for insomnia, and headaches. Her next scheduled appt is 01/23/2024.

## 2024-01-08 NOTE — Progress Notes (Incomplete)
 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  She had been on observation only. She has had stable bilateral cervical and inguinal lymphadenopathy.  MRI imaging from February 2023 revealed extensive inguinal and retroperitoneal lymphadenopathy.  Her physical exam reveals a modest change in her cervical and supraclavicular adenopathy. PET scan in March 2023 revealed continued stability of mild adenopathy in the neck, chest, abdomen and pelvis with low level FDG and Deauville 2-3 category uptake. CT imaging in September 2023 shows the numerous mildly enlarged nodes to be unchanged. Her lymphadenopathy is relatively stable at this time but just mildly increased. CT chest, abdomen and pelvis in June 2024 revealed stable bilateral axillary and supraclavicular adenopathy, no mediastinal lymphadenopathy, with a new band of linear consolidation in the left upper lobe, felt to be post infectious consolidation. She has stable retroperitoneal and proximal iliac lymphadenopathy, and no new adenopathy in the abdomen and pelvis.  There is a normal spleen and no skeletal lesions. She has progression of her lymphadenopathy by exam and by CT scan.  We have started her on venetoclax  with a very slowly escalating dose of 20 mg daily for 1 week, 50 mg daily for 1 week, 100 mg daily for 1 week, and then 200 mg daily.  She is currently being seen weekly with labs including phosphorus and uric acid.  Baseline quantitative immunoglobulins and TSH were normal.    She has been taking venetoclax  200 mg since June 16.  She initially had significant neutropenia felt to be due to venetoclax , which continues to improve.  She has had a good response with decreasing  lymphadenopathy and normalization of her hemoglobin and platelets.  She has had and increase in the transaminases, which has fluctuated up and down.     At her visit last week, she had nausea, vomiting and diarrhea which she felt was due to venetoclax .  She started taking Compazine  in the morning with improvement in her nausea and vomiting the diarrhea resolved on its own.  She will continue venetoclax  200 mg daily.  We will plan to see her back in 2 weeks with a CBC, comprehensive metabolic panel, phosphorus and uric acid.   Abnormal transaminases Mild elevation of the transaminases.  Dr. Cornelius recommended she decrease atorvastatin  to 40 mg daily.  The ALT is slightly higher today.  The AST is normal.  Plan:    The patient understands the plans discussed today and is in agreement with them.  She knows to contact our office if she develops concerns prior to her next appointment.   I provided *** minutes of face-to-face time during this encounter and > 50% was spent counseling as documented under my assessment and plan.    Wanda Cornelius, MD [facility] [address] [place] [address] Dept: [phone] Dept Fax: [phone]   No orders of the defined types were placed in this encounter.     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)  11/11/2015 Cancer Staging   Staging form:  Hodgkin and Non-Hodgkin Lymphoma, AJCC 8th Edition - Clinical stage from 11/11/2015: Stage III (Small lymphocytic leukemia) - Signed by Cornelius Wanda DEL, MD on 01/28/2021 Histopathologic type: Malignant lymphoma, small B lymphocytic, NOS (see also M-9823/3) Stage prefix: Initial diagnosis Diagnostic confirmation: Positive histology PLUS positive  immunophenotyping and/or positive genetic studies Specimen type: Core Needle Biopsy Staged by: Managing physician Stage used in treatment planning: Yes National guidelines used in treatment planning: Yes Type of national guideline used in treatment planning: NCCN Staging comments: Watchful waiting   06/20/2020 Initial Diagnosis   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)   Chronic lymphocytic leukemia (CLL), B-cell (HCC)  10/31/2015 Initial Diagnosis   Chronic lymphocytic leukemia (CLL), B-cell (HCC)   11/11/2015 Cancer Staging   Staging form: Chronic Lymphocytic Leukemia / Small Lymphocytic Lymphoma, AJCC 8th Edition - Clinical stage from 11/11/2015: Modified Rai Stage III (Modified Rai risk: High, Binet: Stage B, Lugano: Stage III, Lymphocytosis: Absent, Adenopathy: Present, Organomegaly: Absent, Anemia: Absent, Thrombocytopenia: Absent) - Signed by Cornelius Wanda DEL, MD on 04/05/2021 Histopathologic type: B-cell lymphocytic leukemia/small lymphocytic lymphoma (see also M-9670/3) Stage prefix: Initial diagnosis Stage used in treatment planning: Yes National guidelines used in treatment planning: Yes Type of national guideline used in treatment planning: NCCN       INTERVAL HISTORY:   Dawn Prince is here today for repeat clinical assessment.  Patient states that she feels *** and ***.    She has a WBC of ***, hemoglobin of ***, and platelet count of ***.   I will see her back in *** with ***.   She denies fever, chills, night sweats, or other signs of infection. She denies cardiorespiratory and gastrointestinal issues. She  denies pain. Her appetite is *** and Her weight {Weight change:10426}. Wt Readings from Last 3 Encounters:  01/07/24 110 lb (49.9 kg)  12/24/23 110 lb 8 oz (50.1 kg)  12/16/23 108 lb 6.4 oz (49.2 kg)   She had nausea, vomiting and diarrhea last week.  She states her nausea is better and she has not had continued vomiting since taking  compazine  daily.  The diarrhea resolved on it's own.  She reports fatigue in the afternoon. She continues to report intermittent night sweats, as well has hot flashes during the day.  She denies recurrent rashes.  She denies fevers or chills. She reports persistent left hip pain, for which Tylenol  is effective. Her appetite is good. Her weight has increased 2 pounds over last week.  She is planning a trip to the mountains for the next week.  Due for  REVIEW OF SYSTEMS:   Review of Systems  Constitutional:  Positive for diaphoresis (intermittent night sweats) and fatigue. Negative for appetite change, chills, fever and unexpected weight change.  HENT:   Negative for lump/mass, mouth sores and sore throat.   Respiratory:  Negative for cough and shortness of breath.   Cardiovascular:  Negative for chest pain and leg swelling.  Gastrointestinal:  Negative for abdominal pain, blood in stool, constipation, diarrhea, nausea and vomiting.  Endocrine: Positive for hot flashes.  Genitourinary:  Negative for difficulty urinating, dysuria, frequency, hematuria and vaginal bleeding.   Musculoskeletal:  Positive for arthralgias (left hip, chronic). Negative for back pain, gait problem, myalgias and neck pain.  Skin:  Negative for itching and rash.  Neurological:  Negative for dizziness, extremity weakness, gait problem, headaches, light-headedness and numbness.  Hematological:  Negative for adenopathy. Does not bruise/bleed easily.  Psychiatric/Behavioral:  Negative for depression and sleep disturbance.  The patient is not nervous/anxious.      VITALS:   There were no vitals taken for this visit.  Wt Readings from Last 3 Encounters:  01/07/24 110 lb (49.9 kg)  12/24/23 110 lb 8 oz (50.1 kg)  12/16/23 108 lb 6.4 oz (49.2 kg)    There is no height or weight on file to calculate BMI.  Performance status (ECOG): 1 - Symptomatic but completely ambulatory    PHYSICAL EXAM:   Physical Exam Vitals and  nursing note reviewed.  Constitutional:      General: She is not in acute distress.    Appearance: Normal appearance.  HENT:     Head: Normocephalic and atraumatic.     Mouth/Throat:     Mouth: Mucous membranes are moist.     Pharynx: Oropharynx is clear. No oropharyngeal exudate or posterior oropharyngeal erythema.  Eyes:     General: No scleral icterus.    Extraocular Movements: Extraocular movements intact.     Conjunctiva/sclera: Conjunctivae normal.     Pupils: Pupils are equal, round, and reactive to light.  Cardiovascular:     Rate and Rhythm: Normal rate and regular rhythm.     Heart sounds: Normal heart sounds. No murmur heard.    No friction rub. No gallop.  Pulmonary:     Effort: Pulmonary effort is normal.     Breath sounds: Normal breath sounds. No wheezing, rhonchi or rales.  Abdominal:     General: There is no distension.     Palpations: Abdomen is soft. There is no hepatomegaly, splenomegaly or mass.     Tenderness: There is no abdominal tenderness.  Musculoskeletal:        General: Normal range of motion.     Cervical back: Normal range of motion and neck supple. No tenderness.     Right lower leg: No edema.     Left lower leg: No edema.  Lymphadenopathy:     Cervical: Cervical adenopathy present.     Upper Body:     Right upper body: No supraclavicular or axillary adenopathy.     Left upper body: Supraclavicular adenopathy present. No axillary adenopathy.     Lower Body: No right inguinal adenopathy. Left inguinal adenopathy present.     Comments: She has persistent mild lymphadenopathy in the left neck and supraclavicular area, as well as left inguinal area  Skin:    General: Skin is warm and dry.     Coloration: Skin is not jaundiced.     Findings: No rash.  Neurological:     Mental Status: She is alert and oriented to person, place, and time.     Cranial Nerves: No cranial nerve deficit.  Psychiatric:        Mood and Affect: Mood normal.         Behavior: Behavior normal.        Thought Content: Thought content normal.     LABS:      Latest Ref Rng & Units 01/05/2024    8:28 AM 12/24/2023   12:55 PM 12/16/2023    9:13 AM  CBC  WBC 4.0 - 10.5 K/uL 1.9  3.6  2.9   Hemoglobin 12.0 - 15.0 g/dL 86.6  86.5  86.9   Hematocrit 36.0 - 46.0 % 39.9  40.4  39.0   Platelets 150 - 400 K/uL 167  175  152     Latest Reference Range & Units 12/24/23 12:55  Neutrophils % 86  Lymphocytes % 5  Monocytes  Relative % 8  Eosinophil % 0  Basophil % 0  Immature Granulocytes % 1  NEUT# 1.7 - 7.7 K/uL 3.1  Lymphs Abs 0.7 - 4.0 K/uL 0.2 (L)  Monocyte # 0.1 - 1.0 K/uL 0.3  Eosinophils Absolute 0.0 - 0.5 K/uL 0.0  Basophils Absolute 0.0 - 0.1 K/uL 0.0  Abs Immature Granulocytes 0.00 - 0.07 K/uL 0.02  (L): Data is abnormally low     Latest Ref Rng & Units 01/05/2024    8:28 AM 12/24/2023   12:55 PM 12/16/2023    9:13 AM  CMP  Glucose 70 - 99 mg/dL 879  91  99   BUN 8 - 23 mg/dL 15  11  14    Creatinine 0.44 - 1.00 mg/dL 9.31  9.23  9.24   Sodium 135 - 145 mmol/L 137  137  135   Potassium 3.5 - 5.1 mmol/L 4.0  3.8  4.2   Chloride 98 - 111 mmol/L 99  99  100   CO2 22 - 32 mmol/L 25  27  25    Calcium  8.9 - 10.3 mg/dL 9.5  9.6  9.6   Total Protein 6.5 - 8.1 g/dL 7.3  7.2  7.2   Total Bilirubin 0.0 - 1.2 mg/dL 0.5  0.3  0.5   Alkaline Phos 38 - 126 U/L 71  90  76   AST 15 - 41 U/L 36  41  33   ALT 0 - 44 U/L 46  67  49     Lab Results  Component Value Date   TIBC 322 12/12/2022   TIBC 331 07/23/2021   FERRITIN 19 12/12/2022   FERRITIN 22 07/23/2021   IRONPCTSAT 30 12/12/2022   IRONPCTSAT 20 07/23/2021   Lab Results  Component Value Date   LDH 163 10/15/2023   LDH 162 07/01/2023   LDH 136 10/30/2021    STUDIES:   No results found.    HISTORY:   Past Medical History:  Diagnosis Date   Allergy    seasonal   Arthritis    Bipolar 1 disorder (HCC)    Cancer (HCC)    non hodgkins lymphoma   Depression    GERD  (gastroesophageal reflux disease)    History of degenerative disc disease    Hyperlipidemia    Increased risk of breast cancer 04/25/2021   Obstructive sleep apnea    Osteopenia    Scoliosis    Thyroid  disease    hypothyroidism   Urticaria    Vitamin D deficiency     Past Surgical History:  Procedure Laterality Date   BREAST BIOPSY     BUBBLE STUDY  02/13/2021   Procedure: BUBBLE STUDY;  Surgeon: Raford Riggs, MD;  Location: [facility] ENDOSCOPY;  Service: Cardiovascular;;   CARPAL TUNNEL RELEASE Bilateral 2002   CESAREAN SECTION     x2   COLONOSCOPY  07/26/2008   Melanosis coli. Small internal hemorrhoids.    ENDOSCOPIC PLANTAR FASCIOTOMY     ESOPHAGOGASTRODUODENOSCOPY  03/17/2013   Mild gastritis. Status post esophageal dilatation.   LYMPH NODE BIOPSY     right hip repair torn tendon Right 09/2021   TEE WITHOUT CARDIOVERSION N/A 02/13/2021   Procedure: TRANSESOPHAGEAL ECHOCARDIOGRAM (TEE);  Surgeon: Raford Riggs, MD;  Location: [facility] ENDOSCOPY;  Service: Cardiovascular;  Laterality: N/A;   WISDOM TOOTH EXTRACTION      Family History  Problem Relation Age of Onset   Prostate cancer Father 41   Melanoma Father 79   High blood pressure Father  Breast cancer Maternal Aunt    Multiple myeloma Maternal Aunt    Breast cancer Maternal Aunt    Colon cancer Neg Hx    Colon polyps Neg Hx    Esophageal cancer Neg Hx    Rectal cancer Neg Hx    Stomach cancer Neg Hx     Social History:  reports that she has never smoked. She has never used smokeless tobacco. She reports that she does not currently use alcohol. She reports that she does not use drugs.The patient is accompanied by husband today.  Allergies:  Allergies  Allergen Reactions   Diclofenac Sodium Other (See Comments)    Thought it had something to do with her having a stroke   Voltaren [Diclofenac Sodium]     Thought it had something to do with her having a stroke   Nsaids Other (See Comments)    Had a stroke  and worries it was related to the Diclofenac she took.    Current Medications: Current Outpatient Medications  Medication Sig Dispense Refill   amitriptyline  (ELAVIL ) 10 MG tablet Take 1 tablet (10 mg total) by mouth at bedtime. 30 tablet 11   Ascorbic Acid (VITAMIN C) 1000 MG tablet Take 1,000 mg by mouth 2 (two) times daily.     atorvastatin  (LIPITOR) 80 MG tablet Take 1 tablet (80 mg total) by mouth daily. 30 tablet 5   estradiol (ESTRACE) 0.1 MG/GM vaginal cream 2 (two) times a week.     FLUoxetine  HCl (PROZAC  PO) Take 60 mg by mouth daily.     Multiple Vitamin (MULTIVITAMIN ADULT PO) Take by mouth. Vision MD once a day     ondansetron  (ZOFRAN -ODT) 4 MG disintegrating tablet Take 1 tablet (4 mg total) by mouth every 8 (eight) hours as needed for nausea or vomiting. 20 tablet 2   PLAVIX  75 MG tablet Take 75 mg by mouth daily.     Probiotic Product (PROBIOTIC BLEND PO) Take by mouth at bedtime.     prochlorperazine  (COMPAZINE ) 10 MG tablet Take 1 tablet (10 mg total) by mouth every 6 (six) hours as needed for nausea or vomiting. 30 tablet 5   ramipril  (ALTACE ) 2.5 MG capsule Take by mouth. 1 in am in 1 in pm     venetoclax  (VENCLEXTA ) 100 MG tablet Take 2 tablets (200 mg total) by mouth daily. Tablets should be swallowed whole with a meal and a full glass of water. 60 tablet 5   No current facility-administered medications for this visit.     I,Yazmyn M Lowe,acting as a Neurosurgeon for [name], MD.,have documented all relevant documentation on the behalf of [name], MD,as directed by  [name], MD while in the presence of [name], MD.

## 2024-01-09 ENCOUNTER — Other Ambulatory Visit

## 2024-01-09 ENCOUNTER — Inpatient Hospital Stay: Admitting: Oncology

## 2024-01-12 DIAGNOSIS — M5432 Sciatica, left side: Secondary | ICD-10-CM | POA: Diagnosis not present

## 2024-01-12 DIAGNOSIS — M41126 Adolescent idiopathic scoliosis, lumbar region: Secondary | ICD-10-CM | POA: Diagnosis not present

## 2024-01-16 DIAGNOSIS — M5416 Radiculopathy, lumbar region: Secondary | ICD-10-CM | POA: Diagnosis not present

## 2024-01-16 DIAGNOSIS — M419 Scoliosis, unspecified: Secondary | ICD-10-CM | POA: Diagnosis not present

## 2024-01-23 ENCOUNTER — Inpatient Hospital Stay (HOSPITAL_BASED_OUTPATIENT_CLINIC_OR_DEPARTMENT_OTHER): Admitting: Hematology and Oncology

## 2024-01-23 ENCOUNTER — Inpatient Hospital Stay

## 2024-01-23 ENCOUNTER — Encounter: Payer: Self-pay | Admitting: Hematology and Oncology

## 2024-01-23 ENCOUNTER — Other Ambulatory Visit: Payer: Self-pay

## 2024-01-23 ENCOUNTER — Telehealth: Payer: Self-pay | Admitting: Hematology and Oncology

## 2024-01-23 ENCOUNTER — Encounter (INDEPENDENT_AMBULATORY_CARE_PROVIDER_SITE_OTHER): Payer: Self-pay

## 2024-01-23 VITALS — BP 131/84 | HR 76 | Temp 98.7°F | Resp 18 | Ht 60.0 in | Wt 113.3 lb

## 2024-01-23 DIAGNOSIS — C9110 Chronic lymphocytic leukemia of B-cell type not having achieved remission: Secondary | ICD-10-CM | POA: Diagnosis not present

## 2024-01-23 DIAGNOSIS — R748 Abnormal levels of other serum enzymes: Secondary | ICD-10-CM | POA: Diagnosis not present

## 2024-01-23 DIAGNOSIS — Z7969 Long term (current) use of other immunomodulators and immunosuppressants: Secondary | ICD-10-CM | POA: Diagnosis not present

## 2024-01-23 DIAGNOSIS — N6325 Unspecified lump in the left breast, overlapping quadrants: Secondary | ICD-10-CM

## 2024-01-23 DIAGNOSIS — R7401 Elevation of levels of liver transaminase levels: Secondary | ICD-10-CM | POA: Diagnosis not present

## 2024-01-23 DIAGNOSIS — Z7902 Long term (current) use of antithrombotics/antiplatelets: Secondary | ICD-10-CM | POA: Diagnosis not present

## 2024-01-23 DIAGNOSIS — Z79899 Other long term (current) drug therapy: Secondary | ICD-10-CM | POA: Diagnosis not present

## 2024-01-23 DIAGNOSIS — C8302 Small cell B-cell lymphoma, intrathoracic lymph nodes: Secondary | ICD-10-CM

## 2024-01-23 DIAGNOSIS — N6315 Unspecified lump in the right breast, overlapping quadrants: Secondary | ICD-10-CM | POA: Diagnosis not present

## 2024-01-23 DIAGNOSIS — Z803 Family history of malignant neoplasm of breast: Secondary | ICD-10-CM | POA: Diagnosis not present

## 2024-01-23 DIAGNOSIS — Z808 Family history of malignant neoplasm of other organs or systems: Secondary | ICD-10-CM | POA: Diagnosis not present

## 2024-01-23 DIAGNOSIS — M8580 Other specified disorders of bone density and structure, unspecified site: Secondary | ICD-10-CM | POA: Diagnosis not present

## 2024-01-23 DIAGNOSIS — N63 Unspecified lump in unspecified breast: Secondary | ICD-10-CM

## 2024-01-23 DIAGNOSIS — Z807 Family history of other malignant neoplasms of lymphoid, hematopoietic and related tissues: Secondary | ICD-10-CM | POA: Diagnosis not present

## 2024-01-23 LAB — CBC WITH DIFFERENTIAL (CANCER CENTER ONLY)
Abs Immature Granulocytes: 0.01 K/uL (ref 0.00–0.07)
Basophils Absolute: 0 K/uL (ref 0.0–0.1)
Basophils Relative: 0 %
Eosinophils Absolute: 0 K/uL (ref 0.0–0.5)
Eosinophils Relative: 0 %
HCT: 40.8 % (ref 36.0–46.0)
Hemoglobin: 13.7 g/dL (ref 12.0–15.0)
Immature Granulocytes: 0 %
Lymphocytes Relative: 6 %
Lymphs Abs: 0.2 K/uL — ABNORMAL LOW (ref 0.7–4.0)
MCH: 33.8 pg (ref 26.0–34.0)
MCHC: 33.6 g/dL (ref 30.0–36.0)
MCV: 100.7 fL — ABNORMAL HIGH (ref 80.0–100.0)
Monocytes Absolute: 0.3 K/uL (ref 0.1–1.0)
Monocytes Relative: 12 %
Neutro Abs: 2.3 K/uL (ref 1.7–7.7)
Neutrophils Relative %: 82 %
Platelet Count: 171 K/uL (ref 150–400)
RBC: 4.05 MIL/uL (ref 3.87–5.11)
RDW: 12.5 % (ref 11.5–15.5)
WBC Count: 2.8 K/uL — ABNORMAL LOW (ref 4.0–10.5)
nRBC: 0 % (ref 0.0–0.2)

## 2024-01-23 LAB — CMP (CANCER CENTER ONLY)
ALT: 176 U/L — ABNORMAL HIGH (ref 0–44)
AST: 76 U/L — ABNORMAL HIGH (ref 15–41)
Albumin: 4.3 g/dL (ref 3.5–5.0)
Alkaline Phosphatase: 88 U/L (ref 38–126)
Anion gap: 11 (ref 5–15)
BUN: 17 mg/dL (ref 8–23)
CO2: 26 mmol/L (ref 22–32)
Calcium: 9.5 mg/dL (ref 8.9–10.3)
Chloride: 98 mmol/L (ref 98–111)
Creatinine: 0.75 mg/dL (ref 0.44–1.00)
GFR, Estimated: >60 mL/min (ref >60)
Glucose, Bld: 87 mg/dL (ref 70–99)
Potassium: 4.2 mmol/L (ref 3.5–5.1)
Sodium: 136 mmol/L (ref 135–145)
Total Bilirubin: 0.4 mg/dL (ref 0.0–1.2)
Total Protein: 7 g/dL (ref 6.5–8.1)

## 2024-01-23 NOTE — Assessment & Plan Note (Signed)
 New bilateral breast soft tissue masses right greater than left.  I will obtain bilateral diagnostic mammogram and breast ultrasounds for further evaluation.

## 2024-01-23 NOTE — Progress Notes (Signed)
 [facility]  [address] 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  She had been on observation alone.  She has progression of her lymphadenopathy by exam and by CT scan in May.  She has been taking oral chemotherapy with venetoclax  200 mg daily since June 16, after a slow titration. She initially had significant neutropenia felt to be due to venetoclax  the WBCs have fluctuated up and down somewhat.  She has had a good response to treatment with decreasing lymphadenopathy and normalization of her hemoglobin and platelets.  She has had and increase in the transaminases, which have fluctuated up and down.    She is experiencing severe fatigue, but otherwise tolerating venetoclax  well.  She has had a significant increase in her transaminases, with grade 2 elevation of ALT.  This is likely multifactorial.  As her lipid panel was normal, I recommended she go ahead and decrease the atorvastatin  to 40 mg daily.  We will continue venetoclax  200 mg daily, however, we will need to monitor her more closely again.  We will plan to see her back in 1 week with a CBC, comprehensive metabolic panel, phosphorus and uric acid.  Abnormal transaminases Mild elevation of the transaminases.  Dr. Cornelius recommended she decrease atorvastatin  to 40 mg daily, but that was not done, as a lipid panel was requested.  This was normal.  I reached out to her neurology provider, but advised the patient to decrease the atorvastatin  to 40 mg daily beginning tomorrow.  Breast mass in female New bilateral breast soft tissue masses right greater than left.  I will obtain bilateral diagnostic mammogram and breast ultrasounds for further evaluation.    The patient understands the  plans discussed today and is in agreement with them.  She knows to contact our office if she develops concerns prior to her next appointment.   I provided 40 minutes of face-to-face time during this encounter and > 50% was spent counseling as documented under my assessment and plan.    [name] [name], PA-C  [facility] A DEPT OF [facility] [address] [place] [address] Dept: [phone] Dept Fax: [phone]   Orders Placed This Encounter  Procedures   MM DIAG BREAST TOMO BILATERAL    [facility] PF; : 1/27/2025Department: [facility] // NO need- yes issues- no implants- no reductions-spoke with pt/ fj  History of lymphoma on oral chemotherapy    Standing Status:   Future    Expected Date:   01/27/2024    Expiration Date:   01/22/2025    Reason for Exam (SYMPTOM  OR DIAGNOSIS REQUIRED):   bilateral breast masses, lateral breast R>L    Preferred [facility]  LIMITED ULTRASOUND INCLUDING AXILLA LEFT BREAST     History of lymphoma on oral chemotherapy    Standing Status:   Future    Expected Date:   01/27/2024    Expiration Date:   01/22/2025    Reason for Exam (SYMPTOM  OR DIAGNOSIS REQUIRED):   mass lateral left breast    Preferred Imaging Location?:   [facility]   [place]  LIMITED ULTRASOUND INCLUDING AXILLA RIGHT BREAST  History of lymphoma, on oral chemotherapy    Standing Status:   Future    Expected Date:   01/27/2024    Expiration Date:   01/22/2025    Reason for Exam (SYMPTOM  OR DIAGNOSIS REQUIRED):   mass right lateral breast    Preferred Imaging Location?:   [facility]      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  She had been on observation only. She has had  stable bilateral cervical and inguinal lymphadenopathy.  MRI imaging from February 2023 revealed extensive inguinal and retroperitoneal lymphadenopathy.  Her physical exam reveals a modest change in her cervical and supraclavicular adenopathy. PET scan in March 2023 revealed continued stability of mild adenopathy in the neck, chest, abdomen and pelvis with low level FDG and Deauville 2-3 category uptake. CT imaging in September 2023 shows the numerous mildly enlarged nodes to be unchanged. Her lymphadenopathy is relatively stable at this time but just mildly increased. CT chest, abdomen and pelvis in June 2024 revealed stable bilateral axillary and supraclavicular adenopathy, no mediastinal lymphadenopathy, with a new band of linear consolidation in the left upper lobe, felt to be post infectious consolidation. She has stable retroperitoneal and proximal iliac lymphadenopathy, and no new adenopathy in the abdomen and pelvis.  Spleen was normal and no skeletal lesions.  She had progressive lymphadenopathy as well as mild anemia and thrombocytopenia in May, so was started on venetoclax  in June.  Venetoclax  was slowly titrated beginning with 20 mg daily for 1 week, 50 mg daily for 1 week, 100 mg daily for 1 week, and then 200 mg daily.  She has tolerated this fairly well with improvement in her lymphadenopathy, anemia and thrombocytopenia.  She has experienced severe fatigue and moderate nausea.   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)  11/11/2015 Cancer Staging   Staging form: Hodgkin and Non-Hodgkin Lymphoma, AJCC 8th Edition - Clinical stage from 11/11/2015: Stage III (Small lymphocytic leukemia) - Signed by Cornelius Wanda DEL, MD on 01/28/2021 Histopathologic type: Malignant lymphoma, small B lymphocytic, NOS (see also M-9823/3) Stage prefix: Initial diagnosis Diagnostic confirmation: Positive histology PLUS positive immunophenotyping and/or positive genetic  studies Specimen type: Core Needle Biopsy Staged by: Managing physician Stage used in treatment planning: Yes National guidelines used in treatment planning: Yes Type of national guideline used in treatment planning: NCCN Staging comments: Watchful waiting   06/20/2020 Initial Diagnosis   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)   Chronic lymphocytic leukemia (CLL), B-cell (HCC)  10/31/2015 Initial Diagnosis   Chronic lymphocytic leukemia (CLL), B-cell (HCC)   11/11/2015 Cancer Staging   Staging form: Chronic Lymphocytic Leukemia / Small Lymphocytic Lymphoma, AJCC 8th Edition - Clinical stage from 11/11/2015: Modified Rai Stage III (Modified Rai risk: High, Binet: Stage B, Lugano: Stage III, Lymphocytosis: Absent, Adenopathy: Present, Organomegaly: Absent, Anemia: Absent, Thrombocytopenia: Absent) - Signed by Cornelius Wanda DEL, MD on 04/05/2021 Histopathologic type: B-cell lymphocytic leukemia/small lymphocytic lymphoma (see also M-9670/3) Stage prefix: Initial diagnosis Stage used in treatment planning: Yes National guidelines used in treatment planning: Yes Type of national guideline used in treatment planning: NCCN       INTERVAL HISTORY:   Anquinette is here today for repeat clinical assessment.  She states she continues to venetoclax  200 mg daily.  She states her nausea is well-controlled with prochlorperazine  in the morning.  Unfortunately, she continues to have severe fatigue, which is most likely secondary to venetoclax .  She states she is limited in activity due to the fatigue. She reports shortness of breath with exertion.   She denies fevers or chills. She reports severe lower back pain due to degenerative disease.  She was started on Lyrica last week.  She has had trouble sleeping, this is improved with Lyrica.  Her blood pressure has been higher than normal, so she was started on hydrochlorothiazide this week.  She states she has not decreased her  atorvastatin  dose, as her neurologist was awaiting a repeat lipid panel.  This was done last month and was normal, but the patient states she was not notified whether to decrease the atorvastatin  dose or not.  Her appetite is good. Her weight has increased 3 pounds over last month. She reports new thickening of the lateral aspect of both breasts.  Bilateral screening mammogram in January was negative.  She states that she was scheduled to see Dr. Jefferey yesterday, but the office had to reschedule in 2 weeks.  REVIEW OF SYSTEMS:   Review of Systems  Constitutional:  Positive for fatigue. Negative for appetite change, chills, fever and unexpected weight change.  HENT:   Negative for lump/mass, mouth sores and sore throat.   Respiratory:  Positive for shortness of breath (with exertion). Negative for cough.   Cardiovascular:  Negative for chest pain and leg swelling.  Gastrointestinal:  Negative for abdominal pain, constipation, diarrhea, nausea and vomiting.  Genitourinary:  Negative for difficulty urinating, dysuria, frequency and hematuria.   Musculoskeletal:  Positive for back pain (lower). Negative for arthralgias, gait problem, myalgias and neck pain.  Skin:  Negative for rash.  Neurological:  Negative for dizziness, extremity weakness, gait problem, headaches, light-headedness and numbness.  Hematological:  Negative for adenopathy. Does not bruise/bleed easily.  Psychiatric/Behavioral:  Negative for depression and sleep disturbance. The patient is not nervous/anxious.      VITALS:   Blood pressure 131/84, pulse 76, temperature 98.7 F (37.1 C), temperature source Oral, resp. rate 18, height 5' (1.524 m), weight 113 lb 4.8 oz (51.4 kg), SpO2 100%.  Wt Readings from Last 3 Encounters:  01/23/24 113 lb 4.8 oz (51.4 kg)  01/07/24 110 lb (49.9 kg)  12/24/23 110 lb 8 oz (50.1 kg)    Body mass index is 22.13 kg/m.  Performance status (ECOG): 1 - Symptomatic but completely  ambulatory    PHYSICAL EXAM:   Physical Exam Vitals and nursing note reviewed.  Constitutional:      General: She is not in acute distress.    Appearance: Normal appearance.  HENT:     Head: Normocephalic and atraumatic.     Mouth/Throat:     Mouth: Mucous membranes are moist.     Pharynx: Oropharynx is clear. No oropharyngeal exudate or posterior oropharyngeal erythema.  Eyes:     General: No scleral icterus.    Extraocular Movements: Extraocular movements intact.     Conjunctiva/sclera: Conjunctivae normal.     Pupils: Pupils are equal, round, and reactive to light.  Cardiovascular:     Rate and Rhythm: Normal rate and regular rhythm.     Heart sounds: Normal heart sounds. No murmur heard.    No friction rub. No gallop.  Pulmonary:     Effort: Pulmonary effort is normal.     Breath sounds: Normal breath sounds. No wheezing, rhonchi or rales.  Abdominal:     General: There is no distension.     Palpations: Abdomen is soft. There is no hepatomegaly, splenomegaly or mass.     Tenderness: There is no abdominal tenderness.  Musculoskeletal:        General: Normal range of motion.     Cervical back: Normal range of motion and neck supple. No tenderness.     Right lower leg: No edema.     Left lower leg: No edema.  Lymphadenopathy:     Cervical: No cervical adenopathy.     Upper Body:     Right upper body: No supraclavicular or axillary adenopathy.     Left upper body: No supraclavicular or axillary adenopathy.     Lower Body: No right inguinal adenopathy. Left inguinal adenopathy (barely palpable tiny node in left groin) present.  Skin:    General: Skin is warm and dry.     Coloration: Skin is not jaundiced.     Findings: No rash.  Neurological:     Mental Status: She is alert and oriented to person, place, and time.     Cranial Nerves: No cranial nerve deficit.  Psychiatric:        Mood and Affect: Mood normal.        Behavior: Behavior normal.        Thought  Content: Thought content normal.     LABS:      Latest Ref Rng & Units 01/23/2024    8:34 AM 01/05/2024    8:28 AM 12/24/2023   12:55 PM  CBC  WBC 4.0 - 10.5 K/uL 2.8  1.9  3.6   Hemoglobin 12.0 - 15.0 g/dL 86.2  86.6  86.5   Hematocrit 36.0 - 46.0 % 40.8  39.9  40.4   Platelets 150 - 400 K/uL 171  167  175       Latest Ref Rng & Units 01/23/2024    8:34 AM 01/05/2024    8:28 AM 12/24/2023   12:55 PM  CMP  Glucose 70 - 99 mg/dL 87  879  91   BUN 8 - 23 mg/dL 17  15  11    Creatinine 0.44 - 1.00 mg/dL 9.24  9.31  9.23   Sodium 135 - 145 mmol/L 136  137  137   Potassium 3.5 - 5.1 mmol/L 4.2  4.0  3.8   Chloride 98 - 111 mmol/L 98  99  99   CO2 22 - 32 mmol/L 26  25  27    Calcium  8.9 - 10.3 mg/dL 9.5  9.5  9.6   Total Protein 6.5 - 8.1 g/dL 7.0  7.3  7.2   Total Bilirubin 0.0 - 1.2 mg/dL 0.4  0.5  0.3   Alkaline Phos 38 - 126 U/L 88  71  90   AST 15 - 41 U/L 76  36  41   ALT 0 - 44 U/L 176  46  67     Lab Results  Component Value Date   TIBC 322 12/12/2022   TIBC 331 07/23/2021   FERRITIN 19 12/12/2022   FERRITIN 22 07/23/2021   IRONPCTSAT 30 12/12/2022   IRONPCTSAT 20 07/23/2021   Lab Results  Component Value Date   LDH 163 10/15/2023   LDH 162 07/01/2023   LDH 136 10/30/2021    STUDIES:   No results found.    HISTORY:   Past Medical History:  Diagnosis Date   Allergy    seasonal   Arthritis  Bipolar 1 disorder (HCC)    Cancer (HCC)    non hodgkins lymphoma   Depression    GERD (gastroesophageal reflux disease)    History of degenerative disc disease    Hyperlipidemia    Increased risk of breast cancer 04/25/2021   Obstructive sleep apnea    Osteopenia    Scoliosis    Thyroid  disease    hypothyroidism   Urticaria    Vitamin D deficiency     Past Surgical History:  Procedure Laterality Date   BREAST BIOPSY     BUBBLE STUDY  02/13/2021   Procedure: BUBBLE STUDY;  Surgeon: Raford Riggs, MD;  Location: [facility] ENDOSCOPY;  Service:  Cardiovascular;;   CARPAL TUNNEL RELEASE Bilateral 2002   CESAREAN SECTION     x2   COLONOSCOPY  07/26/2008   Melanosis coli. Small internal hemorrhoids.    ENDOSCOPIC PLANTAR FASCIOTOMY     ESOPHAGOGASTRODUODENOSCOPY  03/17/2013   Mild gastritis. Status post esophageal dilatation.   LYMPH NODE BIOPSY     right hip repair torn tendon Right 09/2021   TEE WITHOUT CARDIOVERSION N/A 02/13/2021   Procedure: TRANSESOPHAGEAL ECHOCARDIOGRAM (TEE);  Surgeon: Raford Riggs, MD;  Location: [facility] ENDOSCOPY;  Service: Cardiovascular;  Laterality: N/A;   WISDOM TOOTH EXTRACTION      Family History  Problem Relation Age of Onset   Prostate cancer Father 82   Melanoma Father 62   High blood pressure Father    Breast cancer Maternal Aunt    Multiple myeloma Maternal Aunt    Breast cancer Maternal Aunt    Colon cancer Neg Hx    Colon polyps Neg Hx    Esophageal cancer Neg Hx    Rectal cancer Neg Hx    Stomach cancer Neg Hx     Social History:  reports that she has never smoked. She has never used smokeless tobacco. She reports that she does not currently use alcohol. She reports that she does not use drugs.The patient is accompanied by her husband today.  Allergies:  Allergies  Allergen Reactions   Diclofenac Sodium Other (See Comments)    Thought it had something to do with her having a stroke   Voltaren [Diclofenac Sodium]     Thought it had something to do with her having a stroke   Nsaids Other (See Comments)    Had a stroke and worries it was related to the Diclofenac she took.    Current Medications: Current Outpatient Medications  Medication Sig Dispense Refill   hydrochlorothiazide (HYDRODIURIL) 12.5 MG tablet Take 12.5 mg by mouth every morning.     pregabalin (LYRICA) 75 MG capsule Take 75 mg by mouth 2 (two) times daily. (Patient taking differently: Take 75 mg by mouth 2 (two) times daily.)     amitriptyline  (ELAVIL ) 10 MG tablet Take 1 tablet (10 mg total) by mouth at  bedtime. (Patient not taking: Reported on 01/23/2024) 30 tablet 11   Ascorbic Acid (VITAMIN C) 1000 MG tablet Take 1,000 mg by mouth 2 (two) times daily.     atorvastatin  (LIPITOR) 80 MG tablet Take 1 tablet (80 mg total) by mouth daily. 30 tablet 5   estradiol (ESTRACE) 0.1 MG/GM vaginal cream 2 (two) times a week.     FLUoxetine  HCl (PROZAC  PO) Take 60 mg by mouth daily.     Multiple Vitamin (MULTIVITAMIN ADULT PO) Take by mouth. Vision MD once a day     ondansetron  (ZOFRAN -ODT) 4 MG disintegrating tablet Take 1 tablet (4 mg total)  by mouth every 8 (eight) hours as needed for nausea or vomiting. 20 tablet 2   PLAVIX  75 MG tablet Take 75 mg by mouth daily.     Probiotic Product (PROBIOTIC BLEND PO) Take by mouth at bedtime.     prochlorperazine  (COMPAZINE ) 10 MG tablet Take 1 tablet (10 mg total) by mouth every 6 (six) hours as needed for nausea or vomiting. 30 tablet 5   ramipril  (ALTACE ) 2.5 MG capsule Take by mouth. 1 in am in 1 in pm     venetoclax  (VENCLEXTA ) 100 MG tablet Take 2 tablets (200 mg total) by mouth daily. Tablets should be swallowed whole with a meal and a full glass of water. 60 tablet 5   No current facility-administered medications for this visit.

## 2024-01-23 NOTE — Progress Notes (Signed)
 Specialty Pharmacy Refill Coordination Note  Alecia Doi is a 63 y.o. female contacted today regarding refills of specialty medication(s) Venetoclax  (VENCLEXTA )   Patient requested (Patient-Rptd) Delivery   Delivery date: 01/30/24   Verified address: (Patient-Rptd) [address] [place], [place]. [address]   Medication will be filled on 01/29/24.

## 2024-01-23 NOTE — Assessment & Plan Note (Addendum)
 Worsening elevation of the transaminases.  This is likely multifactorial due to venetoclax  and atorvastatin . Elevated transaminases are rarely associated with Lyrica and hydrochlorothiazide.   Dr. Cornelius recommended she decrease atorvastatin  to 40 mg daily, but that was not done, as a lipid panel was requested.  The lipid panel was normal was normal.  I reached out to her neurology provider, but advised the patient to decrease the atorvastatin  to 40 mg daily beginning tomorrow.

## 2024-01-23 NOTE — Assessment & Plan Note (Addendum)
 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  She had been on observation alone.  She has progression of her lymphadenopathy by exam and by CT scan in May.  She has been taking oral chemotherapy with venetoclax  200 mg daily since June 16, after a slow titration. She initially had significant neutropenia felt to be due to venetoclax  the WBCs have fluctuated up and down somewhat.  She has had a good response to treatment with decreasing lymphadenopathy and normalization of her hemoglobin and platelets.  She has had and increase in the transaminases, which have fluctuated up and down.    She is experiencing severe fatigue, but otherwise tolerating venetoclax  well.  She has had a significant increase in her transaminases, with grade 2 elevation of ALT.  This is likely multifactorial due to venetoclax  and atorvastatin .  Elevated transaminases are rarely associated with Lyrica and hydrochlorothiazide.  I discussed her case with our clinical pharmacist, and as the elevation of ALT is only grade 2, we will be not have to hold venetoclax .  However, as her lipid panel was normal, I recommended the patient go ahead and decrease the atorvastatin  to 40 mg daily.  We will continue venetoclax  200 mg daily, however, we will need to monitor her more closely again.  We will plan to see her back in 1 week with a CBC, comprehensive metabolic panel, phosphorus and uric acid.

## 2024-01-23 NOTE — Telephone Encounter (Signed)
 Patient has been scheduled for follow-up visit per 01/23/24 LOS.  Pt noted appt details on personal electronic device.

## 2024-01-27 ENCOUNTER — Other Ambulatory Visit: Payer: Self-pay | Admitting: Medical Genetics

## 2024-01-27 ENCOUNTER — Other Ambulatory Visit: Payer: Self-pay

## 2024-01-27 NOTE — Progress Notes (Signed)
 Specialty Pharmacy Ongoing Clinical Assessment Note  Dawn Prince is a 63 y.o. female who is being followed by the specialty pharmacy service for RxSp Oncology   Patient's specialty medication(s) reviewed today: Venetoclax  (VENCLEXTA )   Missed doses in the last 4 weeks: 0   Patient/Caregiver did not have any additional questions or concerns.   Therapeutic benefit summary: Patient is achieving benefit   Adverse events/side effects summary: Experienced adverse events/side effects (nausea (well controlled with compazine ) and tolerable fatigue)   Patient's therapy is appropriate to: Continue    Goals Addressed             This Visit's Progress    Maintain optimal adherence to therapy   On track    Patient is initiating therapy. Patient will maintain adherence         Follow up: 3 months  Silvano LOISE Dolly Specialty Pharmacist   Clinical Intervention Note  Clinical Intervention Notes: Patient started Lyrica, no DDIs identified.   Clinical Intervention Outcomes: Prevention of an adverse drug event   Silvano LOISE Dolly Karel Santa

## 2024-01-29 ENCOUNTER — Other Ambulatory Visit: Payer: Self-pay

## 2024-01-30 ENCOUNTER — Other Ambulatory Visit: Payer: Self-pay

## 2024-01-30 ENCOUNTER — Inpatient Hospital Stay (HOSPITAL_BASED_OUTPATIENT_CLINIC_OR_DEPARTMENT_OTHER): Admitting: Hematology and Oncology

## 2024-01-30 ENCOUNTER — Inpatient Hospital Stay: Admitting: Hematology and Oncology

## 2024-01-30 ENCOUNTER — Inpatient Hospital Stay: Attending: Hematology and Oncology

## 2024-01-30 DIAGNOSIS — R7401 Elevation of levels of liver transaminase levels: Secondary | ICD-10-CM | POA: Insufficient documentation

## 2024-01-30 DIAGNOSIS — M549 Dorsalgia, unspecified: Secondary | ICD-10-CM | POA: Diagnosis not present

## 2024-01-30 DIAGNOSIS — Z8249 Family history of ischemic heart disease and other diseases of the circulatory system: Secondary | ICD-10-CM | POA: Insufficient documentation

## 2024-01-30 DIAGNOSIS — D6959 Other secondary thrombocytopenia: Secondary | ICD-10-CM | POA: Insufficient documentation

## 2024-01-30 DIAGNOSIS — E785 Hyperlipidemia, unspecified: Secondary | ICD-10-CM | POA: Diagnosis not present

## 2024-01-30 DIAGNOSIS — R748 Abnormal levels of other serum enzymes: Secondary | ICD-10-CM

## 2024-01-30 DIAGNOSIS — Z807 Family history of other malignant neoplasms of lymphoid, hematopoietic and related tissues: Secondary | ICD-10-CM | POA: Diagnosis not present

## 2024-01-30 DIAGNOSIS — Z886 Allergy status to analgesic agent status: Secondary | ICD-10-CM | POA: Insufficient documentation

## 2024-01-30 DIAGNOSIS — M545 Low back pain, unspecified: Secondary | ICD-10-CM | POA: Insufficient documentation

## 2024-01-30 DIAGNOSIS — E039 Hypothyroidism, unspecified: Secondary | ICD-10-CM | POA: Insufficient documentation

## 2024-01-30 DIAGNOSIS — N6320 Unspecified lump in the left breast, unspecified quadrant: Secondary | ICD-10-CM | POA: Diagnosis not present

## 2024-01-30 DIAGNOSIS — D649 Anemia, unspecified: Secondary | ICD-10-CM | POA: Insufficient documentation

## 2024-01-30 DIAGNOSIS — C8302 Small cell B-cell lymphoma, intrathoracic lymph nodes: Secondary | ICD-10-CM | POA: Insufficient documentation

## 2024-01-30 DIAGNOSIS — M8580 Other specified disorders of bone density and structure, unspecified site: Secondary | ICD-10-CM | POA: Diagnosis not present

## 2024-01-30 DIAGNOSIS — Z803 Family history of malignant neoplasm of breast: Secondary | ICD-10-CM | POA: Diagnosis not present

## 2024-01-30 DIAGNOSIS — R5383 Other fatigue: Secondary | ICD-10-CM | POA: Diagnosis not present

## 2024-01-30 DIAGNOSIS — Z808 Family history of malignant neoplasm of other organs or systems: Secondary | ICD-10-CM | POA: Insufficient documentation

## 2024-01-30 DIAGNOSIS — Z8719 Personal history of other diseases of the digestive system: Secondary | ICD-10-CM | POA: Diagnosis not present

## 2024-01-30 DIAGNOSIS — I2510 Atherosclerotic heart disease of native coronary artery without angina pectoris: Secondary | ICD-10-CM | POA: Insufficient documentation

## 2024-01-30 DIAGNOSIS — Z79899 Other long term (current) drug therapy: Secondary | ICD-10-CM | POA: Insufficient documentation

## 2024-01-30 DIAGNOSIS — R110 Nausea: Secondary | ICD-10-CM | POA: Insufficient documentation

## 2024-01-30 DIAGNOSIS — R590 Localized enlarged lymph nodes: Secondary | ICD-10-CM | POA: Insufficient documentation

## 2024-01-30 DIAGNOSIS — N6310 Unspecified lump in the right breast, unspecified quadrant: Secondary | ICD-10-CM | POA: Insufficient documentation

## 2024-01-30 DIAGNOSIS — R0602 Shortness of breath: Secondary | ICD-10-CM | POA: Diagnosis not present

## 2024-01-30 DIAGNOSIS — D709 Neutropenia, unspecified: Secondary | ICD-10-CM | POA: Insufficient documentation

## 2024-01-30 DIAGNOSIS — Z7902 Long term (current) use of antithrombotics/antiplatelets: Secondary | ICD-10-CM | POA: Diagnosis not present

## 2024-01-30 LAB — CMP (CANCER CENTER ONLY)
ALT: 88 U/L — ABNORMAL HIGH (ref 0–44)
AST: 42 U/L — ABNORMAL HIGH (ref 15–41)
Albumin: 4.4 g/dL (ref 3.5–5.0)
Alkaline Phosphatase: 73 U/L (ref 38–126)
Anion gap: 10 (ref 5–15)
BUN: 18 mg/dL (ref 8–23)
CO2: 26 mmol/L (ref 22–32)
Calcium: 9.5 mg/dL (ref 8.9–10.3)
Chloride: 100 mmol/L (ref 98–111)
Creatinine: 0.72 mg/dL (ref 0.44–1.00)
GFR, Estimated: >60 mL/min (ref >60)
Glucose, Bld: 105 mg/dL — ABNORMAL HIGH (ref 70–99)
Potassium: 4.2 mmol/L (ref 3.5–5.1)
Sodium: 136 mmol/L (ref 135–145)
Total Bilirubin: 0.4 mg/dL (ref 0.0–1.2)
Total Protein: 7.1 g/dL (ref 6.5–8.1)

## 2024-01-30 LAB — CBC WITH DIFFERENTIAL (CANCER CENTER ONLY)
Abs Immature Granulocytes: 0.01 K/uL (ref 0.00–0.07)
Basophils Absolute: 0 K/uL (ref 0.0–0.1)
Basophils Relative: 0 %
Eosinophils Absolute: 0 K/uL (ref 0.0–0.5)
Eosinophils Relative: 0 %
HCT: 41.1 % (ref 36.0–46.0)
Hemoglobin: 13.5 g/dL (ref 12.0–15.0)
Immature Granulocytes: 0 %
Lymphocytes Relative: 6 %
Lymphs Abs: 0.2 K/uL — ABNORMAL LOW (ref 0.7–4.0)
MCH: 33.1 pg (ref 26.0–34.0)
MCHC: 32.8 g/dL (ref 30.0–36.0)
MCV: 100.7 fL — ABNORMAL HIGH (ref 80.0–100.0)
Monocytes Absolute: 0.3 K/uL (ref 0.1–1.0)
Monocytes Relative: 10 %
Neutro Abs: 2.7 K/uL (ref 1.7–7.7)
Neutrophils Relative %: 84 %
Platelet Count: 170 K/uL (ref 150–400)
RBC: 4.08 MIL/uL (ref 3.87–5.11)
RDW: 12.6 % (ref 11.5–15.5)
WBC Count: 3.3 K/uL — ABNORMAL LOW (ref 4.0–10.5)
nRBC: 0 % (ref 0.0–0.2)

## 2024-01-30 NOTE — Progress Notes (Deleted)
 [facility]  [address] 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  She had been on observation alone.  She has progression of her lymphadenopathy by exam and by CT scan in May.  She has been taking oral chemotherapy with venetoclax  200 mg daily since June 16, after a slow titration. She initially had significant neutropenia felt to be due to venetoclax  the WBCs have fluctuated up and down somewhat.  She has had a good response to treatment with decreasing lymphadenopathy and normalization of her hemoglobin and platelets.  She has had and increase in the transaminases, which have fluctuated up and down.     She is experiencing severe fatigue, but otherwise tolerating venetoclax  well.  She has had a significant increase in her transaminases, with grade 2 elevation of ALT.  This is likely multifactorial.  As her lipid panel was normal, I recommended she go ahead and decrease the atorvastatin  to 40 mg daily.  We will continue venetoclax  200 mg daily, however, we will need to monitor her more closely again.  We will plan to see her back in 1 week with a CBC, comprehensive metabolic panel, phosphorus and uric acid.   Abnormal transaminases Mild elevation of the transaminases.  Dr. Cornelius recommended she decrease atorvastatin  to 40 mg daily, but that was not done, as a lipid panel was requested.  This was normal.  I reached out to her neurology provider, but advised the patient to decrease the atorvastatin  to 40 mg daily beginning tomorrow.   Breast mass in female New bilateral breast soft tissue masses right greater than left.  I will obtain bilateral diagnostic mammogram and breast ultrasounds for further evaluation.      The patient understands  the plans discussed today and is in agreement with them.  She knows to contact our office if she develops concerns prior to her next appointment.   I provided 40 minutes of face-to-face time during this encounter and > 50% was spent counseling as documented under my assessment and plan.    Dawn DELENA Bach, NP  [facility] [address] [place] [address] Dept: [phone] Dept Fax: [phone]   No orders of the defined types were placed in this encounter.     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  She had been on observation only. She has had stable bilateral cervical and inguinal lymphadenopathy.  MRI imaging from February 2023 revealed extensive inguinal and retroperitoneal lymphadenopathy.  Her physical exam reveals a modest change in her cervical and supraclavicular adenopathy. PET scan in March 2023 revealed continued stability of mild adenopathy in the neck, chest, abdomen and pelvis with low level FDG and Deauville 2-3 category uptake. CT imaging in September 2023 shows the numerous mildly enlarged nodes to be unchanged. Her lymphadenopathy is relatively stable at this time but just mildly increased. CT chest, abdomen and pelvis in June 2024 revealed stable bilateral axillary and supraclavicular adenopathy, no mediastinal lymphadenopathy, with a new band of linear consolidation in the  left upper lobe, felt to be post infectious consolidation. She has stable retroperitoneal and proximal iliac lymphadenopathy, and no new adenopathy in the abdomen and pelvis.  Spleen was normal and no skeletal lesions.  She had progressive lymphadenopathy as well as mild anemia and thrombocytopenia in May, so was started on venetoclax  in  June.  Venetoclax  was slowly titrated beginning with 20 mg daily for 1 week, 50 mg daily for 1 week, 100 mg daily for 1 week, and then 200 mg daily.  She has tolerated this fairly well with improvement in her lymphadenopathy, anemia and thrombocytopenia.  She has experienced severe fatigue and moderate nausea.   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)  11/11/2015 Cancer Staging   Staging form: Hodgkin and Non-Hodgkin Lymphoma, AJCC 8th Edition - Clinical stage from 11/11/2015: Stage III (Small lymphocytic leukemia) - Signed by Cornelius Wanda DEL, MD on 01/28/2021 Histopathologic type: Malignant lymphoma, small B lymphocytic, NOS (see also M-9823/3) Stage prefix: Initial diagnosis Diagnostic confirmation: Positive histology PLUS positive immunophenotyping and/or positive genetic studies Specimen type: Core Needle Biopsy Staged by: Managing physician Stage used in treatment planning: Yes National guidelines used in treatment planning: Yes Type of national guideline used in treatment planning: NCCN Staging comments: Watchful waiting   06/20/2020 Initial Diagnosis   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)   Chronic lymphocytic leukemia (CLL), B-cell (HCC)  10/31/2015 Initial Diagnosis   Chronic lymphocytic leukemia (CLL), B-cell (HCC)   11/11/2015 Cancer Staging   Staging form: Chronic Lymphocytic Leukemia / Small Lymphocytic Lymphoma, AJCC 8th Edition - Clinical stage from 11/11/2015: Modified Rai Stage III (Modified Rai risk: High, Binet: Stage B, Lugano: Stage III, Lymphocytosis: Absent, Adenopathy: Present, Organomegaly: Absent, Anemia: Absent, Thrombocytopenia: Absent) - Signed by Cornelius Wanda DEL, MD on 04/05/2021 Histopathologic type: B-cell lymphocytic leukemia/small lymphocytic lymphoma (see also M-9670/3) Stage prefix: Initial diagnosis Stage used in treatment planning: Yes National guidelines used in treatment planning:  Yes Type of national guideline used in treatment planning: NCCN       INTERVAL HISTORY:   Dawn Prince is here today for repeat clinical assessment.  She states she continues to venetoclax  200 mg daily.  She states her nausea is well-controlled with prochlorperazine  in the morning.  Unfortunately, she continues to have severe fatigue, which is most likely secondary to venetoclax .  She states she is limited in activity due to the fatigue. She reports shortness of breath with exertion.   She denies fevers or chills. She reports severe lower back pain due to degenerative disease.  She was started on Lyrica last week.  She has had trouble sleeping, this is improved with Lyrica.  Her blood pressure has been higher than normal, so she was started on hydrochlorothiazide this week.  She states she has not decreased her atorvastatin  dose, as her neurologist was awaiting a repeat lipid panel.  This was done last month and was normal, but the patient states she was not notified whether to decrease the atorvastatin  dose or not.  Her appetite is good. Her weight has increased 3 pounds over last month. She reports new thickening of the lateral aspect of both breasts.  Bilateral screening mammogram in January was negative.  She states that she was scheduled to see Dr. Jefferey yesterday, but the office had to reschedule in 2 weeks.  REVIEW OF SYSTEMS:   Review of Systems  Constitutional:  Positive for fatigue. Negative for appetite change, chills, fever and unexpected weight change.  HENT:   Negative for lump/mass, mouth sores and sore throat.   Respiratory:  Positive for shortness of breath (with exertion). Negative for cough.   Cardiovascular:  Negative for chest pain and leg swelling.  Gastrointestinal:  Negative for abdominal pain, constipation, diarrhea, nausea and vomiting.  Genitourinary:  Negative for difficulty urinating, dysuria, frequency and hematuria.   Musculoskeletal:  Positive for back pain (lower).  Negative for arthralgias, gait problem, myalgias and neck pain.  Skin:  Negative for rash.  Neurological:  Negative for dizziness, extremity weakness, gait problem, headaches, light-headedness and numbness.  Hematological:  Negative for adenopathy. Does not bruise/bleed easily.  Psychiatric/Behavioral:  Negative for depression and sleep disturbance. The patient is not nervous/anxious.      VITALS:   There were no vitals taken for this visit.  Wt Readings from Last 3 Encounters:  01/23/24 113 lb 4.8 oz (51.4 kg)  01/07/24 110 lb (49.9 kg)  12/24/23 110 lb 8 oz (50.1 kg)    There is no height or weight on file to calculate BMI.  Performance status (ECOG): 1 - Symptomatic but completely ambulatory    PHYSICAL EXAM:   Physical Exam Vitals and nursing note reviewed.  Constitutional:      General: She is not in acute distress.    Appearance: Normal appearance.  HENT:     Head: Normocephalic and atraumatic.     Mouth/Throat:     Mouth: Mucous membranes are moist.     Pharynx: Oropharynx is clear. No oropharyngeal exudate or posterior oropharyngeal erythema.  Eyes:     General: No scleral icterus.    Extraocular Movements: Extraocular movements intact.     Conjunctiva/sclera: Conjunctivae normal.     Pupils: Pupils are equal, round, and reactive to light.  Cardiovascular:     Rate and Rhythm: Normal rate and regular rhythm.     Heart sounds: Normal heart sounds. No murmur heard.    No friction rub. No gallop.  Pulmonary:     Effort: Pulmonary effort is normal.     Breath sounds: Normal breath sounds. No wheezing, rhonchi or rales.  Abdominal:     General: There is no distension.     Palpations: Abdomen is soft. There is no hepatomegaly, splenomegaly or mass.     Tenderness: There is no abdominal tenderness.  Musculoskeletal:        General: Normal range of motion.     Cervical back: Normal range of motion and neck supple. No tenderness.     Right lower leg: No edema.      Left lower leg: No edema.  Lymphadenopathy:     Cervical: No cervical adenopathy.     Upper Body:     Right upper body: No supraclavicular or axillary adenopathy.     Left upper body: No supraclavicular or axillary adenopathy.     Lower Body: No right inguinal adenopathy. Left inguinal adenopathy (barely palpable tiny node in left groin) present.  Skin:    General: Skin is warm and dry.     Coloration: Skin is not jaundiced.     Findings: No rash.  Neurological:     Mental Status: She is alert and oriented to person, place, and time.     Cranial Nerves: No cranial nerve deficit.  Psychiatric:        Mood and Affect: Mood normal.        Behavior: Behavior normal.        Thought Content: Thought content normal.  LABS:      Latest Ref Rng & Units 01/23/2024    8:34 AM 01/05/2024    8:28 AM 12/24/2023   12:55 PM  CBC  WBC 4.0 - 10.5 K/uL 2.8  1.9  3.6   Hemoglobin 12.0 - 15.0 g/dL 86.2  86.6  86.5   Hematocrit 36.0 - 46.0 % 40.8  39.9  40.4   Platelets 150 - 400 K/uL 171  167  175       Latest Ref Rng & Units 01/23/2024    8:34 AM 01/05/2024    8:28 AM 12/24/2023   12:55 PM  CMP  Glucose 70 - 99 mg/dL 87  879  91   BUN 8 - 23 mg/dL 17  15  11    Creatinine 0.44 - 1.00 mg/dL 9.24  9.31  9.23   Sodium 135 - 145 mmol/L 136  137  137   Potassium 3.5 - 5.1 mmol/L 4.2  4.0  3.8   Chloride 98 - 111 mmol/L 98  99  99   CO2 22 - 32 mmol/L 26  25  27    Calcium  8.9 - 10.3 mg/dL 9.5  9.5  9.6   Total Protein 6.5 - 8.1 g/dL 7.0  7.3  7.2   Total Bilirubin 0.0 - 1.2 mg/dL 0.4  0.5  0.3   Alkaline Phos 38 - 126 U/L 88  71  90   AST 15 - 41 U/L 76  36  41   ALT 0 - 44 U/L 176  46  67     Lab Results  Component Value Date   TIBC 322 12/12/2022   TIBC 331 07/23/2021   FERRITIN 19 12/12/2022   FERRITIN 22 07/23/2021   IRONPCTSAT 30 12/12/2022   IRONPCTSAT 20 07/23/2021   Lab Results  Component Value Date   LDH 163 10/15/2023   LDH 162 07/01/2023   LDH 136 10/30/2021     STUDIES:   No results found.    HISTORY:   Past Medical History:  Diagnosis Date   Allergy    seasonal   Arthritis    Bipolar 1 disorder (HCC)    Cancer (HCC)    non hodgkins lymphoma   Depression    GERD (gastroesophageal reflux disease)    History of degenerative disc disease    Hyperlipidemia    Increased risk of breast cancer 04/25/2021   Obstructive sleep apnea    Osteopenia    Scoliosis    Thyroid  disease    hypothyroidism   Urticaria    Vitamin D deficiency     Past Surgical History:  Procedure Laterality Date   BREAST BIOPSY     BUBBLE STUDY  02/13/2021   Procedure: BUBBLE STUDY;  Surgeon: Raford Riggs, MD;  Location: [facility] ENDOSCOPY;  Service: Cardiovascular;;   CARPAL TUNNEL RELEASE Bilateral 2002   CESAREAN SECTION     x2   COLONOSCOPY  07/26/2008   Melanosis coli. Small internal hemorrhoids.    ENDOSCOPIC PLANTAR FASCIOTOMY     ESOPHAGOGASTRODUODENOSCOPY  03/17/2013   Mild gastritis. Status post esophageal dilatation.   LYMPH NODE BIOPSY     right hip repair torn tendon Right 09/2021   TEE WITHOUT CARDIOVERSION N/A 02/13/2021   Procedure: TRANSESOPHAGEAL ECHOCARDIOGRAM (TEE);  Surgeon: Raford Riggs, MD;  Location: [facility] ENDOSCOPY;  Service: Cardiovascular;  Laterality: N/A;   WISDOM TOOTH EXTRACTION      Family History  Problem Relation Age of Onset   Prostate cancer Father 62   Melanoma  Father 39   High blood pressure Father    Breast cancer Maternal Aunt    Multiple myeloma Maternal Aunt    Breast cancer Maternal Aunt    Colon cancer Neg Hx    Colon polyps Neg Hx    Esophageal cancer Neg Hx    Rectal cancer Neg Hx    Stomach cancer Neg Hx     Social History:  reports that she has never smoked. She has never used smokeless tobacco. She reports that she does not currently use alcohol. She reports that she does not use drugs.The patient is accompanied by her husband today.  Allergies:  Allergies  Allergen Reactions   Diclofenac  Sodium Other (See Comments)    Thought it had something to do with her having a stroke   Voltaren [Diclofenac Sodium]     Thought it had something to do with her having a stroke   Nsaids Other (See Comments)    Had a stroke and worries it was related to the Diclofenac she took.    Current Medications: Current Outpatient Medications  Medication Sig Dispense Refill   amitriptyline  (ELAVIL ) 10 MG tablet Take 1 tablet (10 mg total) by mouth at bedtime. (Patient not taking: Reported on 01/23/2024) 30 tablet 11   Ascorbic Acid (VITAMIN C) 1000 MG tablet Take 1,000 mg by mouth 2 (two) times daily.     atorvastatin  (LIPITOR) 80 MG tablet Take 1 tablet (80 mg total) by mouth daily. 30 tablet 5   estradiol (ESTRACE) 0.1 MG/GM vaginal cream 2 (two) times a week.     FLUoxetine  HCl (PROZAC  PO) Take 60 mg by mouth daily.     hydrochlorothiazide (HYDRODIURIL) 12.5 MG tablet Take 12.5 mg by mouth every morning.     Multiple Vitamin (MULTIVITAMIN ADULT PO) Take by mouth. Vision MD once a day     ondansetron  (ZOFRAN -ODT) 4 MG disintegrating tablet Take 1 tablet (4 mg total) by mouth every 8 (eight) hours as needed for nausea or vomiting. 20 tablet 2   PLAVIX  75 MG tablet Take 75 mg by mouth daily.     pregabalin (LYRICA) 75 MG capsule Take 75 mg by mouth 2 (two) times daily. (Patient taking differently: Take 75 mg by mouth 2 (two) times daily.)     Probiotic Product (PROBIOTIC BLEND PO) Take by mouth at bedtime.     prochlorperazine  (COMPAZINE ) 10 MG tablet Take 1 tablet (10 mg total) by mouth every 6 (six) hours as needed for nausea or vomiting. 30 tablet 5   ramipril  (ALTACE ) 2.5 MG capsule Take by mouth. 1 in am in 1 in pm     venetoclax  (VENCLEXTA ) 100 MG tablet Take 2 tablets (200 mg total) by mouth daily. Tablets should be swallowed whole with a meal and a full glass of water. 60 tablet 5   No current facility-administered medications for this visit.

## 2024-01-30 NOTE — Progress Notes (Signed)
 [facility]  [address] 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  She had been on observation alone.  She has progression of her lymphadenopathy by exam and by CT scan in May.  She has been taking oral chemotherapy with venetoclax  200 mg daily since June 16, after a slow titration. She initially had significant neutropenia felt to be due to venetoclax  the WBCs have fluctuated up and down somewhat.  She has had a good response to treatment with decreasing lymphadenopathy and normalization of her hemoglobin and platelets.  She has had and increase in the transaminases, which have fluctuated up and down.     She is experiencing severe fatigue, but otherwise tolerating venetoclax  well.  She has had a significant increase in her transaminases, with grade 2 elevation of ALT.  This is likely multifactorial.  As her lipid panel was normal, Kelli recommended she go ahead and decrease the atorvastatin  to 40 mg daily.  We will continue venetoclax  200 mg daily, however, we will need to monitor her more closely again.  We will plan to see her back in 1 week with a CBC, comprehensive metabolic panel, phosphorus and uric acid.   Abnormal transaminases Mild elevation of the transaminases.  Dr. Cornelius recommended she decrease atorvastatin  to 40 mg daily, but that was not done, as a lipid panel was requested.  This was normal.  I reached out to her neurology provider, but advised the patient to decrease the atorvastatin  to 40 mg daily beginning tomorrow. Liver enzymes have improved significantly today.    Breast mass in female New bilateral breast soft tissue masses right greater than left. She is scheduled for bilateral diagnostic mammogram and breast  ultrasounds on 09/09 for further evaluation. She will return to clinic 09/12 for repeat evaluation with Dr. Cornelius.       The patient understands the plans discussed today and is in agreement with them.  She knows to contact our office if she develops concerns prior to her next appointment.   I provided 20 minutes of face-to-face time during this encounter and > 50% was spent counseling as documented under my assessment and plan.    Eleanor DELENA Bach, NP   [facility] DEPT OF [facility] [address] [place] [address] Dept: [phone] Dept Fax: [phone]   No orders of the defined types were placed in this encounter.     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  She had been on observation only. She has had stable bilateral cervical and inguinal lymphadenopathy.  MRI imaging from February 2023 revealed extensive inguinal and retroperitoneal lymphadenopathy.  Her physical exam reveals a modest change in her cervical and supraclavicular adenopathy. PET scan in March 2023 revealed continued stability of mild adenopathy in the neck, chest, abdomen and pelvis with low level FDG and Deauville 2-3 category uptake. CT imaging in September 2023 shows the numerous mildly enlarged nodes to be unchanged. Her lymphadenopathy is relatively stable at this time but just mildly increased. CT chest, abdomen and pelvis  in June 2024 revealed stable bilateral axillary and supraclavicular adenopathy, no mediastinal lymphadenopathy, with a new band of linear consolidation in the left upper lobe, felt to be post infectious consolidation. She has stable retroperitoneal and proximal iliac lymphadenopathy, and no new adenopathy in the abdomen and pelvis.  Spleen was  normal and no skeletal lesions.  She had progressive lymphadenopathy as well as mild anemia and thrombocytopenia in May, so was started on venetoclax  in June.  Venetoclax  was slowly titrated beginning with 20 mg daily for 1 week, 50 mg daily for 1 week, 100 mg daily for 1 week, and then 200 mg daily.  She has tolerated this fairly well with improvement in her lymphadenopathy, anemia and thrombocytopenia.  She has experienced severe fatigue and moderate nausea.   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)  11/11/2015 Cancer Staging   Staging form: Hodgkin and Non-Hodgkin Lymphoma, AJCC 8th Edition - Clinical stage from 11/11/2015: Stage III (Small lymphocytic leukemia) - Signed by Cornelius Wanda DEL, MD on 01/28/2021 Histopathologic type: Malignant lymphoma, small B lymphocytic, NOS (see also M-9823/3) Stage prefix: Initial diagnosis Diagnostic confirmation: Positive histology PLUS positive immunophenotyping and/or positive genetic studies Specimen type: Core Needle Biopsy Staged by: Managing physician Stage used in treatment planning: Yes National guidelines used in treatment planning: Yes Type of national guideline used in treatment planning: NCCN Staging comments: Watchful waiting   06/20/2020 Initial Diagnosis   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)   Chronic lymphocytic leukemia (CLL), B-cell (HCC)  10/31/2015 Initial Diagnosis   Chronic lymphocytic leukemia (CLL), B-cell (HCC)   11/11/2015 Cancer Staging   Staging form: Chronic Lymphocytic Leukemia / Small Lymphocytic Lymphoma, AJCC 8th Edition - Clinical stage from 11/11/2015: Modified Rai Stage III (Modified Rai risk: High, Binet: Stage B, Lugano: Stage III, Lymphocytosis: Absent, Adenopathy: Present, Organomegaly: Absent, Anemia: Absent, Thrombocytopenia: Absent) - Signed by Cornelius Wanda DEL, MD on 04/05/2021 Histopathologic type: B-cell lymphocytic leukemia/small  lymphocytic lymphoma (see also M-9670/3) Stage prefix: Initial diagnosis Stage used in treatment planning: Yes National guidelines used in treatment planning: Yes Type of national guideline used in treatment planning: NCCN       INTERVAL HISTORY:   Rosina is here today for repeat clinical assessment.  She states she continues to venetoclax  200 mg daily.  She states her nausea is well-controlled with prochlorperazine  in the morning.  Unfortunately, she continues to have severe fatigue, which is most likely secondary to venetoclax .  She states she is limited in activity due to the fatigue. She reports shortness of breath with exertion.   She denies fevers or chills. She reports severe lower back pain due to degenerative disease.  She was started on Lyrica last week.  She has had trouble sleeping, this is improved with Lyrica.  Her blood pressure has been higher than normal, so she was started on hydrochlorothiazide.  She states she has decreased her atorvastatin  dose to 40 mg.  Her appetite is good. Her weight has increased 3 pounds over last month. She reports new thickening of the lateral aspect of both breasts.  Bilateral screening mammogram in January was negative.  She states that she was scheduled to see Dr. Jefferey yesterday, but the office had to reschedule in 2 weeks.  REVIEW OF SYSTEMS:   Review of Systems  Constitutional:  Positive for fatigue. Negative for appetite change, chills, fever and unexpected weight change.  HENT:   Negative for lump/mass, mouth sores and sore throat.  Respiratory:  Positive for shortness of breath (with exertion). Negative for cough.   Cardiovascular:  Negative for chest pain and leg swelling.  Gastrointestinal:  Negative for abdominal pain, constipation, diarrhea, nausea and vomiting.  Genitourinary:  Negative for difficulty urinating, dysuria, frequency and hematuria.   Musculoskeletal:  Positive for back pain (lower). Negative for arthralgias, gait  problem, myalgias and neck pain.  Skin:  Negative for rash.  Neurological:  Negative for dizziness, extremity weakness, gait problem, headaches, light-headedness and numbness.  Hematological:  Negative for adenopathy. Does not bruise/bleed easily.  Psychiatric/Behavioral:  Negative for depression and sleep disturbance. The patient is not nervous/anxious.      VITALS:   There were no vitals taken for this visit.  Wt Readings from Last 3 Encounters:  01/23/24 113 lb 4.8 oz (51.4 kg)  01/07/24 110 lb (49.9 kg)  12/24/23 110 lb 8 oz (50.1 kg)    There is no height or weight on file to calculate BMI.  Performance status (ECOG): 1 - Symptomatic but completely ambulatory    PHYSICAL EXAM:   Physical Exam Vitals and nursing note reviewed.  Constitutional:      General: She is not in acute distress.    Appearance: Normal appearance.  HENT:     Head: Normocephalic and atraumatic.     Mouth/Throat:     Mouth: Mucous membranes are moist.     Pharynx: Oropharynx is clear. No oropharyngeal exudate or posterior oropharyngeal erythema.  Eyes:     General: No scleral icterus.    Extraocular Movements: Extraocular movements intact.     Conjunctiva/sclera: Conjunctivae normal.     Pupils: Pupils are equal, round, and reactive to light.  Cardiovascular:     Rate and Rhythm: Normal rate and regular rhythm.     Heart sounds: Normal heart sounds. No murmur heard.    No friction rub. No gallop.  Pulmonary:     Effort: Pulmonary effort is normal.     Breath sounds: Normal breath sounds. No wheezing, rhonchi or rales.  Abdominal:     General: There is no distension.     Palpations: Abdomen is soft. There is no hepatomegaly, splenomegaly or mass.     Tenderness: There is no abdominal tenderness.  Musculoskeletal:        General: Normal range of motion.     Cervical back: Normal range of motion and neck supple. No tenderness.     Right lower leg: No edema.     Left lower leg: No edema.   Lymphadenopathy:     Cervical: No cervical adenopathy.     Upper Body:     Right upper body: No supraclavicular or axillary adenopathy.     Left upper body: No supraclavicular or axillary adenopathy.     Lower Body: No right inguinal adenopathy. Left inguinal adenopathy (barely palpable tiny node in left groin) present.  Skin:    General: Skin is warm and dry.     Coloration: Skin is not jaundiced.     Findings: No rash.  Neurological:     Mental Status: She is alert and oriented to person, place, and time.     Cranial Nerves: No cranial nerve deficit.  Psychiatric:        Mood and Affect: Mood normal.        Behavior: Behavior normal.        Thought Content: Thought content normal.     LABS:      Latest Ref Rng & Units 01/30/2024  9:16 AM 01/23/2024    8:34 AM 01/05/2024    8:28 AM  CBC  WBC 4.0 - 10.5 K/uL 3.3  2.8  1.9   Hemoglobin 12.0 - 15.0 g/dL 86.4  86.2  86.6   Hematocrit 36.0 - 46.0 % 41.1  40.8  39.9   Platelets 150 - 400 K/uL 170  171  167       Latest Ref Rng & Units 01/30/2024    9:16 AM 01/23/2024    8:34 AM 01/05/2024    8:28 AM  CMP  Glucose 70 - 99 mg/dL 894  87  879   BUN 8 - 23 mg/dL 18  17  15    Creatinine 0.44 - 1.00 mg/dL 9.27  9.24  9.31   Sodium 135 - 145 mmol/L 136  136  137   Potassium 3.5 - 5.1 mmol/L 4.2  4.2  4.0   Chloride 98 - 111 mmol/L 100  98  99   CO2 22 - 32 mmol/L 26  26  25    Calcium  8.9 - 10.3 mg/dL 9.5  9.5  9.5   Total Protein 6.5 - 8.1 g/dL 7.1  7.0  7.3   Total Bilirubin 0.0 - 1.2 mg/dL 0.4  0.4  0.5   Alkaline Phos 38 - 126 U/L 73  88  71   AST 15 - 41 U/L 42  76  36   ALT 0 - 44 U/L 88  176  46     Lab Results  Component Value Date   TIBC 322 12/12/2022   TIBC 331 07/23/2021   FERRITIN 19 12/12/2022   FERRITIN 22 07/23/2021   IRONPCTSAT 30 12/12/2022   IRONPCTSAT 20 07/23/2021   Lab Results  Component Value Date   LDH 163 10/15/2023   LDH 162 07/01/2023   LDH 136 10/30/2021    STUDIES:   No results  found.    HISTORY:   Past Medical History:  Diagnosis Date   Allergy    seasonal   Arthritis    Bipolar 1 disorder (HCC)    Cancer (HCC)    non hodgkins lymphoma   Depression    GERD (gastroesophageal reflux disease)    History of degenerative disc disease    Hyperlipidemia    Increased risk of breast cancer 04/25/2021   Obstructive sleep apnea    Osteopenia    Scoliosis    Thyroid  disease    hypothyroidism   Urticaria    Vitamin D deficiency     Past Surgical History:  Procedure Laterality Date   BREAST BIOPSY     BUBBLE STUDY  02/13/2021   Procedure: BUBBLE STUDY;  Surgeon: Raford Riggs, MD;  Location[facility] ENDOSCOPY;  Service: Cardiovascular;;   CARPAL TUNNEL RELEASE Bilateral 2002   CESAREAN SECTION     x2   COLONOSCOPY  07/26/2008   Melanosis coli. Small internal hemorrhoids.    ENDOSCOPIC PLANTAR FASCIOTOMY     ESOPHAGOGASTRODUODENOSCOPY  03/17/2013   Mild gastritis. Status post esophageal dilatation.   LYMPH NODE BIOPSY     right hip repair torn tendon Right 09/2021   TEE WITHOUT CARDIOVERSION N/A 02/13/2021   Procedure: TRANSESOPHAGEAL ECHOCARDIOGRAM (TEE);  Surgeon: Raford Riggs, MD;  Location: [facility] ENDOSCOPY;  Service: Cardiovascular;  Laterality: N/A;   WISDOM TOOTH EXTRACTION      Family History  Problem Relation Age of Onset   Prostate cancer Father 67   Melanoma Father 1   High blood pressure Father    Breast cancer  Maternal Aunt    Multiple myeloma Maternal Aunt    Breast cancer Maternal Aunt    Colon cancer Neg Hx    Colon polyps Neg Hx    Esophageal cancer Neg Hx    Rectal cancer Neg Hx    Stomach cancer Neg Hx     Social History:  reports that she has never smoked. She has never used smokeless tobacco. She reports that she does not currently use alcohol. She reports that she does not use drugs.The patient is accompanied by her husband today.  Allergies:  Allergies  Allergen Reactions   Diclofenac Sodium Other (See Comments)     Thought it had something to do with her having a stroke   Voltaren [Diclofenac Sodium]     Thought it had something to do with her having a stroke   Nsaids Other (See Comments)    Had a stroke and worries it was related to the Diclofenac she took.    Current Medications: Current Outpatient Medications  Medication Sig Dispense Refill   Ascorbic Acid (VITAMIN C) 1000 MG tablet Take 1,000 mg by mouth 2 (two) times daily.     atorvastatin  (LIPITOR) 80 MG tablet Take 1 tablet (80 mg total) by mouth daily. 30 tablet 5   estradiol (ESTRACE) 0.1 MG/GM vaginal cream 2 (two) times a week.     FLUoxetine  HCl (PROZAC  PO) Take 60 mg by mouth daily.     hydrochlorothiazide (HYDRODIURIL) 12.5 MG tablet Take 12.5 mg by mouth every morning.     Multiple Vitamin (MULTIVITAMIN ADULT PO) Take by mouth. Vision MD once a day     ondansetron  (ZOFRAN -ODT) 4 MG disintegrating tablet Take 1 tablet (4 mg total) by mouth every 8 (eight) hours as needed for nausea or vomiting. 20 tablet 2   PLAVIX  75 MG tablet Take 75 mg by mouth daily.     pregabalin (LYRICA) 75 MG capsule Take 75 mg by mouth 2 (two) times daily. (Patient taking differently: Take 75 mg by mouth 2 (two) times daily.)     Probiotic Product (PROBIOTIC BLEND PO) Take by mouth at bedtime.     prochlorperazine  (COMPAZINE ) 10 MG tablet Take 1 tablet (10 mg total) by mouth every 6 (six) hours as needed for nausea or vomiting. 30 tablet 5   ramipril  (ALTACE ) 2.5 MG capsule Take by mouth. 1 in am in 1 in pm     venetoclax  (VENCLEXTA ) 100 MG tablet Take 2 tablets (200 mg total) by mouth daily. Tablets should be swallowed whole with a meal and a full glass of water. 60 tablet 5   amitriptyline  (ELAVIL ) 10 MG tablet Take 1 tablet (10 mg total) by mouth at bedtime. (Patient not taking: Reported on 01/30/2024) 30 tablet 11   No current facility-administered medications for this visit.

## 2024-02-02 ENCOUNTER — Other Ambulatory Visit: Payer: Self-pay

## 2024-02-02 NOTE — Progress Notes (Signed)
 Lipitor decreased to 40mg  po qd.

## 2024-02-03 ENCOUNTER — Ambulatory Visit
Admission: RE | Admit: 2024-02-03 | Discharge: 2024-02-03 | Disposition: A | Discharge status: Home / Self Care | Source: Ambulatory Visit | Attending: Hematology and Oncology | Admitting: Hematology and Oncology

## 2024-02-03 DIAGNOSIS — R92333 Mammographic heterogeneous density, bilateral breasts: Secondary | ICD-10-CM | POA: Diagnosis not present

## 2024-02-03 DIAGNOSIS — N6325 Unspecified lump in the left breast, overlapping quadrants: Secondary | ICD-10-CM

## 2024-02-03 DIAGNOSIS — R928 Other abnormal and inconclusive findings on diagnostic imaging of breast: Secondary | ICD-10-CM | POA: Diagnosis not present

## 2024-02-03 DIAGNOSIS — N6489 Other specified disorders of breast: Secondary | ICD-10-CM | POA: Diagnosis not present

## 2024-02-03 DIAGNOSIS — N6315 Unspecified lump in the right breast, overlapping quadrants: Secondary | ICD-10-CM

## 2024-02-03 DIAGNOSIS — M5416 Radiculopathy, lumbar region: Secondary | ICD-10-CM | POA: Diagnosis not present

## 2024-02-03 NOTE — Progress Notes (Addendum)
 PROCEDURE PERFORMED:  Therapeutic left S1 selective nerve root block and L L5 selective nerve root block.     REASON FOR PROCEDURES:  Left lumbosacral radicular pain.   SURGEON:   Debby Kansky, M.D.   PROCEDURE:  After obtaining informed consent, the patient was placed in the prone position on the fluoroscopy table.  Blood pressure, pulse, and pulse oximetry was assessed pre and post-procedure. The lumbar skin was prepped and draped in a sterile fashion with Chloraprep and sterile towels.     A #25 gauge 3.5 inch spinal needle was placed under fluoroscopic control to the left S1 foramen angled caudally.  Approximately 1-2 mL of 240 mg/mL Omnipaque  contrast dye was injected, demonstrating S1 perineural spread. Key fluoroscopic images of the procedure were saved to a local server.  A total of 1.5 mL of 2% preservative free lidocaine  and 0.8 mL of 10 mg per mL of dexamethasone was instilled around the S1 nerve root.     A #25 gauge 3.5 inch spinal needle was placed under fluoroscopic control into the L L5 foramen just under the mid-portion of the pedicle.  Approximately 1-2 mL of 240 mg/mL Omnipaque  contrast dye was injected, demonstrating perineural spread.  Key fluoroscopic images of the procedure were saved to a local server. A total of 1.5 mL of 2% preservative free lidocaine  and 0.8 mL of 10 mg per mL of dexamethasone was instilled around the nerve root.     The patient tolerated the procedure well and was transitioned to the post procedure holding area uneventfully.  The patient was monitored for adverse allergic, paralytic and hypertensive reactions.  The patient was discharged without complication.

## 2024-02-04 DIAGNOSIS — Z8673 Personal history of transient ischemic attack (TIA), and cerebral infarction without residual deficits: Secondary | ICD-10-CM | POA: Diagnosis not present

## 2024-02-04 DIAGNOSIS — C9110 Chronic lymphocytic leukemia of B-cell type not having achieved remission: Secondary | ICD-10-CM | POA: Diagnosis not present

## 2024-02-04 DIAGNOSIS — E785 Hyperlipidemia, unspecified: Secondary | ICD-10-CM | POA: Diagnosis not present

## 2024-02-04 DIAGNOSIS — Z6822 Body mass index (BMI) 22.0-22.9, adult: Secondary | ICD-10-CM | POA: Diagnosis not present

## 2024-02-04 DIAGNOSIS — Z0000 Encounter for general adult medical examination without abnormal findings: Secondary | ICD-10-CM | POA: Diagnosis not present

## 2024-02-04 DIAGNOSIS — I10 Essential (primary) hypertension: Secondary | ICD-10-CM | POA: Diagnosis not present

## 2024-02-04 DIAGNOSIS — M48061 Spinal stenosis, lumbar region without neurogenic claudication: Secondary | ICD-10-CM | POA: Diagnosis not present

## 2024-02-06 ENCOUNTER — Inpatient Hospital Stay

## 2024-02-06 ENCOUNTER — Telehealth: Payer: Self-pay | Admitting: Oncology

## 2024-02-06 ENCOUNTER — Other Ambulatory Visit: Payer: Self-pay | Admitting: Oncology

## 2024-02-06 ENCOUNTER — Inpatient Hospital Stay (HOSPITAL_BASED_OUTPATIENT_CLINIC_OR_DEPARTMENT_OTHER): Admitting: Oncology

## 2024-02-06 ENCOUNTER — Encounter: Payer: Self-pay | Admitting: Oncology

## 2024-02-06 VITALS — BP 156/85 | HR 56 | Temp 97.6°F | Resp 18 | Ht 60.0 in | Wt 116.2 lb

## 2024-02-06 DIAGNOSIS — I2510 Atherosclerotic heart disease of native coronary artery without angina pectoris: Secondary | ICD-10-CM | POA: Diagnosis not present

## 2024-02-06 DIAGNOSIS — C8302 Small cell B-cell lymphoma, intrathoracic lymph nodes: Secondary | ICD-10-CM | POA: Diagnosis not present

## 2024-02-06 DIAGNOSIS — R7401 Elevation of levels of liver transaminase levels: Secondary | ICD-10-CM | POA: Diagnosis not present

## 2024-02-06 DIAGNOSIS — E785 Hyperlipidemia, unspecified: Secondary | ICD-10-CM | POA: Diagnosis not present

## 2024-02-06 DIAGNOSIS — D6959 Other secondary thrombocytopenia: Secondary | ICD-10-CM | POA: Diagnosis not present

## 2024-02-06 DIAGNOSIS — D709 Neutropenia, unspecified: Secondary | ICD-10-CM | POA: Diagnosis not present

## 2024-02-06 DIAGNOSIS — M545 Low back pain, unspecified: Secondary | ICD-10-CM | POA: Diagnosis not present

## 2024-02-06 DIAGNOSIS — N6320 Unspecified lump in the left breast, unspecified quadrant: Secondary | ICD-10-CM | POA: Diagnosis not present

## 2024-02-06 DIAGNOSIS — R0602 Shortness of breath: Secondary | ICD-10-CM | POA: Diagnosis not present

## 2024-02-06 DIAGNOSIS — Z7902 Long term (current) use of antithrombotics/antiplatelets: Secondary | ICD-10-CM | POA: Diagnosis not present

## 2024-02-06 DIAGNOSIS — Z886 Allergy status to analgesic agent status: Secondary | ICD-10-CM | POA: Diagnosis not present

## 2024-02-06 DIAGNOSIS — Z807 Family history of other malignant neoplasms of lymphoid, hematopoietic and related tissues: Secondary | ICD-10-CM | POA: Diagnosis not present

## 2024-02-06 DIAGNOSIS — N6310 Unspecified lump in the right breast, unspecified quadrant: Secondary | ICD-10-CM | POA: Diagnosis not present

## 2024-02-06 DIAGNOSIS — D649 Anemia, unspecified: Secondary | ICD-10-CM | POA: Diagnosis not present

## 2024-02-06 DIAGNOSIS — R590 Localized enlarged lymph nodes: Secondary | ICD-10-CM | POA: Diagnosis not present

## 2024-02-06 DIAGNOSIS — Z808 Family history of malignant neoplasm of other organs or systems: Secondary | ICD-10-CM | POA: Diagnosis not present

## 2024-02-06 DIAGNOSIS — Z79899 Other long term (current) drug therapy: Secondary | ICD-10-CM | POA: Diagnosis not present

## 2024-02-06 DIAGNOSIS — R5383 Other fatigue: Secondary | ICD-10-CM | POA: Diagnosis not present

## 2024-02-06 DIAGNOSIS — E039 Hypothyroidism, unspecified: Secondary | ICD-10-CM | POA: Diagnosis not present

## 2024-02-06 DIAGNOSIS — Z803 Family history of malignant neoplasm of breast: Secondary | ICD-10-CM | POA: Diagnosis not present

## 2024-02-06 DIAGNOSIS — M549 Dorsalgia, unspecified: Secondary | ICD-10-CM | POA: Diagnosis not present

## 2024-02-06 DIAGNOSIS — D519 Vitamin B12 deficiency anemia, unspecified: Secondary | ICD-10-CM | POA: Diagnosis not present

## 2024-02-06 DIAGNOSIS — C9110 Chronic lymphocytic leukemia of B-cell type not having achieved remission: Secondary | ICD-10-CM | POA: Diagnosis not present

## 2024-02-06 DIAGNOSIS — Z8719 Personal history of other diseases of the digestive system: Secondary | ICD-10-CM | POA: Diagnosis not present

## 2024-02-06 DIAGNOSIS — R110 Nausea: Secondary | ICD-10-CM | POA: Diagnosis not present

## 2024-02-06 DIAGNOSIS — M8580 Other specified disorders of bone density and structure, unspecified site: Secondary | ICD-10-CM | POA: Diagnosis not present

## 2024-02-06 LAB — CBC WITH DIFFERENTIAL (CANCER CENTER ONLY)
Abs Immature Granulocytes: 0.01 K/uL (ref 0.00–0.07)
Basophils Absolute: 0 K/uL (ref 0.0–0.1)
Basophils Relative: 0 %
Eosinophils Absolute: 0 K/uL (ref 0.0–0.5)
Eosinophils Relative: 0 %
HCT: 39.9 % (ref 36.0–46.0)
Hemoglobin: 13.5 g/dL (ref 12.0–15.0)
Immature Granulocytes: 0 %
Lymphocytes Relative: 7 %
Lymphs Abs: 0.2 K/uL — ABNORMAL LOW (ref 0.7–4.0)
MCH: 33.8 pg (ref 26.0–34.0)
MCHC: 33.8 g/dL (ref 30.0–36.0)
MCV: 100 fL (ref 80.0–100.0)
Monocytes Absolute: 0.5 K/uL (ref 0.1–1.0)
Monocytes Relative: 16 %
Neutro Abs: 2.6 K/uL (ref 1.7–7.7)
Neutrophils Relative %: 77 %
Platelet Count: 182 K/uL (ref 150–400)
RBC: 3.99 MIL/uL (ref 3.87–5.11)
RDW: 12.9 % (ref 11.5–15.5)
WBC Count: 3.4 K/uL — ABNORMAL LOW (ref 4.0–10.5)
nRBC: 0 % (ref 0.0–0.2)

## 2024-02-06 LAB — CMP (CANCER CENTER ONLY)
ALT: 122 U/L — ABNORMAL HIGH (ref 0–44)
AST: 62 U/L — ABNORMAL HIGH (ref 15–41)
Albumin: 4 g/dL (ref 3.5–5.0)
Alkaline Phosphatase: 75 U/L (ref 38–126)
Anion gap: 12 (ref 5–15)
BUN: 16 mg/dL (ref 8–23)
CO2: 27 mmol/L (ref 22–32)
Calcium: 9.4 mg/dL (ref 8.9–10.3)
Chloride: 98 mmol/L (ref 98–111)
Creatinine: 0.73 mg/dL (ref 0.44–1.00)
GFR, Estimated: >60 mL/min (ref >60)
Glucose, Bld: 97 mg/dL (ref 70–99)
Potassium: 4.3 mmol/L (ref 3.5–5.1)
Sodium: 136 mmol/L (ref 135–145)
Total Bilirubin: 0.3 mg/dL (ref 0.0–1.2)
Total Protein: 6.8 g/dL (ref 6.5–8.1)

## 2024-02-06 NOTE — Progress Notes (Signed)
 [facility]  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  She had been on observation alone.  She has progression of her lymphadenopathy by exam and by CT scan in May.  She has been taking oral chemotherapy with venetoclax  200 mg daily since June 16, after a slow titration. She initially had significant neutropenia. She has had a good response to treatment with decreasing lymphadenopathy and normalization of her hemoglobin and platelets.  She has had an increase in the transaminases, which have fluctuated up and down.  Her fatigue has improved and the nausea is controlled with daily Compazine .   Abnormal transaminases Mild elevation of the transaminases.  We decreased the atorvastatin  to 40 mg daily. Her most recent lipid panel looked good, but the liver transaminases continue to fluctuate up and down. If this does not improve, we will consider stopping atorvastatin  and change to Crestor .   Breast mass in female New bilateral breast soft tissue masses right greater than left.  Bilateral diagnostic mammogram and breast ultrasounds were negative and did note interval decrease in size of bilateral axillary adenopathy.  Plan: She is currently on treatment with Venetoclax  without difficulty. She feels much better and has no complaints of pain. She continues her BID 75 mg Lyrica and Q6 PRN Compazine  without difficulty. She states that her pain and nausea are well-controlled at this time. She recently decreased her atorvastatin  dosage to 40 mg daily and is taking this without difficulty. Her last lipid panel looked good, but the liver transaminases continue to fluctuate up and down. If this does not improve, we may stop the atorvastatin  and switch to  Crestor . She had a bilateral diagnostic mammogram performed on 02/03/2024 which revealed no mammographic or sonographic evidence of malignancy the sites of palpable concern bilaterally, and interval decrease in size of bilateral axillary adenopathy, consistent with treatment response for known lymphoma. Her palpable nodes continue to decrease in size. She has a low WBC of 3.4 with ANC of 2,600,stable, hemoglobin of 13.5, and platelet count of 182,000. Her CMP is normal other than an elevated AST of 62 up from 42 and an elevated ALT of122 up from 88. She had a steroid injection in her back and is no longer having pain. She will have CBC and CMP in 2 weeks and I will see her back in 4 weeks with CBC and CMP.  The patient understands the plans discussed today and is in agreement with them.  She knows to contact our office if she develops concerns prior to her next appointment.  I provided 17 minutes of face-to-face time during this encounter and > 50% was spent counseling as documented under my assessment and plan.    Dawn VEAR Cornish, MD  [facility] DEPT OF [facility] [address] [place] [address] Dept: [phone] Dept Fax: [phone]   No orders of the defined types were placed in this encounter.   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  She had been on observation only. She has had stable bilateral cervical  and inguinal lymphadenopathy.  MRI imaging from February 2023 revealed extensive inguinal and retroperitoneal lymphadenopathy.  Her physical exam reveals a modest change in her cervical and supraclavicular adenopathy. PET scan in March 2023 revealed continued stability of mild adenopathy in the neck, chest, abdomen and pelvis with low  level FDG and Deauville 2-3 category uptake. CT imaging in September 2023 shows the numerous mildly enlarged nodes to be unchanged. Her lymphadenopathy is relatively stable at this time but just mildly increased. CT chest, abdomen and pelvis in June 2024 revealed stable bilateral axillary and supraclavicular adenopathy, no mediastinal lymphadenopathy, with a new band of linear consolidation in the left upper lobe, felt to be post infectious consolidation. She has stable retroperitoneal and proximal iliac lymphadenopathy, and no new adenopathy in the abdomen and pelvis.  Spleen was normal and no skeletal lesions.  She had progressive lymphadenopathy as well as mild anemia and thrombocytopenia in May, so was started on venetoclax  in June.  Venetoclax  was slowly titrated beginning with 20 mg daily for 1 week, 50 mg daily for 1 week, 100 mg daily for 1 week, and then 200 mg daily.  She has tolerated this fairly well with improvement in her lymphadenopathy, anemia and thrombocytopenia.  She has experienced severe fatigue and moderate nausea.   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)  11/11/2015 Cancer Staging   Staging form: Hodgkin and Non-Hodgkin Lymphoma, AJCC 8th Edition - Clinical stage from 11/11/2015: Stage III (Small lymphocytic leukemia) - Signed by Cornelius Dawn DEL, MD on 01/28/2021 Histopathologic type: Malignant lymphoma, small B lymphocytic, NOS (see also M-9823/3) Stage prefix: Initial diagnosis Diagnostic confirmation: Positive histology PLUS positive immunophenotyping and/or positive genetic studies Specimen type: Core Needle Biopsy Staged by: Managing physician Stage used in treatment planning: Yes National guidelines used in treatment planning: Yes Type of national guideline used in treatment planning: NCCN Staging comments: Watchful waiting   06/20/2020 Initial Diagnosis   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)   Chronic  lymphocytic leukemia (CLL), B-cell (HCC)  10/31/2015 Initial Diagnosis   Chronic lymphocytic leukemia (CLL), B-cell (HCC)   11/11/2015 Cancer Staging   Staging form: Chronic Lymphocytic Leukemia / Small Lymphocytic Lymphoma, AJCC 8th Edition - Clinical stage from 11/11/2015: Modified Rai Stage III (Modified Rai risk: High, Binet: Stage B, Lugano: Stage III, Lymphocytosis: Absent, Adenopathy: Present, Organomegaly: Absent, Anemia: Absent, Thrombocytopenia: Absent) - Signed by Cornelius Dawn DEL, MD on 04/05/2021 Histopathologic type: B-cell lymphocytic leukemia/small lymphocytic lymphoma (see also M-9670/3) Stage prefix: Initial diagnosis Stage used in treatment planning: Yes National guidelines used in treatment planning: Yes Type of national guideline used in treatment planning: NCCN       INTERVAL HISTORY:   Dawn Prince is here today for repeat clinical assessment of her CLL/SLL, now on treatment with Venetoclax . Patient states that she feels much better and has no complaints of pain. She continues her BID 75 mg Lyrica and Q6 PRN Compazine  without difficulty. She states that her pain and nausea are well-controlled at this time. She recently decreased her atorvastatin  dosage to 40 mg daily and is taking this without difficulty. Her last lipid panel looked good, but the liver transaminases continue to fluctuate up and down. If this does not improve, we may stop the atorvastatin  and switch to Crestor . She had a bilateral diagnostic mammogram performed on 02/03/2024 which revealed no mammographic or sonographic evidence of malignancy the sites of palpable concern bilaterally, and interval decrease in size of bilateral axillary  adenopathy, consistent with treatment response for known lymphoma. Her palpable nodes continue to decrease in size. She has a low WBC of 3.4 with ANC of 2,600,stable, hemoglobin of 13.5, and platelet count of 182,000. Her CMP is normal other than an elevated AST of 62 up from 42 and  an elevated ALT of122 up from 88.  She had a steroid injection in her back and is no longer having pain. She will have CBC and CMP in 2 weeks and I will see her back in 4 weeks with CBC and CMP.  She denies fever, chills, night sweats, or other signs of infection. She denies cardiorespiratory and gastrointestinal issues. She  denies pain. Her appetite is good and Her weight has increased 3 pounds over last 2 weeks. This patient is accompanied in the office by her spouse.  REVIEW OF SYSTEMS:   Review of Systems  Constitutional:  Positive for fatigue. Negative for appetite change, chills, fever and unexpected weight change.  HENT:   Negative for lump/mass, mouth sores and sore throat.   Respiratory:  Negative for cough and shortness of breath.   Cardiovascular:  Negative for chest pain and leg swelling.  Gastrointestinal:  Negative for abdominal pain, constipation, diarrhea, nausea and vomiting.  Genitourinary:  Negative for difficulty urinating, dysuria, frequency and hematuria.   Musculoskeletal:  Negative for arthralgias, back pain (lower), gait problem, myalgias and neck pain.  Skin:  Negative for rash.  Neurological:  Negative for dizziness, extremity weakness, gait problem, headaches, light-headedness and numbness.  Hematological:  Negative for adenopathy. Does not bruise/bleed easily.  Psychiatric/Behavioral:  Negative for depression and sleep disturbance. The patient is not nervous/anxious.      VITALS:   Blood pressure (!) 156/85, pulse (!) 56, temperature 97.6 F (36.4 C), temperature source Oral, resp. rate 18, height 5' (1.524 m), weight 116 lb 3.2 oz (52.7 kg), SpO2 100%.  Wt Readings from Last 3 Encounters:  02/06/24 116 lb 3.2 oz (52.7 kg)  01/23/24 113 lb 4.8 oz (51.4 kg)  01/07/24 110 lb (49.9 kg)    Body mass index is 22.69 kg/m.  Performance status (ECOG): 1 - Symptomatic but completely ambulatory    PHYSICAL EXAM:   Physical Exam Vitals and nursing note  reviewed.  Constitutional:      General: She is not in acute distress.    Appearance: Normal appearance.  HENT:     Head: Normocephalic and atraumatic.     Mouth/Throat:     Mouth: Mucous membranes are moist.     Pharynx: Oropharynx is clear. No oropharyngeal exudate or posterior oropharyngeal erythema.  Eyes:     General: No scleral icterus.    Extraocular Movements: Extraocular movements intact.     Conjunctiva/sclera: Conjunctivae normal.     Pupils: Pupils are equal, round, and reactive to light.  Cardiovascular:     Rate and Rhythm: Normal rate and regular rhythm.     Heart sounds: Normal heart sounds. No murmur heard.    No friction rub. No gallop.  Pulmonary:     Effort: Pulmonary effort is normal.     Breath sounds: Normal breath sounds. No wheezing, rhonchi or rales.  Chest:     Comments: Fullness in bilateral supraclavicular areas Abdominal:     General: There is no distension.     Palpations: Abdomen is soft. There is no hepatomegaly, splenomegaly or mass.     Tenderness: There is no abdominal tenderness.  Musculoskeletal:        General: Normal  range of motion.     Cervical back: Normal range of motion and neck supple. No tenderness.     Right lower leg: No edema.     Left lower leg: No edema.  Lymphadenopathy:     Cervical: No cervical adenopathy.     Upper Body:     Right upper body: No supraclavicular or axillary adenopathy.     Left upper body: No supraclavicular or axillary adenopathy.     Lower Body: No right inguinal adenopathy. No left inguinal adenopathy.  Skin:    General: Skin is warm and dry.     Coloration: Skin is not jaundiced.     Findings: No rash.  Neurological:     Mental Status: She is alert and oriented to person, place, and time.     Cranial Nerves: No cranial nerve deficit.  Psychiatric:        Mood and Affect: Mood normal.        Behavior: Behavior normal.        Thought Content: Thought content normal.     LABS:      Latest  Ref Rng & Units 02/06/2024    9:00 AM 01/30/2024    9:16 AM 01/23/2024    8:34 AM  CBC  WBC 4.0 - 10.5 K/uL 3.4  3.3  2.8   Hemoglobin 12.0 - 15.0 g/dL 86.4  86.4  86.2   Hematocrit 36.0 - 46.0 % 39.9  41.1  40.8   Platelets 150 - 400 K/uL 182  170  171       Latest Ref Rng & Units 02/06/2024    9:00 AM 01/30/2024    9:16 AM 01/23/2024    8:34 AM  CMP  Glucose 70 - 99 mg/dL 97  894  87   BUN 8 - 23 mg/dL 16  18  17    Creatinine 0.44 - 1.00 mg/dL 9.26  9.27  9.24   Sodium 135 - 145 mmol/L 136  136  136   Potassium 3.5 - 5.1 mmol/L 4.3  4.2  4.2   Chloride 98 - 111 mmol/L 98  100  98   CO2 22 - 32 mmol/L 27  26  26    Calcium  8.9 - 10.3 mg/dL 9.4  9.5  9.5   Total Protein 6.5 - 8.1 g/dL 6.8  7.1  7.0   Total Bilirubin 0.0 - 1.2 mg/dL 0.3  0.4  0.4   Alkaline Phos 38 - 126 U/L 75  73  88   AST 15 - 41 U/L 62  42  76   ALT 0 - 44 U/L 122  88  176     Lab Results  Component Value Date   TIBC 322 12/12/2022   TIBC 331 07/23/2021   FERRITIN 19 12/12/2022   FERRITIN 22 07/23/2021   IRONPCTSAT 30 12/12/2022   IRONPCTSAT 20 07/23/2021   Lab Results  Component Value Date   LDH 163 10/15/2023   LDH 162 07/01/2023   LDH 136 10/30/2021    STUDIES:   MM DIAG BREAST TOMO BILATERAL Result Date: 02/03/2024 CLINICAL DATA:  Bilateral areas of concern in the outer breasts. History of small cell B-cell lymphoma diagnosed in 2017. Progression of lymphadenopathy by exam and CT in May 2025. On oral chemotherapy. EXAM: DIGITAL DIAGNOSTIC BILATERAL MAMMOGRAM WITH TOMOSYNTHESIS AND CAD; ULTRASOUND RIGHT BREAST LIMITED; ULTRASOUND LEFT BREAST LIMITED TECHNIQUE: Bilateral digital diagnostic mammography and breast tomosynthesis was performed. The images were evaluated with computer-aided detection. ; Targeted ultrasound  examination of the right breast was performed; Targeted ultrasound examination of the left breast was performed. COMPARISON:  Previous exam(s). ACR Breast Density Category c: The breasts are  heterogeneously dense, which may obscure small masses. FINDINGS: Diagnostic tomosynthesis views were obtained over the palpable area of concern in the RIGHT breast. No suspicious mammographic finding is identified in this area. Diagnostic tomosynthesis views were obtained over the palpable area of concern in the LEFT breast. No suspicious mammographic finding is identified in this area. Bilateral axillary adenopathy has decreased since January 2025, consistent with interval chemotherapy response for known lymphoma. No additional suspicious mass, microcalcification, or other finding is identified in either breast. On physical exam, no suspicious mass is appreciated. Patient endorses significant weight loss. Targeted RIGHT breast ultrasound was performed in the palpable area of concern at the upper outer breast. No suspicious solid or cystic mass is identified. Targeted LEFT breast ultrasound was performed in the palpable area of concern at upper outer breast. No suspicious solid or cystic mass is identified. IMPRESSION: 1. No mammographic or sonographic evidence of malignancy the sites of palpable concern bilaterally Any further workup of the patient's symptoms should be based on the clinical assessment. Recommend routine annual screening mammogram in 1 year. 2. Interval decrease in size of bilateral axillary adenopathy, consistent with treatment response for known lymphoma. 3. No mammographic evidence of mammary malignancy bilaterally. RECOMMENDATION: Screening mammogram in one year.(Code:SM-B-01Y) I have discussed the findings and recommendations with the patient. If applicable, a reminder letter will be sent to the patient regarding the next appointment. BI-RADS CATEGORY  2: Benign. Electronically Signed   By: Corean Salter M.D.   On: 02/03/2024 10:19   US  LIMITED ULTRASOUND INCLUDING AXILLA LEFT BREAST  Result Date: 02/03/2024 CLINICAL DATA:  Bilateral areas of concern in the outer breasts. History of  small cell B-cell lymphoma diagnosed in 2017. Progression of lymphadenopathy by exam and CT in May 2025. On oral chemotherapy. EXAM: DIGITAL DIAGNOSTIC BILATERAL MAMMOGRAM WITH TOMOSYNTHESIS AND CAD; ULTRASOUND RIGHT BREAST LIMITED; ULTRASOUND LEFT BREAST LIMITED TECHNIQUE: Bilateral digital diagnostic mammography and breast tomosynthesis was performed. The images were evaluated with computer-aided detection. ; Targeted ultrasound examination of the right breast was performed; Targeted ultrasound examination of the left breast was performed. COMPARISON:  Previous exam(s). ACR Breast Density Category c: The breasts are heterogeneously dense, which may obscure small masses. FINDINGS: Diagnostic tomosynthesis views were obtained over the palpable area of concern in the RIGHT breast. No suspicious mammographic finding is identified in this area. Diagnostic tomosynthesis views were obtained over the palpable area of concern in the LEFT breast. No suspicious mammographic finding is identified in this area. Bilateral axillary adenopathy has decreased since January 2025, consistent with interval chemotherapy response for known lymphoma. No additional suspicious mass, microcalcification, or other finding is identified in either breast. On physical exam, no suspicious mass is appreciated. Patient endorses significant weight loss. Targeted RIGHT breast ultrasound was performed in the palpable area of concern at the upper outer breast. No suspicious solid or cystic mass is identified. Targeted LEFT breast ultrasound was performed in the palpable area of concern at upper outer breast. No suspicious solid or cystic mass is identified. IMPRESSION: 1. No mammographic or sonographic evidence of malignancy the sites of palpable concern bilaterally Any further workup of the patient's symptoms should be based on the clinical assessment. Recommend routine annual screening mammogram in 1 year. 2. Interval decrease in size of bilateral  axillary adenopathy, consistent with treatment response for known lymphoma.  3. No mammographic evidence of mammary malignancy bilaterally. RECOMMENDATION: Screening mammogram in one year.(Code:[id number]-01Y) I have discussed the findings and recommendations with the patient. If applicable, a reminder letter will be sent to the patient regarding the next appointment. BI-RADS CATEGORY  2: Benign. Electronically Signed   By: Corean Salter M.D.   On: 02/03/2024 10:19   US  LIMITED ULTRASOUND INCLUDING AXILLA RIGHT BREAST Result Date: 02/03/2024 CLINICAL DATA:  Bilateral areas of concern in the outer breasts. History of small cell B-cell lymphoma diagnosed in 2017. Progression of lymphadenopathy by exam and CT in May 2025. On oral chemotherapy. EXAM: DIGITAL DIAGNOSTIC BILATERAL MAMMOGRAM WITH TOMOSYNTHESIS AND CAD; ULTRASOUND RIGHT BREAST LIMITED; ULTRASOUND LEFT BREAST LIMITED TECHNIQUE: Bilateral digital diagnostic mammography and breast tomosynthesis was performed. The images were evaluated with computer-aided detection. ; Targeted ultrasound examination of the right breast was performed; Targeted ultrasound examination of the left breast was performed. COMPARISON:  Previous exam(s). ACR Breast Density Category c: The breasts are heterogeneously dense, which may obscure small masses. FINDINGS: Diagnostic tomosynthesis views were obtained over the palpable area of concern in the RIGHT breast. No suspicious mammographic finding is identified in this area. Diagnostic tomosynthesis views were obtained over the palpable area of concern in the LEFT breast. No suspicious mammographic finding is identified in this area. Bilateral axillary adenopathy has decreased since January 2025, consistent with interval chemotherapy response for known lymphoma. No additional suspicious mass, microcalcification, or other finding is identified in either breast. On physical exam, no suspicious mass is appreciated. Patient endorses  significant weight loss. Targeted RIGHT breast ultrasound was performed in the palpable area of concern at the upper outer breast. No suspicious solid or cystic mass is identified. Targeted LEFT breast ultrasound was performed in the palpable area of concern at upper outer breast. No suspicious solid or cystic mass is identified. IMPRESSION: 1. No mammographic or sonographic evidence of malignancy the sites of palpable concern bilaterally Any further workup of the patient's symptoms should be based on the clinical assessment. Recommend routine annual screening mammogram in 1 year. 2. Interval decrease in size of bilateral axillary adenopathy, consistent with treatment response for known lymphoma. 3. No mammographic evidence of mammary malignancy bilaterally. RECOMMENDATION: Screening mammogram in one year.(Code:[id number]-01Y) I have discussed the findings and recommendations with the patient. If applicable, a reminder letter will be sent to the patient regarding the next appointment. BI-RADS CATEGORY  2: Benign. Electronically Signed   By: Corean Salter M.D.   On: 02/03/2024 10:19      HISTORY:   Past Medical History:  Diagnosis Date   Allergy    seasonal   Arthritis    Bipolar 1 disorder (HCC)    Cancer (HCC)    non hodgkins lymphoma   Depression    GERD (gastroesophageal reflux disease)    History of degenerative disc disease    Hyperlipidemia    Increased risk of breast cancer 04/25/2021   Obstructive sleep apnea    Osteopenia    Scoliosis    Thyroid  disease    hypothyroidism   Urticaria    Vitamin D deficiency     Past Surgical History:  Procedure Laterality Date   BREAST BIOPSY     BUBBLE STUDY  02/13/2021   Procedure: BUBBLE STUDY;  Surgeon: Raford Riggs, MD;  Location: [facility] ENDOSCOPY;  Service: Cardiovascular;;   CARPAL TUNNEL RELEASE Bilateral 2002   CESAREAN SECTION     x2   COLONOSCOPY  07/26/2008   Melanosis coli. Small internal hemorrhoids.  ENDOSCOPIC PLANTAR  FASCIOTOMY     ESOPHAGOGASTRODUODENOSCOPY  03/17/2013   Mild gastritis. Status post esophageal dilatation.   LYMPH NODE BIOPSY     right hip repair torn tendon Right 09/2021   TEE WITHOUT CARDIOVERSION N/A 02/13/2021   Procedure: TRANSESOPHAGEAL ECHOCARDIOGRAM (TEE);  Surgeon: Raford Riggs, MD;  Location: [facility] ENDOSCOPY;  Service: Cardiovascular;  Laterality: N/A;   WISDOM TOOTH EXTRACTION      Family History  Problem Relation Age of Onset   Prostate cancer Father 93   Melanoma Father 66   High blood pressure Father    Breast cancer Maternal Aunt    Multiple myeloma Maternal Aunt    Breast cancer Maternal Aunt    Colon cancer Neg Hx    Colon polyps Neg Hx    Esophageal cancer Neg Hx    Rectal cancer Neg Hx    Stomach cancer Neg Hx     Social History:  reports that she has never smoked. She has never used smokeless tobacco. She reports that she does not currently use alcohol. She reports that she does not use drugs.The patient is accompanied by her husband today.  Allergies:  Allergies  Allergen Reactions   Diclofenac Sodium Other (See Comments)    Thought it had something to do with her having a stroke   Voltaren [Diclofenac Sodium]     Thought it had something to do with her having a stroke   Nsaids Other (See Comments)    Had a stroke and worries it was related to the Diclofenac she took.    Current Medications: Current Outpatient Medications  Medication Sig Dispense Refill   pregabalin (LYRICA) 75 MG capsule Take 75 mg by mouth 2 (two) times daily. (Patient taking differently: Take 75 mg by mouth at bedtime. Original order was for BID but makes patient sleepy, so patient only takes it at night)     ramipril  (ALTACE ) 5 MG capsule Take 5 mg by mouth 2 (two) times daily.     Ascorbic Acid (VITAMIN C) 1000 MG tablet Take 1,000 mg by mouth 2 (two) times daily.     atorvastatin  (LIPITOR) 40 MG tablet Take 40 mg by mouth daily.     estradiol (ESTRACE) 0.1 MG/GM vaginal  cream 2 (two) times a week.     FLUoxetine  HCl (PROZAC  PO) Take 60 mg by mouth daily.     hydrochlorothiazide (HYDRODIURIL) 12.5 MG tablet Take 12.5 mg by mouth every morning.     Multiple Vitamin (MULTIVITAMIN ADULT PO) Take by mouth. Vision MD once a day     ondansetron  (ZOFRAN -ODT) 4 MG disintegrating tablet Take 1 tablet (4 mg total) by mouth every 8 (eight) hours as needed for nausea or vomiting. 20 tablet 2   PLAVIX  75 MG tablet Take 75 mg by mouth daily.     Probiotic Product (PROBIOTIC BLEND PO) Take by mouth at bedtime.     prochlorperazine  (COMPAZINE ) 10 MG tablet Take 1 tablet (10 mg total) by mouth every 6 (six) hours as needed for nausea or vomiting. 30 tablet 5   venetoclax  (VENCLEXTA ) 100 MG tablet Take 2 tablets (200 mg total) by mouth daily. Tablets should be swallowed whole with a meal and a full glass of water. 60 tablet 5   No current facility-administered medications for this visit.   Dawn VEAR Cornish, MD  [facility] - A DEPT OF [facility]. [facility] [address] [place] [address] Dept: [phone] Dept  Fax: [phone]   I,Alixis Harmon H Keeton Kassebaum,acting as a scribe for Dawn VEAR Cornish, MD.,have documented all relevant documentation on the behalf of Dawn VEAR Cornish, MD,as directed by  Dawn VEAR Cornish, MD while in the presence of Dawn VEAR Cornish, MD.

## 2024-02-06 NOTE — Telephone Encounter (Signed)
 Patient has been scheduled for follow-up visit per 02/06/24 LOS.  Pt noted appt details on personal electronic device.

## 2024-02-09 ENCOUNTER — Telehealth: Payer: Self-pay

## 2024-02-09 NOTE — Telephone Encounter (Signed)
 Pt asking if she can go ahead and take flu vaccine or wait?

## 2024-02-10 ENCOUNTER — Ambulatory Visit: Attending: Obstetrics and Gynecology

## 2024-02-10 ENCOUNTER — Other Ambulatory Visit: Payer: Self-pay

## 2024-02-10 DIAGNOSIS — M62838 Other muscle spasm: Secondary | ICD-10-CM | POA: Insufficient documentation

## 2024-02-10 DIAGNOSIS — R293 Abnormal posture: Secondary | ICD-10-CM | POA: Insufficient documentation

## 2024-02-10 DIAGNOSIS — M6281 Muscle weakness (generalized): Secondary | ICD-10-CM | POA: Diagnosis not present

## 2024-02-10 DIAGNOSIS — R279 Unspecified lack of coordination: Secondary | ICD-10-CM | POA: Diagnosis not present

## 2024-02-10 DIAGNOSIS — R102 Pelvic and perineal pain: Secondary | ICD-10-CM | POA: Insufficient documentation

## 2024-02-10 NOTE — Therapy (Signed)
 OUTPATIENT PHYSICAL THERAPY FEMALE PELVIC EVALUATION   Patient Name: Dawn Prince MRN: [id number] DOB:19-Mar-1961, 63 y.o., female Today's Date: 02/10/2024  END OF SESSION:  PT End of Session - 02/10/24 1011     Visit Number 1    Date for PT Re-Evaluation 05/04/24    Authorization Type [employer] Medicare    Authorization Time Period auth req    Progress Note Due on Visit 10    PT Start Time 1012    PT Stop Time 1055    PT Time Calculation (min) 43 min    Activity Tolerance Patient tolerated treatment well    Behavior During Therapy WFL for tasks assessed/performed          Past Medical History:  Diagnosis Date   Allergy    seasonal   Arthritis    Bipolar 1 disorder (HCC)    Cancer (HCC)    non hodgkins lymphoma   Depression    GERD (gastroesophageal reflux disease)    History of degenerative disc disease    Hyperlipidemia    Increased risk of breast cancer 04/25/2021   Obstructive sleep apnea    Osteopenia    Scoliosis    Thyroid  disease    hypothyroidism   Urticaria    Vitamin D deficiency    Past Surgical History:  Procedure Laterality Date   BREAST BIOPSY     BUBBLE STUDY  02/13/2021   Procedure: BUBBLE STUDY;  Surgeon: Raford Riggs, MD;  Location: [facility];  Service: Cardiovascular;;   CARPAL TUNNEL RELEASE Bilateral 2002   CESAREAN SECTION     x2   COLONOSCOPY  07/26/2008   Melanosis coli. Small internal hemorrhoids.    ENDOSCOPIC PLANTAR FASCIOTOMY     ESOPHAGOGASTRODUODENOSCOPY  03/17/2013   Mild gastritis. Status post esophageal dilatation.   LYMPH NODE BIOPSY     right hip repair torn tendon Right 09/2021   TEE WITHOUT CARDIOVERSION N/A 02/13/2021   Procedure: TRANSESOPHAGEAL ECHOCARDIOGRAM (TEE);  Surgeon: Raford Riggs, MD;  Location: [facility] ENDOSCOPY;  Service: Cardiovascular;  Laterality: N/A;   WISDOM TOOTH EXTRACTION     Patient Active Problem List   Diagnosis Date Noted   Breast mass in female 01/23/2024   Drug-induced  neutropenia (HCC) 11/13/2023   Thrombocytopenia (HCC) 10/15/2023   Non-Hodgkin's lymphoma (HCC) 10/29/2022   B12 deficiency anemia 07/23/2021    Class: Diagnosis of   Abnormal transaminases 04/27/2021   Nausea without vomiting 04/26/2021   Increased risk of breast cancer 04/25/2021   Acute CVA (cerebrovascular accident) (HCC) 02/08/2021   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   GERD (gastroesophageal reflux disease) 06/20/2020   Hypothyroidism 06/20/2020   Sciatica 06/20/2020   Osteopenia 06/20/2020   Vitamin D deficiency 06/20/2020   Hyperlipidemia 06/20/2020   H/O degenerative disc disease 06/20/2020   Chronic lymphocytic leukemia (CLL), B-cell (HCC) 10/31/2015    Class: Chronic    PCP: Jefferey Fitch, MD  REFERRING PROVIDER: Mat Browning, MD   REFERRING DIAG: N94.10 (ICD-10-CM) - Unspecified dyspareunia  THERAPY DIAG:  Pelvic pain  Muscle weakness (generalized)  Abnormal posture  Unspecified lack of coordination  Other muscle spasm  Rationale for Evaluation and Treatment: Rehabilitation  ONSET DATE: 02/2023 (last attempt)  SUBJECTIVE:  SUBJECTIVE STATEMENT: Pt states that she has had pain with intercourse for a very long time. She has had the antigua and barbuda treatment and oral and topical estrogen. She has been doing vitamin E suppositories. She feels like issues started about 5 years ago.    PAIN:  Are you having pain? Yes NPRS scale: 10/10 Pain location: vaginal - feels like it starts at the entrance, but then all throughout   Pain type: burning, something is on fire Pain description: intermittent   Aggravating factors: vaginal penetration, intercourse Relieving factors: no vaginal penetration   PRECAUTIONS: Other: high risk for breast  cancer, lymphoma, leukemia   RED FLAGS: None   WEIGHT BEARING RESTRICTIONS: No  FALLS:  Has patient fallen in last 6 months? No  OCCUPATION: reitred   ACTIVITY LEVEL : no - she has been having issues with low back and Rt hip (just had steroid injection in hip last week that has helped some)  PLOF: Independent  PATIENT GOALS: to have less pain with intercourse   PERTINENT HISTORY:  Non hodkins lymphoma, GERD, sleep apnea, osteopenia, scoliosis, hypothyroidism, hx CVA, chronic lymphocytic leukemia Sexual abuse: No  BOWEL MOVEMENT: Pain with bowel movement: No Type of bowel movement:Frequency 1x/day and Strain some Fully empty rectum: Yes:   Leakage: No Pads: No Fiber supplement/laxative eats prunes every night  URINATION: Pain with urination: No Fully empty bladder: Yes:   Stream: Strong Urgency: Yes  Frequency: every 2 hours, every 2 hours at night Fluid Intake: supposed to drink 56oz of fluid a day Leakage: none Pads: No  INTERCOURSE:  Ability to have vaginal penetration No  Pain with intercourse: Initial Penetration, During Penetration, and Deep Penetration DrynessYes  Climax: always been a problem - even with clitoral stimulation  Marinoff Scale: 3/3 Lubricant: yes, been using [place]  PREGNANCY: Vaginal deliveries 0 C-section deliveries 2 Currently pregnant No  PROLAPSE: Pressure   OBJECTIVE:  Note: Objective measures were completed at Evaluation unless otherwise noted.  02/10/24: PATIENT SURVEYS:   PFIQ-7: 39 (would also benefit from FSFI)  COGNITION: Overall cognitive status: Within functional limits for tasks assessed     SENSATION: Light touch: Appears intact   FUNCTIONAL TESTS:  Single leg stance:  Rt: pelvic drop  Lt: pelvic drop Curl-up test: upper abdominal distortion    GAIT: Assistive device utilized: None Comments: forward flexed posture  POSTURE: rounded shoulders, forward head, decreased lumbar lordosis, increased thoracic  kyphosis, posterior pelvic tilt, and scoliosis   LUMBARAROM/PROM:  A/PROM A/PROM  Eval (% available)  Flexion 75  Extension 50  Right lateral flexion 50  Left lateral flexion 50  Right rotation 50  Left rotation 50   (Blank rows = not tested)  PALPATION:   General: tightness in bil lumbar paraspinals   Pelvic Alignment: posterior pelvic tilt   Abdominal: some increase in c-section scar tissue restriction in Lt side of scar; apical breathing patter nwith poor abdominal expansion                External Perineal Exam: pale, dry                             Internal Pelvic Floor: burning and stenosis throughout superficial layers   Patient confirms identification and approves PT to assess internal pelvic floor and treatment Yes  PELVIC MMT:   MMT eval  Vaginal 4/5, 4 seconds, 7 repeats   Diastasis Recti 2 finger widths   (Blank rows =  not tested)        TONE: low  PROLAPSE: WNL  TODAY'S TREATMENT:                                                                                                                              DATE:  02/10/24: EVAL  Neuromuscular re-education: Pt provides verbal consent for internal vaginal/rectal pelvic floor exam. Internal vaginal pelvic floor muscle contraction training Quick flicks  Therapeutic activities: Lubricants - samples given  Vaginal moisturizers - daily  Vibrator use  Initial dilator discussion     PATIENT EDUCATION:  Education details: See above Person educated: Patient Education method: Explanation, Demonstration, Tactile cues, Verbal cues, and Handouts Education comprehension: verbalized understanding  HOME EXERCISE PROGRAM: [id number]  ASSESSMENT:  CLINICAL IMPRESSION: Patient is a 63 y.o. female who was seen today for physical therapy evaluation and treatment for pain with intercourse. Exam findings notable for abnormal posture, reduced lumbar A/ROM, core weakness, mild lower abdominal scar tissue restriction,  dryness in vulva, introital stenosis, low tone pelvic floor muscles, some vaginal stenosis, and burning throughout superficial muscular layers of pelvic floor muscles. Signs and symptoms are most consistent with vaginal atrophy, reduced pelvic floor muscle mobility, and decreased deep core strength; believe working on strengthening will help improve strength, mobility, and moisture levels in pelvic floor muscles. We discussed benefit of regular vaginal moisturizing, using high quality lubricants, and use of vibrator to help desensitize muscles; we also briefly started discussing dilators and will continue this education next session. She will continue to benefit from skilled PT intervention in order to decrease pain with intercourse and improve intimate relationship with partner.   OBJECTIVE IMPAIRMENTS: decreased activity tolerance, decreased coordination, decreased endurance, decreased mobility, decreased ROM, decreased strength, increased fascial restrictions, increased muscle spasms, impaired flexibility, impaired tone, improper body mechanics, postural dysfunction, and pain.   ACTIVITY LIMITATIONS: complete evacuation with bowel movements   PARTICIPATION LIMITATIONS: interpersonal relationship  PERSONAL FACTORS: 3+ comorbidities: medical history are also affecting patient's functional outcome.   REHAB POTENTIAL: Good  CLINICAL DECISION MAKING: Evolving/moderate complexity  EVALUATION COMPLEXITY: Moderate   GOALS: Goals reviewed with patient? Yes  SHORT TERM GOALS: Target date: 03/09/2024   Pt will be independent with HEP in order to improve activity tolerance.   Baseline: Goal status: INITIAL  2.  Pt will be independent with daily vulvar moisturizing to improve vulvar tissue comfort.  Baseline: not using Goal status: INITIAL  3.  Pt will begin dilator program with help of husband in order to decrease vaginal hypersensitivity in order to return to intercourse.   Baseline: not  using Goal status: INITIAL  4.  Pt will be able to perform two finger vaginal insertion without increase in vaginal pain in order to work towards comfortable intercourse with partner. Baseline: 10/10 pain with any vaginal insertion Goal status: INITIAL  5.  Pt will be independent with diaphragmatic breathing and down training activities in order to improve pelvic floor  relaxation.  Baseline: apical breathing pattern without appropriate abdominal expansion  Goal status: INITIAL  6. Pt will be independent with use of squatty potty, relaxed toileting mechanics, and improved bowel movement techniques in order to increase ease of bowel movements and complete evacuation.     Baseline: straining   Goal status: INITIAL   LONG TERM GOALS: Target date: 05/04/2024   Pt will be independent with advanced HEP in order to improve activity tolerance.   Baseline:  Goal status: INITIAL  2.  Pt will report 0/10 pain with vaginal penetration in order to improve intimate relationship with partner.    Baseline: 10/10 Goal status: INITIAL  3.  Pt will be independent with dilator progression in order to work towards pain free intercourse with husband.  Baseline: not using dilators and 10/10 pain  Goal status: INITIAL  4.  Pt will be able to have pain free orgasm in order to improve full range of motion in pelvic floor muscles and improve natural vaginal lubrication.  Baseline: unable Goal status: INITIAL  5.  Pt will report complete emptying with bowel movements, decrease fecal smearing, and no straining in order to decrease pressure on pelvic floor muscles and decrease risk of infection with smearing.  Baseline: difficulty getting clean after bowel movements, straining, incomplete emptying  Goal status: INITIAL   PLAN:  PT FREQUENCY: 1-2x/week  PT DURATION: 12 appointments    PLANNED INTERVENTIONS: 97164- PT Re-evaluation, 97110-Therapeutic exercises, 97530- Therapeutic activity, 97112-  Neuromuscular re-education, 97535- Self Care, 02859- Manual therapy, [phone]- Gait training, [phone]- Aquatic Therapy, [phone]- Electrical stimulation (unattended), [phone]- Traction (mechanical), [id number]- Ionotophoresis 4mg /ml Dexamethasone, 79439 (1-2 muscles), 20561 (3+ muscles)- Dry Needling, Patient/Family education, Balance training, Taping, Joint mobilization, Joint manipulation, Spinal manipulation, Spinal mobilization, Scar mobilization, Vestibular training, Cryotherapy, Moist heat, and Biofeedback  PLAN FOR NEXT SESSION: good toilet mechanics for bowel movements; bladder retraining to reduce nocturia; dilator education; progress pelvic floor muscle/core strengthening   Josette Mares, PT, DPT09/16/2511:37 AM

## 2024-02-10 NOTE — Patient Instructions (Addendum)
 Lubrication Used for intercourse to reduce friction Avoid ones that have glycerin, nonoxynol-9, petroleum, propylene glycol, chlorhexidine gluconate, warming gels, tingling gels, icing or cooling gel, scented Avoid parabens due to a preservative similar to female sex hormone May need to be reapplied once or several times during sexual activity Can be applied to both partners genitals prior to vaginal penetration to minimize friction or irritation Prevent irritation and mucosal tears that cause post coital pain and increased the risk of vaginal and urinary tract infections Oil-based lubricants cannot be used with condoms due to breaking them down.  Least likely to irritate vaginal tissue.  Plant based-lubes are safe Silicone-based lubrication are thicker and last long and used for post-menopausal women  Vaginal Lubricators Here is a list of some suggested lubricators you can use for intercourse. Use the most hypoallergenic product.  You can place on you or your partner.  Slippery Stuff ( water based) Sylk or Sliquid Natural H2O ( good  if frequent UTI's)- walmart, amazon Sliquid organics silk-(aloe and silicone based ) Blossom Organics (www.blossom-organics.com)- (aloe based ) Coconut oil, olive oil -not good with condoms  PJur Woman Nude- (water based) amazon Uberlube- ( silicon) Amazon Aloe Vera- Sprouts has an organic one Yes lubricant- (water based and has plant oil based similar to silicone) Loews [employer] Platinum-Silicone, Target, [facility] Olive and Bee intimate cream-  www.oliveandbee.com.au Pink - International Paper Erosense Sync- walmart, amazon Coconu- coconu.com Desert [employer] Good Clean Love lubricants  Things to avoid in lubricants are glycerin, warming gels, tingling gels, icing or cooling  gels, and scented gels.  Also avoid Vaseline. KY jelly,  and Astroglide contain chlorhexidine which kills good bacteria(lactobacilli)  Things to avoid in the vaginal area Do not use  things to irritate the vulvar area No lotions- see below Soaps you  can use :Aveeno, Calendula, Good Clean Love cleanser if needed. Must be gentle No deodorants No douches Good to sleep without underwear to let the vaginal area to air out No scrubbing: spread the lips to let warm water rinse over labias and pat dry  Creams that can be used on the Vulva Area V [employer], walmart Vital V Wild Yam Salve Julva- Amazon MoonMaid Botanical Pro-Meno Wild Yam Cream Coconut oil, olive oil Cleo by [employer] labial moisturizer -Amazon,  Desert Visteon Corporation Releveum ( lidocaine ) or Desert Fluor Corporation Yes Moisturizer        Moisturizers They are used in the vagina to hydrate the mucous membrane that make up the vaginal canal. Designed to keep a more normal acid balance (ph) Once placed in the vagina, it will last between two to three days.  Use 2-3 times per week at bedtime  Ingredients to avoid is glycerin and fragrance, can increase chance of infection Should not be used just before sex due to causing irritation Most are gels administered either in a tampon-shaped applicator or as a vaginal suppository. They are non-hormonal.   Types of Moisturizers(internal use)  Vitamin E vaginal suppositories- Whole foods, Amazon Moist Again Coconut oil- can break down condoms, any grocery store (prefer organic) Julva- (Do no use if taking  Tamoxifen) amazon Yes moisturizer- amazon NeuEve Silk , NeuEve Silver for menopausal or over 65 (if have severe vaginal atrophy or cancer treatments use NeuEve Silk for  1 month than move to [employer])- [employer], [place].com Olive and Bee intimate cream- www.oliveandbee.com.au Mae vaginal moisturizer- Amazon Aloe Good Clean Love Hyaluronic acid Hyalofemme Reveree hyaluronic acid inserts   Creams to use externally on the  Vulva area Marathon Oil (good for for cancer patients that had radiation to the area)- amazon or [employer]bermaid.https://garcia-[email or web address]/ Vulva Balm/  V-magic cream by medicine mama- amazon Julva-amazon Vital V Wild Yam salve ( help moisturize and help with thinning vulvar area, does have Beeswax MoodMaid Botanical Pro-Meno Wild Yam Cream- Amazon Desert Harvest Gele Cleo by Sherrlyn labial moisturizer (Amazon),  Coconut or olive oil aloe Good Clean Love Enchanted Rose by intimate rose  Things to avoid in the vaginal area Do not use things to irritate the vulvar area No lotions just specialized creams for the vulva area- Neogyn, V-magic,  No soaps; can use Aveeno or Calendula cleanser, unscented Dove if needed. Must be gentle No deodorants No douches Good to sleep without underwear to let the vaginal area to air out No scrubbing: spread the lips to let warm water rinse over labias and pat dry    Start using vibrator just at vaginal opening or clitoris just to help decrease pain and improve circulation.    Vulvar/vaginal Massage: This is a technique to help decrease painful sensitivity in the vaginal area. It can also help to restore normal moisture levels in the vaginal tissues. With coconut oil, aloe, jojoba oil, or a specific vaginal moisturizer, gently massage into vaginal tissues. Think of this as part of your post-shower routine and moisturizing just like you would the rest of the body with lotion. This helps to increase good blood flow to the vaginal tissues. In addition, it also teaches the body that touch to the vagina does not have to be painful or threatening, but moisturizing and gentle.    [facility] [address], [address] [place], [place] [address] Phone # [phone] Fax [phone]

## 2024-02-13 ENCOUNTER — Encounter: Payer: Self-pay | Admitting: Oncology

## 2024-02-18 ENCOUNTER — Other Ambulatory Visit: Payer: Self-pay

## 2024-02-19 ENCOUNTER — Other Ambulatory Visit: Payer: Self-pay

## 2024-02-19 ENCOUNTER — Ambulatory Visit

## 2024-02-19 DIAGNOSIS — M6281 Muscle weakness (generalized): Secondary | ICD-10-CM | POA: Diagnosis not present

## 2024-02-19 DIAGNOSIS — R293 Abnormal posture: Secondary | ICD-10-CM | POA: Diagnosis not present

## 2024-02-19 DIAGNOSIS — R279 Unspecified lack of coordination: Secondary | ICD-10-CM | POA: Diagnosis not present

## 2024-02-19 DIAGNOSIS — R102 Pelvic and perineal pain: Secondary | ICD-10-CM

## 2024-02-19 DIAGNOSIS — M62838 Other muscle spasm: Secondary | ICD-10-CM | POA: Diagnosis not present

## 2024-02-19 NOTE — Therapy (Signed)
 OUTPATIENT PHYSICAL THERAPY FEMALE PELVIC TREATMENT   Patient Name: Dawn Prince MRN: [id number] DOB:1960/06/01, 63 y.o., female Today's Date: 02/19/2024  END OF SESSION:  PT End of Session - 02/19/24 1446     Visit Number 2    Date for Recertification  05/04/24    Authorization Type [employer]    Authorization Time Period 02/10/24-04/06/24    Authorization - Visit Number 1    Authorization - Number of Visits 16    Progress Note Due on Visit 10    PT Start Time 1446    PT Stop Time 1527    PT Time Calculation (min) 41 min    Activity Tolerance Patient tolerated treatment well    Behavior During Therapy [facility] for tasks assessed/performed          Past Medical History:  Diagnosis Date   Allergy    seasonal   Arthritis    Bipolar 1 disorder (HCC)    Cancer (HCC)    non hodgkins lymphoma   Depression    GERD (gastroesophageal reflux disease)    History of degenerative disc disease    Hyperlipidemia    Increased risk of breast cancer 04/25/2021   Obstructive sleep apnea    Osteopenia    Scoliosis    Thyroid  disease    hypothyroidism   Urticaria    Vitamin D deficiency    Past Surgical History:  Procedure Laterality Date   BREAST BIOPSY     BUBBLE STUDY  02/13/2021   Procedure: BUBBLE STUDY;  Surgeon: Raford Riggs, MD;  Location: [facility] ENDOSCOPY;  Service: Cardiovascular;;   CARPAL TUNNEL RELEASE Bilateral 2002   CESAREAN SECTION     x2   COLONOSCOPY  07/26/2008   Melanosis coli. Small internal hemorrhoids.    ENDOSCOPIC PLANTAR FASCIOTOMY     ESOPHAGOGASTRODUODENOSCOPY  03/17/2013   Mild gastritis. Status post esophageal dilatation.   LYMPH NODE BIOPSY     right hip repair torn tendon Right 09/2021   TEE WITHOUT CARDIOVERSION N/A 02/13/2021   Procedure: TRANSESOPHAGEAL ECHOCARDIOGRAM (TEE);  Surgeon: Raford Riggs, MD;  Location: [facility] ENDOSCOPY;  Service: Cardiovascular;  Laterality: N/A;   WISDOM TOOTH EXTRACTION     Patient Active Problem  List   Diagnosis Date Noted   Breast mass in female 01/23/2024   Drug-induced neutropenia 11/13/2023   Thrombocytopenia 10/15/2023   Non-Hodgkin's lymphoma (HCC) 10/29/2022   B12 deficiency anemia 07/23/2021    Class: Diagnosis of   Abnormal transaminases 04/27/2021   Nausea without vomiting 04/26/2021   Increased risk of breast cancer 04/25/2021   Acute CVA (cerebrovascular accident) (HCC) 02/08/2021   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   GERD (gastroesophageal reflux disease) 06/20/2020   Hypothyroidism 06/20/2020   Sciatica 06/20/2020   Osteopenia 06/20/2020   Vitamin D deficiency 06/20/2020   Hyperlipidemia 06/20/2020   H/O degenerative disc disease 06/20/2020   Chronic lymphocytic leukemia (CLL), B-cell (HCC) 10/31/2015    Class: Chronic    PCP: Jefferey Fitch, MD  REFERRING PROVIDER: Mat Browning, MD   REFERRING DIAG: N94.10 (ICD-10-CM) - Unspecified dyspareunia  THERAPY DIAG:  Pelvic pain  Muscle weakness (generalized)  Abnormal posture  Unspecified lack of coordination  Other muscle spasm  Rationale for Evaluation and Treatment: Rehabilitation  ONSET DATE: 02/2023 (last attempt)  SUBJECTIVE:  SUBJECTIVE STATEMENT: Pt states that they had a good session working together last session. They were able to use vibrator. They did have intercourse without full insertion and it caused less pain than it has been.    PAIN:  Are you having pain? Yes NPRS scale: 10/10 Pain location: vaginal - feels like it starts at the entrance, but then all throughout   Pain type: burning, something is on fire Pain description: intermittent   Aggravating factors: vaginal penetration, intercourse Relieving factors: no vaginal penetration    PRECAUTIONS: Other: high risk for breast cancer, lymphoma, leukemia   RED FLAGS: None   WEIGHT BEARING RESTRICTIONS: No  FALLS:  Has patient fallen in last 6 months? No  OCCUPATION: reitred   ACTIVITY LEVEL : no - she has been having issues with low back and Rt hip (just had steroid injection in hip last week that has helped some)  PLOF: Independent  PATIENT GOALS: to have less pain with intercourse   PERTINENT HISTORY:  Non hodkins lymphoma, GERD, sleep apnea, osteopenia, scoliosis, hypothyroidism, hx CVA, chronic lymphocytic leukemia Sexual abuse: No  BOWEL MOVEMENT: Pain with bowel movement: No Type of bowel movement:Frequency 1x/day and Strain some Fully empty rectum: Yes:   Leakage: No Pads: No Fiber supplement/laxative eats prunes every night  URINATION: Pain with urination: No Fully empty bladder: Yes:   Stream: Strong Urgency: Yes  Frequency: every 2 hours, every 2 hours at night Fluid Intake: supposed to drink 56oz of fluid a day Leakage: none Pads: No  INTERCOURSE:  Ability to have vaginal penetration No  Pain with intercourse: Initial Penetration, During Penetration, and Deep Penetration DrynessYes  Climax: always been a problem - even with clitoral stimulation  Marinoff Scale: 3/3 Lubricant: yes, been using [place]  PREGNANCY: Vaginal deliveries 0 C-section deliveries 2 Currently pregnant No  PROLAPSE: Pressure   OBJECTIVE:  Note: Objective measures were completed at Evaluation unless otherwise noted.  02/10/24: PATIENT SURVEYS:   PFIQ-7: 12 (would also benefit from FSFI)  COGNITION: Overall cognitive status: Within functional limits for tasks assessed     SENSATION: Light touch: Appears intact   FUNCTIONAL TESTS:  Single leg stance:  Rt: pelvic drop  Lt: pelvic drop Curl-up test: upper abdominal distortion    GAIT: Assistive device utilized: None Comments: forward flexed posture  POSTURE: rounded shoulders, forward head,  decreased lumbar lordosis, increased thoracic kyphosis, posterior pelvic tilt, and scoliosis   LUMBARAROM/PROM:  A/PROM A/PROM  Eval (% available)  Flexion 75  Extension 50  Right lateral flexion 50  Left lateral flexion 50  Right rotation 50  Left rotation 50   (Blank rows = not tested)  PALPATION:   General: tightness in bil lumbar paraspinals   Pelvic Alignment: posterior pelvic tilt   Abdominal: some increase in c-section scar tissue restriction in Lt side of scar; apical breathing patter nwith poor abdominal expansion                External Perineal Exam: pale, dry                             Internal Pelvic Floor: burning and stenosis throughout superficial layers   Patient confirms identification and approves PT to assess internal pelvic floor and treatment Yes  PELVIC MMT:   MMT eval  Vaginal 4/5, 4 seconds, 7 repeats   Diastasis Recti 2 finger widths   (Blank rows = not tested)  TONE: low  PROLAPSE: WNL  TODAY'S TREATMENT:                                                                                                                              DATE:  02/19/24 Therapeutic activities: Pt education: Dilators: different brands Dilator program, exercises, progression, lubricants Purpose of dilators for desensitization, avoiding increases in pain, what to do when they do hurt, ending on positive note with non-painful dilator Use of applicator for estradiol: using vibrator, lubricant, partner help with stretching first Lubricant review and what to avoid with dilators and vibrator Vaginal moisturizer review Benefit of larger number of dilators in set for high sensitivity levels  Benefit of regular exercise to help with overall fatigue and how she is feeling on chemo meds (150 min/week of moderate intensity exercise) Aquatic therapy being a great option for her current functional level to exercise   02/10/24: EVAL  Neuromuscular re-education: Pt  provides verbal consent for internal vaginal/rectal pelvic floor exam. Internal vaginal pelvic floor muscle contraction training Quick flicks  Therapeutic activities: Lubricants - samples given  Vaginal moisturizers - daily  Vibrator use  Initial dilator discussion     PATIENT EDUCATION:  Education details: See above Person educated: Patient Education method: Explanation, Demonstration, Tactile cues, Verbal cues, and Handouts Education comprehension: verbalized understanding  HOME EXERCISE PROGRAM: [id number]  ASSESSMENT:  CLINICAL IMPRESSION: Patient is a 63 y.o. female who was seen today for physical therapy evaluation and treatment for pain with intercourse. Pt doing well with having had a positive intimate session with partner; she did have some pain, but it was improved and more enjoyable. She also feels like she may have been able to have orgasm. Extensive education performed on dilators, how to use and progress, benefit of exercise to help with energy levels, benefit of aquatics, lubricants, moisturizers, and estrogen applicator use. Believe all these items will be very helpful in decreasing vaginal hypersensitivity and working towards pain free intimacy. She will continue to benefit from skilled PT intervention in order to decrease pain with intercourse and improve intimate relationship with partner.   OBJECTIVE IMPAIRMENTS: decreased activity tolerance, decreased coordination, decreased endurance, decreased mobility, decreased ROM, decreased strength, increased fascial restrictions, increased muscle spasms, impaired flexibility, impaired tone, improper body mechanics, postural dysfunction, and pain.   ACTIVITY LIMITATIONS: complete evacuation with bowel movements   PARTICIPATION LIMITATIONS: interpersonal relationship  PERSONAL FACTORS: 3+ comorbidities: medical history are also affecting patient's functional outcome.   REHAB POTENTIAL: Good  CLINICAL DECISION MAKING:  Evolving/moderate complexity  EVALUATION COMPLEXITY: Moderate   GOALS: Goals reviewed with patient? Yes  SHORT TERM GOALS: Target date: 03/09/2024   Pt will be independent with HEP in order to improve activity tolerance.   Baseline: Goal status: INITIAL  2.  Pt will be independent with daily vulvar moisturizing to improve vulvar tissue comfort.  Baseline: not using Goal status: INITIAL  3.  Pt will begin dilator program with help of  husband in order to decrease vaginal hypersensitivity in order to return to intercourse.   Baseline: not using Goal status: INITIAL  4.  Pt will be able to perform two finger vaginal insertion without increase in vaginal pain in order to work towards comfortable intercourse with partner. Baseline: 10/10 pain with any vaginal insertion Goal status: INITIAL  5.  Pt will be independent with diaphragmatic breathing and down training activities in order to improve pelvic floor relaxation.  Baseline: apical breathing pattern without appropriate abdominal expansion  Goal status: INITIAL  6. Pt will be independent with use of squatty potty, relaxed toileting mechanics, and improved bowel movement techniques in order to increase ease of bowel movements and complete evacuation.     Baseline: straining   Goal status: INITIAL   LONG TERM GOALS: Target date: 05/04/2024   Pt will be independent with advanced HEP in order to improve activity tolerance.   Baseline:  Goal status: INITIAL  2.  Pt will report 0/10 pain with vaginal penetration in order to improve intimate relationship with partner.    Baseline: 10/10 Goal status: INITIAL  3.  Pt will be independent with dilator progression in order to work towards pain free intercourse with husband.  Baseline: not using dilators and 10/10 pain  Goal status: INITIAL  4.  Pt will be able to have pain free orgasm in order to improve full range of motion in pelvic floor muscles and improve natural  vaginal lubrication.  Baseline: unable Goal status: INITIAL  5.  Pt will report complete emptying with bowel movements, decrease fecal smearing, and no straining in order to decrease pressure on pelvic floor muscles and decrease risk of infection with smearing.  Baseline: difficulty getting clean after bowel movements, straining, incomplete emptying  Goal status: INITIAL   PLAN:  PT FREQUENCY: 1-2x/week  PT DURATION: 12 appointments    PLANNED INTERVENTIONS: 97164- PT Re-evaluation, 97110-Therapeutic exercises, 97530- Therapeutic activity, 97112- Neuromuscular re-education, 97535- Self Care, [id number]- Manual therapy, [phone]- Gait training, [phone]- Aquatic Therapy, [phone]- Electrical stimulation (unattended), [phone]- Traction (mechanical), [id number]- Ionotophoresis 4mg /ml Dexamethasone, 79439 (1-2 muscles), 20561 (3+ muscles)- Dry Needling, Patient/Family education, Balance training, Taping, Joint mobilization, Joint manipulation, Spinal manipulation, Spinal mobilization, Scar mobilization, Vestibular training, Cryotherapy, Moist heat, and Biofeedback  PLAN FOR NEXT SESSION: good toilet mechanics for bowel movements; bladder retraining to reduce nocturia; dilator education; progress pelvic floor muscle/core strengthening   Josette Mares, PT, DPT09/25/253:30 PM

## 2024-02-20 ENCOUNTER — Other Ambulatory Visit: Payer: Self-pay

## 2024-02-20 ENCOUNTER — Other Ambulatory Visit (HOSPITAL_COMMUNITY): Payer: Self-pay

## 2024-02-20 ENCOUNTER — Encounter (INDEPENDENT_AMBULATORY_CARE_PROVIDER_SITE_OTHER): Payer: Self-pay

## 2024-02-20 ENCOUNTER — Inpatient Hospital Stay

## 2024-02-20 DIAGNOSIS — Z79899 Other long term (current) drug therapy: Secondary | ICD-10-CM | POA: Diagnosis not present

## 2024-02-20 DIAGNOSIS — R7401 Elevation of levels of liver transaminase levels: Secondary | ICD-10-CM | POA: Diagnosis not present

## 2024-02-20 DIAGNOSIS — D709 Neutropenia, unspecified: Secondary | ICD-10-CM | POA: Diagnosis not present

## 2024-02-20 DIAGNOSIS — Z808 Family history of malignant neoplasm of other organs or systems: Secondary | ICD-10-CM | POA: Diagnosis not present

## 2024-02-20 DIAGNOSIS — Z8719 Personal history of other diseases of the digestive system: Secondary | ICD-10-CM | POA: Diagnosis not present

## 2024-02-20 DIAGNOSIS — I2510 Atherosclerotic heart disease of native coronary artery without angina pectoris: Secondary | ICD-10-CM | POA: Diagnosis not present

## 2024-02-20 DIAGNOSIS — M549 Dorsalgia, unspecified: Secondary | ICD-10-CM | POA: Diagnosis not present

## 2024-02-20 DIAGNOSIS — E039 Hypothyroidism, unspecified: Secondary | ICD-10-CM | POA: Diagnosis not present

## 2024-02-20 DIAGNOSIS — Z7902 Long term (current) use of antithrombotics/antiplatelets: Secondary | ICD-10-CM | POA: Diagnosis not present

## 2024-02-20 DIAGNOSIS — R110 Nausea: Secondary | ICD-10-CM | POA: Diagnosis not present

## 2024-02-20 DIAGNOSIS — R590 Localized enlarged lymph nodes: Secondary | ICD-10-CM | POA: Diagnosis not present

## 2024-02-20 DIAGNOSIS — C8302 Small cell B-cell lymphoma, intrathoracic lymph nodes: Secondary | ICD-10-CM | POA: Diagnosis not present

## 2024-02-20 DIAGNOSIS — M545 Low back pain, unspecified: Secondary | ICD-10-CM | POA: Diagnosis not present

## 2024-02-20 DIAGNOSIS — Z886 Allergy status to analgesic agent status: Secondary | ICD-10-CM | POA: Diagnosis not present

## 2024-02-20 DIAGNOSIS — M8580 Other specified disorders of bone density and structure, unspecified site: Secondary | ICD-10-CM | POA: Diagnosis not present

## 2024-02-20 DIAGNOSIS — D649 Anemia, unspecified: Secondary | ICD-10-CM | POA: Diagnosis not present

## 2024-02-20 DIAGNOSIS — Z807 Family history of other malignant neoplasms of lymphoid, hematopoietic and related tissues: Secondary | ICD-10-CM | POA: Diagnosis not present

## 2024-02-20 DIAGNOSIS — R5383 Other fatigue: Secondary | ICD-10-CM | POA: Diagnosis not present

## 2024-02-20 DIAGNOSIS — D6959 Other secondary thrombocytopenia: Secondary | ICD-10-CM | POA: Diagnosis not present

## 2024-02-20 DIAGNOSIS — N6310 Unspecified lump in the right breast, unspecified quadrant: Secondary | ICD-10-CM | POA: Diagnosis not present

## 2024-02-20 DIAGNOSIS — R0602 Shortness of breath: Secondary | ICD-10-CM | POA: Diagnosis not present

## 2024-02-20 DIAGNOSIS — E785 Hyperlipidemia, unspecified: Secondary | ICD-10-CM | POA: Diagnosis not present

## 2024-02-20 DIAGNOSIS — N6320 Unspecified lump in the left breast, unspecified quadrant: Secondary | ICD-10-CM | POA: Diagnosis not present

## 2024-02-20 DIAGNOSIS — Z803 Family history of malignant neoplasm of breast: Secondary | ICD-10-CM | POA: Diagnosis not present

## 2024-02-20 LAB — CBC WITH DIFFERENTIAL (CANCER CENTER ONLY)
Abs Immature Granulocytes: 0 K/uL (ref 0.00–0.07)
Basophils Absolute: 0 K/uL (ref 0.0–0.1)
Basophils Relative: 0 %
Eosinophils Absolute: 0 K/uL (ref 0.0–0.5)
Eosinophils Relative: 0 %
HCT: 41.1 % (ref 36.0–46.0)
Hemoglobin: 13.7 g/dL (ref 12.0–15.0)
Immature Granulocytes: 0 %
Lymphocytes Relative: 4 %
Lymphs Abs: 0.2 K/uL — ABNORMAL LOW (ref 0.7–4.0)
MCH: 33.3 pg (ref 26.0–34.0)
MCHC: 33.3 g/dL (ref 30.0–36.0)
MCV: 99.8 fL (ref 80.0–100.0)
Monocytes Absolute: 0.5 K/uL (ref 0.1–1.0)
Monocytes Relative: 14 %
Neutro Abs: 3 K/uL (ref 1.7–7.7)
Neutrophils Relative %: 82 %
Platelet Count: 185 K/uL (ref 150–400)
RBC: 4.12 MIL/uL (ref 3.87–5.11)
RDW: 13.2 % (ref 11.5–15.5)
WBC Count: 3.7 K/uL — ABNORMAL LOW (ref 4.0–10.5)
nRBC: 0 % (ref 0.0–0.2)

## 2024-02-20 LAB — CMP (CANCER CENTER ONLY)
ALT: 93 U/L — ABNORMAL HIGH (ref 0–44)
AST: 52 U/L — ABNORMAL HIGH (ref 15–41)
Albumin: 4.1 g/dL (ref 3.5–5.0)
Alkaline Phosphatase: 70 U/L (ref 38–126)
Anion gap: 13 (ref 5–15)
BUN: 17 mg/dL (ref 8–23)
CO2: 25 mmol/L (ref 22–32)
Calcium: 9.6 mg/dL (ref 8.9–10.3)
Chloride: 98 mmol/L (ref 98–111)
Creatinine: 0.75 mg/dL (ref 0.44–1.00)
GFR, Estimated: >60 mL/min (ref >60)
Glucose, Bld: 113 mg/dL — ABNORMAL HIGH (ref 70–99)
Potassium: 4.4 mmol/L (ref 3.5–5.1)
Sodium: 136 mmol/L (ref 135–145)
Total Bilirubin: 0.6 mg/dL (ref 0.0–1.2)
Total Protein: 7 g/dL (ref 6.5–8.1)

## 2024-02-20 NOTE — Progress Notes (Signed)
 Specialty Pharmacy Refill Coordination Note  Dawn Prince is a 63 y.o. female contacted today regarding refills of specialty medication(s) Venetoclax  (VENCLEXTA )   Patient requested (Patient-Rptd) Delivery   Delivery date: 02/24/24   Verified address: (Patient-Rptd) [address] [place], [place] [address]   Medication will be filled on 02/23/24.

## 2024-02-23 ENCOUNTER — Other Ambulatory Visit: Payer: Self-pay

## 2024-02-23 ENCOUNTER — Other Ambulatory Visit: Payer: Self-pay | Admitting: *Deleted

## 2024-02-23 MED ORDER — FAMOTIDINE 20 MG PO TABS: 20.0000 mg | ORAL_TABLET | Freq: Two times a day (BID) | ORAL | 0 refills | Status: AC

## 2024-02-24 DIAGNOSIS — M5116 Intervertebral disc disorders with radiculopathy, lumbar region: Secondary | ICD-10-CM | POA: Diagnosis not present

## 2024-02-24 DIAGNOSIS — M5416 Radiculopathy, lumbar region: Secondary | ICD-10-CM | POA: Diagnosis not present

## 2024-03-03 DIAGNOSIS — H47323 Drusen of optic disc, bilateral: Secondary | ICD-10-CM | POA: Diagnosis not present

## 2024-03-03 DIAGNOSIS — H2513 Age-related nuclear cataract, bilateral: Secondary | ICD-10-CM | POA: Diagnosis not present

## 2024-03-03 DIAGNOSIS — H43811 Vitreous degeneration, right eye: Secondary | ICD-10-CM | POA: Diagnosis not present

## 2024-03-03 DIAGNOSIS — H43391 Other vitreous opacities, right eye: Secondary | ICD-10-CM | POA: Diagnosis not present

## 2024-03-03 DIAGNOSIS — H35363 Drusen (degenerative) of macula, bilateral: Secondary | ICD-10-CM | POA: Diagnosis not present

## 2024-03-03 DIAGNOSIS — H353131 Nonexudative age-related macular degeneration, bilateral, early dry stage: Secondary | ICD-10-CM | POA: Diagnosis not present

## 2024-03-04 NOTE — Progress Notes (Signed)
 [facility]  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  She had been on observation alone for years.  She had progression of her lymphadenopathy by exam and by CT scan in May of 2025.  She has been taking oral chemotherapy with venetoclax  200 mg daily since June 16, after a slow titration. She initially had significant neutropenia. She has had a good response to treatment with decreasing lymphadenopathy and normalization of her hemoglobin and platelets.  She has had an increase in the transaminases, which have fluctuated up and down.  Her fatigue has improved and the nausea is controlled with daily Compazine .   Abnormal transaminases Mild elevation of the transaminases.  We decreased the atorvastatin  to 40 mg daily. Her most recent lipid panel looked good, but the liver transaminases continue to fluctuate up and down. If this does not improve, we will consider stopping atorvastatin  and change to Crestor . We will check a fasting lipid panel next month to help determine our decision.    Breast mass in female New bilateral breast soft tissue masses right greater than left.  Bilateral diagnostic mammogram and breast ultrasounds were negative and did note interval decrease in size of bilateral axillary adenopathy.  Plan: She complains of left hip pain rated 3/10. She has had her first steroid injection which did not seem to help, but she is hopeful that her second dose will help. She also expresses being open to surgery as a long-term treatment. Today, she would like to know if she should receive her pneumonia vaccine. I instructed her to inquire with Dr. Jefferey if she has received Prevnar 20 or Prevnar 21 first. She received her flu vaccination 2 weeks  ago, but has not yet received her COVID vaccination. She continues 75 mg Lyrica BID and a 10 mg Compazine  tablet every morning without difficulty and states that she has not been experiencing nausea since taking Compazine  every morning. I will add a lipid panel to her labs for the next time she returns. When this is back, we will evaluate this along with her liver transaminases to determine if she needs to switch her therapy for hyperlipidemia. She has a low WBC of 3.4 with an ANC of 2600 down from 2.7 with an ANC of 3000, hemoglobin of 13.6, and platelet count of 183,000. Her CMP is normal other than an elevated AST of 66 up from 52 and an ALT of 107 up from 93. I will see her back in 1 month with CBC,CMP, and lipid panel. The patient understands the plans discussed today and is in agreement with them.  She knows to contact our office if she develops concerns prior to her next appointment.  I provided 12 minutes of face-to-face time during this encounter and > 50% was spent counseling as documented under my assessment and plan.    [name], MD  [facility] A DEPT OF [facility] [address] [place] [address] Dept: [phone] Dept Fax: [phone]   No orders of the defined types were placed in this encounter.   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  She had been on observation only. She has had stable bilateral cervical and inguinal lymphadenopathy.  MRI imaging from February 2023 revealed extensive inguinal and retroperitoneal lymphadenopathy.  Her physical exam reveals a modest change in her cervical and supraclavicular adenopathy. PET scan in March 2023 revealed continued stability of mild adenopathy in the neck, chest, abdomen and  pelvis with low level FDG and Deauville 2-3 category uptake. CT imaging in September 2023 shows the numerous mildly enlarged nodes to be unchanged. Her lymphadenopathy is relatively stable at this time but just mildly increased. CT chest, abdomen and pelvis in June 2024 revealed stable bilateral axillary and supraclavicular adenopathy, no mediastinal lymphadenopathy, with a new band of linear consolidation in the left upper lobe, felt to be post infectious consolidation. She has stable retroperitoneal and proximal iliac lymphadenopathy, and no new adenopathy in the abdomen and pelvis.  Spleen was normal and no skeletal lesions.  She had progressive lymphadenopathy as well as mild anemia and thrombocytopenia in May, so was started on venetoclax  in June.  Venetoclax  was slowly titrated beginning with 20 mg daily for 1 week, 50 mg daily for 1 week, 100 mg daily for 1 week, and then 200 mg daily.  She has tolerated this fairly well with improvement in her lymphadenopathy, anemia and thrombocytopenia.  She has experienced severe fatigue and moderate nausea.   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)  11/11/2015 Cancer Staging   Staging form: Hodgkin and Non-Hodgkin Lymphoma, AJCC 8th Edition - Clinical stage from 11/11/2015: Stage III (Small lymphocytic leukemia) - Signed by Cornelius Wanda DEL, MD on 01/28/2021 Histopathologic type: Malignant lymphoma, small B lymphocytic, NOS (see also M-9823/3) Stage prefix: Initial diagnosis Diagnostic confirmation: Positive histology PLUS positive immunophenotyping and/or positive genetic studies Specimen type: Core Needle Biopsy Staged by: Managing physician Stage used in treatment planning: Yes National guidelines used in treatment planning: Yes Type of national guideline used in treatment planning: NCCN Staging comments: Watchful waiting   06/20/2020 Initial Diagnosis   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  (HCC)   Chronic lymphocytic leukemia (CLL), B-cell (HCC)  10/31/2015 Initial Diagnosis   Chronic lymphocytic leukemia (CLL), B-cell (HCC)   11/11/2015 Cancer Staging   Staging form: Chronic Lymphocytic Leukemia / Small Lymphocytic Lymphoma, AJCC 8th Edition - Clinical stage from 11/11/2015: Modified Rai Stage III (Modified Rai risk: High, Binet: Stage B, Lugano: Stage III, Lymphocytosis: Absent, Adenopathy: Present, Organomegaly: Absent, Anemia: Absent, Thrombocytopenia: Absent) - Signed by Cornelius Wanda DEL, MD on 04/05/2021 Histopathologic type: B-cell lymphocytic leukemia/small lymphocytic lymphoma (see also M-9670/3) Stage prefix: Initial diagnosis Stage used in treatment planning: Yes National guidelines used in treatment planning: Yes Type of national guideline used in treatment planning: NCCN       INTERVAL HISTORY:   Eisha is here today for repeat clinical assessment of her CLL/SLL, now on treatment with Venetoclax . Patient states that she feels good, but complains of left hip pain rated 3/10. She has had her first steroid injection which did not seem to help, but she is hopeful that her second dose will help. She also expresses being open to surgery as a long-term treatment. Today, she would like to know if she should receive her pneumonia vaccine. I instructed her to inquire with Dr. Jefferey if she has received Prevnar 20 or Prevnar 21 first. She received her flu vaccination 2 weeks ago, but has not yet received her  COVID vaccination. She continues 75 mg Lyrica BID and a 10 mg Compazine  tablet every morning without difficulty and states that she has not been experiencing nausea since taking Compazine  every morning. I will add a lipid panel to her labs for the next time she returns. When this is back, we will evaluate this along with her liver transaminases to determine if she needs to switch her therapy for hyperlipidemia. She has a low WBC of 3.4 with an ANC of 2600 down from 2.7  with an ANC of 3000, hemoglobin of 13.6, and platelet count of 183,000. Her CMP is normal other than an elevated AST of 66 up from 52 and an ALT of 107 up from 93. I will see her back in 1 month with CBC,CMP, and lipid panel.  She denies fever, chills, night sweats, or other signs of infection. She denies cardiorespiratory and gastrointestinal issues. Her appetite is good and Her weight has increased 6 pounds over last 1 month.This patient is accompanied in the office by her spouse.  REVIEW OF SYSTEMS:   Review of Systems  Constitutional:  Positive for fatigue. Negative for appetite change, chills, fever and unexpected weight change.  HENT:   Negative for lump/mass, mouth sores and sore throat.   Respiratory:  Negative for cough and shortness of breath.   Cardiovascular:  Negative for chest pain and leg swelling.  Gastrointestinal:  Negative for abdominal pain, constipation, diarrhea, nausea and vomiting.  Genitourinary:  Negative for difficulty urinating, dysuria, frequency and hematuria.   Musculoskeletal:  Positive for arthralgias (left hip 3/10), back pain (lower) and gait problem (due to hip pain). Negative for myalgias and neck pain.  Skin:  Negative for rash.  Neurological:  Positive for gait problem (due to hip pain). Negative for dizziness, extremity weakness, headaches, light-headedness and numbness.  Hematological:  Negative for adenopathy. Does not bruise/bleed easily.  Psychiatric/Behavioral:  Negative for depression and sleep disturbance. The patient is not nervous/anxious.      VITALS:   Blood pressure (!) 142/80, pulse 67, temperature 97.8 F (36.6 C), temperature source Oral, resp. rate 18, height 5' (1.524 m), weight 122 lb 3.2 oz (55.4 kg), SpO2 100%.  Wt Readings from Last 3 Encounters:  03/05/24 122 lb 3.2 oz (55.4 kg)  02/06/24 116 lb 3.2 oz (52.7 kg)  01/23/24 113 lb 4.8 oz (51.4 kg)    Body mass index is 23.87 kg/m.  Performance status (ECOG): 1 - Symptomatic  but completely ambulatory    PHYSICAL EXAM:   Physical Exam Vitals and nursing note reviewed.  Constitutional:      General: She is not in acute distress.    Appearance: Normal appearance.  HENT:     Head: Normocephalic and atraumatic.     Mouth/Throat:     Mouth: Mucous membranes are moist.     Pharynx: Oropharynx is clear. No oropharyngeal exudate or posterior oropharyngeal erythema.  Eyes:     General: No scleral icterus.    Extraocular Movements: Extraocular movements intact.     Conjunctiva/sclera: Conjunctivae normal.     Pupils: Pupils are equal, round, and reactive to light.  Cardiovascular:     Rate and Rhythm: Normal rate and regular rhythm.     Heart sounds: Normal heart sounds. No murmur heard.    No friction rub. No gallop.  Pulmonary:     Effort: Pulmonary effort is normal.     Breath sounds: Normal breath sounds. No wheezing, rhonchi or rales.  Abdominal:  General: There is no distension.     Palpations: Abdomen is soft. There is no hepatomegaly, splenomegaly or mass.     Tenderness: There is no abdominal tenderness.  Musculoskeletal:        General: Normal range of motion.     Cervical back: Normal range of motion and neck supple. No tenderness.     Right lower leg: No edema.     Left lower leg: No edema.  Lymphadenopathy:     Cervical: No cervical adenopathy.     Upper Body:     Right upper body: No supraclavicular or axillary adenopathy.     Left upper body: No supraclavicular or axillary adenopathy.     Lower Body: No right inguinal adenopathy. No left inguinal adenopathy.  Skin:    General: Skin is warm and dry.     Coloration: Skin is not jaundiced.     Findings: No rash.  Neurological:     Mental Status: She is alert and oriented to person, place, and time.     Cranial Nerves: No cranial nerve deficit.  Psychiatric:        Mood and Affect: Mood normal.        Behavior: Behavior normal.        Thought Content: Thought content normal.      LABS:      Latest Ref Rng & Units 03/05/2024    8:03 AM 02/20/2024    9:20 AM 02/06/2024    9:00 AM  CBC  WBC 4.0 - 10.5 K/uL 3.4  3.7  3.4   Hemoglobin 12.0 - 15.0 g/dL 86.3  86.2  86.4   Hematocrit 36.0 - 46.0 % 40.8  41.1  39.9   Platelets 150 - 400 K/uL 183  185  182       Latest Ref Rng & Units 03/05/2024    8:03 AM 02/20/2024    9:20 AM 02/06/2024    9:00 AM  CMP  Glucose 70 - 99 mg/dL 98  886  97   BUN 8 - 23 mg/dL 17  17  16    Creatinine 0.44 - 1.00 mg/dL 9.19  9.24  9.26   Sodium 135 - 145 mmol/L 135  136  136   Potassium 3.5 - 5.1 mmol/L 4.7  4.4  4.3   Chloride 98 - 111 mmol/L 99  98  98   CO2 22 - 32 mmol/L 27  25  27    Calcium  8.9 - 10.3 mg/dL 9.1  9.6  9.4   Total Protein 6.5 - 8.1 g/dL 7.0  7.0  6.8   Total Bilirubin 0.0 - 1.2 mg/dL 0.6  0.6  0.3   Alkaline Phos 38 - 126 U/L 72  70  75   AST 15 - 41 U/L 66  52  62   ALT 0 - 44 U/L 107  93  122     Lab Results  Component Value Date   TIBC 322 12/12/2022   TIBC 331 07/23/2021   FERRITIN 19 12/12/2022   FERRITIN 22 07/23/2021   IRONPCTSAT 30 12/12/2022   IRONPCTSAT 20 07/23/2021   Lab Results  Component Value Date   LDH 163 10/15/2023   LDH 162 07/01/2023   LDH 136 10/30/2021    STUDIES:  EXAM: 02/03/2024 DIGITAL DIAGNOSTIC BILATERAL MAMMOGRAM WITH TOMOSYNTHESIS AND CAD; ULTRASOUND RIGHT BREAST LIMITED; ULTRASOUND LEFT BREAST LIMITED IMPRESSION: 1. No mammographic or sonographic evidence of malignancy the sites of palpable concern bilaterally Any further workup of the  patient's symptoms should be based on the clinical assessment. Recommend routine annual screening mammogram in 1 year. 2. Interval decrease in size of bilateral axillary adenopathy, consistent with treatment response for known lymphoma. 3. No mammographic evidence of mammary malignancy bilaterally.   HISTORY:   Past Medical History:  Diagnosis Date   Allergy    seasonal   Arthritis    Bipolar 1 disorder (HCC)    Cancer  (HCC)    non hodgkins lymphoma   Depression    GERD (gastroesophageal reflux disease)    History of degenerative disc disease    Hyperlipidemia    Increased risk of breast cancer 04/25/2021   Obstructive sleep apnea    Osteopenia    Scoliosis    Thyroid  disease    hypothyroidism   Urticaria    Vitamin D deficiency     Past Surgical History:  Procedure Laterality Date   BREAST BIOPSY     BUBBLE STUDY  02/13/2021   Procedure: BUBBLE STUDY;  Surgeon: Raford Riggs, MD;  Location: [facility] ENDOSCOPY;  Service: Cardiovascular;;   CARPAL TUNNEL RELEASE Bilateral 2002   CESAREAN SECTION     x2   COLONOSCOPY  07/26/2008   Melanosis coli. Small internal hemorrhoids.    ENDOSCOPIC PLANTAR FASCIOTOMY     ESOPHAGOGASTRODUODENOSCOPY  03/17/2013   Mild gastritis. Status post esophageal dilatation.   LYMPH NODE BIOPSY     right hip repair torn tendon Right 09/2021   TEE WITHOUT CARDIOVERSION N/A 02/13/2021   Procedure: TRANSESOPHAGEAL ECHOCARDIOGRAM (TEE);  Surgeon: Raford Riggs, MD;  Location: [facility] ENDOSCOPY;  Service: Cardiovascular;  Laterality: N/A;   WISDOM TOOTH EXTRACTION      Family History  Problem Relation Age of Onset   Prostate cancer Father 19   Melanoma Father 21   High blood pressure Father    Breast cancer Maternal Aunt    Multiple myeloma Maternal Aunt    Breast cancer Maternal Aunt    Colon cancer Neg Hx    Colon polyps Neg Hx    Esophageal cancer Neg Hx    Rectal cancer Neg Hx    Stomach cancer Neg Hx     Social History:  reports that she has never smoked. She has never used smokeless tobacco. She reports that she does not currently use alcohol. She reports that she does not use drugs.The patient is accompanied by her husband today.  Allergies:  Allergies  Allergen Reactions   Diclofenac Sodium Other (See Comments)    Thought it had something to do with her having a stroke   Voltaren [Diclofenac Sodium]     Thought it had something to do with her having  a stroke   Nsaids Other (See Comments)    Had a stroke and worries it was related to the Diclofenac she took.    Current Medications: Current Outpatient Medications  Medication Sig Dispense Refill   amitriptyline  (ELAVIL ) 10 MG tablet Take 10 mg by mouth at bedtime.     pregabalin (LYRICA) 25 MG capsule Take 25 mg by mouth.     Ascorbic Acid (VITAMIN C) 1000 MG tablet Take 1,000 mg by mouth 2 (two) times daily.     atorvastatin  (LIPITOR) 40 MG tablet Take 40 mg by mouth daily.     estradiol (ESTRACE) 0.1 MG/GM vaginal cream 2 (two) times a week.     famotidine  (PEPCID ) 20 MG tablet Take 1 tablet (20 mg total) by mouth 2 (two) times daily. 60 tablet 0   FLUoxetine  HCl (  PROZAC  PO) Take 60 mg by mouth daily.     hydrochlorothiazide (HYDRODIURIL) 12.5 MG tablet Take 12.5 mg by mouth every morning.     Multiple Vitamin (MULTIVITAMIN ADULT PO) Take by mouth. Vision MD once a day     ondansetron  (ZOFRAN -ODT) 4 MG disintegrating tablet Take 1 tablet (4 mg total) by mouth every 8 (eight) hours as needed for nausea or vomiting. 20 tablet 2   PLAVIX  75 MG tablet Take 75 mg by mouth daily.     pregabalin (LYRICA) 75 MG capsule Take 75 mg by mouth 2 (two) times daily. (Patient taking differently: Take 75 mg by mouth at bedtime. Original order was for BID but makes patient sleepy, so patient only takes it at night)     Probiotic Product (PROBIOTIC BLEND PO) Take by mouth at bedtime.     prochlorperazine  (COMPAZINE ) 10 MG tablet Take 1 tablet (10 mg total) by mouth every 6 (six) hours as needed for nausea or vomiting. 30 tablet 5   ramipril  (ALTACE ) 5 MG capsule Take 5 mg by mouth 2 (two) times daily.     venetoclax  (VENCLEXTA ) 100 MG tablet Take 2 tablets (200 mg total) by mouth daily. Tablets should be swallowed whole with a meal and a full glass of water. 60 tablet 5   No current facility-administered medications for this visit.   [name], MD  [facility] [address] [place] [address] Dept: [phone] Dept Fax: [phone]   I,Marcelle Bebout H Nimo Verastegui,acting as a scribe for [name], MD.,have documented all relevant documentation on the behalf of [name], MD,as directed by  [name], MD while in the presence of [name], MD.  I have reviewed this report as typed by the medical scribe, and it is complete and accurate

## 2024-03-05 ENCOUNTER — Inpatient Hospital Stay: Admitting: Oncology

## 2024-03-05 ENCOUNTER — Encounter: Payer: Self-pay | Admitting: Oncology

## 2024-03-05 ENCOUNTER — Other Ambulatory Visit: Payer: Self-pay | Admitting: Oncology

## 2024-03-05 ENCOUNTER — Telehealth: Payer: Self-pay | Admitting: Oncology

## 2024-03-05 ENCOUNTER — Inpatient Hospital Stay: Attending: Hematology and Oncology

## 2024-03-05 VITALS — BP 142/80 | HR 67 | Temp 97.8°F | Resp 18 | Ht 60.0 in | Wt 122.2 lb

## 2024-03-05 DIAGNOSIS — Z8042 Family history of malignant neoplasm of prostate: Secondary | ICD-10-CM | POA: Insufficient documentation

## 2024-03-05 DIAGNOSIS — Z803 Family history of malignant neoplasm of breast: Secondary | ICD-10-CM | POA: Diagnosis not present

## 2024-03-05 DIAGNOSIS — Z807 Family history of other malignant neoplasms of lymphoid, hematopoietic and related tissues: Secondary | ICD-10-CM | POA: Diagnosis not present

## 2024-03-05 DIAGNOSIS — N6320 Unspecified lump in the left breast, unspecified quadrant: Secondary | ICD-10-CM | POA: Insufficient documentation

## 2024-03-05 DIAGNOSIS — C8302 Small cell B-cell lymphoma, intrathoracic lymph nodes: Secondary | ICD-10-CM | POA: Insufficient documentation

## 2024-03-05 DIAGNOSIS — C9110 Chronic lymphocytic leukemia of B-cell type not having achieved remission: Secondary | ICD-10-CM | POA: Diagnosis not present

## 2024-03-05 DIAGNOSIS — Z9221 Personal history of antineoplastic chemotherapy: Secondary | ICD-10-CM | POA: Diagnosis not present

## 2024-03-05 DIAGNOSIS — Z808 Family history of malignant neoplasm of other organs or systems: Secondary | ICD-10-CM | POA: Insufficient documentation

## 2024-03-05 DIAGNOSIS — N6310 Unspecified lump in the right breast, unspecified quadrant: Secondary | ICD-10-CM | POA: Diagnosis not present

## 2024-03-05 DIAGNOSIS — R7401 Elevation of levels of liver transaminase levels: Secondary | ICD-10-CM | POA: Diagnosis not present

## 2024-03-05 LAB — CBC WITH DIFFERENTIAL (CANCER CENTER ONLY)
Abs Immature Granulocytes: 0.01 K/uL (ref 0.00–0.07)
Basophils Absolute: 0 K/uL (ref 0.0–0.1)
Basophils Relative: 0 %
Eosinophils Absolute: 0 K/uL (ref 0.0–0.5)
Eosinophils Relative: 0 %
HCT: 40.8 % (ref 36.0–46.0)
Hemoglobin: 13.6 g/dL (ref 12.0–15.0)
Immature Granulocytes: 0 %
Lymphocytes Relative: 6 %
Lymphs Abs: 0.2 K/uL — ABNORMAL LOW (ref 0.7–4.0)
MCH: 33.6 pg (ref 26.0–34.0)
MCHC: 33.3 g/dL (ref 30.0–36.0)
MCV: 100.7 fL — ABNORMAL HIGH (ref 80.0–100.0)
Monocytes Absolute: 0.6 K/uL (ref 0.1–1.0)
Monocytes Relative: 16 %
Neutro Abs: 2.6 K/uL (ref 1.7–7.7)
Neutrophils Relative %: 78 %
Platelet Count: 183 K/uL (ref 150–400)
RBC: 4.05 MIL/uL (ref 3.87–5.11)
RDW: 13.7 % (ref 11.5–15.5)
WBC Count: 3.4 K/uL — ABNORMAL LOW (ref 4.0–10.5)
nRBC: 0 % (ref 0.0–0.2)

## 2024-03-05 LAB — CMP (CANCER CENTER ONLY)
ALT: 107 U/L — ABNORMAL HIGH (ref 0–44)
AST: 66 U/L — ABNORMAL HIGH (ref 15–41)
Albumin: 4.3 g/dL (ref 3.5–5.0)
Alkaline Phosphatase: 72 U/L (ref 38–126)
Anion gap: 9 (ref 5–15)
BUN: 17 mg/dL (ref 8–23)
CO2: 27 mmol/L (ref 22–32)
Calcium: 9.1 mg/dL (ref 8.9–10.3)
Chloride: 99 mmol/L (ref 98–111)
Creatinine: 0.8 mg/dL (ref 0.44–1.00)
GFR, Estimated: >60 mL/min (ref >60)
Glucose, Bld: 98 mg/dL (ref 70–99)
Potassium: 4.7 mmol/L (ref 3.5–5.1)
Sodium: 135 mmol/L (ref 135–145)
Total Bilirubin: 0.6 mg/dL (ref 0.0–1.2)
Total Protein: 7 g/dL (ref 6.5–8.1)

## 2024-03-05 NOTE — Telephone Encounter (Signed)
 Patient has been scheduled for follow-up visit per 03/04/24 LOS.  Pt noted appt details on personal electronic device.

## 2024-03-10 ENCOUNTER — Other Ambulatory Visit: Payer: Self-pay | Admitting: Hematology and Oncology

## 2024-03-10 DIAGNOSIS — R112 Nausea with vomiting, unspecified: Secondary | ICD-10-CM

## 2024-03-11 ENCOUNTER — Encounter: Payer: Self-pay | Admitting: Oncology

## 2024-03-15 ENCOUNTER — Other Ambulatory Visit: Payer: Self-pay

## 2024-03-15 ENCOUNTER — Encounter (INDEPENDENT_AMBULATORY_CARE_PROVIDER_SITE_OTHER): Payer: Self-pay

## 2024-03-15 ENCOUNTER — Other Ambulatory Visit (HOSPITAL_COMMUNITY): Payer: Self-pay

## 2024-03-15 DIAGNOSIS — M5416 Radiculopathy, lumbar region: Secondary | ICD-10-CM | POA: Diagnosis not present

## 2024-03-15 NOTE — Progress Notes (Signed)
 Specialty Pharmacy Refill Coordination Note  Dawn Prince is a 63 y.o. female contacted today regarding refills of specialty medication(s) Venetoclax  (VENCLEXTA )   Patient requested Delivery   Delivery date: 03/17/24   Verified address: [address] [place] [address]   Medication will be filled on 10.21.25.

## 2024-03-18 ENCOUNTER — Encounter

## 2024-03-24 ENCOUNTER — Encounter: Payer: Self-pay | Admitting: Oncology

## 2024-03-29 ENCOUNTER — Encounter

## 2024-04-02 ENCOUNTER — Other Ambulatory Visit (HOSPITAL_BASED_OUTPATIENT_CLINIC_OR_DEPARTMENT_OTHER): Payer: Self-pay | Admitting: Orthopedic Surgery

## 2024-04-02 ENCOUNTER — Inpatient Hospital Stay: Admitting: Oncology

## 2024-04-02 ENCOUNTER — Inpatient Hospital Stay

## 2024-04-02 DIAGNOSIS — M810 Age-related osteoporosis without current pathological fracture: Secondary | ICD-10-CM

## 2024-04-05 ENCOUNTER — Inpatient Hospital Stay: Attending: Hematology and Oncology

## 2024-04-05 ENCOUNTER — Other Ambulatory Visit (HOSPITAL_BASED_OUTPATIENT_CLINIC_OR_DEPARTMENT_OTHER): Payer: Self-pay

## 2024-04-05 DIAGNOSIS — C9110 Chronic lymphocytic leukemia of B-cell type not having achieved remission: Secondary | ICD-10-CM

## 2024-04-05 LAB — CBC WITH DIFFERENTIAL (CANCER CENTER ONLY)
Abs Immature Granulocytes: 0 K/uL (ref 0.00–0.07)
Basophils Absolute: 0 K/uL (ref 0.0–0.1)
Basophils Relative: 0 %
Eosinophils Absolute: 0 K/uL (ref 0.0–0.5)
Eosinophils Relative: 0 %
HCT: 39.8 % (ref 36.0–46.0)
Hemoglobin: 13.3 g/dL (ref 12.0–15.0)
Immature Granulocytes: 0 %
Lymphocytes Relative: 8 %
Lymphs Abs: 0.2 K/uL — ABNORMAL LOW (ref 0.7–4.0)
MCH: 33.5 pg (ref 26.0–34.0)
MCHC: 33.4 g/dL (ref 30.0–36.0)
MCV: 100.3 fL — ABNORMAL HIGH (ref 80.0–100.0)
Monocytes Absolute: 0.4 K/uL (ref 0.1–1.0)
Monocytes Relative: 18 %
Neutro Abs: 1.7 K/uL (ref 1.7–7.7)
Neutrophils Relative %: 74 %
Platelet Count: 201 K/uL (ref 150–400)
RBC: 3.97 MIL/uL (ref 3.87–5.11)
RDW: 13.5 % (ref 11.5–15.5)
WBC Count: 2.3 K/uL — ABNORMAL LOW (ref 4.0–10.5)
nRBC: 0 % (ref 0.0–0.2)

## 2024-04-05 LAB — CMP (CANCER CENTER ONLY)
ALT: 125 U/L — ABNORMAL HIGH (ref 0–44)
AST: 92 U/L — ABNORMAL HIGH (ref 15–41)
Albumin: 4.2 g/dL (ref 3.5–5.0)
Alkaline Phosphatase: 72 U/L (ref 38–126)
Anion gap: 11 (ref 5–15)
BUN: 13 mg/dL (ref 8–23)
CO2: 27 mmol/L (ref 22–32)
Calcium: 9.1 mg/dL (ref 8.9–10.3)
Chloride: 102 mmol/L (ref 98–111)
Creatinine: 0.79 mg/dL (ref 0.44–1.00)
GFR, Estimated: >60 mL/min (ref >60)
Glucose, Bld: 95 mg/dL (ref 70–99)
Potassium: 4.2 mmol/L (ref 3.5–5.1)
Sodium: 140 mmol/L (ref 135–145)
Total Bilirubin: 0.4 mg/dL (ref 0.0–1.2)
Total Protein: 6.5 g/dL (ref 6.5–8.1)

## 2024-04-05 MED ORDER — CAPVAXIVE 0.5 ML IM SOSY
0.5000 mL | PREFILLED_SYRINGE | Freq: Once | INTRAMUSCULAR | 0 refills | Status: AC
  Filled 2024-04-05: qty 0.5, 1d supply, fill #0

## 2024-04-06 ENCOUNTER — Other Ambulatory Visit: Payer: Self-pay

## 2024-04-06 ENCOUNTER — Other Ambulatory Visit (HOSPITAL_BASED_OUTPATIENT_CLINIC_OR_DEPARTMENT_OTHER): Payer: Self-pay

## 2024-04-06 ENCOUNTER — Other Ambulatory Visit: Payer: Self-pay | Admitting: Hematology and Oncology

## 2024-04-06 MED ORDER — VENETOCLAX 100 MG PO TABS
200.0000 mg | ORAL_TABLET | Freq: Every day | ORAL | 5 refills | Status: AC
  Filled 2024-04-06: qty 84, 42d supply, fill #0
  Filled 2024-04-07: qty 56, 28d supply, fill #0
  Filled 2024-05-10: qty 56, 28d supply, fill #1
  Filled 2024-06-04: qty 56, 28d supply, fill #2

## 2024-04-07 ENCOUNTER — Inpatient Hospital Stay (HOSPITAL_BASED_OUTPATIENT_CLINIC_OR_DEPARTMENT_OTHER): Admitting: Oncology

## 2024-04-07 ENCOUNTER — Other Ambulatory Visit: Payer: Self-pay | Admitting: Oncology

## 2024-04-07 ENCOUNTER — Other Ambulatory Visit: Payer: Self-pay

## 2024-04-07 ENCOUNTER — Other Ambulatory Visit (HOSPITAL_COMMUNITY): Payer: Self-pay

## 2024-04-07 ENCOUNTER — Encounter: Payer: Self-pay | Admitting: Oncology

## 2024-04-07 ENCOUNTER — Ambulatory Visit (INDEPENDENT_AMBULATORY_CARE_PROVIDER_SITE_OTHER)
Admission: RE | Admit: 2024-04-07 | Discharge: 2024-04-07 | Disposition: A | Discharge status: Home / Self Care | Source: Ambulatory Visit | Attending: Orthopedic Surgery | Admitting: Orthopedic Surgery

## 2024-04-07 ENCOUNTER — Encounter (INDEPENDENT_AMBULATORY_CARE_PROVIDER_SITE_OTHER): Payer: Self-pay

## 2024-04-07 VITALS — BP 139/80 | HR 73 | Temp 97.5°F | Resp 16 | Ht 60.0 in | Wt 126.7 lb

## 2024-04-07 DIAGNOSIS — M810 Age-related osteoporosis without current pathological fracture: Secondary | ICD-10-CM

## 2024-04-07 DIAGNOSIS — C9110 Chronic lymphocytic leukemia of B-cell type not having achieved remission: Secondary | ICD-10-CM

## 2024-04-07 LAB — LIPID PANEL
Cholesterol: 170 mg/dL (ref 0–200)
HDL: 81 mg/dL (ref >40)
LDL Cholesterol: 77 mg/dL (ref 0–99)
Total CHOL/HDL Ratio: 2.1 ratio
Triglycerides: 64 mg/dL (ref <150)
VLDL: 13 mg/dL (ref 0–40)

## 2024-04-07 NOTE — Progress Notes (Signed)
 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  She had been on observation alone for years.  She had progression of her lymphadenopathy by exam and by CT scan in May of 2025.  She has been taking oral chemotherapy with venetoclax  200 mg daily since June 16, after a slow titration. She initially had significant neutropenia. She has had a good response to treatment with decreasing lymphadenopathy and normalization of her hemoglobin and platelets.  She has had an increase in the transaminases, which have fluctuated up and down. Her fatigue has improved and the nausea is controlled with daily Compazine .   Abnormal transaminases Mild elevation of the transaminases.  We decreased the atorvastatin  to 40 mg daily. Her most recent lipid panel looked good, but the liver transaminases continue to fluctuate up and down. They have now worsened so we are stopping the atorvastatin  and Dr. Jefferey will change to Crestor . We will check a fasting lipid panel this week.   Breast mass in female Bilateral breast soft tissue masses right greater than left.  Bilateral diagnostic mammogram and breast ultrasounds were negative and did note interval decrease in size of bilateral axillary adenopathy as of September, 2025.   Plan: Her husband informed me that she may need back surgery as she has had 2 steroid injections without no relief in pain. She is unable to be up and mobile for any length of time and is unable to get any relief unless she lies down. I informed them that we may need to hold her Venetoclax  for 1 week pre-op and 1 week post-op if she does proceed with surgery. I also advised her to stop her Lipitor 40 mg now due to persistent abnormal liver  transaminases. She had a bone density scan done today that revealed osteoporosis of the left femoral neck with a T-score of -3.4, left total hip with a T-score of -3.4, right femoral neck with a T-score of -3.8, right total hip with a T-score of -3.6, and osteopenia of the left forearm with a T-score of -1.8. I instructed her to take vitamin D  with calcium  immediately and we discussed bone strengthening medicines such as Fosamax, Prolia, and Reclast.  We discussed the risks and benefits of these as well as the schedule. We agreed to start Prolia injections once every 6 months, unless her orthopedic surgeon recommends something stronger. We will continue to monitor her labs with her injections. She had labs done on 04/05/2024 and had a low WBC of 2.3 with an ANC of 1700, hemoglobin of 13.3, and platelet count of 201,000. Her CMP is normal other than an elevated AST of 92 and ALT 125. I will add a lipid panel and vitamin D  level to her labs today. I will see her back in 4 weeks with CBC and CMP.  The patient understands the plans discussed today and is in agreement with them.  She knows to contact our office if she develops concerns prior to her next appointment.  I provided 26 minutes of face-to-face time during this encounter and > 50% was spent counseling as documented under my assessment and plan.   Dawn VEAR Cornish, MD  [facility] [place] - A DEPT OF [facility] [address]  [place] [address] Dept: [phone] Dept Fax: [phone]   No orders of the defined types were placed in this encounter.   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  She had been on observation only. She has had stable bilateral cervical and inguinal lymphadenopathy.  MRI imaging from  February 2023 revealed extensive inguinal and retroperitoneal lymphadenopathy.  Her physical exam reveals a modest change in her cervical and supraclavicular adenopathy. PET scan in March 2023 revealed continued stability of mild adenopathy in the neck, chest, abdomen and pelvis with low level FDG and Deauville 2-3 category uptake. CT imaging in September 2023 shows the numerous mildly enlarged nodes to be unchanged. Her lymphadenopathy is relatively stable at this time but just mildly increased. CT chest, abdomen and pelvis in June 2024 revealed stable bilateral axillary and supraclavicular adenopathy, no mediastinal lymphadenopathy, with a new band of linear consolidation in the left upper lobe, felt to be post infectious consolidation. She has stable retroperitoneal and proximal iliac lymphadenopathy, and no new adenopathy in the abdomen and pelvis.  Spleen was normal and no skeletal lesions.  She had progressive lymphadenopathy as well as mild anemia and thrombocytopenia in May, so was started on venetoclax  in June.  Venetoclax  was slowly titrated beginning with 20 mg daily for 1 week, 50 mg daily for 1 week, 100 mg daily for 1 week, and then 200 mg daily.  She has tolerated this fairly well with improvement in her lymphadenopathy, anemia and thrombocytopenia.  She has experienced severe fatigue and moderate nausea.   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)  11/11/2015 Cancer Staging   Staging form: Hodgkin and Non-Hodgkin Lymphoma, AJCC 8th Edition - Clinical stage from 11/11/2015: Stage III (Small lymphocytic leukemia) - Signed by Dawn Dawn DEL, MD on 01/28/2021 Histopathologic type: Malignant lymphoma, small B lymphocytic, NOS (see also M-9823/3) Stage prefix: Initial diagnosis Diagnostic confirmation: Positive histology PLUS positive immunophenotyping and/or positive genetic studies Specimen type: Core Needle Biopsy Staged by: Managing physician Stage  used in treatment planning: Yes National guidelines used in treatment planning: Yes Type of national guideline used in treatment planning: NCCN Staging comments: Watchful waiting   06/20/2020 Initial Diagnosis   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)   Chronic lymphocytic leukemia (CLL), B-cell (HCC)  10/31/2015 Initial Diagnosis   Chronic lymphocytic leukemia (CLL), B-cell (HCC)   11/11/2015 Cancer Staging   Staging form: Chronic Lymphocytic Leukemia / Small Lymphocytic Lymphoma, AJCC 8th Edition - Clinical stage from 11/11/2015: Modified Rai Stage III (Modified Rai risk: High, Binet: Stage B, Lugano: Stage III, Lymphocytosis: Absent, Adenopathy: Present, Organomegaly: Absent, Anemia: Absent, Thrombocytopenia: Absent) - Signed by Dawn Dawn DEL, MD on 04/05/2021 Histopathologic type: B-cell lymphocytic leukemia/small lymphocytic lymphoma (see also M-9670/3) Stage prefix: Initial diagnosis Stage used in treatment planning: Yes National guidelines used in treatment planning: Yes Type of national guideline used in treatment planning: NCCN     INTERVAL HISTORY:  Dawn Prince is here today for repeat clinical assessment of her CLL/SLL, now on treatment with Venetoclax . Patient states that she feels well but complains of left hip pain. Her husband informed me that she may need back surgery as she has had 2 steroid injections without no relief in pain. She is unable to be up and mobile for any  length of time and is unable to get any relief unless she lies down. I informed them that we may need to hold her Venetoclax  for 1 week pre-op and 1 week post-op if she does proceed with surgery. I also advised her to stop her Lipitor 40 mg now due to persistent abnormal liver transaminases. She had a bone density scan done today that revealed osteoporosis of the left femoral neck with a T-score of -3.4, left total hip with a T-score of -3.4, right femoral neck with a T-score of -3.8, right  total hip with a T-score of -3.6, and osteopenia of the left forearm with a T-score of -1.8. I instructed her to take vitamin D  with calcium  immediately and we discussed bone strengthening medicines such as Fosamax, Prolia, and Reclast.  We discussed the risks and benefits of these as well as the schedule. We agreed to start Prolia injections once every 6 months, unless her orthopedic surgeon recommends something stronger. We will continue to monitor her labs with her injections. She had labs done on 04/05/2024 and had a low WBC of 2.3 with an ANC of 1700, hemoglobin of 13.3, and platelet count of 201,000. Her CMP is normal other than an elevated AST of 92 and ALT 125. I will add a lipid panel and vitamin D  level to her labs today. I will see her back in 4 weeks with CBC and CMP. She denies fever, chills, night sweats, or other signs of infection. She denies cardiorespiratory and gastrointestinal issues. Her appetite is good. Her weight has increased 4 pounds over last month. This patient is accompanied in the office by her spouse.  REVIEW OF SYSTEMS:  Review of Systems  Constitutional:  Positive for fatigue. Negative for appetite change, chills, fever and unexpected weight change.  HENT:  Negative.  Negative for lump/mass, mouth sores and sore throat.   Eyes: Negative.   Respiratory: Negative.  Negative for chest tightness, cough, hemoptysis, shortness of breath and wheezing.   Cardiovascular: Negative.  Negative for chest pain, leg swelling and palpitations.  Gastrointestinal: Negative.  Negative for abdominal distention, abdominal pain, blood in stool, constipation, diarrhea, nausea and vomiting.  Endocrine: Negative.   Genitourinary: Negative.  Negative for difficulty urinating, dysuria, frequency and hematuria.   Musculoskeletal:  Positive for arthralgias (left hip 3/10), back pain (lower) and gait problem (due to hip pain). Negative for flank pain, myalgias and neck pain.  Skin: Negative.   Negative for rash.  Neurological:  Positive for gait problem (due to hip pain). Negative for dizziness, extremity weakness, headaches, light-headedness, numbness, seizures and speech difficulty.  Hematological: Negative.  Negative for adenopathy. Does not bruise/bleed easily.  Psychiatric/Behavioral: Negative.  Negative for depression and sleep disturbance. The patient is not nervous/anxious.      VITALS:  Blood pressure 139/80, pulse 73, temperature (!) 97.5 F (36.4 C), temperature source Oral, resp. rate 16, height 5' (1.524 m), weight 126 lb 11.2 oz (57.5 kg), SpO2 100%.  Wt Readings from Last 3 Encounters:  04/07/24 126 lb 11.2 oz (57.5 kg)  03/05/24 122 lb 3.2 oz (55.4 kg)  02/06/24 116 lb 3.2 oz (52.7 kg)    Body mass index is 24.74 kg/m.  Performance status (ECOG): 1 - Symptomatic but completely ambulatory  PHYSICAL EXAM:   Physical Exam Vitals and nursing note reviewed. Exam conducted with a chaperone present.  Constitutional:      General: She is not in acute distress.    Appearance: Normal appearance. She is normal weight.  HENT:     Head: Normocephalic and atraumatic.     Right Ear: Tympanic membrane, ear canal and external ear normal. There is no impacted cerumen.     Left Ear: Tympanic membrane, ear canal and external ear normal. There is no impacted cerumen.     Nose: Nose normal. No congestion or rhinorrhea.     Mouth/Throat:     Mouth: Mucous membranes are moist.     Pharynx: Oropharynx is clear. No oropharyngeal exudate or posterior oropharyngeal erythema.  Eyes:     General: No scleral icterus.       Right eye: No discharge.        Left eye: No discharge.     Extraocular Movements: Extraocular movements intact.     Conjunctiva/sclera: Conjunctivae normal.     Pupils: Pupils are equal, round, and reactive to light.  Cardiovascular:     Rate and Rhythm: Normal rate and regular rhythm.     Pulses: Normal pulses.     Heart sounds: Normal heart sounds. No  murmur heard.    No friction rub. No gallop.  Pulmonary:     Effort: Pulmonary effort is normal. No respiratory distress.     Breath sounds: Normal breath sounds. No stridor. No wheezing, rhonchi or rales.  Chest:     Chest wall: No tenderness.  Abdominal:     General: Bowel sounds are normal. There is no distension.     Palpations: Abdomen is soft. There is no hepatomegaly, splenomegaly or mass.     Tenderness: There is no abdominal tenderness.  Musculoskeletal:        General: Normal range of motion.     Cervical back: Normal range of motion and neck supple. No tenderness.     Right lower leg: No edema.     Left lower leg: No edema.  Lymphadenopathy:     Cervical: No cervical adenopathy.     Right cervical: No superficial, deep or posterior cervical adenopathy.    Left cervical: No superficial, deep or posterior cervical adenopathy.     Upper Body:     Right upper body: No supraclavicular, axillary or pectoral adenopathy.     Left upper body: No supraclavicular, axillary or pectoral adenopathy.     Lower Body: No right inguinal adenopathy. No left inguinal adenopathy.     Comments: 1cm node in the inner left popliteal fossa but a soft node in the medial right popliteal fossa measuring 2-3cm Possible node in the right axilla  Skin:    General: Skin is warm and dry.     Coloration: Skin is not jaundiced or pale.     Findings: No bruising, erythema, lesion or rash.  Neurological:     General: No focal deficit present.     Mental Status: She is alert and oriented to person, place, and time. Mental status is at baseline.     Cranial Nerves: No cranial nerve deficit.     Sensory: No sensory deficit.     Coordination: Coordination normal.     Gait: Gait normal.     Deep Tendon Reflexes: Reflexes normal.  Psychiatric:        Mood and Affect: Mood normal.        Behavior: Behavior normal.        Thought Content: Thought content normal.        Judgment: Judgment normal.      LABS:      Latest Ref Rng & Units 04/05/2024  8:37 AM 03/05/2024    8:03 AM 02/20/2024    9:20 AM  CBC  WBC 4.0 - 10.5 K/uL 2.3  3.4  3.7   Hemoglobin 12.0 - 15.0 g/dL 86.6  86.3  86.2   Hematocrit 36.0 - 46.0 % 39.8  40.8  41.1   Platelets 150 - 400 K/uL 201  183  185       Latest Ref Rng & Units 04/05/2024    8:37 AM 03/05/2024    8:03 AM 02/20/2024    9:20 AM  CMP  Glucose 70 - 99 mg/dL 95  98  886   BUN 8 - 23 mg/dL 13  17  17    Creatinine 0.44 - 1.00 mg/dL 9.20  9.19  9.24   Sodium 135 - 145 mmol/L 140  135  136   Potassium 3.5 - 5.1 mmol/L 4.2  4.7  4.4   Chloride 98 - 111 mmol/L 102  99  98   CO2 22 - 32 mmol/L 27  27  25    Calcium  8.9 - 10.3 mg/dL 9.1  9.1  9.6   Total Protein 6.5 - 8.1 g/dL 6.5  7.0  7.0   Total Bilirubin 0.0 - 1.2 mg/dL 0.4  0.6  0.6   Alkaline Phos 38 - 126 U/L 72  72  70   AST 15 - 41 U/L 92  66  52   ALT 0 - 44 U/L 125  107  93     Lab Results  Component Value Date   TIBC 322 12/12/2022   TIBC 331 07/23/2021   FERRITIN 19 12/12/2022   FERRITIN 22 07/23/2021   IRONPCTSAT 30 12/12/2022   IRONPCTSAT 20 07/23/2021   Lab Results  Component Value Date   LDH 163 10/15/2023   LDH 162 07/01/2023   LDH 136 10/30/2021    STUDIES:  EXAM: 04/07/2024 DUAL X-RAY ABSORPTIOMETRY (DXA) FOR BONE MINERAL DENSITY IMPRESSION: LEFT FEMORAL NECK: BMD (in g/cm2): 0.569 T-score: -3.4 Z-score: -1.9   LEFT TOTAL HIP: BMD (in g/cm2): 0.584 T-score: -3.4 Z-score: -2.1   RIGHT FEMORAL NECK: BMD (in g/cm2): 0.517 T-score: -3.8 Z-score: -2.3   RIGHT TOTAL HIP: BMD (in g/cm2): 0.553 T-score: -3.6 Z-score: -2.4   LEFT FOREARM (RADIUS 33%): BMD (in g/cm2): 0.721 T-score: -1.8 Z-score: -0.3   EXAM: 02/03/2024 DIGITAL DIAGNOSTIC BILATERAL MAMMOGRAM WITH TOMOSYNTHESIS AND CAD; ULTRASOUND RIGHT BREAST LIMITED; ULTRASOUND LEFT BREAST LIMITED IMPRESSION: 1. No mammographic or sonographic evidence of malignancy the sites of palpable  concern bilaterally Any further workup of the patient's symptoms should be based on the clinical assessment. Recommend routine annual screening mammogram in 1 year. 2. Interval decrease in size of bilateral axillary adenopathy, consistent with treatment response for known lymphoma. 3. No mammographic evidence of mammary malignancy bilaterally.   HISTORY:   Past Medical History:  Diagnosis Date   Allergy    seasonal   Arthritis    Bipolar 1 disorder (HCC)    Cancer (HCC)    non hodgkins lymphoma   Depression    GERD (gastroesophageal reflux disease)    History of degenerative disc disease    Hyperlipidemia    Increased risk of breast cancer 04/25/2021   Obstructive sleep apnea    Osteopenia    Scoliosis    Thyroid  disease    hypothyroidism   Urticaria    Vitamin D  deficiency     Past Surgical History:  Procedure Laterality Date   BREAST BIOPSY     BUBBLE STUDY  02/13/2021   Procedure: BUBBLE STUDY;  Surgeon: Raford Riggs, MD;  Location: [facility] ENDOSCOPY;  Service: Cardiovascular;;   CARPAL TUNNEL RELEASE Bilateral 2002   CESAREAN SECTION     x2   COLONOSCOPY  07/26/2008   Melanosis coli. Small internal hemorrhoids.    ENDOSCOPIC PLANTAR FASCIOTOMY     ESOPHAGOGASTRODUODENOSCOPY  03/17/2013   Mild gastritis. Status post esophageal dilatation.   LYMPH NODE BIOPSY     right hip repair torn tendon Right 09/2021   TEE WITHOUT CARDIOVERSION N/A 02/13/2021   Procedure: TRANSESOPHAGEAL ECHOCARDIOGRAM (TEE);  Surgeon: Raford Riggs, MD;  Location: [facility] ENDOSCOPY;  Service: Cardiovascular;  Laterality: N/A;   WISDOM TOOTH EXTRACTION      Family History  Problem Relation Age of Onset   Prostate cancer Father 12   Melanoma Father 65   High blood pressure Father    Breast cancer Maternal Aunt    Multiple myeloma Maternal Aunt    Breast cancer Maternal Aunt    Colon cancer Neg Hx    Colon polyps Neg Hx    Esophageal cancer Neg Hx    Rectal cancer Neg Hx    Stomach  cancer Neg Hx     Social History:  reports that she has never smoked. She has never used smokeless tobacco. She reports that she does not currently use alcohol. She reports that she does not use drugs.The patient is accompanied by her husband today.  Allergies:  Allergies  Allergen Reactions   Diclofenac Sodium Other (See Comments)    Thought it had something to do with her having a stroke   Voltaren [Diclofenac Sodium]     Thought it had something to do with her having a stroke   Nsaids Other (See Comments)    Had a stroke and worries it was related to the Diclofenac she took.    Current Medications: Current Outpatient Medications  Medication Sig Dispense Refill   pregabalin (LYRICA) 25 MG capsule Take 25 mg by mouth. (Patient taking differently: Take 25 mg by mouth as needed.)     pregabalin (LYRICA) 75 MG capsule Take 75 mg by mouth 2 (two) times daily. (Patient taking differently: Take 75 mg by mouth at bedtime. Original order was for BID but makes patient sleepy, so patient only takes it at night)     amitriptyline  (ELAVIL ) 10 MG tablet Take 10 mg by mouth at bedtime.     Ascorbic Acid (VITAMIN C) 1000 MG tablet Take 1,000 mg by mouth 2 (two) times daily.     estradiol (ESTRACE) 0.1 MG/GM vaginal cream 2 (two) times a week.     famotidine  (PEPCID ) 20 MG tablet Take 1 tablet (20 mg total) by mouth 2 (two) times daily. 60 tablet 0   FLUoxetine  HCl (PROZAC  PO) Take 60 mg by mouth daily.     hydrochlorothiazide (HYDRODIURIL) 12.5 MG tablet Take 12.5 mg by mouth every morning.     Multiple Vitamin (MULTIVITAMIN ADULT PO) Take by mouth. Vision MD once a day     ondansetron  (ZOFRAN -ODT) 4 MG disintegrating tablet Take 1 tablet (4 mg total) by mouth every 8 (eight) hours as needed for nausea or vomiting. 20 tablet 2   PLAVIX  75 MG tablet Take 75 mg by mouth daily.     Probiotic Product (PROBIOTIC BLEND PO) Take by mouth at bedtime.     prochlorperazine  (COMPAZINE ) 10 MG tablet Take 1  tablet (10 mg total) by mouth every 6 (six) hours as needed for nausea or vomiting.  30 tablet 5   ramipril  (ALTACE ) 5 MG capsule Take 5 mg by mouth 2 (two) times daily.     venetoclax  (VENCLEXTA ) 100 MG tablet Take 2 tablets (200 mg total) by mouth daily. Tablets should be swallowed whole with a meal and a full glass of water. 60 tablet 5   No current facility-administered medications for this visit.    I,Sehaj Mcenroe H Amire Leazer,acting as a scribe for Dawn VEAR Cornish, MD.,have documented all relevant documentation on the behalf of Dawn VEAR Cornish, MD,as directed by  Dawn VEAR Cornish, MD while in the presence of Dawn VEAR Cornish, MD.  I have reviewed this report as typed by the medical scribe, and it is complete and accurate

## 2024-04-07 NOTE — Progress Notes (Signed)
 Specialty Pharmacy Refill Coordination Note  MyChart Questionnaire Submission  Dawn Prince is a 63 y.o. female contacted today regarding refills of specialty medication(s) Venclexta .  Doses on hand: (Patient-Rptd) 28   Patient requested: (Patient-Rptd) Delivery   Delivery date: 04/09/24  Verified address: [address] [place] [address]  Medication will be filled on 04/08/24.

## 2024-04-08 ENCOUNTER — Inpatient Hospital Stay: Admitting: Dietician

## 2024-04-08 ENCOUNTER — Encounter

## 2024-04-08 NOTE — Progress Notes (Signed)
 Patient called with concern of recent diagnosis of osteoporosis.  She is worried about bone health.  We reviewed food sources of calcium  and best ways to increase absorption with pacing through out day and taking with meals.  She is also taking Vit D3 1000IU daily.  Had her serum Vit D levels check yesterday.  Discussed physical activity but with her scoliosis she is limited. Suggested she ask about a referral for PT.  Micheline Craven, RDN, LDN Registered Dietitian, [facility] Part Time Remote (Usual office hours: Tuesday-Thursday) Mobile: [phone] Remote Office: [phone]

## 2024-04-13 ENCOUNTER — Other Ambulatory Visit: Payer: Self-pay

## 2024-04-13 ENCOUNTER — Encounter: Payer: Self-pay | Admitting: Adult Health

## 2024-04-13 NOTE — Progress Notes (Signed)
 Specialty Pharmacy Ongoing Clinical Assessment Note  Dawn Prince is a 63 y.o. female who is being followed by the specialty pharmacy service for RxSp Oncology   Patient's specialty medication(s) reviewed today: Venetoclax  (VENCLEXTA )   Missed doses in the last 4 weeks: 0   Patient/Caregiver did not have any additional questions or concerns.   Therapeutic benefit summary: Patient is achieving benefit   Adverse events/side effects summary: Experienced adverse events/side effects (ongoing, tolerable fatigue, her nausea has improved with Compazine  use)   Patient's therapy is appropriate to: Continue    Goals Addressed             This Visit's Progress    Maintain optimal adherence to therapy   On track    Patient is on track. Patient will maintain adherence         Follow up: 3 months  Silvano LOISE Dolly Specialty Pharmacist

## 2024-04-15 ENCOUNTER — Encounter: Payer: Self-pay | Admitting: Oncology

## 2024-04-19 ENCOUNTER — Telehealth: Payer: Self-pay

## 2024-04-19 NOTE — Telephone Encounter (Addendum)
 Pt concerned as the Vit D levels have not been resulted as of yet. Pt asking if she needs to come in for another re-draw. Is this a [facility] issue? Cody, phlebotomy: Herminia its just backed up because of Thanksgiving. It happens almost every holiday. I can call to get an ETA, hopefully that will ease their anxiety!  Velma, phlebotomy @ [phone] - I just got off the phone with [facility], she said that sample was sent to another lab for processing, her expected date was 11/28.

## 2024-04-20 ENCOUNTER — Other Ambulatory Visit: Payer: Self-pay | Admitting: Pharmacist

## 2024-04-20 ENCOUNTER — Encounter: Payer: Self-pay | Admitting: Oncology

## 2024-04-20 DIAGNOSIS — M810 Age-related osteoporosis without current pathological fracture: Secondary | ICD-10-CM | POA: Insufficient documentation

## 2024-04-20 LAB — VITAMIN D 1,25 DIHYDROXY
Vitamin D 1, 25 (OH)2 Total: 38 pg/mL
Vitamin D2 1, 25 (OH)2: <10 pg/mL
Vitamin D3 1, 25 (OH)2: 38 pg/mL

## 2024-04-21 ENCOUNTER — Other Ambulatory Visit: Payer: Self-pay

## 2024-04-21 ENCOUNTER — Encounter: Payer: Self-pay | Admitting: Oncology

## 2024-04-21 NOTE — Telephone Encounter (Signed)
 Latest Reference Range & Units 04/05/24 08:37  Vitamin D  1, 25 (OH) Total pg/mL 38  Vitamin D2 1, 25 (OH) pg/mL <10  Vitamin D3 1, 25 (OH) pg/mL 38

## 2024-04-23 ENCOUNTER — Other Ambulatory Visit: Payer: Self-pay | Admitting: Oncology

## 2024-04-23 DIAGNOSIS — C9110 Chronic lymphocytic leukemia of B-cell type not having achieved remission: Secondary | ICD-10-CM

## 2024-04-30 ENCOUNTER — Telehealth: Payer: Self-pay

## 2024-04-30 NOTE — Telephone Encounter (Signed)
 Insurance would not pay for prolia injections per patient. She saw Dr Norleen Crease osteoporosis specialist from [facility] he recommended Tymlos SQ injections daily but was concerned with possible side effect of osteosarcoma and wanted her to check with you prior she also voiced he ordered lab work up for multiple myeloma.

## 2024-05-07 ENCOUNTER — Inpatient Hospital Stay: Admitting: Oncology

## 2024-05-07 ENCOUNTER — Inpatient Hospital Stay

## 2024-05-10 ENCOUNTER — Other Ambulatory Visit: Payer: Self-pay

## 2024-05-11 ENCOUNTER — Inpatient Hospital Stay: Attending: Hematology and Oncology

## 2024-05-11 ENCOUNTER — Telehealth: Payer: Self-pay | Admitting: Oncology

## 2024-05-11 ENCOUNTER — Encounter: Payer: Self-pay | Admitting: Oncology

## 2024-05-11 ENCOUNTER — Other Ambulatory Visit: Payer: Self-pay

## 2024-05-11 ENCOUNTER — Inpatient Hospital Stay: Admitting: Oncology

## 2024-05-11 VITALS — BP 132/84 | HR 77 | Temp 97.7°F | Resp 16 | Ht 60.0 in | Wt 134.1 lb

## 2024-05-11 DIAGNOSIS — C9110 Chronic lymphocytic leukemia of B-cell type not having achieved remission: Secondary | ICD-10-CM

## 2024-05-11 DIAGNOSIS — R7401 Elevation of levels of liver transaminase levels: Secondary | ICD-10-CM

## 2024-05-11 DIAGNOSIS — N6320 Unspecified lump in the left breast, unspecified quadrant: Secondary | ICD-10-CM | POA: Diagnosis not present

## 2024-05-11 DIAGNOSIS — N6310 Unspecified lump in the right breast, unspecified quadrant: Secondary | ICD-10-CM | POA: Insufficient documentation

## 2024-05-11 DIAGNOSIS — Z79899 Other long term (current) drug therapy: Secondary | ICD-10-CM | POA: Insufficient documentation

## 2024-05-11 DIAGNOSIS — C8592 Non-Hodgkin lymphoma, unspecified, intrathoracic lymph nodes: Secondary | ICD-10-CM | POA: Insufficient documentation

## 2024-05-11 DIAGNOSIS — C8302 Small cell B-cell lymphoma, intrathoracic lymph nodes: Secondary | ICD-10-CM

## 2024-05-11 LAB — CBC WITH DIFFERENTIAL (CANCER CENTER ONLY)
Abs Immature Granulocytes: 0.01 K/uL (ref 0.00–0.07)
Basophils Absolute: 0 K/uL (ref 0.0–0.1)
Basophils Relative: 0 %
Eosinophils Absolute: 0 K/uL (ref 0.0–0.5)
Eosinophils Relative: 0 %
HCT: 39.9 % (ref 36.0–46.0)
Hemoglobin: 13.6 g/dL (ref 12.0–15.0)
Immature Granulocytes: 0 %
Lymphocytes Relative: 9 %
Lymphs Abs: 0.3 K/uL — ABNORMAL LOW (ref 0.7–4.0)
MCH: 34 pg (ref 26.0–34.0)
MCHC: 34.1 g/dL (ref 30.0–36.0)
MCV: 99.8 fL (ref 80.0–100.0)
Monocytes Absolute: 0.5 K/uL (ref 0.1–1.0)
Monocytes Relative: 15 %
Neutro Abs: 2.5 K/uL (ref 1.7–7.7)
Neutrophils Relative %: 76 %
Platelet Count: 192 K/uL (ref 150–400)
RBC: 4 MIL/uL (ref 3.87–5.11)
RDW: 13 % (ref 11.5–15.5)
WBC Count: 3.4 K/uL — ABNORMAL LOW (ref 4.0–10.5)
nRBC: 0 % (ref 0.0–0.2)

## 2024-05-11 LAB — CMP (CANCER CENTER ONLY)
ALT: 64 U/L — ABNORMAL HIGH (ref 0–44)
AST: 42 U/L — ABNORMAL HIGH (ref 15–41)
Albumin: 4.5 g/dL (ref 3.5–5.0)
Alkaline Phosphatase: 68 U/L (ref 38–126)
Anion gap: 9 (ref 5–15)
BUN: 20 mg/dL (ref 8–23)
CO2: 30 mmol/L (ref 22–32)
Calcium: 9.5 mg/dL (ref 8.9–10.3)
Chloride: 99 mmol/L (ref 98–111)
Creatinine: 0.91 mg/dL (ref 0.44–1.00)
GFR, Estimated: >60 mL/min (ref >60)
Glucose, Bld: 98 mg/dL (ref 70–99)
Potassium: 4.2 mmol/L (ref 3.5–5.1)
Sodium: 137 mmol/L (ref 135–145)
Total Bilirubin: 0.4 mg/dL (ref 0.0–1.2)
Total Protein: 7 g/dL (ref 6.5–8.1)

## 2024-05-11 NOTE — Telephone Encounter (Signed)
 Patient has been scheduled for follow-up visit per 05/11/2024 LOS.  Pt aware of scheduled appt details.

## 2024-05-11 NOTE — Progress Notes (Signed)
 Specialty Pharmacy Refill Coordination Note  Dawn Prince is a 63 y.o. female contacted today regarding refills of specialty medication(s) Venetoclax  (VENCLEXTA )   Patient requested (Patient-Rptd) Delivery   Delivery date: 05/17/24   Verified address: (Patient-Rptd) [address] [place]  [phone]   Medication will be filled on: 05/14/24

## 2024-05-11 NOTE — Telephone Encounter (Signed)
 Patient spouse, Oneil called and stated that he was returning Dr  Marty call  Oneil would like to discuss labs and medicine in further detail if possible  Oneil can be reached [phone]

## 2024-05-11 NOTE — Progress Notes (Signed)
 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  She had been on observation alone for years.  She had progression of her lymphadenopathy by exam and by CT scan in May of 2025.  She has been taking oral chemotherapy with venetoclax  200 mg daily since June 16, after a slow titration. She initially had significant neutropenia. She has had a good response to treatment with decreasing lymphadenopathy and normalization of her hemoglobin and platelets.  She has had an increase in the transaminases, which have fluctuated up and down. Her fatigue has improved and the nausea is controlled with daily Compazine .   Abnormal transaminases Mild elevation of the transaminases.  We decreased the atorvastatin  to 40 mg daily. Her most recent lipid panel looked good, but the liver transaminases continue to fluctuate up and down. They worsened and so the atorvastatin  was stopped and she is now on Crestor  with good improvement of her liver function tests.    Breast mass in female Bilateral breast soft tissue masses right greater than left.  Bilateral diagnostic mammogram and breast ultrasounds were negative and did note interval decrease in size of bilateral axillary adenopathy as of September, 2025.   Plan: She continues Venetoclax  without difficulty. She informed me that Dr. Midge recommended she start Tymlos injections once monthly or Evenity injections twice monthly for her bones. In the meantime she was started on Fosamax 70mg  once weekly but has had some reflux with that. She has a WBC of 3.4 with an ANC of 2500 improved from 2.3 with an ANC of 1700, a normal hemoglobin of 13.6, and a normal platelet count of 192,000. Her CMP is fairly normal other  than an chronic elevated AST of 42 improved from 92 and ALT of 64 improved from 125. I will see her back in 1 month with CBC and CMP. The patient understands the plans discussed today and is in agreement with them.  She knows to contact our office if she develops concerns prior to her next appointment.  I provided 25 minutes of face-to-face time during this encounter and > 50% was spent counseling as documented under my assessment and plan.   Dawn VEAR Cornish, MD  [facility] [address] [place] [address] Dept: [phone] Dept Fax: [phone]   No orders of the defined types were placed in this encounter.  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  She had been on observation only. She has had stable bilateral cervical and inguinal lymphadenopathy.  MRI imaging from February 2023 revealed extensive inguinal and retroperitoneal lymphadenopathy.  Her physical exam reveals a modest change in her cervical and supraclavicular adenopathy. PET scan in March 2023 revealed continued stability of mild adenopathy in the neck, chest, abdomen and pelvis with low level FDG and Deauville 2-3 category uptake. CT imaging in September 2023 shows the numerous mildly enlarged nodes to be unchanged. Her lymphadenopathy is relatively stable at this time but just mildly increased. CT chest, abdomen and pelvis in June 2024 revealed stable  bilateral axillary and supraclavicular adenopathy, no mediastinal lymphadenopathy, with a new band of linear consolidation in the left upper lobe, felt to be post infectious consolidation. She has stable retroperitoneal and proximal iliac lymphadenopathy, and no new adenopathy in the abdomen and pelvis.  Spleen was  normal and no skeletal lesions.  She had progressive lymphadenopathy as well as mild anemia and thrombocytopenia in May, so was started on venetoclax  in June.  Venetoclax  was slowly titrated beginning with 20 mg daily for 1 week, 50 mg daily for 1 week, 100 mg daily for 1 week, and then 200 mg daily.  She has tolerated this fairly well with improvement in her lymphadenopathy, anemia and thrombocytopenia.  She has experienced severe fatigue and moderate nausea.   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)  11/11/2015 Cancer Staging   Staging form: Hodgkin and Non-Hodgkin Lymphoma, AJCC 8th Edition - Clinical stage from 11/11/2015: Stage III (Small lymphocytic leukemia) - Signed by Cornelius Dawn DEL, MD on 01/28/2021 Histopathologic type: Malignant lymphoma, small B lymphocytic, NOS (see also M-9823/3) Stage prefix: Initial diagnosis Diagnostic confirmation: Positive histology PLUS positive immunophenotyping and/or positive genetic studies Specimen type: Core Needle Biopsy Staged by: Managing physician Stage used in treatment planning: Yes National guidelines used in treatment planning: Yes Type of national guideline used in treatment planning: NCCN Staging comments: Watchful waiting   06/20/2020 Initial Diagnosis   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)   Chronic lymphocytic leukemia (CLL), B-cell (HCC)  10/31/2015 Initial Diagnosis   Chronic lymphocytic leukemia (CLL), B-cell (HCC)   11/11/2015 Cancer Staging   Staging form: Chronic Lymphocytic Leukemia / Small Lymphocytic Lymphoma, AJCC 8th Edition - Clinical stage from 11/11/2015: Modified Rai Stage III (Modified Rai risk: High, Binet: Stage B, Lugano: Stage III, Lymphocytosis: Absent, Adenopathy: Present, Organomegaly: Absent, Anemia: Absent, Thrombocytopenia: Absent) - Signed by Cornelius Dawn DEL, MD on 04/05/2021 Histopathologic type: B-cell lymphocytic leukemia/small  lymphocytic lymphoma (see also M-9670/3) Stage prefix: Initial diagnosis Stage used in treatment planning: Yes National guidelines used in treatment planning: Yes Type of national guideline used in treatment planning: NCCN     INTERVAL HISTORY:  Dawn Prince is here today for repeat clinical assessment of her CLL/SLL, now on treatment with Venetoclax . Patient states that she feels well but complains of chronic left hip and leg pain. She continues Venetoclax  without difficulty. She informed me that Dr. Midge recommended she start Tymlos injections once monthly or Evenity injections twice monthly for her bones. In the meantime she was started on Fosamax 70mg  once weekly but has had some reflux with that. She has a WBC of 3.4 with an ANC of 2500 improved from 2.3 with an ANC of 1700, a normal hemoglobin of 13.6, and a normal platelet count of 192,000. Her CMP is fairly normal other than an chronic elevated AST of 42 improved from 92 and ALT of 64 improved from 125. I will see her back in 1 month with CBC and CMP.   She denies fever, chills, night sweats, or other signs of infection. She denies cardiorespiratory and gastrointestinal issues. She  denies pain. Her appetite is very good and Her weight has increased 7 pounds over last month. This patient is accompanied in the office by her husband.   REVIEW OF SYSTEMS:  Review of Systems  Constitutional:  Positive for fatigue. Negative for appetite change, chills, fever and unexpected weight change.  HENT:  Negative.  Negative for lump/mass, mouth sores and sore throat.  Eyes: Negative.   Respiratory: Negative.  Negative for chest tightness, cough, hemoptysis, shortness of breath and wheezing.   Cardiovascular: Negative.  Negative for chest pain, leg swelling and palpitations.  Gastrointestinal: Negative.  Negative for abdominal distention, abdominal pain, blood in stool, constipation, diarrhea, nausea and vomiting.  Endocrine: Negative.   Genitourinary:  Negative.  Negative for difficulty urinating, dysuria, frequency and hematuria.   Musculoskeletal:  Positive for arthralgias (left hip/leg), back pain (lower) and gait problem (due to hip pain). Negative for flank pain, myalgias and neck pain.  Skin: Negative.  Negative for rash.  Neurological:  Positive for gait problem (due to hip pain). Negative for dizziness, extremity weakness, headaches, light-headedness, numbness, seizures and speech difficulty.  Hematological: Negative.  Negative for adenopathy. Does not bruise/bleed easily.  Psychiatric/Behavioral: Negative.  Negative for depression and sleep disturbance. The patient is not nervous/anxious.      VITALS:  Blood pressure 132/84, pulse 77, temperature 97.7 F (36.5 C), temperature source Oral, resp. rate 16, height 5' (1.524 m), weight 134 lb 1.6 oz (60.8 kg), SpO2 98%.  Wt Readings from Last 3 Encounters:  05/11/24 134 lb 1.6 oz (60.8 kg)  04/07/24 126 lb 11.2 oz (57.5 kg)  03/05/24 122 lb 3.2 oz (55.4 kg)    Body mass index is 26.19 kg/m.  Performance status (ECOG): 1 - Symptomatic but completely ambulatory  PHYSICAL EXAM:   Physical Exam Vitals and nursing note reviewed. Exam conducted with a chaperone present.  Constitutional:      General: She is not in acute distress.    Appearance: Normal appearance. She is normal weight. She is not ill-appearing, toxic-appearing or diaphoretic.  HENT:     Head: Normocephalic and atraumatic.     Right Ear: Tympanic membrane, ear canal and external ear normal. There is no impacted cerumen.     Left Ear: Tympanic membrane, ear canal and external ear normal. There is no impacted cerumen.     Nose: Nose normal. No congestion or rhinorrhea.     Mouth/Throat:     Mouth: Mucous membranes are moist.     Pharynx: Oropharynx is clear. No oropharyngeal exudate or posterior oropharyngeal erythema.  Eyes:     General: No scleral icterus.       Right eye: No discharge.        Left eye: No  discharge.     Extraocular Movements: Extraocular movements intact.     Conjunctiva/sclera: Conjunctivae normal.     Pupils: Pupils are equal, round, and reactive to light.  Cardiovascular:     Rate and Rhythm: Normal rate and regular rhythm.     Pulses: Normal pulses.     Heart sounds: Normal heart sounds. No murmur heard.    No friction rub. No gallop.  Pulmonary:     Effort: Pulmonary effort is normal. No respiratory distress.     Breath sounds: Normal breath sounds. No stridor. No wheezing, rhonchi or rales.  Chest:     Chest wall: No tenderness.  Abdominal:     General: Bowel sounds are normal. There is no distension.     Palpations: Abdomen is soft. There is no hepatomegaly, splenomegaly or mass.     Tenderness: There is no abdominal tenderness. There is no right CVA tenderness, left CVA tenderness, guarding or rebound.     Hernia: No hernia is present.  Musculoskeletal:        General: No swelling, tenderness, deformity or signs of injury. Normal range of motion.  Cervical back: Normal range of motion and neck supple. No tenderness.     Right lower leg: No edema.     Left lower leg: No edema.  Lymphadenopathy:     Cervical: No cervical adenopathy.     Right cervical: No superficial, deep or posterior cervical adenopathy.    Left cervical: No superficial, deep or posterior cervical adenopathy.     Upper Body:     Right upper body: No supraclavicular, axillary or pectoral adenopathy.     Left upper body: No supraclavicular, axillary or pectoral adenopathy.     Lower Body: No right inguinal adenopathy. No left inguinal adenopathy.     Comments: A tiny node less than a cm in the left supraclavicular area and slight fullness  Adenopathy in bilateral popliteal fosas  Skin:    General: Skin is warm and dry.     Coloration: Skin is not jaundiced or pale.     Findings: No bruising, erythema, lesion or rash.  Neurological:     General: No focal deficit present.     Mental  Status: She is alert and oriented to person, place, and time. Mental status is at baseline.     Cranial Nerves: No cranial nerve deficit.     Sensory: No sensory deficit.     Coordination: Coordination normal.     Gait: Gait normal.     Deep Tendon Reflexes: Reflexes normal.  Psychiatric:        Mood and Affect: Mood normal.        Behavior: Behavior normal.        Thought Content: Thought content normal.        Judgment: Judgment normal.     LABS:      Latest Ref Rng & Units 05/11/2024    2:21 PM 04/05/2024    8:37 AM 03/05/2024    8:03 AM  CBC  WBC 4.0 - 10.5 K/uL 3.4  2.3  3.4   Hemoglobin 12.0 - 15.0 g/dL 86.3  86.6  86.3   Hematocrit 36.0 - 46.0 % 39.9  39.8  40.8   Platelets 150 - 400 K/uL 192  201  183       Latest Ref Rng & Units 05/11/2024    2:21 PM 04/05/2024    8:37 AM 03/05/2024    8:03 AM  CMP  Glucose 70 - 99 mg/dL 98  95  98   BUN 8 - 23 mg/dL 20  13  17    Creatinine 0.44 - 1.00 mg/dL 9.08  9.20  9.19   Sodium 135 - 145 mmol/L 137  140  135   Potassium 3.5 - 5.1 mmol/L 4.2  4.2  4.7   Chloride 98 - 111 mmol/L 99  102  99   CO2 22 - 32 mmol/L 30  27  27    Calcium  8.9 - 10.3 mg/dL 9.5  9.1  9.1   Total Protein 6.5 - 8.1 g/dL 7.0  6.5  7.0   Total Bilirubin 0.0 - 1.2 mg/dL 0.4  0.4  0.6   Alkaline Phos 38 - 126 U/L 68  72  72   AST 15 - 41 U/L 42  92  66   ALT 0 - 44 U/L 64  125  107     Lab Results  Component Value Date   TIBC 322 12/12/2022   TIBC 331 07/23/2021   FERRITIN 19 12/12/2022   FERRITIN 22 07/23/2021   IRONPCTSAT 30 12/12/2022   IRONPCTSAT 20 07/23/2021  Lab Results  Component Value Date   LDH 163 10/15/2023   LDH 162 07/01/2023   LDH 136 10/30/2021    STUDIES:  EXAM: 04/07/2024 DUAL X-RAY ABSORPTIOMETRY (DXA) FOR BONE MINERAL DENSITY IMPRESSION: LEFT FEMORAL NECK: BMD (in g/cm2): 0.569 T-score: -3.4 Z-score: -1.9   LEFT TOTAL HIP: BMD (in g/cm2): 0.584 T-score: -3.4 Z-score: -2.1   RIGHT FEMORAL NECK: BMD (in  g/cm2): 0.517 T-score: -3.8 Z-score: -2.3   RIGHT TOTAL HIP: BMD (in g/cm2): 0.553 T-score: -3.6 Z-score: -2.4   LEFT FOREARM (RADIUS 33%): BMD (in g/cm2): 0.721 T-score: -1.8 Z-score: -0.3   EXAM: 02/03/2024 DIGITAL DIAGNOSTIC BILATERAL MAMMOGRAM WITH TOMOSYNTHESIS AND CAD; ULTRASOUND RIGHT BREAST LIMITED; ULTRASOUND LEFT BREAST LIMITED IMPRESSION: 1. No mammographic or sonographic evidence of malignancy the sites of palpable concern bilaterally Any further workup of the patient's symptoms should be based on the clinical assessment. Recommend routine annual screening mammogram in 1 year. 2. Interval decrease in size of bilateral axillary adenopathy, consistent with treatment response for known lymphoma. 3. No mammographic evidence of mammary malignancy bilaterally.   HISTORY:   Past Medical History:  Diagnosis Date   Allergy    seasonal   Arthritis    Bipolar 1 disorder (HCC)    Cancer (HCC)    non hodgkins lymphoma   Depression    GERD (gastroesophageal reflux disease)    History of degenerative disc disease    Hyperlipidemia    Increased risk of breast cancer 04/25/2021   Obstructive sleep apnea    Osteopenia    Scoliosis    Thyroid  disease    hypothyroidism   Urticaria    Vitamin D  deficiency     Past Surgical History:  Procedure Laterality Date   BREAST BIOPSY     BUBBLE STUDY  02/13/2021   Procedure: BUBBLE STUDY;  Surgeon: Raford Riggs, MD;  Location: [facility] ENDOSCOPY;  Service: Cardiovascular;;   CARPAL TUNNEL RELEASE Bilateral 2002   CESAREAN SECTION     x2   COLONOSCOPY  07/26/2008   Melanosis coli. Small internal hemorrhoids.    ENDOSCOPIC PLANTAR FASCIOTOMY     ESOPHAGOGASTRODUODENOSCOPY  03/17/2013   Mild gastritis. Status post esophageal dilatation.   LYMPH NODE BIOPSY     right hip repair torn tendon Right 09/2021   TEE WITHOUT CARDIOVERSION N/A 02/13/2021   Procedure: TRANSESOPHAGEAL ECHOCARDIOGRAM (TEE);  Surgeon: Raford Riggs, MD;   Location: [facility] ENDOSCOPY;  Service: Cardiovascular;  Laterality: N/A;   WISDOM TOOTH EXTRACTION      Family History  Problem Relation Age of Onset   Prostate cancer Father 12   Melanoma Father 59   High blood pressure Father    Breast cancer Maternal Aunt    Multiple myeloma Maternal Aunt    Breast cancer Maternal Aunt    Colon cancer Neg Hx    Colon polyps Neg Hx    Esophageal cancer Neg Hx    Rectal cancer Neg Hx    Stomach cancer Neg Hx     Social History:  reports that she has never smoked. She has never used smokeless tobacco. She reports that she does not currently use alcohol. She reports that she does not use drugs.The patient is accompanied by her husband today.  Allergies:  Allergies  Allergen Reactions   Diclofenac Sodium Other (See Comments)    Thought it had something to do with her having a stroke   Voltaren [Diclofenac Sodium]     Thought it had something to do with her having a stroke  Nsaids Other (See Comments)    Had a stroke and worries it was related to the Diclofenac she took.    Current Medications: Current Outpatient Medications  Medication Sig Dispense Refill   alendronate (FOSAMAX) 70 MG tablet Take 70 mg by mouth once a week. Take with a full glass of water on an empty stomach.     pregabalin (LYRICA) 25 MG capsule Take 25 mg by mouth. (Patient taking differently: Take 25 mg by mouth as needed. 2 at bedtime and 1 in morning as needed)     amitriptyline  (ELAVIL ) 10 MG tablet Take 10 mg by mouth at bedtime.     Ascorbic Acid (VITAMIN C) 1000 MG tablet Take 1,000 mg by mouth 2 (two) times daily.     cholecalciferol (VITAMIN D3) 25 MCG (1000 UNIT) tablet Take 1,000 Units by mouth daily.     estradiol (ESTRACE) 0.1 MG/GM vaginal cream 2 (two) times a week.     famotidine  (PEPCID ) 20 MG tablet Take 1 tablet (20 mg total) by mouth 2 (two) times daily. 60 tablet 0   FLUoxetine  HCl (PROZAC  PO) Take 60 mg by mouth daily.     hydrochlorothiazide  (HYDRODIURIL) 12.5 MG tablet Take 12.5 mg by mouth every morning.     Multiple Vitamin (MULTIVITAMIN ADULT PO) Take by mouth. Vision MD once a day     ondansetron  (ZOFRAN -ODT) 4 MG disintegrating tablet Take 1 tablet (4 mg total) by mouth every 8 (eight) hours as needed for nausea or vomiting. 20 tablet 2   PLAVIX  75 MG tablet Take 75 mg by mouth daily.     Probiotic Product (PROBIOTIC BLEND PO) Take by mouth at bedtime.     prochlorperazine  (COMPAZINE ) 10 MG tablet Take 1 tablet (10 mg total) by mouth every 6 (six) hours as needed for nausea or vomiting. 30 tablet 5   ramipril  (ALTACE ) 5 MG capsule Take 5 mg by mouth 2 (two) times daily.     venetoclax  (VENCLEXTA ) 100 MG tablet Take 2 tablets (200 mg total) by mouth daily. Tablets should be swallowed whole with a meal and a full glass of water. 60 tablet 5   No current facility-administered medications for this visit.    I,[name],acting as a scribe for Dawn VEAR Cornish, MD.,have documented all relevant documentation on the behalf of Dawn VEAR Cornish, MD,as directed by  Dawn VEAR Cornish, MD while in the presence of Dawn VEAR Cornish, MD.  I have reviewed this report as typed by the medical scribe, and it is complete and accurate

## 2024-05-14 ENCOUNTER — Other Ambulatory Visit: Payer: Self-pay

## 2024-05-17 ENCOUNTER — Encounter: Payer: Self-pay | Admitting: Oncology

## 2024-05-28 ENCOUNTER — Telehealth: Payer: Self-pay

## 2024-05-28 ENCOUNTER — Encounter: Payer: Self-pay | Admitting: Oncology

## 2024-05-28 ENCOUNTER — Other Ambulatory Visit (HOSPITAL_COMMUNITY): Payer: Self-pay

## 2024-05-28 NOTE — Telephone Encounter (Signed)
 Oral Oncology Patient Advocate Encounter  After completing a benefits investigation, prior authorization for Venclexta  100mg  is not required at this time through [facility].  Patient's copay is $2100.   She does have a grant to cover the copay cost   Lucie Lamer, CPhT [facility]  [facility] Specialty Pharmacy Services Oncology Pharmacy Patient Advocate Specialist [address] [place] Phone: [phone]  Fax: [phone] [email or web address]

## 2024-06-03 ENCOUNTER — Encounter: Payer: Self-pay | Admitting: Oncology

## 2024-06-04 ENCOUNTER — Other Ambulatory Visit: Payer: Self-pay

## 2024-06-04 ENCOUNTER — Other Ambulatory Visit (HOSPITAL_COMMUNITY): Payer: Self-pay

## 2024-06-04 NOTE — Progress Notes (Signed)
 Specialty Pharmacy Refill Coordination Note  Dawn Prince is a 64 y.o. female contacted today regarding refills of specialty medication(s) Venetoclax  (VENCLEXTA )   Patient requested Delivery   Delivery date: 06/11/24   Verified address: [address] [place]  [address]   Medication will be filled on: 06/10/24

## 2024-06-08 ENCOUNTER — Other Ambulatory Visit: Payer: Self-pay | Admitting: Hematology and Oncology

## 2024-06-08 DIAGNOSIS — R112 Nausea with vomiting, unspecified: Secondary | ICD-10-CM

## 2024-06-10 ENCOUNTER — Other Ambulatory Visit: Payer: Self-pay | Admitting: Oncology

## 2024-06-10 ENCOUNTER — Encounter (INDEPENDENT_AMBULATORY_CARE_PROVIDER_SITE_OTHER): Payer: Self-pay

## 2024-06-10 ENCOUNTER — Encounter: Payer: Self-pay | Admitting: Oncology

## 2024-06-10 ENCOUNTER — Other Ambulatory Visit: Payer: Self-pay

## 2024-06-10 ENCOUNTER — Telehealth: Payer: Self-pay | Admitting: Oncology

## 2024-06-10 ENCOUNTER — Inpatient Hospital Stay: Attending: Hematology and Oncology | Admitting: Oncology

## 2024-06-10 ENCOUNTER — Inpatient Hospital Stay

## 2024-06-10 ENCOUNTER — Other Ambulatory Visit (HOSPITAL_COMMUNITY): Payer: Self-pay

## 2024-06-10 ENCOUNTER — Telehealth: Payer: Self-pay | Admitting: Adult Health

## 2024-06-10 VITALS — BP 108/83 | HR 83 | Temp 97.4°F | Resp 16 | Ht 60.0 in | Wt 137.8 lb

## 2024-06-10 DIAGNOSIS — C9110 Chronic lymphocytic leukemia of B-cell type not having achieved remission: Secondary | ICD-10-CM

## 2024-06-10 DIAGNOSIS — C8302 Small cell B-cell lymphoma, intrathoracic lymph nodes: Secondary | ICD-10-CM

## 2024-06-10 LAB — CBC WITH DIFFERENTIAL (CANCER CENTER ONLY)
Abs Immature Granulocytes: 0.01 K/uL (ref 0.00–0.07)
Basophils Absolute: 0 K/uL (ref 0.0–0.1)
Basophils Relative: 0 %
Eosinophils Absolute: 0 K/uL (ref 0.0–0.5)
Eosinophils Relative: 0 %
HCT: 39.2 % (ref 36.0–46.0)
Hemoglobin: 13.4 g/dL (ref 12.0–15.0)
Immature Granulocytes: 0 %
Lymphocytes Relative: 9 %
Lymphs Abs: 0.3 K/uL — ABNORMAL LOW (ref 0.7–4.0)
MCH: 33.5 pg (ref 26.0–34.0)
MCHC: 34.2 g/dL (ref 30.0–36.0)
MCV: 98 fL (ref 80.0–100.0)
Monocytes Absolute: 0.6 K/uL (ref 0.1–1.0)
Monocytes Relative: 17 %
Neutro Abs: 2.6 K/uL (ref 1.7–7.7)
Neutrophils Relative %: 74 %
Platelet Count: 200 K/uL (ref 150–400)
RBC: 4 MIL/uL (ref 3.87–5.11)
RDW: 12.8 % (ref 11.5–15.5)
WBC Count: 3.5 K/uL — ABNORMAL LOW (ref 4.0–10.5)
nRBC: 0 % (ref 0.0–0.2)

## 2024-06-10 LAB — CMP (CANCER CENTER ONLY)
ALT: 68 U/L — ABNORMAL HIGH (ref 0–44)
AST: 50 U/L — ABNORMAL HIGH (ref 15–41)
Albumin: 4.3 g/dL (ref 3.5–5.0)
Alkaline Phosphatase: 58 U/L (ref 38–126)
Anion gap: 10 (ref 5–15)
BUN: 19 mg/dL (ref 8–23)
CO2: 29 mmol/L (ref 22–32)
Calcium: 9.7 mg/dL (ref 8.9–10.3)
Chloride: 98 mmol/L (ref 98–111)
Creatinine: 1.18 mg/dL — ABNORMAL HIGH (ref 0.44–1.00)
GFR, Estimated: 51 mL/min — ABNORMAL LOW
Glucose, Bld: 108 mg/dL — ABNORMAL HIGH (ref 70–99)
Potassium: 3.8 mmol/L (ref 3.5–5.1)
Sodium: 136 mmol/L (ref 135–145)
Total Bilirubin: 0.4 mg/dL (ref 0.0–1.2)
Total Protein: 6.9 g/dL (ref 6.5–8.1)

## 2024-06-10 NOTE — Telephone Encounter (Signed)
 Patient has been scheduled for follow-up visit per 06/10/2024 LOS.  Pt given an appt calendar with date and time.

## 2024-06-10 NOTE — Telephone Encounter (Signed)
 Patient is no longer on atorvastatin .  Per chart review, it looks like oncology discontinued and replaced with Crestor .  Please advise pharmacy.

## 2024-06-10 NOTE — Telephone Encounter (Signed)
 [facility] (Dawn Prince) request refill for Atorvastatin  80 mg

## 2024-06-10 NOTE — Progress Notes (Signed)
 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  She had been on observation alone for years.  She had progression of her lymphadenopathy by exam and by CT scan in May of 2025.  She has been taking oral chemotherapy with venetoclax  200 mg daily since June 16, after a slow titration. She initially had significant neutropenia. She has had a good response to treatment with decreasing lymphadenopathy and normalization of her hemoglobin and platelets.  She has had an increase in the transaminases, which have fluctuated up and down. Her fatigue has improved and the nausea is controlled with daily Compazine .   Abnormal transaminases Mild elevation of the transaminases.  We decreased the atorvastatin  to 40 mg daily. Her most recent lipid panel looked good, but the liver transaminases continue to fluctuate up and down. They worsened and so the atorvastatin  was stopped and she is now on Crestor  with good improvement of her liver function tests.   Elevation of creatinine This is a new finding but mild at 1.18. This is not reported with Tymlos injections but I have no explanation for this. For now, we will monitor it monthly. She is not on any other medications that are likely to affect her renal function.   Breast mass in female Bilateral breast soft tissue masses right greater than left.  Bilateral diagnostic mammogram and breast ultrasounds were negative and did note interval decrease in size of bilateral axillary adenopathy as of September, 2025.   Plan: She complains of chronic left hip pain, back pain when standing, and leg numbness which is relieved by laying down. She began 80 mcg Tymlos injections daily following Dr. Arie  recommendation and reports several side effects that begin 3 hours post-injection including fatigue, palpitations, elevated heart rate, headaches, and feeling flushed. She is going to give it more time to see if her body adjusts to these injections. She is trying to take compazine  and Tylenol  before the injections and this seems to help. She continues Venetoclax  without difficulty. She continues hydrochlorothiazide and ramipril  for blood pressure. She has a low WBC of 3.5 with an ANC of 2600 improved from 3.4, hemoglobin of 13.4, and platelet count of 200,000. Her CMP reveals an elevated creatinine of 1.18 up from 0.91, an elevated AST of 50 up from 42, an elevated ALT of 68 up from 64, and a low eGFR of 51 down from >60. I will see her back in 1 month with CBC and CMP. The patient understands the plans discussed today and is in agreement with them.  She knows to contact our office if she develops concerns prior to her next appointment.  I provided 12 minutes of face-to-face time during this encounter and > 50% was spent counseling as documented under my assessment and plan.   [name], MD  [facility] A DEPT OF [facility] [address] [place] [address] Dept: [phone] Dept Fax: [phone]   No orders of the defined types were placed in this encounter.  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  She had been on observation only. She has had stable bilateral cervical and inguinal lymphadenopathy.  MRI imaging from February 2023 revealed extensive inguinal and retroperitoneal lymphadenopathy.  Her physical exam reveals a modest change in her cervical and supraclavicular adenopathy. PET scan in March 2023 revealed continued stability of mild  adenopathy in the neck, chest, abdomen and pelvis with low level FDG and Deauville 2-3 category uptake. CT imaging in September 2023 shows the numerous mildly enlarged nodes to be unchanged. Her lymphadenopathy is relatively stable at this time but just mildly increased. CT chest, abdomen and pelvis in June 2024 revealed stable bilateral axillary and supraclavicular adenopathy, no mediastinal lymphadenopathy, with a new band of linear consolidation in the left upper lobe, felt to be post infectious consolidation. She has stable retroperitoneal and proximal iliac lymphadenopathy, and no new adenopathy in the abdomen and pelvis.  Spleen was normal and no skeletal lesions.  She had progressive lymphadenopathy as well as mild anemia and thrombocytopenia in May, so was started on venetoclax  in June.  Venetoclax  was slowly titrated beginning with 20 mg daily for 1 week, 50 mg daily for 1 week, 100 mg daily for 1 week, and then 200 mg daily.  She has tolerated this fairly well with improvement in her lymphadenopathy, anemia and thrombocytopenia.  She has experienced severe fatigue and moderate nausea.   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)  11/11/2015 Cancer Staging   Staging form: Hodgkin and Non-Hodgkin Lymphoma, AJCC 8th Edition - Clinical stage from 11/11/2015: Stage III (Small lymphocytic leukemia) - Signed by Cornelius Wanda DEL, MD on 01/28/2021 Histopathologic type: Malignant lymphoma, small B lymphocytic, NOS (see also M-9823/3) Stage prefix: Initial diagnosis Diagnostic confirmation: Positive histology PLUS positive immunophenotyping and/or positive genetic studies Specimen type: Core Needle Biopsy Staged by: Managing physician Stage used in treatment planning: Yes National guidelines used in treatment planning: Yes Type of national guideline used in treatment planning: NCCN Staging comments: Watchful waiting   06/20/2020 Initial Diagnosis   Small cell  B-cell lymphoma of intrathoracic lymph nodes (HCC)   Chronic lymphocytic leukemia (CLL), B-cell (HCC)  10/31/2015 Initial Diagnosis   Chronic lymphocytic leukemia (CLL), B-cell (HCC)   11/11/2015 Cancer Staging   Staging form: Chronic Lymphocytic Leukemia / Small Lymphocytic Lymphoma, AJCC 8th Edition - Clinical stage from 11/11/2015: Modified Rai Stage III (Modified Rai risk: High, Binet: Stage B, Lugano: Stage III, Lymphocytosis: Absent, Adenopathy: Present, Organomegaly: Absent, Anemia: Absent, Thrombocytopenia: Absent) - Signed by Cornelius Wanda DEL, MD on 04/05/2021 Histopathologic type: B-cell lymphocytic leukemia/small lymphocytic lymphoma (see also M-9670/3) Stage prefix: Initial diagnosis Stage used in treatment planning: Yes National guidelines used in treatment planning: Yes Type of national guideline used in treatment planning: NCCN     INTERVAL HISTORY:  Emmalynn is here today for repeat clinical assessment of her CLL/SLL, now on treatment with Venetoclax  since June of 2025. Patient states that she feels okay, but complains of chronic left hip pain, back pain when standing, and leg numbness which is relieved by laying down. She began 80 mcg Tymlos injections following Dr. Arie recommendation and reports several side effects that begin 3 hours post-injection including fatigue, palpitations, elevated heart rate, headaches, and feeling flushed. She is going to give it more time to see if her body adjusts to these injections. She is trying to take compazine  and Tylenol  before the injections and this seems to help. She continues Venetoclax  without difficulty. She continues hydrochlorothiazide and ramipril  for  blood pressure. She has a low WBC of 3.5 with an ANC of 2600 improved from 3.4 with an ANC of 2500, hemoglobin of 13.4, and platelet count of 200,000. Her CMP reveals an elevated creatinine of 1.18 up from 0.91, an elevated AST of 50 up from 42, an elevated ALT of 68 up from 64, and a  low eGFR of 51 down from >60. I will see her back in 1 month with CBC and CMP. She denies fever, chills, night sweats, or other signs of infection. She denies cardiorespiratory and gastrointestinal issues. Her appetite is good and Her weight has increased 3 pounds over last 4 weeks. She is accompanied by her husband.  REVIEW OF SYSTEMS:  Review of Systems  Constitutional:  Positive for fatigue (worsened). Negative for appetite change, chills, fever and unexpected weight change.  HENT:  Negative.  Negative for lump/mass, mouth sores and sore throat.   Eyes: Negative.   Respiratory: Negative.  Negative for chest tightness, cough, hemoptysis, shortness of breath and wheezing.   Cardiovascular:  Positive for palpitations (after Tymlos injections). Negative for chest pain and leg swelling.  Gastrointestinal: Negative.  Negative for abdominal distention, abdominal pain, blood in stool, constipation, diarrhea, nausea and vomiting.  Endocrine: Negative.   Genitourinary: Negative.  Negative for difficulty urinating, dysuria, frequency and hematuria.   Musculoskeletal:  Positive for arthralgias (left hip/leg), back pain (lower, especially when standing) and gait problem (due to hip pain). Negative for flank pain, myalgias and neck pain.  Skin: Negative.  Negative for rash.  Neurological:  Positive for gait problem (due to hip pain), headaches and numbness (legs). Negative for dizziness, extremity weakness, light-headedness, seizures and speech difficulty.  Hematological: Negative.  Negative for adenopathy. Does not bruise/bleed easily.  Psychiatric/Behavioral: Negative.  Negative for depression and sleep disturbance. The patient is not nervous/anxious.     VITALS:  Blood pressure 108/83, pulse 83, temperature (!) 97.4 F (36.3 C), temperature source Oral, resp. rate 16, height 5' (1.524 m), weight 137 lb 12.8 oz (62.5 kg), SpO2 99%.  Wt Readings from Last 3 Encounters:  06/10/24 137 lb 12.8 oz (62.5 kg)   05/11/24 134 lb 1.6 oz (60.8 kg)  04/07/24 126 lb 11.2 oz (57.5 kg)    Body mass index is 26.91 kg/m.  Performance status (ECOG): 1 - Symptomatic but completely ambulatory  PHYSICAL EXAM:   Physical Exam Vitals and nursing note reviewed. Exam conducted with a chaperone present.  Constitutional:      General: She is not in acute distress.    Appearance: Normal appearance. She is normal weight. She is not ill-appearing, toxic-appearing or diaphoretic.  HENT:     Head: Normocephalic and atraumatic.     Right Ear: Tympanic membrane, ear canal and external ear normal. There is no impacted cerumen.     Left Ear: Tympanic membrane, ear canal and external ear normal. There is no impacted cerumen.     Nose: Nose normal. No congestion or rhinorrhea.     Mouth/Throat:     Mouth: Mucous membranes are moist.     Pharynx: Oropharynx is clear. No oropharyngeal exudate or posterior oropharyngeal erythema.  Eyes:     General: No scleral icterus.       Right eye: No discharge.        Left eye: No discharge.     Extraocular Movements: Extraocular movements intact.     Conjunctiva/sclera: Conjunctivae normal.     Pupils: Pupils are equal, round, and reactive to light.  Cardiovascular:  Rate and Rhythm: Normal rate and regular rhythm.     Pulses: Normal pulses.     Heart sounds: Normal heart sounds. No murmur heard.    No friction rub. No gallop.  Pulmonary:     Effort: Pulmonary effort is normal. No respiratory distress.     Breath sounds: Normal breath sounds. No stridor. No wheezing, rhonchi or rales.  Chest:     Chest wall: No tenderness.  Abdominal:     General: Bowel sounds are normal. There is no distension.     Palpations: Abdomen is soft. There is no hepatomegaly, splenomegaly or mass.     Tenderness: There is no abdominal tenderness. There is no right CVA tenderness, left CVA tenderness, guarding or rebound.     Hernia: No hernia is present.  Musculoskeletal:        General:  No swelling, tenderness, deformity or signs of injury. Normal range of motion.     Cervical back: Normal range of motion and neck supple. No tenderness.     Right lower leg: No edema.     Left lower leg: No edema.  Lymphadenopathy:     Cervical: No cervical adenopathy.     Right cervical: No superficial, deep or posterior cervical adenopathy.    Left cervical: No superficial, deep or posterior cervical adenopathy.     Upper Body:     Right upper body: No supraclavicular, axillary or pectoral adenopathy.     Left upper body: No supraclavicular, axillary or pectoral adenopathy.     Lower Body: No right inguinal adenopathy. No left inguinal adenopathy.     Comments: Slight firmness in the left supraclavicular area but cannot feel discrete lymph nodes. A little firmness in the right axilla.  Small nodes in bilateral popliteal fossae.  Skin:    General: Skin is warm and dry.     Coloration: Skin is not jaundiced or pale.     Findings: No bruising, erythema, lesion or rash.  Neurological:     General: No focal deficit present.     Mental Status: She is alert and oriented to person, place, and time. Mental status is at baseline.     Cranial Nerves: No cranial nerve deficit.     Sensory: No sensory deficit.     Coordination: Coordination normal.     Gait: Gait normal.     Deep Tendon Reflexes: Reflexes normal.  Psychiatric:        Mood and Affect: Mood normal.        Behavior: Behavior normal.        Thought Content: Thought content normal.        Judgment: Judgment normal.    LABS:      Latest Ref Rng & Units 06/10/2024    3:22 PM 05/11/2024    2:21 PM 04/05/2024    8:37 AM  CBC  WBC 4.0 - 10.5 K/uL 3.5  3.4  2.3   Hemoglobin 12.0 - 15.0 g/dL 86.5  86.3  86.6   Hematocrit 36.0 - 46.0 % 39.2  39.9  39.8   Platelets 150 - 400 K/uL 200  192  201       Latest Ref Rng & Units 06/10/2024    3:22 PM 05/11/2024    2:21 PM 04/05/2024    8:37 AM  CMP  Glucose 70 - 99 mg/dL 891  98   95   BUN 8 - 23 mg/dL 19  20  13    Creatinine 0.44 - 1.00 mg/dL  1.18  0.91  0.79   Sodium 135 - 145 mmol/L 136  137  140   Potassium 3.5 - 5.1 mmol/L 3.8  4.2  4.2   Chloride 98 - 111 mmol/L 98  99  102   CO2 22 - 32 mmol/L 29  30  27    Calcium  8.9 - 10.3 mg/dL 9.7  9.5  9.1   Total Protein 6.5 - 8.1 g/dL 6.9  7.0  6.5   Total Bilirubin 0.0 - 1.2 mg/dL 0.4  0.4  0.4   Alkaline Phos 38 - 126 U/L 58  68  72   AST 15 - 41 U/L 50  42  92   ALT 0 - 44 U/L 68  64  125     Lab Results  Component Value Date   TIBC 322 12/12/2022   TIBC 331 07/23/2021   FERRITIN 19 12/12/2022   FERRITIN 22 07/23/2021   IRONPCTSAT 30 12/12/2022   IRONPCTSAT 20 07/23/2021   Lab Results  Component Value Date   LDH 163 10/15/2023   LDH 162 07/01/2023   LDH 136 10/30/2021    STUDIES:  EXAM: 04/07/2024 DUAL X-RAY ABSORPTIOMETRY (DXA) FOR BONE MINERAL DENSITY IMPRESSION: LEFT FEMORAL NECK: BMD (in g/cm2): 0.569 T-score: -3.4 Z-score: -1.9   LEFT TOTAL HIP: BMD (in g/cm2): 0.584 T-score: -3.4 Z-score: -2.1   RIGHT FEMORAL NECK: BMD (in g/cm2): 0.517 T-score: -3.8 Z-score: -2.3   RIGHT TOTAL HIP: BMD (in g/cm2): 0.553 T-score: -3.6 Z-score: -2.4   LEFT FOREARM (RADIUS 33%): BMD (in g/cm2): 0.721 T-score: -1.8 Z-score: -0.3   EXAM: 02/03/2024 DIGITAL DIAGNOSTIC BILATERAL MAMMOGRAM WITH TOMOSYNTHESIS AND CAD; ULTRASOUND RIGHT BREAST LIMITED; ULTRASOUND LEFT BREAST LIMITED IMPRESSION: 1. No mammographic or sonographic evidence of malignancy the sites of palpable concern bilaterally Any further workup of the patient's symptoms should be based on the clinical assessment. Recommend routine annual screening mammogram in 1 year. 2. Interval decrease in size of bilateral axillary adenopathy, consistent with treatment response for known lymphoma. 3. No mammographic evidence of mammary malignancy bilaterally.   HISTORY:   Past Medical History:  Diagnosis Date   Allergy    seasonal    Arthritis    Bipolar 1 disorder (HCC)    Cancer (HCC)    non hodgkins lymphoma   Depression    GERD (gastroesophageal reflux disease)    History of degenerative disc disease    Hyperlipidemia    Increased risk of breast cancer 04/25/2021   Obstructive sleep apnea    Osteopenia    Scoliosis    Thyroid  disease    hypothyroidism   Urticaria    Vitamin D  deficiency     Past Surgical History:  Procedure Laterality Date   BREAST BIOPSY     BUBBLE STUDY  02/13/2021   Procedure: BUBBLE STUDY;  Surgeon: Raford Riggs, MD;  Location: [facility] ENDOSCOPY;  Service: Cardiovascular;;   CARPAL TUNNEL RELEASE Bilateral 2002   CESAREAN SECTION     x2   COLONOSCOPY  07/26/2008   Melanosis coli. Small internal hemorrhoids.    ENDOSCOPIC PLANTAR FASCIOTOMY     ESOPHAGOGASTRODUODENOSCOPY  03/17/2013   Mild gastritis. Status post esophageal dilatation.   LYMPH NODE BIOPSY     right hip repair torn tendon Right 09/2021   TEE WITHOUT CARDIOVERSION N/A 02/13/2021   Procedure: TRANSESOPHAGEAL ECHOCARDIOGRAM (TEE);  Surgeon: Raford Riggs, MD;  Location: [facility] ENDOSCOPY;  Service: Cardiovascular;  Laterality: N/A;   WISDOM TOOTH EXTRACTION      Family History  Problem Relation Age of Onset   Prostate cancer Father 80   Melanoma Father 27   High blood pressure Father    Breast cancer Maternal Aunt    Multiple myeloma Maternal Aunt    Breast cancer Maternal Aunt    Colon cancer Neg Hx    Colon polyps Neg Hx    Esophageal cancer Neg Hx    Rectal cancer Neg Hx    Stomach cancer Neg Hx     Social History:  reports that she has never smoked. She has never used smokeless tobacco. She reports that she does not currently use alcohol. She reports that she does not use drugs.The patient is accompanied by her husband today.  Allergies:  Allergies  Allergen Reactions   Diclofenac Sodium Other (See Comments)    Thought it had something to do with her having a stroke   Voltaren [Diclofenac Sodium]      Thought it had something to do with her having a stroke   Nsaids Other (See Comments)    Had a stroke and worries it was related to the Diclofenac she took.    Current Medications: Current Outpatient Medications  Medication Sig Dispense Refill   TYMLOS 3120 MCG/1.56ML SOPN Inject 80 mcg into the skin.     amitriptyline  (ELAVIL ) 10 MG tablet Take 10 mg by mouth at bedtime.     cholecalciferol (VITAMIN D3) 25 MCG (1000 UNIT) tablet Take 1,000 Units by mouth daily.     estradiol (ESTRACE) 0.1 MG/GM vaginal cream 2 (two) times a week.     famotidine  (PEPCID ) 20 MG tablet Take 1 tablet (20 mg total) by mouth 2 (two) times daily. 60 tablet 0   FLUoxetine  HCl (PROZAC  PO) Take 60 mg by mouth daily.     hydrochlorothiazide (HYDRODIURIL) 12.5 MG tablet Take 12.5 mg by mouth every morning.     Multiple Vitamin (MULTIVITAMIN ADULT PO) Take by mouth. Vision MD once a day     ondansetron  (ZOFRAN -ODT) 4 MG disintegrating tablet Take 1 tablet (4 mg total) by mouth every 8 (eight) hours as needed for nausea or vomiting. 20 tablet 2   PLAVIX  75 MG tablet Take 75 mg by mouth daily.     pregabalin (LYRICA) 25 MG capsule Take 25 mg by mouth. (Patient taking differently: Take 25 mg by mouth as needed. 2 at bedtime and 1 in morning as needed)     Probiotic Product (PROBIOTIC BLEND PO) Take by mouth at bedtime.     prochlorperazine  (COMPAZINE ) 10 MG tablet Take 1 tablet (10 mg total) by mouth every 6 (six) hours as needed for nausea or vomiting. 30 tablet 5   ramipril  (ALTACE ) 5 MG capsule Take 5 mg by mouth 2 (two) times daily.     venetoclax  (VENCLEXTA ) 100 MG tablet Take 2 tablets (200 mg total) by mouth daily. Tablets should be swallowed whole with a meal and a full glass of water. 60 tablet 5   No current facility-administered medications for this visit.    I,Amalee Olsen H Travor Royce,acting as a scribe for [name], MD.,have documented all relevant documentation on the behalf of [name], MD,as directed by  [name], MD while in the presence of [name], MD.  I have reviewed this report as typed by the medical scribe, and it is complete and accurate   "

## 2024-06-10 NOTE — Telephone Encounter (Signed)
 [facility] is requesting a refill for this medication. It doesn't appear that you've ever prescribed this medication before, but she is on the medication. Patients last office visit was 01/07/2024, with an upcoming appointment on 08/17/2024. Theres no way to see when the last time patient picked the medication up.  Is this something that you are wanting to fill or do you want PCP to fill?

## 2024-06-10 NOTE — Telephone Encounter (Signed)
 Medication is scheduled to ship on 06/10/24, for the patient to receive on 06/11/24.

## 2024-06-10 NOTE — Telephone Encounter (Signed)
 Called [facility] and informed them, pharmacy stated they would discontinue the medication and make a note of it.

## 2024-06-16 ENCOUNTER — Encounter: Payer: Self-pay | Admitting: Oncology

## 2024-06-22 ENCOUNTER — Encounter: Payer: Self-pay | Admitting: Oncology

## 2024-06-29 ENCOUNTER — Other Ambulatory Visit: Payer: Self-pay

## 2024-06-29 NOTE — Progress Notes (Signed)
 Specialty Pharmacy Ongoing Clinical Assessment Note  Dawn Prince is a 64 y.o. female who is being followed by the specialty pharmacy service for RxSp Oncology   Patient's specialty medication(s) reviewed today: Venetoclax  (VENCLEXTA )   Missed doses in the last 4 weeks: 0   Patient/Caregiver did not have any additional questions or concerns.   Therapeutic benefit summary: Patient is achieving benefit   Adverse events/side effects summary: Experienced adverse events/side effects (fatigue, tolerable with naps)   Patient's therapy is appropriate to: Continue    Goals Addressed             This Visit's Progress    Maintain optimal adherence to therapy   On track    Patient is on track. Patient will maintain adherence         Follow up: 3 months  Silvano LOISE Dolly Specialty Pharmacist

## 2024-07-14 ENCOUNTER — Inpatient Hospital Stay: Admitting: Oncology

## 2024-07-14 ENCOUNTER — Inpatient Hospital Stay

## 2024-08-17 ENCOUNTER — Ambulatory Visit: Admitting: Adult Health
